# Patient Record
Sex: Female | Born: 1955
Health system: Southern US, Community
[De-identification: ages and names within clinical notes are randomized; demographics above are authoritative.]

## PROBLEM LIST (undated history)

## (undated) DIAGNOSIS — K8020 Calculus of gallbladder without cholecystitis without obstruction: Secondary | ICD-10-CM

## (undated) DIAGNOSIS — I10 Essential (primary) hypertension: Secondary | ICD-10-CM

## (undated) DIAGNOSIS — F329 Major depressive disorder, single episode, unspecified: Secondary | ICD-10-CM

## (undated) DIAGNOSIS — E785 Hyperlipidemia, unspecified: Secondary | ICD-10-CM

## (undated) DIAGNOSIS — I639 Cerebral infarction, unspecified: Secondary | ICD-10-CM

## (undated) DIAGNOSIS — G43909 Migraine, unspecified, not intractable, without status migrainosus: Secondary | ICD-10-CM

## (undated) DIAGNOSIS — E119 Type 2 diabetes mellitus without complications: Secondary | ICD-10-CM

## (undated) DIAGNOSIS — F32A Depression, unspecified: Secondary | ICD-10-CM

## (undated) HISTORY — DX: Type 2 diabetes mellitus without complications: E11.9

## (undated) HISTORY — DX: Migraine, unspecified, not intractable, without status migrainosus: G43.909

## (undated) HISTORY — DX: Hyperlipidemia, unspecified: E78.5

---

## 2009-01-17 ENCOUNTER — Other Ambulatory Visit: Admission: RE | Admit: 2009-01-17 | Discharge: 2009-01-17 | Payer: Self-pay | Admitting: Family Medicine

## 2013-04-06 DIAGNOSIS — I639 Cerebral infarction, unspecified: Secondary | ICD-10-CM

## 2013-04-06 HISTORY — DX: Cerebral infarction, unspecified: I63.9

## 2013-06-06 ENCOUNTER — Ambulatory Visit
Admission: RE | Admit: 2013-06-06 | Discharge: 2013-06-06 | Disposition: A | Discharge status: Home / Self Care | Payer: BC Managed Care – PPO | Source: Ambulatory Visit | Attending: Family Medicine | Admitting: Family Medicine

## 2013-06-06 ENCOUNTER — Other Ambulatory Visit: Payer: Self-pay | Admitting: Family Medicine

## 2013-06-06 ENCOUNTER — Other Ambulatory Visit (HOSPITAL_COMMUNITY): Payer: Self-pay | Admitting: Family Medicine

## 2013-06-06 DIAGNOSIS — I639 Cerebral infarction, unspecified: Secondary | ICD-10-CM

## 2013-06-06 DIAGNOSIS — I6350 Cerebral infarction due to unspecified occlusion or stenosis of unspecified cerebral artery: Secondary | ICD-10-CM

## 2013-06-06 DIAGNOSIS — R930 Abnormal findings on diagnostic imaging of skull and head, not elsewhere classified: Secondary | ICD-10-CM

## 2013-06-06 MED ORDER — GADOBENATE DIMEGLUMINE 529 MG/ML IV SOLN
12.0000 mL | Freq: Once | INTRAVENOUS | Status: AC | PRN
  Administered 2013-06-06: 12 mL via INTRAVENOUS

## 2013-06-07 ENCOUNTER — Encounter (INDEPENDENT_AMBULATORY_CARE_PROVIDER_SITE_OTHER): Payer: Self-pay

## 2013-06-07 ENCOUNTER — Ambulatory Visit (HOSPITAL_COMMUNITY): Payer: BC Managed Care – PPO

## 2013-06-07 ENCOUNTER — Ambulatory Visit (INDEPENDENT_AMBULATORY_CARE_PROVIDER_SITE_OTHER): Payer: BC Managed Care – PPO | Admitting: Neurology

## 2013-06-07 ENCOUNTER — Encounter: Payer: Self-pay | Admitting: Neurology

## 2013-06-07 ENCOUNTER — Encounter (INDEPENDENT_AMBULATORY_CARE_PROVIDER_SITE_OTHER): Payer: Self-pay | Admitting: Ophthalmology

## 2013-06-07 VITALS — BP 181/93 | HR 93 | Ht 60.0 in | Wt 135.0 lb

## 2013-06-07 DIAGNOSIS — G43909 Migraine, unspecified, not intractable, without status migrainosus: Secondary | ICD-10-CM | POA: Insufficient documentation

## 2013-06-07 DIAGNOSIS — E11319 Type 2 diabetes mellitus with unspecified diabetic retinopathy without macular edema: Secondary | ICD-10-CM | POA: Insufficient documentation

## 2013-06-07 DIAGNOSIS — I10 Essential (primary) hypertension: Secondary | ICD-10-CM

## 2013-06-07 DIAGNOSIS — E785 Hyperlipidemia, unspecified: Secondary | ICD-10-CM | POA: Insufficient documentation

## 2013-06-07 DIAGNOSIS — E119 Type 2 diabetes mellitus without complications: Secondary | ICD-10-CM

## 2013-06-07 MED ORDER — ATORVASTATIN CALCIUM 20 MG PO TABS: 20.0000 mg | ORAL_TABLET | Freq: Every day | ORAL | Status: DC

## 2013-06-07 MED ORDER — LISINOPRIL 5 MG PO TABS: 5.0000 mg | ORAL_TABLET | Freq: Every day | ORAL | Status: DC

## 2013-06-07 MED ORDER — ASPIRIN EC 81 MG PO TBEC: 81.0000 mg | DELAYED_RELEASE_TABLET | Freq: Every day | ORAL | Status: DC

## 2013-06-07 NOTE — Progress Notes (Signed)
PATIENT: Marie [name] DOB: Mar 05, 1956  HISTORICAL  Marie [name] is a right-handed 58 year old Asian female, accompanied by her husband, referred by her primary care physician Dr. Lanora Manis Barns for evaluation of acute onset language difficulty  In Feb 28th 2015, she was upset, while talking to her husbnad, she had sudden onset of difficulty speakings, speech was slurred, word finding difficulty, much improved next day.  But her husband felt even to now, few days later, she still has mild language difficulty. She denies right arm leg, weakness or sensory loss.  She has history of HTN, HLD, DM, but not complian with her medications, she also has history of depression, took cymbalta for a while, now she is not taking it anymore.  Most lab showed A1C 9.4, normal CBC, CMP, VitD.   We have reviewed MRI brain Left MCA territory acute infarction, roughly 1 x 2 cm cross-section, affects the left frontal operculum and underlying white matter near Broca's area. Possible subacute ischemia left frontal periventricular white matter; Chronic appearing infarcts of the bilateral cerebellar hemispheres,  right greater than left, as well as the right posterior temporal cortex. Hemorrhagic lacunar infarct right posterior putaminal. lacunar infarct right ventral medial thalamus,  lacunar infarct right parietal subcortical white matter. 22 x 26 x 16 mm left frontal extra-axial mass lesion, likely cystic meningioma.  MRA showed diffuse intracranial atherosclerotic disease disease. Most severe disease of both posterior cerebral arteries   REVIEW OF SYSTEMS: Full 14 system review of systems performed and notable only for incontinence, feeling hot, anxiety, depression, anxiety  ALLERGIES: No Known Allergies  HOME MEDICATIONS:  PAST MEDICAL HISTORY: Past Medical History  Diagnosis Date  . Diabetes   . Hyperlipemia   . Migraines     PAST SURGICAL HISTORY: Past Surgical History  Procedure Laterality Date    . Cesarean section      FAMILY HISTORY: Family History  Problem Relation Age of Onset  . Stomach cancer Mother   . Diabetes Maternal Grandfather   . High blood pressure Mother   . High blood pressure Maternal Grandfather     SOCIAL HISTORY:  History   Social History  . Marital Status: Married    Spouse Name: N/A    Number of Children: 3  . Years of Education: MBA   Occupational History    [employer],    Social History Main Topics  . Smoking status: Never Smoker   . Smokeless tobacco: Never Used  . Alcohol Use: 0.5 oz/week    1 drink(s) per week     Comment: Social  . Drug Use: No  . Sexual Activity: Not on file   Other Topics Concern  . Not on file   Social History Narrative   Patient lives at home with [name].   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       PHYSICAL EXAM   Filed Vitals:   06/07/13 0901  BP: 181/93  Pulse: 93  Height: 5' (1.524 m)  Weight: 135 lb (61.236 kg)    Body mass index is 26.37 kg/(m^2).   Generalized: In no acute distress  Neck: Supple, no carotid bruits   Cardiac: Regular rate rhythm  Pulmonary: Clear to auscultation bilaterally  Musculoskeletal: No deformity  Neurological examination  Mentation: Alert oriented to time, place, history taking, and causual conversation  Cranial nerve II-XII: Pupils were equal round reactive  to light. Extraocular movements were full.  Visual field were full on confrontational test. Bilateral fundi were sharp.  Facial sensation and strength were normal. Hearing was intact to finger rubbing bilaterally. Uvula tongue midline.  Head turning and shoulder shrug and were normal and symmetric.Tongue protrusion into cheek strength was normal.  Motor: Normal tone, bulk and strength.  Sensory: Intact to fine touch, pinprick, preserved vibratory sensation, and proprioception at toes.  Coordination:  Normal finger to nose, heel-to-shin bilaterally there was no truncal ataxia  Gait: Rising up from seated position without assistance, normal stance, without trunk ataxia, moderate stride, good arm swing, smooth turning, able to perform tiptoe, and heel walking without difficulty.   Romberg signs: Negative  Deep tendon reflexes: Brachioradialis 2/2, biceps 2/2, triceps 2/2, patellar 2/2, Achilles 2/2, plantar responses were flexor bilaterally.   DIAGNOSTIC DATA (LABS, IMAGING, TESTING) - I reviewed patient records, labs, notes, testing and imaging myself where available.   ASSESSMENT AND PLAN  Marie [name] is a 58 y.o. female with vascular risk factors of HTN, HLD, DM, poorly controlled presenting with acute left MCA branch stroke.  1. Complete evaluation with ECHO, US carotids. 2. ASA 81mg  daily. 3. RTC in 3 months  Time spend in reviewing film, emphasize the importance of compliance with medication treatment.  Levert FeinsteinYijun Hettie Roselli, M.D. Ph.D.  [facility] [address], [address] [place], [place] [address] [phone]

## 2013-06-09 ENCOUNTER — Encounter: Payer: Self-pay | Admitting: Cardiology

## 2013-06-09 DIAGNOSIS — Z8673 Personal history of transient ischemic attack (TIA), and cerebral infarction without residual deficits: Secondary | ICD-10-CM

## 2013-06-09 DIAGNOSIS — I119 Hypertensive heart disease without heart failure: Secondary | ICD-10-CM

## 2013-06-12 ENCOUNTER — Encounter: Payer: Self-pay | Admitting: Cardiology

## 2013-06-12 DIAGNOSIS — Z8673 Personal history of transient ischemic attack (TIA), and cerebral infarction without residual deficits: Secondary | ICD-10-CM | POA: Insufficient documentation

## 2013-06-12 DIAGNOSIS — I672 Cerebral atherosclerosis: Secondary | ICD-10-CM | POA: Insufficient documentation

## 2013-06-14 ENCOUNTER — Ambulatory Visit (HOSPITAL_COMMUNITY)
Admission: RE | Admit: 2013-06-14 | Discharge: 2013-06-14 | Disposition: A | Discharge status: Home / Self Care | Payer: BC Managed Care – PPO | Source: Ambulatory Visit | Attending: Vascular Surgery | Admitting: Vascular Surgery

## 2013-06-14 ENCOUNTER — Other Ambulatory Visit (HOSPITAL_COMMUNITY): Payer: Self-pay | Admitting: Cardiology

## 2013-06-14 DIAGNOSIS — I739 Peripheral vascular disease, unspecified: Principal | ICD-10-CM

## 2013-06-14 DIAGNOSIS — I779 Disorder of arteries and arterioles, unspecified: Secondary | ICD-10-CM

## 2013-06-29 ENCOUNTER — Encounter (INDEPENDENT_AMBULATORY_CARE_PROVIDER_SITE_OTHER): Payer: BC Managed Care – PPO | Admitting: Ophthalmology

## 2013-06-29 DIAGNOSIS — E1139 Type 2 diabetes mellitus with other diabetic ophthalmic complication: Secondary | ICD-10-CM

## 2013-06-29 DIAGNOSIS — H2510 Age-related nuclear cataract, unspecified eye: Secondary | ICD-10-CM

## 2013-06-29 DIAGNOSIS — E11319 Type 2 diabetes mellitus with unspecified diabetic retinopathy without macular edema: Secondary | ICD-10-CM

## 2013-06-29 DIAGNOSIS — H43819 Vitreous degeneration, unspecified eye: Secondary | ICD-10-CM

## 2013-06-29 DIAGNOSIS — I10 Essential (primary) hypertension: Secondary | ICD-10-CM

## 2013-06-29 DIAGNOSIS — H35379 Puckering of macula, unspecified eye: Secondary | ICD-10-CM

## 2013-06-29 DIAGNOSIS — E1165 Type 2 diabetes mellitus with hyperglycemia: Secondary | ICD-10-CM

## 2013-06-29 DIAGNOSIS — H35039 Hypertensive retinopathy, unspecified eye: Secondary | ICD-10-CM

## 2013-07-06 ENCOUNTER — Other Ambulatory Visit (INDEPENDENT_AMBULATORY_CARE_PROVIDER_SITE_OTHER): Payer: BC Managed Care – PPO | Admitting: Ophthalmology

## 2013-07-06 DIAGNOSIS — E1165 Type 2 diabetes mellitus with hyperglycemia: Secondary | ICD-10-CM

## 2013-07-06 DIAGNOSIS — H3581 Retinal edema: Secondary | ICD-10-CM

## 2013-07-06 DIAGNOSIS — E1139 Type 2 diabetes mellitus with other diabetic ophthalmic complication: Secondary | ICD-10-CM

## 2013-07-20 ENCOUNTER — Encounter: Payer: Self-pay | Admitting: Cardiology

## 2013-07-20 NOTE — Progress Notes (Signed)
Patient ID: Marie [name], female   DOB: 08-14-1955, 58 y.o.   MRN: [id number]   Marie [name], Marie [name]    Date of visit:  07/20/2013 DOB:  005-01-1956    Age:  58 yrs. Medical record number:  [id number]     Account number:  [id number] Primary Care Provider: [facility] ____________________________ CURRENT DIAGNOSES  1. Hypertensive Heart Disease-Benign without CHF  2. Carotid Artery Stenosis  3. Stroke (CVA)  4. Diabetes Mellitus-NIDD  5. Hyperlipidemia ____________________________ ALLERGIES  Imitrex, Shortness of breath  Midrin, Nausea ____________________________ MEDICATIONS  1. multivitamin tablet, 1 p.o. daily  2. Vitamin D3 1,000 unit tablet, 1 p.o. daily  3. vitamin E 400 unit capsule, 1 p.o. daily  4. Fish Oil Concentrate 1,000 mg capsule, 1 p.o. daily  5. glimepiride 2 mg tablet, 1 1/2 tab qd  6. aspirin 81 mg chewable tablet, 1 p.o. daily  7. lisinopril 5 mg tablet, 1 p.o. daily  8. glimepiride 2 mg tablet, 1 p.o. daily  9. herbal drugs tablet, ginko balbo 1 qd 500mg   10. herbal drugs capsule, acetyla l caratine 500mg  qd  11. herbal drugs capsule, phosphadyl  200mg  2 qd  12. atorvastatin 40 mg tablet, 1 p.o. daily ____________________________ CHIEF COMPLAINTS  F/u after event monitor  Followup of Essential hypertension ____________________________ HISTORY OF PRESENT ILLNESS  Patient returns for cardiac followup. She had an echocardiogram that showed LVH with normal systolic function. Grade 1 diastolic dysfunction, and a small atrial septal aneurysm. She has had an extensive workup otherwise and wore a cardiac event monitor that did not show atrial fibrillation. She did not want to wear a implantable monitor. She continues to have a number of questions about why she had a stroke. She had evidence of intracranial vascular disease as well as some nonobstructive carotid artery disease. She is diabetic, hypertensive, and has hyperlipidemia. She has some visual complaints and has had laser  treatments of her eyes as well as some intermittent dizziness. ____________________________ PAST HISTORY  Past Medical Illnesses:  CVA with deficits, hypertension, DM-non-insulin dependent, hyperlipidemia;  Cardiovascular Illnesses:  Carotid artery disease;  Surgical Procedures:  cesarean section;  Cardiology Procedures-Invasive:  no history of prior cardiac procedures;  Cardiology Procedures-Noninvasive:  event monitor March 2015, echocardiogram March 2015;  LVEF of 65% documented via echocardiogram on 06/21/2013,   ____________________________ CARDIO-PULMONARY TEST DATES EKG Date:  06/09/2013;  Holter/Event Monitor Date: 06/10/2013;  Echocardiography Date: 06/21/2013;   ____________________________ FAMILY HISTORY Father -- Father dead, Myocardial infarction Mother -- Mother dead, Hypertension, Stomach cancer, Diabetes mellitus ____________________________ SOCIAL HISTORY Alcohol Use:  occasionally;  Smoking:  never smoked;  Diet:  regular diet;  Lifestyle:  married;  Exercise:  walking for approximately 10 minutes 7 days per week;  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 Eyes: recent lasar treatments Respiratory: denies dyspnea, cough, wheezing or hemoptysis. Cardiovascular:  please review HPI Abdominal: denies dyspepsia, GI bleeding, constipation, or diarrhea Musculoskeletal:  denies arthritis, venous insufficiency, or muscle weakness Neurological:  see HPI Psychiatric:  anxiety  ____________________________ PHYSICAL EXAMINATION VITAL SIGNS  Blood Pressure:  124/70 Sitting, Left arm, regular cuff  , 128/70 Standing, Left arm and regular cuff   Pulse:  84/min. Weight:  135.00 lbs. Height:  62.50"BMI: 24  Constitutional:  pleasant [place] female, in no acute distress Skin:  warm and dry to touch, no apparent skin lesions, or masses noted. Head:  normocephalic, normal hair pattern, no masses or tenderness Neck:  supple, without massess. No JVD, thyromegaly or carotid bruits. Carotid upstroke normal. Chest:  normal symmetry, clear to auscultation. Cardiac:  regular rhythm, normal S1 and S2, No S3 or S4, no murmurs, gallops or rubs detected. Peripheral Pulses:  the femoral,dorsalis pedis, and posterior tibial pulses are full and equal bilaterally with no bruits auscultated. Extremities & Back:  no deformities, clubbing, cyanosis, erythema or edema observed. Normal muscle strength and tone. Neurological:  no gross motor or sensory deficits noted, affect appropriate, oriented x3. ____________________________ MOST RECENT LIPID PANEL 03/15/13  CHOL TOTL 156 mg/dl, LDL 84 NM, HDL 51 mg/dl, TRIGLYCER 161104 mg/dl and CHOL/HDL 3.1 (Calc) ____________________________ IMPRESSIONS/PLAN  1. Evidence of multiple embolic strokes but no atrial fibrillation noted on event monitoring. 2. Diabetes mellitus with retinal complications 3. Hypertension 4. Hyperlipidemia  Recommendations:  Long discussion with patient and husband. Recommended that she have a TEE to look for a PFO in light of atrial septal aneurysm. Followup after the TEE. Advised followup with neurologist as they have a number of questions about her previous strokes. ____________________________ TODAYS ORDERS  1. Return Visit: 6 weeks                       ____________________________ Cardiology Physician:  Darden PalmerW. Spencer Dnaiel Voller, Jr. MD [facility]

## 2013-07-20 NOTE — Progress Notes (Unsigned)
Patient ID: Jeneen RinksFarida Crammer, female   DOB: Feb 08, 1956, 58 y.o.   MRN: [id number]   Jeneen Rinkszam, Carnell    Date of visit:  06/09/2013 DOB:  0Nov 04, 1957    Age:  58 yrs. Medical record number:  [id number]     Account number:  [id number] [facility] ____________________________ CURRENT DIAGNOSES  1. Stroke (CVA)  2. Diabetes Mellitus-NIDD  3. Hyperlipidemia  4. Essential hypertension ____________________________ ALLERGIES  Imitrex, Shortness of breath  Midrin, Nausea ____________________________ MEDICATIONS  1. multivitamin tablet, 1 p.o. daily  2. Vitamin D3 1,000 unit tablet, 1 p.o. daily  3. vitamin E 400 unit capsule, 1 p.o. daily  4. Fish Oil Concentrate 1,000 mg capsule, 1 p.o. daily  5. glimepiride 2 mg tablet, 1 1/2 tab qd  6. aspirin 81 mg chewable tablet, 1 p.o. daily  7. atorvastatin 20 mg tablet, 1 p.o. daily  8. lisinopril 5 mg tablet, 1 p.o. daily ____________________________ CHIEF COMPLAINTS Recent stroke, evaluate for cardiac source ____________________________ HISTORY OF PRESENT ILLNESS This very nice 58 year old female is seen at the request of Dr. Zachery DauerBarnes for cardiology evaluation in the setting of a recent stroke. The patient has a prior history of diabetes mellitus as well as hypertension and hyperlipidemia. She recently developed difficulty with speech and was found to have a left middle cerebral artery infarct within the past week. Her speech improved and she eventually was seen by the neurologist. She had an MRA that showed a small meningioma as well as the infarct in the left MCA distribution and had evidence of infarction in both lobes of the cerebellum, the posterior cortex and some other places. There was also evidence of cerebrovascular disease with 75% stenosis of the left MCA, 50% stenosis of the right MCA and severe disease of the posterior cerebral arteries. She saw the neurologist and is currently treated with aspirin. She denies angina and has no  PND, orthopnea or edema. She denies any palpitations and has never been diagnosed with arrhythmia in the past. She does note over the years that she has occasional unsteadiness in her gait. ____________________________ PAST HISTORY  Past Medical Illnesses:  CVA with deficits, hypertension, DM-non-insulin dependent, hyperlipidemia;  Cardiovascular Illnesses:  Carotid artery disease;  Surgical Procedures:  cesarean section;  Cardiology Procedures-Invasive:  no history of prior cardiac procedures;  Cardiology Procedures-Noninvasive:  no previous non-invasive procedures;  LVEF not documented,   ____________________________ CARDIO-PULMONARY TEST DATES EKG Date:  06/09/2013;   ____________________________ FAMILY HISTORY Father -- Father dead, Myocardial infarction Mother -- Mother dead, Hypertension, Stomach cancer, Diabetes mellitus ____________________________ SOCIAL HISTORY Alcohol Use:  occasionally;  Smoking:  never smoked;  Diet:  regular diet;  Lifestyle:  married;  Exercise:  walking for approximately 10 minutes 7 days per week;  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 Eyes: denies diplopia, history of glaucoma or visual problems. Ears, Nose, Throat, Mouth:  denies any hearing loss, epistaxis, hoarseness or difficulty speaking. Respiratory: denies dyspnea, cough, wheezing or hemoptysis. Cardiovascular:  please review HPI Abdominal: denies dyspepsia, GI bleeding, constipation, or diarrheaGenitourinary-Female: no dysuria, urgency, frequency, UTIs, or stress incontinence Musculoskeletal:  denies arthritis, venous insufficiency, or muscle weakness Neurological:  see HPI Psychiatric:  anxiety  ____________________________ PHYSICAL EXAMINATION VITAL SIGNS  Blood Pressure:  174/80 Standing, Right arm, regular cuff  , 170/80 Standing, Left arm and  regular cuff   Pulse:  88/min. Weight:  137.00 lbs. Height:  62.5"BMI: 24  Constitutional:  pleasant [place] appearing female, in no acute distress Skin:  warm and dry to touch, no apparent skin lesions, or masses noted. Head:  normocephalic, normal hair pattern, no masses or tenderness Eyes:  EOMS Intact, PERRLA, C and S clear, Funduscopic exam not done. ENT:  ears, nose and throat reveal no gross abnormalities.  Dentition good. Neck:  supple, without massess. No JVD, thyromegaly or carotid bruits. Carotid upstroke normal. Chest:  normal symmetry, clear to auscultation. Cardiac:  regular rhythm, normal S1 and S2, No S3 or S4, no murmurs, gallops or rubs detected. Abdomen:  abdomen soft,non-tender, no masses, no hepatospenomegaly, or aneurysm noted Peripheral Pulses:  the femoral,dorsalis pedis, and posterior tibial pulses are full and equal bilaterally with no bruits auscultated. Extremities & Back:  no deformities, clubbing, cyanosis, erythema or edema observed. Normal muscle strength and tone. Neurological:  no gross motor or sensory deficits noted, affect appropriate, oriented x3. ____________________________ MOST RECENT LIPID PANEL 03/15/13  CHOL TOTL 156 mg/dl, LDL 84 NM, HDL 51 mg/dl, TRIGLYCER 409104 mg/dl and CHOL/HDL 3.1 (Calc) ____________________________ IMPRESSIONS/PLAN  1. Recent left MCA infarct with evidence of multiple infarcts in the brain 2. Hypertension with blood pressure not well controlled today 3. Diabetes mellitus 4. Hyperlipidemia 5. Evidence of intracranial arterial disease  Recommendations:  1. We will obtain an echocardiogram to look for cardiac sources for cerebral emboli. 2. Obtain carotid Dopplers 3. We discussed the potential of atrial fibrillation and multiple infarctions. We discussed the options of wearing a 30 day event monitor versus having an implantable loop recorder. She would prefer at this time to wear the 30 day monitor and we can go from  there. We talked about the potential for change in anticoagulation of atrial fibrillation is demonstrated.  4. Because she has a prior stroke and definite vascular disease irecommended that she  increase her Lipitor to 40 mg daily for the time being. 5. I recommended that they obtain an Omron blood pressure cuff and monitor blood pressures at home as her blood pressure was elevated in the office today. She may need more intensive antihypertensive therapy if her blood pressure remains elevated. ____________________________ TODAYS ORDERS  1. 12 Lead EKG: Today  2. 2D, color flow, doppler: First Available  3. Carotid Duplex: First Available  4. [place]: 1 Day  5. Return Visit: 1 month                       ____________________________ Cardiology Physician:  Darden PalmerW. Spencer Khloee Garza, Jr. MD [facility]

## 2013-08-08 ENCOUNTER — Ambulatory Visit (INDEPENDENT_AMBULATORY_CARE_PROVIDER_SITE_OTHER): Payer: BC Managed Care – PPO | Admitting: Neurology

## 2013-08-08 ENCOUNTER — Encounter: Payer: Self-pay | Admitting: Neurology

## 2013-08-08 VITALS — BP 141/76 | HR 74

## 2013-08-08 DIAGNOSIS — E785 Hyperlipidemia, unspecified: Secondary | ICD-10-CM

## 2013-08-08 DIAGNOSIS — G43909 Migraine, unspecified, not intractable, without status migrainosus: Secondary | ICD-10-CM

## 2013-08-08 DIAGNOSIS — I10 Essential (primary) hypertension: Secondary | ICD-10-CM

## 2013-08-08 DIAGNOSIS — Z8673 Personal history of transient ischemic attack (TIA), and cerebral infarction without residual deficits: Secondary | ICD-10-CM

## 2013-08-08 DIAGNOSIS — I672 Cerebral atherosclerosis: Secondary | ICD-10-CM

## 2013-08-08 DIAGNOSIS — E119 Type 2 diabetes mellitus without complications: Secondary | ICD-10-CM

## 2013-08-08 MED ORDER — LAMOTRIGINE 100 MG PO TABS: 100.0000 mg | ORAL_TABLET | Freq: Two times a day (BID) | ORAL | Status: DC

## 2013-08-08 MED ORDER — LAMOTRIGINE 25 MG PO TABS: ORAL_TABLET | ORAL | Status: DC
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
## 2013-08-09 ENCOUNTER — Other Ambulatory Visit (INDEPENDENT_AMBULATORY_CARE_PROVIDER_SITE_OTHER): Payer: BC Managed Care – PPO | Admitting: Radiology

## 2013-08-15 ENCOUNTER — Ambulatory Visit (INDEPENDENT_AMBULATORY_CARE_PROVIDER_SITE_OTHER): Payer: BC Managed Care – PPO | Admitting: Radiology

## 2013-08-15 DIAGNOSIS — E785 Hyperlipidemia, unspecified: Secondary | ICD-10-CM

## 2013-08-15 DIAGNOSIS — E119 Type 2 diabetes mellitus without complications: Secondary | ICD-10-CM

## 2013-08-15 DIAGNOSIS — I10 Essential (primary) hypertension: Secondary | ICD-10-CM

## 2013-08-15 DIAGNOSIS — I672 Cerebral atherosclerosis: Secondary | ICD-10-CM

## 2013-08-15 DIAGNOSIS — Z8673 Personal history of transient ischemic attack (TIA), and cerebral infarction without residual deficits: Secondary | ICD-10-CM

## 2013-08-15 DIAGNOSIS — G43909 Migraine, unspecified, not intractable, without status migrainosus: Secondary | ICD-10-CM

## 2013-08-16 ENCOUNTER — Ambulatory Visit (INDEPENDENT_AMBULATORY_CARE_PROVIDER_SITE_OTHER): Payer: BC Managed Care – PPO

## 2013-08-16 DIAGNOSIS — I672 Cerebral atherosclerosis: Secondary | ICD-10-CM

## 2013-08-16 DIAGNOSIS — E119 Type 2 diabetes mellitus without complications: Secondary | ICD-10-CM

## 2013-08-16 DIAGNOSIS — I10 Essential (primary) hypertension: Secondary | ICD-10-CM

## 2013-08-16 DIAGNOSIS — E785 Hyperlipidemia, unspecified: Secondary | ICD-10-CM

## 2013-08-16 DIAGNOSIS — G43909 Migraine, unspecified, not intractable, without status migrainosus: Secondary | ICD-10-CM

## 2013-08-16 DIAGNOSIS — Z8673 Personal history of transient ischemic attack (TIA), and cerebral infarction without residual deficits: Secondary | ICD-10-CM

## 2013-08-17 ENCOUNTER — Telehealth: Payer: Self-pay | Admitting: *Deleted

## 2013-08-17 ENCOUNTER — Telehealth: Payer: Self-pay | Admitting: Neurology

## 2013-08-17 NOTE — Telephone Encounter (Signed)
I have called her,  Abnormal MRI brain (without) demonstrating:  1. Acute and subacute right inferior cerebellar ischemic infarctions, adjacent to cystic encephalomalacia from chronic right cerebellar infarcts.  2. Additional chronic ischemic disease: Small chronic infarcts noted in the left cerebellum. Chronic ischemic infarcts in the right posterior temporal and left opercular-frontal cortical regions. Mild scattered chronic small vessel ischemic disease in the subcortical and periventricular white matter.  3. There is a stable left anterior frontal extra-axial dural based multi-cystic lesion, likely a cystic meningioma (2.2x1.6x1.8cm, APxtransxSI).  4. Compared to prior MRI from 06/06/13, there are new acute infarcts in the right cerebellum.  5. The overall pattern of infarcts is suspicious for cardio-aortic embolic etiology    She already had cardiac monitor for one month, done in April no abnormality found  She had an echocardiogram that showed LVH with normal systolic function. Grade 1 diastolic dysfunction, and a small atrial septal aneurysm. She has had an extensive workup otherwise and wore a cardiac event monitor that did not show atrial fibrillation. She did not want to wear a implantable monitor  I have reviewed film together with Dr. Durene FruitsP/S, agree embolic events.  Also talked about research trial, [employer], Amy/Wes will contact her for detail.

## 2013-08-17 NOTE — Telephone Encounter (Signed)
Dana:  Please call patient that she has appt with us in June 5th, if she can, try to have TEE done before that. Otherwise, we will move her appt till she had TEE.

## 2013-08-18 ENCOUNTER — Telehealth: Payer: Self-pay | Admitting: Neurology

## 2013-08-18 MED ORDER — CLOPIDOGREL BISULFATE 75 MG PO TABS: 75.0000 mg | ORAL_TABLET | Freq: Every day | ORAL | Status: DC

## 2013-08-18 NOTE — Telephone Encounter (Signed)
I have called her, she should have TEE by Dr. Donnie Ahoilley as previously planned. Now she is complaining of right hand numbness, right hand weakness, difficulty for her to sign her name. May indicate another some left hemisphere/left thalamus small stroke.  I also called in plavix 75 mg qday. She should take it along with asa 81mg  qday.  Keep follow up app in June 5th

## 2013-08-18 NOTE — Telephone Encounter (Signed)
Will address questions at follow-up visit.

## 2013-08-18 NOTE — Telephone Encounter (Signed)
Pt's husband called stating pt is having numbness/tingling in both arms but mostly in right arm. Also they want to know if the MRI results also all scanning's on pt can be faxed to [facility] Florentina Addison[facility]ajjadul Islam [facility] [phone]. Thanks

## 2013-08-18 NOTE — Telephone Encounter (Signed)
Called pt to inform her per Dr. Terrace ArabiaYan that the doctor will address all questions at next f/u appt and I advised the pt that a release form has to be filled out or the doctor's office can request the information. I advised the pt that if she has any other problems, questions or concerns to call the office. Pt verbalized understanding.

## 2013-08-22 ENCOUNTER — Encounter (HOSPITAL_COMMUNITY): Payer: Self-pay | Admitting: Pharmacy Technician

## 2013-08-23 ENCOUNTER — Telehealth: Payer: Self-pay | Admitting: *Deleted

## 2013-08-23 DIAGNOSIS — G43909 Migraine, unspecified, not intractable, without status migrainosus: Secondary | ICD-10-CM

## 2013-08-23 DIAGNOSIS — I10 Essential (primary) hypertension: Secondary | ICD-10-CM

## 2013-08-23 DIAGNOSIS — E119 Type 2 diabetes mellitus without complications: Secondary | ICD-10-CM

## 2013-08-23 DIAGNOSIS — I672 Cerebral atherosclerosis: Secondary | ICD-10-CM

## 2013-08-23 DIAGNOSIS — Z8673 Personal history of transient ischemic attack (TIA), and cerebral infarction without residual deficits: Secondary | ICD-10-CM

## 2013-08-23 DIAGNOSIS — E785 Hyperlipidemia, unspecified: Secondary | ICD-10-CM

## 2013-08-23 NOTE — Telephone Encounter (Signed)
Called Marie [name] and Marie [name] stated that she would like for Dr. Terrace ArabiaYan to give her a call back concerning her FMLA or maybe short term disability. Marie [name] stated that she would like to talk to Dr. Terrace ArabiaYan about this to see what are her options. Please call the Marie [name]. Thanks
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
## 2013-08-23 NOTE — Telephone Encounter (Signed)
I have called her, she still has balance issues, TEE in Aug 24 2013, she is applying short term disability form

## 2013-08-24 ENCOUNTER — Encounter (HOSPITAL_COMMUNITY)
Admission: RE | Disposition: A | Discharge status: Home / Self Care | Payer: Self-pay | Source: Ambulatory Visit | Attending: Cardiovascular Disease

## 2013-08-24 ENCOUNTER — Telehealth: Payer: Self-pay | Admitting: Neurology

## 2013-08-24 ENCOUNTER — Encounter (HOSPITAL_COMMUNITY): Payer: Self-pay | Admitting: *Deleted

## 2013-08-24 ENCOUNTER — Ambulatory Visit (HOSPITAL_COMMUNITY)
Admission: RE | Admit: 2013-08-24 | Discharge: 2013-08-24 | Disposition: A | Discharge status: Home / Self Care | Payer: BC Managed Care – PPO | Source: Ambulatory Visit | Attending: Cardiovascular Disease | Admitting: Cardiovascular Disease

## 2013-08-24 DIAGNOSIS — F411 Generalized anxiety disorder: Secondary | ICD-10-CM | POA: Insufficient documentation

## 2013-08-24 DIAGNOSIS — Z9119 Patient's noncompliance with other medical treatment and regimen: Secondary | ICD-10-CM | POA: Insufficient documentation

## 2013-08-24 DIAGNOSIS — F3289 Other specified depressive episodes: Secondary | ICD-10-CM | POA: Insufficient documentation

## 2013-08-24 DIAGNOSIS — Z91199 Patient's noncompliance with other medical treatment and regimen due to unspecified reason: Secondary | ICD-10-CM | POA: Insufficient documentation

## 2013-08-24 DIAGNOSIS — Z8673 Personal history of transient ischemic attack (TIA), and cerebral infarction without residual deficits: Secondary | ICD-10-CM | POA: Insufficient documentation

## 2013-08-24 DIAGNOSIS — I700 Atherosclerosis of aorta: Secondary | ICD-10-CM | POA: Insufficient documentation

## 2013-08-24 DIAGNOSIS — E119 Type 2 diabetes mellitus without complications: Secondary | ICD-10-CM | POA: Insufficient documentation

## 2013-08-24 DIAGNOSIS — I6350 Cerebral infarction due to unspecified occlusion or stenosis of unspecified cerebral artery: Secondary | ICD-10-CM

## 2013-08-24 DIAGNOSIS — I10 Essential (primary) hypertension: Secondary | ICD-10-CM | POA: Insufficient documentation

## 2013-08-24 DIAGNOSIS — E785 Hyperlipidemia, unspecified: Secondary | ICD-10-CM | POA: Insufficient documentation

## 2013-08-24 DIAGNOSIS — F329 Major depressive disorder, single episode, unspecified: Secondary | ICD-10-CM | POA: Insufficient documentation

## 2013-08-24 HISTORY — PX: TEE WITHOUT CARDIOVERSION: SHX5443

## 2013-08-24 LAB — GLUCOSE, CAPILLARY: Glucose-Capillary: 114 mg/dL — ABNORMAL HIGH (ref 70–99)

## 2013-08-24 SURGERY — ECHOCARDIOGRAM, TRANSESOPHAGEAL
Anesthesia: Moderate Sedation

## 2013-08-24 MED ORDER — DIPHENHYDRAMINE HCL 50 MG/ML IJ SOLN
INTRAMUSCULAR | Status: AC
  Filled 2013-08-24: qty 1

## 2013-08-24 MED ORDER — MIDAZOLAM HCL 5 MG/ML IJ SOLN
INTRAMUSCULAR | Status: AC
  Filled 2013-08-24: qty 2

## 2013-08-24 MED ORDER — MIDAZOLAM HCL 10 MG/2ML IJ SOLN
INTRAMUSCULAR | Status: DC | PRN
  Administered 2013-08-24: 1 mg via INTRAVENOUS
  Administered 2013-08-24: 2 mg via INTRAVENOUS

## 2013-08-24 MED ORDER — BUTAMBEN-TETRACAINE-BENZOCAINE 2-2-14 % EX AERO
INHALATION_SPRAY | CUTANEOUS | Status: DC | PRN
  Administered 2013-08-24: 2 via TOPICAL

## 2013-08-24 MED ORDER — FENTANYL CITRATE 0.05 MG/ML IJ SOLN
INTRAMUSCULAR | Status: AC
  Filled 2013-08-24: qty 2

## 2013-08-24 MED ORDER — FENTANYL CITRATE 0.05 MG/ML IJ SOLN
INTRAMUSCULAR | Status: DC | PRN
  Administered 2013-08-24 (×2): 25 ug via INTRAVENOUS

## 2013-08-24 MED ORDER — SODIUM CHLORIDE 0.9 % IV SOLN
INTRAVENOUS | Status: DC
  Administered 2013-08-24: 500 mL via INTRAVENOUS

## 2013-08-24 NOTE — Telephone Encounter (Signed)
Patient called back to verify appt on 6/5.  She's needing appt before June 5th due to going out of country for 3 months.  Please call and advise.

## 2013-08-24 NOTE — Op Note (Signed)
INDICATIONS: stroke  PROCEDURE:   Informed consent was obtained prior to the procedure. The risks, benefits and alternatives for the procedure were discussed and the patient comprehended these risks.  Risks include, but are not limited to, cough, sore throat, vomiting, nausea, somnolence, esophageal and stomach trauma or perforation, bleeding, low blood pressure, aspiration, pneumonia, infection, trauma to the teeth and death.    After a procedural time-out, the oropharynx was anesthetized with 20% benzocaine spray. The patient was given 3 mg versed and 50 mcg fentanyl for moderate sedation.   The transesophageal probe was inserted in the esophagus and stomach without difficulty and multiple views were obtained.  The patient was kept under observation until the patient left the procedure room.  The patient left the procedure room in stable condition.   Agitated microbubble saline contrast was administered.  COMPLICATIONS:    There were no immediate complications.  FINDINGS:  No cardiac source of embolism. No intracardiac shunt. Mild aortic atherosclerosis.  RECOMMENDATIONS:   Consider event monitor/loop recorder.  Time Spent Directly with the Patient:  45 minutes   Kailly Richoux 08/24/2013, 9:45 AM

## 2013-08-24 NOTE — Telephone Encounter (Signed)
Marie [name]: If you can get her TEE report from her cardiologist Dr. Karie Schwalbe. Marie [name], I can see her in my next avaialble
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
## 2013-08-24 NOTE — Interval H&P Note (Signed)
History and Physical Interval Note:  08/24/2013 9:44 AM  Marie RinksFarida Hallowell  has presented today for surgery, with the diagnosis of STROKE  The various methods of treatment have been discussed with the patient and family. After consideration of risks, benefits and other options for treatment, the patient has consented to  Procedure(s): TRANSESOPHAGEAL ECHOCARDIOGRAM (TEE) (N/A) as a surgical intervention .  The patient's history has been reviewed, patient examined, no change in status, stable for surgery.  I have reviewed the patient's chart and labs.  Questions were answered to the patient's satisfaction.     Marcel Sorter

## 2013-08-24 NOTE — Discharge Instructions (Signed)
Conscious Sedation, Adult, Care After °Refer to this sheet in the next few weeks. These instructions provide you with information on caring for yourself after your procedure. Your health care provider may also give you more specific instructions. Your treatment has been planned according to current medical practices, but problems sometimes occur. Call your health care provider if you have any problems or questions after your procedure. °WHAT TO EXPECT AFTER THE PROCEDURE  °After your procedure: °· You may feel sleepy, clumsy, and have poor balance for several hours. °· Vomiting may occur if you eat too soon after the procedure. °HOME CARE INSTRUCTIONS °· Do not participate in any activities where you could become injured for at least 24 hours. Do not: °· Drive. °· Swim. °· Ride a bicycle. °· Operate heavy machinery. °· Cook. °· Use power tools. °· Climb ladders. °· Work from a high place. °· Do not make important decisions or sign legal documents until you are improved. °· If you vomit, drink water, juice, or soup when you can drink without vomiting. Make sure you have little or no nausea before eating solid foods. °· Only take over-the-counter or prescription medicines for pain, discomfort, or fever as directed by your health care provider. °· Make sure you and your family fully understand everything about the medicines given to you, including what side effects may occur. °· You should not drink alcohol, take sleeping pills, or take medicines that cause drowsiness for at least 24 hours. °· If you smoke, do not smoke without supervision. °· If you are feeling better, you may resume normal activities 24 hours after you were sedated. °· Keep all appointments with your health care provider. °SEEK MEDICAL CARE IF: °· Your skin is pale or bluish in color. °· You continue to feel nauseous or vomit. °· Your pain is getting worse and is not helped by medicine. °· You have bleeding or swelling. °· You are still sleepy or  feeling clumsy after 24 hours. °SEEK IMMEDIATE MEDICAL CARE IF: °· You develop a rash. °· You have difficulty breathing. °· You develop any type of allergic problem. °· You have a fever. °MAKE SURE YOU: °· Understand these instructions. °· Will watch your condition. °· Will get help right away if you are not doing well or get worse. °Document Released: 01/11/2013 Document Reviewed: 10/28/2012 °ExitCare® Patient Information ©2014 [employer]. ° ° °Transesophageal Echocardiography °Transesophageal echocardiography (TEE) is a picture test of your heart using sound waves. The pictures taken can give very detailed pictures of your heart. This can help your doctor see if there are problems with your heart. TEE can check: °· If your heart has blood clots in it. °· How well your heart valves are working. °· If you have an infection on the inside of your heart. °· Some of the major arteries of your heart. °· If your heart valve is working after a repair. °· Your heart before a procedure that uses a shock to your heart to get the rhythm back to normal. °BEFORE THE PROCEDURE °· Do not eat or drink for 6 hours before the procedure or as told by your doctor. °· Make plans to have someone drive you home after the procedure. Do not drive yourself home. °· An IV tube will be put in your arm. °PROCEDURE °· You will be given a medicine to help you relax (sedative). It will be given through the IV tube. °· A numbing medicine will be sprayed in the back of your throat   to help numb it. °· The tip of the probe is placed into the back of your mouth. You will be asked to swallow. This helps to pass the probe into your esophagus. °· Once the tip of the probe is in the right place, your doctor can take pictures of your heart. °· You may feel pressure at the back of your throat. °AFTER THE PROCEDURE °· You will be taken to a recovery area so the sedative can wear off. °· Your throat may be sore and scratchy. This will go away slowly over  time. °· You will go home when you are fully awake and able to swallow liquids. °· You should have someone stay with you for the next 24 hours. °Document Released: 01/18/2009 Document Revised: 01/11/2013 Document Reviewed: 09/22/2012 °ExitCare® Patient Information ©2014 [employer]. ° °

## 2013-08-24 NOTE — Telephone Encounter (Signed)
Patient calling to inform Dr Terrace ArabiaYan, she had endoscopy performed today at 10:30. She's also questioning the status of FMLA paperwork.

## 2013-08-24 NOTE — Progress Notes (Signed)
*  PRELIMINARY RESULTS* Echocardiogram Echocardiogram Transesophageal has been performed.  Marie PullerJohanna R Biddie [name] 08/24/2013, 11:11 AM

## 2013-08-24 NOTE — Telephone Encounter (Signed)
Called pt and left message informing pt that pt's FMLA papers were being worked on and it take up to 14 days to complete and if pt could call back around 08/29/13 to inquire about her form. I advised the pt that if she has any other problems, questions or concerns to call the office.

## 2013-08-24 NOTE — Telephone Encounter (Signed)
Pt calling requesting to come in earlier than appt on 09/08/13 because pt states that she is going out of the country for 3 months. Please advise

## 2013-08-25 ENCOUNTER — Encounter (HOSPITAL_COMMUNITY): Payer: Self-pay | Admitting: Cardiovascular Disease

## 2013-08-29 NOTE — Telephone Encounter (Signed)
Pt here for FMLA.  I spoke to her and filled out FMLA form/ signed by Dr. Terrace Arabia.  Pt has appt 09-08-13 and will p/u copy of this as well as MR.  She asked for wheelchair and cane.  Per Dr. Terrace Arabia, cane prescription given to her.   Will discuss wheelchair when in for RV 09-08-13.  I called pt and relayed that did fax FMLA to Corey Harold, [employer] [phone] with successful fax transmission.

## 2013-08-31 ENCOUNTER — Encounter: Payer: Self-pay | Admitting: Cardiology

## 2013-08-31 DIAGNOSIS — I119 Hypertensive heart disease without heart failure: Secondary | ICD-10-CM | POA: Insufficient documentation

## 2013-08-31 NOTE — Progress Notes (Unsigned)
Patient ID: Marie [name], female   DOB: Jul 05, 1955, 58 y.o.   MRN: [id number]    Marie [name]    Date of visit:  06/09/2013 DOB:  1956/01/31    Age:  58 yrs. Medical record number:  [id number]     Account number:  [id number] Primary Care Provider: [facility] ____________________________ CURRENT DIAGNOSES  1. Stroke (CVA)  2. Diabetes Mellitus-NIDD  3. Hyperlipidemia  4. Essential hypertension ____________________________ ALLERGIES  Imitrex, Shortness of breath  Midrin, Nausea ____________________________ MEDICATIONS  1. multivitamin tablet, 1 p.o. daily  2. Vitamin D3 1,000 unit tablet, 1 p.o. daily  3. vitamin E 400 unit capsule, 1 p.o. daily  4. Fish Oil Concentrate 1,000 mg capsule, 1 p.o. daily  5. glimepiride 2 mg tablet, 1 1/2 tab qd  6. aspirin 81 mg chewable tablet, 1 p.o. daily  7. atorvastatin 20 mg tablet, 1 p.o. daily  8. lisinopril 5 mg tablet, 1 p.o. daily ____________________________ CHIEF COMPLAINTS Recent stroke, evaluate for cardiac source ____________________________ HISTORY OF PRESENT ILLNESS This very nice 58 year old female is seen at the request of Dr. Zachery [name] for cardiology evaluation in the setting of a recent stroke. The patient has a prior history of diabetes mellitus as well as hypertension and hyperlipidemia. She recently developed difficulty with speech and was found to have a left middle cerebral artery infarct within the past week. Her speech improved and she eventually was seen by the neurologist. She had an MRA that showed a small meningioma as well as the infarct in the left MCA distribution and had evidence of infarction in both lobes of the cerebellum, the posterior cortex and some other places. There was also evidence of cerebrovascular disease with 75% stenosis of the left MCA, 50% stenosis of the right MCA and severe disease of the posterior cerebral arteries. She saw the neurologist and is currently treated with aspirin. She denies angina and has  no PND, orthopnea or edema. She denies any palpitations and has never been diagnosed with arrhythmia in the past. She does note over the years that she has occasional unsteadiness in her gait. ____________________________ PAST HISTORY  Past Medical Illnesses:  CVA with deficits, hypertension, DM-non-insulin dependent, hyperlipidemia;  Cardiovascular Illnesses:  Carotid artery disease;  Surgical Procedures:  cesarean section;  Cardiology Procedures-Invasive:  no history of prior cardiac procedures;  Cardiology Procedures-Noninvasive:  no previous non-invasive procedures;  LVEF not documented,   ____________________________ CARDIO-PULMONARY TEST DATES EKG Date:  06/09/2013;   ____________________________ FAMILY HISTORY Father -- Father dead, Myocardial infarction Mother -- Mother dead, Hypertension, Stomach cancer, Diabetes mellitus ____________________________ SOCIAL HISTORY Alcohol Use:  occasionally;  Smoking:  never smoked;  Diet:  regular diet;  Lifestyle:  married;  Exercise:  walking for approximately 10 minutes 7 days per week;  Occupation:  International aid/development worker;  Residence:  lives with husband and children;   ____________________________ REVIEW OF SYSTEMS General:  weight gain  Integumentary:no rashes or new skin lesions. Eyes: denies diplopia, history of glaucoma or visual problems. Ears, Nose, Throat, Mouth:  denies any hearing loss, epistaxis, hoarseness or difficulty speaking. Respiratory: denies dyspnea, cough, wheezing or hemoptysis. Cardiovascular:  please review HPI Abdominal: denies dyspepsia, GI bleeding, constipation, or diarrheaGenitourinary-Female: no dysuria, urgency, frequency, UTIs, or stress incontinence Musculoskeletal:  denies arthritis, venous insufficiency, or muscle weakness Neurological:  see HPI Psychiatric:  anxiety  ____________________________ PHYSICAL EXAMINATION VITAL SIGNS  Blood Pressure:  174/80 Standing, Right arm, regular cuff  , 170/80 Standing, Left arm and  regular cuff   Pulse:  88/min.  Weight:  137.00 lbs. Height:  62.5"BMI: 24  Constitutional:  pleasant [place] appearing female, in no acute distress Skin:  warm and dry to touch, no apparent skin lesions, or masses noted. Head:  normocephalic, normal hair pattern, no masses or tenderness Eyes:  EOMS Intact, PERRLA, C and S clear, Funduscopic exam not done. ENT:  ears, nose and throat reveal no gross abnormalities.  Dentition good. Neck:  supple, without massess. No JVD, thyromegaly or carotid bruits. Carotid upstroke normal. Chest:  normal symmetry, clear to auscultation. Cardiac:  regular rhythm, normal S1 and S2, No S3 or S4, no murmurs, gallops or rubs detected. Abdomen:  abdomen soft,non-tender, no masses, no hepatospenomegaly, or aneurysm noted Peripheral Pulses:  the femoral,dorsalis pedis, and posterior tibial pulses are full and equal bilaterally with no bruits auscultated. Extremities & Back:  no deformities, clubbing, cyanosis, erythema or edema observed. Normal muscle strength and tone. Neurological:  no gross motor or sensory deficits noted, affect appropriate, oriented x3. ____________________________ MOST RECENT LIPID PANEL 03/15/13  CHOL TOTL 156 mg/dl, LDL 84 NM, HDL 51 mg/dl, TRIGLYCER 409104 mg/dl and CHOL/HDL 3.1 (Calc) ____________________________ IMPRESSIONS/PLAN  1. Recent left MCA infarct with evidence of multiple infarcts in the brain 2. Hypertension with blood pressure not well controlled today 3. Diabetes mellitus 4. Hyperlipidemia 5. Evidence of intracranial arterial disease  Recommendations:  1. We will obtain an echocardiogram to look for cardiac sources for cerebral emboli. 2. Obtain carotid Dopplers 3. We discussed the potential of atrial fibrillation and multiple infarctions. We discussed the options of wearing a 30 day event monitor versus having an implantable loop recorder. She would prefer at this time to wear the 30 day monitor and we can go from  there. We talked about the potential for change in anticoagulation of atrial fibrillation is demonstrated.  4. Because she has a prior stroke and definite vascular disease irecommended that she  increase her Lipitor to 40 mg daily for the time being. 5. I recommended that they obtain an Omron blood pressure cuff and monitor blood pressures at home as her blood pressure was elevated in the office today. She may need more intensive antihypertensive therapy if her blood pressure remains elevated. ____________________________ TODAYS ORDERS  1. 12 Lead EKG: Today  2. 2D, color flow, doppler: First Available  3. Carotid Duplex: First Available  4. [place]: 1 Day  5. Return Visit: 1 month                       ____________________________ Cardiology Physician:  Darden PalmerW. Spencer Nansi Birmingham, Jr. MD [facility]

## 2013-08-31 NOTE — Progress Notes (Signed)
Patient ID: Marie [name], female   DOB: 1955-12-23, 58 y.o.   MRN: [id number]    Marie, [name]    Date of visit:  08/31/2013 DOB:  Jan 28, 1956    Age:  58 yrs. Medical record number:  [id number]     Account number:  [id number] Primary Care Provider: [facility] ____________________________ CURRENT DIAGNOSES  1. Hypertensive Heart Disease-Benign without CHF  2. Carotid Artery Stenosis  3. Stroke (CVA) multiple  4. Hyperlipidemia, unspecified  5. Diabetes Mellitus-NIDD with retinopathy ____________________________ ALLERGIES  Imitrex, Shortness of breath  Midrin, Nausea ____________________________ MEDICATIONS  1. multivitamin tablet, 1 p.o. daily  2. Vitamin D3 1,000 unit tablet, 1 p.o. daily  3. vitamin E 400 unit capsule, 1 p.o. daily  4. Fish Oil Concentrate 1,000 mg capsule, 1 p.o. daily  5. aspirin 81 mg chewable tablet, 1 p.o. daily  6. lisinopril 5 mg tablet, 1 p.o. daily  7. herbal drugs tablet, ginko balbo 1 qd 500mg   8. herbal drugs capsule, acetyla l caratine 500mg  qd  9. herbal drugs capsule, phosphadyl  200mg  2 qd  10. atorvastatin 20 mg tablet, 1 p.o. daily  11. glimepiride 2 mg tablet, BID  12. lamotrigine 25 mg tablet, 3 bid  13. clopidogrel 75 mg tablet, 1 p.o. daily ____________________________ HISTORY OF PRESENT ILLNESS  Patient seen for cardiac followup. She has had another stroke since she was here and is having difficulty with movement of her right hand and is considering taking off time from work because of it. She has also had difficulty with losing her balance and falling. She had a TEE done that did not show any cardiac source of embolus. She also previously had a 30 day event monitor when she did not wish to have an implantable loop monitor that did not show any atrial fibrillation. She is nervous and upset occasionally feels palpitations. Her husband is with her and they are thinking about taking a three-month trip to [place]. She does not have angina. She  has a number of questions about her stroke, the etiology of the stroke, and her prognosis going forward. She has significant diabetes and also has hypertension. She has complications of diabetic retinopathy. ____________________________ PAST HISTORY  Past Medical Illnesses:  CVA with deficits, hypertension, DM-non-insulin dependent, hyperlipidemia;  Cardiovascular Illnesses:  Carotid artery disease;  Surgical Procedures:  cesarean section;  Cardiology Procedures-Invasive:  no history of prior cardiac procedures;  Cardiology Procedures-Noninvasive:  event monitor March 2015, echocardiogram March 2015, TEE May 2015;  LVEF of 65% documented via echocardiogram on 06/21/2013,   ____________________________ CARDIO-PULMONARY TEST DATES EKG Date:  06/09/2013;  Holter/Event Monitor Date: 06/10/2013;  Echocardiography Date: 06/21/2013;   ____________________________ FAMILY HISTORY Father -- Father dead, Myocardial infarction Mother -- Mother dead, Hypertension, Stomach cancer, Diabetes mellitus ____________________________ SOCIAL HISTORY Alcohol Use:  occasionally;  Smoking:  never smoked;  Diet:  regular diet;  Lifestyle:  married;  Exercise:  walking for approximately 10 minutes 7 days per week;  Occupation:  International aid/development worker;  Residence:  lives with husband and children;   ____________________________ REVIEW OF SYSTEMS General:  weight gain  Integumentary: no rashes or new skin lesions. Eyes:  diabetic retinopathy with laser treatments Respiratory: denies dyspnea, cough, wheezing or hemoptysis. Cardiovascular:  please review HPI Abdominal: denies dyspepsia, GI bleeding, constipation, or diarrhea Musculoskeletal:  denies arthritis, venous insufficiency, or muscle weakness Neurological:  see HPI  Psychiatric:  anxiety  ____________________________ PHYSICAL EXAMINATION VITAL SIGNS  Blood Pressure:  148/80 Sitting, Left arm, regular cuff  , 152/80  Standing, Left arm and regular cuff   Pulse:  100/min. Weight:   131.00 lbs. Height:  62.5"BMI: 23  Constitutional:  pleasant but anxious [place] female, in no acute distress Skin:  warm and dry to touch, no apparent skin lesions, or masses noted. Head:  normocephalic, normal hair pattern, no masses or tenderness Neck:  supple, without massess. No JVD, thyromegaly or carotid bruits. Carotid upstroke normal. Chest:  normal symmetry, clear to auscultation. Cardiac:  regular rhythm, normal S1 and S2, No S3 or S4, no murmurs, gallops or rubs detected. Peripheral Pulses:  the femoral,dorsalis pedis, and posterior tibial pulses are full and equal bilaterally with no bruits auscultated. Extremities & Back:  no deformities, clubbing, cyanosis, erythema or edema observed. Normal muscle strength and tone. Neurological:  weakness of right hand, somewhat unsteady gait ____________________________ MOST RECENT LIPID PANEL 03/15/13  CHOL TOTL 156 mg/dl, LDL 84 NM, HDL 51 mg/dl, TRIGLYCER 161104 mg/dl and CHOL/HDL 3.1 (Calc) ____________________________ IMPRESSIONS/PLAN  1. Recurrent strokes suggestive of embolic source 2. Hypertensive heart disease 3. Diabetes mellitus with ophthalmologic complications 4. Hyperlipidemia  Recommendations:  She is quite frustrated over the recurrent strokes. I again have recommended that she have an implantable loop monitor. She and her husband is having a lot of questions about this, the seizure, the scar from the implantation and also numerous questions regarding stroke, disability, and the future. I spoke with Dr. Graciela HusbandsKlein who would be willing to see her and her husband next Thursday to answer further questions regarding the procedure of implantation of a loop monitor. Recommended that they get their prescriptions for travel from her primary physician. She also needs rehab and neuro followup with the stroke questions.  ____________________________ Cardiology Physician:  Darden PalmerW. Spencer Tilley, Jr. MD [facility]

## 2013-09-01 ENCOUNTER — Encounter: Payer: Self-pay | Admitting: *Deleted

## 2013-09-04 ENCOUNTER — Other Ambulatory Visit: Payer: Self-pay | Admitting: Neurology

## 2013-09-07 ENCOUNTER — Encounter: Payer: Self-pay | Admitting: Internal Medicine

## 2013-09-07 ENCOUNTER — Institutional Professional Consult (permissible substitution): Payer: BC Managed Care – PPO | Admitting: Internal Medicine

## 2013-09-08 ENCOUNTER — Ambulatory Visit: Payer: BC Managed Care – PPO | Admitting: Neurology

## 2013-09-08 ENCOUNTER — Telehealth: Payer: Self-pay

## 2013-09-08 NOTE — Telephone Encounter (Signed)
Called patient to f/u on missed appt today. No answer. Left vmail to return call.

## 2013-10-30 ENCOUNTER — Telehealth: Payer: Self-pay | Admitting: Neurology

## 2013-10-30 NOTE — Telephone Encounter (Signed)
Patient called to reschedule no show appointment from June but patient did not want to wait until first available because she needs a note from Dr. Terrace ArabiaYan stating that she can go back to work. Please return call to patient and advise.

## 2013-10-31 NOTE — Telephone Encounter (Signed)
Called and spoke to patient she is coming in to see Dr.Yan 11-02-2013 at 8:30 am.

## 2013-10-31 NOTE — Telephone Encounter (Signed)
Called patient and left her message asking her to call me back about a sooner appt.

## 2013-10-31 NOTE — Telephone Encounter (Signed)
[facility], please call back and advise.

## 2013-11-01 ENCOUNTER — Encounter: Payer: Self-pay | Admitting: Cardiology

## 2013-11-01 NOTE — Progress Notes (Signed)
Patient ID: Dorann Davidson, female   DOB: 1955/04/18, 58 y.o.   MRN: [id number]   Shavy, Beachem    Date of visit:  11/01/2013 DOB:  09/08/1955    Age:  58 yrs. Medical record number:  [id number]     Account number:  [id number] Primary Care Provider: [facility] ____________________________ CURRENT DIAGNOSES  1. Hypertensive Heart Disease-Benign without CHF  2. Stroke (CVA)  3. Carotid Artery Stenosis  4. Diabetes Mellitus-NIDD  5. Hyperlipidemia ____________________________ ALLERGIES  Imitrex, Shortness of breath  Midrin, Nausea ____________________________ MEDICATIONS  1. multivitamin tablet, 1 p.o. daily  2. Vitamin D3 1,000 unit tablet, 1 p.o. daily  3. vitamin E 400 unit capsule, 1 p.o. daily  4. Fish Oil Concentrate 1,000 mg capsule, 1 p.o. daily  5. aspirin 81 mg chewable tablet, 1 p.o. daily  6. herbal drugs tablet, ginko balbo 1 qd 541m  7. herbal drugs capsule, acetyla l caratine 5057mqd  8. glimepiride 2 mg tablet, BID  9. clopidogrel 75 mg tablet, 1 p.o. daily  10. atorvastatin 40 mg tablet, 1 p.o. daily  11. lamotrigine 100 mg tablet, BID  12. lisinopril 20 mg tablet, 1 p.o. daily  13. amlodipine 2.5 mg tablet, 1 p.o. daily  14. atorvastatin 80 mg tablet, 1 p.o. daily ____________________________ CHIEF COMPLAINTS  Wants to discuss loop monitor ____________________________ HISTORY OF PRESENT ILLNESS Seen for cardiac followup. She decided not to get the loop monitor implanted because of travel concerns as well as concerns about infection, device implantation site et ceRonney AstersShe [place] and states she is feeling better. She says that she plans to take early retirement. She now wwants to consider having a loop monitor implanted for her recurrent embolic strokes.  She is concerned about her blood pressure. Blood pressure has been running high since she was here. She runs systolic blood pressures of at least 150 - 160 at home. She requested  a cholesterol check and her LDL cholesterol is over 100 today. She denies angina and has no PND, orthopnea or edema. ____________________________ PAST HISTORY  Past Medical Illnesses:  CVA with deficits, hypertension, DM-non-insulin dependent, hyperlipidemia;  Cardiovascular Illnesses:  Carotid artery disease;  Surgical Procedures:  cesarean section;  Cardiology Procedures-Invasive:  no history of prior cardiac procedures;  Cardiology Procedures-Noninvasive:  event monitor March 2015, echocardiogram March 2015, TEE May 2015;  LVEF of 65% documented via echocardiogram on 06/21/2013,   ____________________________ CARDIO-PULMONARY TEST DATES EKG Date:  06/09/2013;  Holter/Event Monitor Date: 06/10/2013;  Echocardiography Date: 06/21/2013;   ____________________________ FAMILY HISTORY Father -- Father dead, Myocardial infarction Mother -- Mother dead, Hypertension, Stomach cancer, Diabetes mellitus ____________________________ SOCIAL HISTORY Alcohol Use:  occasionally;  Smoking:  never smoked;  Diet:  regular diet;  Lifestyle:  married;  Exercise:  walking for approximately 10 minutes 7 days per week;  Occupation:  syArboriculturist Residence:  lives with husband and children;   ____________________________ REVIEW OF SYSTEMS General:  weight gain  Integumentary:no rashes or new skin lesions. Eyes: denies diplopia, history of glaucoma or visual problems. Respiratory: denies dyspnea, cough, wheezing or hemoptysis. Cardiovascular:  please review HPI Abdominal: denies dyspepsia, GI bleeding, constipation, or diarrhea Musculoskeletal:  denies arthritis, venous insufficiency, or muscle weakness Neurological:  see HPI Psychiatric:  anxiety  ____________________________ PHYSICAL EXAMINATION VITAL SIGNS  Blood Pressure:  186/74 Sitting, Right arm, regular cuff  , 178/80 Standing, Right arm and regular cuff   Pulse:  96/min. Weight:  130.50 lbs. Height:  62.5"BMI: 23  Constitutional:  pleasant [place] female,  in no acute distress Skin:  warm and dry to touch, no apparent skin lesions, or masses noted. Head:  normocephalic, normal hair pattern, no masses or tenderness Neck:  supple, without massess. No JVD, thyromegaly or carotid bruits. Carotid upstroke normal. Chest:  normal symmetry, clear to auscultation. Cardiac:  regular rhythm, normal S1 and S2, No S3 or S4, no murmurs, gallops or rubs detected. Peripheral Pulses:  the femoral,dorsalis pedis, and posterior tibial pulses are full and equal bilaterally with no bruits auscultated. Extremities & Back:  no deformities, clubbing, cyanosis, erythema or edema observed. Normal muscle strength and tone. Neurological:  no gross motor or sensory deficits noted, affect appropriate, oriented x3. ____________________________ MOST RECENT LIPID PANEL 11/01/13  CHOL TOTL 169 mg/dl, LDL 102 NM, HDL 44 mg/dl, TRIGLYCER 114 mg/dl and CHOL/HDL 3.9 (Calc) ____________________________ IMPRESSIONS/PLAN  1. Recurrent embolic stroke of uncertain source 2. Hypertensive heart disease 3. Hyperlipidemia with elevated LDL 4. Diabetes mellitus  Recommendations:  Extensive discussion again about loop monitor. We'll recontact Dr. Caryl Comes. I think he would be best if he met with she and her husband in the office prior to the loop monitor implant. Her blood pressure is not at goal. I increased her lisinopril to 20 mg daily and would suggest a target blood pressure 536/64 systolic. I added amlodipine 2.5 mg to her regimen. I also increased her atorvastatin 80 mg daily. Followup in one month. ____________________________ TODAYS ORDERS  1. Return Visit: 1 month  2.  Consult Dr. Caryl Comes: Schedule aSAP                       ____________________________ Cardiology Physician:  Kerry Hough MD [facility]

## 2013-11-02 ENCOUNTER — Ambulatory Visit (INDEPENDENT_AMBULATORY_CARE_PROVIDER_SITE_OTHER): Payer: BC Managed Care – PPO | Admitting: Neurology

## 2013-11-02 ENCOUNTER — Encounter: Payer: Self-pay | Admitting: Neurology

## 2013-11-02 VITALS — BP 162/85 | HR 92 | Ht 63.0 in | Wt 131.0 lb

## 2013-11-02 DIAGNOSIS — I672 Cerebral atherosclerosis: Secondary | ICD-10-CM

## 2013-11-02 DIAGNOSIS — I119 Hypertensive heart disease without heart failure: Secondary | ICD-10-CM

## 2013-11-02 DIAGNOSIS — Z8673 Personal history of transient ischemic attack (TIA), and cerebral infarction without residual deficits: Secondary | ICD-10-CM

## 2013-11-02 DIAGNOSIS — E785 Hyperlipidemia, unspecified: Secondary | ICD-10-CM

## 2013-11-02 MED ORDER — CLOPIDOGREL BISULFATE 75 MG PO TABS: 75.0000 mg | ORAL_TABLET | Freq: Every day | ORAL | Status: DC
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
## 2013-11-08 ENCOUNTER — Ambulatory Visit (INDEPENDENT_AMBULATORY_CARE_PROVIDER_SITE_OTHER): Payer: BC Managed Care – PPO | Admitting: Ophthalmology

## 2013-11-22 ENCOUNTER — Ambulatory Visit (INDEPENDENT_AMBULATORY_CARE_PROVIDER_SITE_OTHER): Payer: BC Managed Care – PPO | Admitting: Internal Medicine

## 2013-11-22 ENCOUNTER — Encounter: Payer: Self-pay | Admitting: *Deleted

## 2013-11-22 ENCOUNTER — Encounter: Payer: Self-pay | Admitting: Internal Medicine

## 2013-11-22 ENCOUNTER — Encounter (HOSPITAL_COMMUNITY): Payer: Self-pay | Admitting: Pharmacy Technician

## 2013-11-22 VITALS — BP 120/90 | HR 90 | Ht 63.0 in | Wt 129.6 lb

## 2013-11-22 DIAGNOSIS — I6350 Cerebral infarction due to unspecified occlusion or stenosis of unspecified cerebral artery: Secondary | ICD-10-CM

## 2013-11-22 DIAGNOSIS — I639 Cerebral infarction, unspecified: Secondary | ICD-10-CM

## 2013-11-22 DIAGNOSIS — Z01812 Encounter for preprocedural laboratory examination: Secondary | ICD-10-CM

## 2013-11-22 NOTE — Patient Instructions (Signed)
Your physician recommends that you continue on your current medications as directed. Please refer to the Current Medication list given to you today.  Pre procedure labs today: BMET, CBCD  Your physician has recommended that you have a loop recorder (LINQ) inserted. Please see the instruction sheet given to you today for more information.  Your wound check is scheduled for 12/07/13 at 2:00 p.m.  at [address].

## 2013-11-22 NOTE — Progress Notes (Signed)
HPI [name] is referred today for ongoing evaluation and management of unexplained stroke.  She is a pleasant 58 yo woman with a h/o HTN and DM who has had several strokes documented by MRI.  She has never had atrial fibrillation and she does not have palpitations. She is referred today for insertion of an ILR. Allergies  Allergen Reactions  . Other     Allergic to beef and eggplant     Current Outpatient Prescriptions  Medication Sig Dispense Refill  . ACCU-CHEK AVIVA PLUS test strip       . aspirin EC 81 MG tablet Take 1 tablet (81 mg total) by mouth daily.  90 tablet  12  . atorvastatin (LIPITOR) 20 MG tablet Take 1 tablet (20 mg total) by mouth daily.  30 tablet  12  . Cholecalciferol (VITAMIN D3) 3000 UNITS TABS Take 3,000 Units by mouth daily.       . clopidogrel (PLAVIX) 75 MG tablet Take 1 tablet (75 mg total) by mouth daily.  30 tablet  11  . Coenzyme Q10 (CO Q 10 PO) Take 200 mg by mouth daily.       . Ginkgo Biloba 500 MG CAPS Take 500 mg by mouth daily.       [name]. glimepiride (AMARYL) 2 MG tablet Take 2 mg by mouth 2 (two) times daily.       [name]. lisinopril (PRINIVIL,ZESTRIL) 5 MG tablet Take 1 tablet (5 mg total) by mouth daily.  30 tablet  12  . Multiple Vitamins-Minerals (CENTRUM) tablet Take 1 tablet by mouth daily.      . Omega-3 Fatty Acids (FISH OIL) 1000 MG CAPS Take by mouth daily.       No current facility-administered medications for this visit.     Past Medical History  Diagnosis Date  . Diabetes   . Hyperlipemia   . Migraines     ROS:   All systems reviewed and negative except as noted in the HPI.   Past Surgical History  Procedure Laterality Date  . Cesarean section    . Tee without cardioversion N/A 08/24/2013    Procedure: TRANSESOPHAGEAL ECHOCARDIOGRAM (TEE);  Surgeon: Thurmon FairMihai Croitoru, MD;  [facility];  Service: Cardiovascular;  Laterality: N/A;     Family History  Problem Relation Age of Onset  . Stomach cancer Mother   .  Diabetes Maternal Grandfather   . High blood pressure Mother   . High blood pressure Maternal Grandfather      History   Social History  . Marital Status: Married    Spouse Name: N/A    Number of Children: 3  . Years of Education: MBA   Occupational History  .      [employer]   Social History Main Topics  . Smoking status: Never Smoker   . Smokeless tobacco: Never Used  . Alcohol Use: 0.5 oz/week    1 drink(s) per week     Comment: Social  . Drug Use: No  . Sexual Activity: Not on file   Other Topics Concern  . Not on file   Social History Narrative   Patient lives at home with her husband (Marie [name]).   Patient works full time [employer]     College education MBA   Right handed   Caffeine two cups daily.              Ht 5\' 3"  (1.6 m)  Wt 129 lb 9.6  oz (58.786 kg)  BMI 22.96 kg/m2  Physical Exam:  Well appearing middle aged woman, NAD HEENT: Unremarkable Neck:  No JVD, no thyromegally Back:  No CVA tenderness Lungs:  Clear with no wheezes HEART:  Regular rate rhythm, no murmurs, no rubs, no clicks Abd:  soft, positive bowel sounds, no organomegally, no rebound, no guarding Ext:  2 plus pulses, no edema, no cyanosis, no clubbing Skin:  No rashes no nodules Neuro:  CN II through XII intact, motor grossly intact  EKG - nsr  Assess/Plan:

## 2013-11-22 NOTE — Assessment & Plan Note (Signed)
The etiology of her strokes is unclear. We will have her undergo insertion of an ILR. She wishes to proceed.

## 2013-11-29 ENCOUNTER — Ambulatory Visit (HOSPITAL_COMMUNITY)
Admission: RE | Admit: 2013-11-29 | Discharge: 2013-11-29 | Disposition: A | Discharge status: Home / Self Care | Payer: BC Managed Care – PPO | Source: Ambulatory Visit | Attending: Internal Medicine | Admitting: Internal Medicine

## 2013-11-29 ENCOUNTER — Encounter (HOSPITAL_COMMUNITY)
Admission: RE | Disposition: A | Discharge status: Home / Self Care | Payer: Self-pay | Source: Ambulatory Visit | Attending: Internal Medicine

## 2013-11-29 DIAGNOSIS — E785 Hyperlipidemia, unspecified: Secondary | ICD-10-CM | POA: Diagnosis not present

## 2013-11-29 DIAGNOSIS — I6350 Cerebral infarction due to unspecified occlusion or stenosis of unspecified cerebral artery: Secondary | ICD-10-CM

## 2013-11-29 DIAGNOSIS — Z8673 Personal history of transient ischemic attack (TIA), and cerebral infarction without residual deficits: Secondary | ICD-10-CM | POA: Diagnosis not present

## 2013-11-29 DIAGNOSIS — Z7902 Long term (current) use of antithrombotics/antiplatelets: Secondary | ICD-10-CM | POA: Diagnosis not present

## 2013-11-29 DIAGNOSIS — G43909 Migraine, unspecified, not intractable, without status migrainosus: Secondary | ICD-10-CM | POA: Insufficient documentation

## 2013-11-29 DIAGNOSIS — E119 Type 2 diabetes mellitus without complications: Secondary | ICD-10-CM | POA: Diagnosis not present

## 2013-11-29 DIAGNOSIS — Z7982 Long term (current) use of aspirin: Secondary | ICD-10-CM | POA: Insufficient documentation

## 2013-11-29 DIAGNOSIS — I10 Essential (primary) hypertension: Secondary | ICD-10-CM | POA: Insufficient documentation

## 2013-11-29 HISTORY — PX: LOOP RECORDER IMPLANT: SHX5477

## 2013-11-29 LAB — GLUCOSE, CAPILLARY: GLUCOSE-CAPILLARY: 97 mg/dL (ref 70–99)

## 2013-11-29 SURGERY — LOOP RECORDER IMPLANT
Anesthesia: LOCAL

## 2013-11-29 MED ORDER — LIDOCAINE-EPINEPHRINE 1 %-1:100000 IJ SOLN
INTRAMUSCULAR | Status: AC
  Filled 2013-11-29: qty 1

## 2013-11-29 NOTE — H&P (View-Only) (Signed)
HPI Marie [name] is referred today for ongoing evaluation and management of unexplained stroke.  She is a pleasant 57 yo woman with a h/o HTN and DM who has had several strokes documented by MRI.  She has never had atrial fibrillation and she does not have palpitations. She is referred today for insertion of an ILR. Allergies  Allergen Reactions  . Other     Allergic to beef and eggplant     Current Outpatient Prescriptions  Medication Sig Dispense Refill  . ACCU-CHEK AVIVA PLUS test strip       . aspirin EC 81 MG tablet Take 1 tablet (81 mg total) by mouth daily.  90 tablet  12  . atorvastatin (LIPITOR) 20 MG tablet Take 1 tablet (20 mg total) by mouth daily.  30 tablet  12  . Cholecalciferol (VITAMIN D3) 3000 UNITS TABS Take 3,000 Units by mouth daily.       . clopidogrel (PLAVIX) 75 MG tablet Take 1 tablet (75 mg total) by mouth daily.  30 tablet  11  . Coenzyme Q10 (CO Q 10 PO) Take 200 mg by mouth daily.       . Ginkgo Biloba 500 MG CAPS Take 500 mg by mouth daily.       [name] glimepiride (AMARYL) 2 MG tablet Take 2 mg by mouth 2 (two) times daily.       [name] lisinopril (PRINIVIL,ZESTRIL) 5 MG tablet Take 1 tablet (5 mg total) by mouth daily.  30 tablet  12  . Multiple Vitamins-Minerals (CENTRUM) tablet Take 1 tablet by mouth daily.      . Omega-3 Fatty Acids (FISH OIL) 1000 MG CAPS Take by mouth daily.       No current facility-administered medications for this visit.     Past Medical History  Diagnosis Date  . Diabetes   . Hyperlipemia   . Migraines     ROS:   All systems reviewed and negative except as noted in the HPI.   Past Surgical History  Procedure Laterality Date  . Cesarean section    . Tee without cardioversion N/A 08/24/2013    Procedure: TRANSESOPHAGEAL ECHOCARDIOGRAM (TEE);  Surgeon: [name], MD;  Location: [facility] ENDOSCOPY;  Service: Cardiovascular;  Laterality: N/A;     Family History  Problem Relation Age of Onset  . Stomach cancer Mother   .  Diabetes Maternal Grandfather   . High blood pressure Mother   . High blood pressure Maternal Grandfather      History   Social History  . Marital Status: Married    Spouse Name: N/A    Number of Children: 3  . Years of Education: MBA   Occupational History  .      [employer]   Social History Main Topics  . Smoking status: Never Smoker   . Smokeless tobacco: Never Used  . Alcohol Use: 0.5 oz/week    1 drink(s) per week     Comment: Social  . Drug Use: No  . Sexual Activity: Not on file   Other Topics Concern  . Not on file   Social History Narrative   Patient lives at home with [name].   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              Ht  (1.6 m)  Wt 129 lb 9.6  oz (58.786 kg)  BMI 22.96 kg/m2  Physical Exam:  Well appearing middle aged woman, NAD HEENT: Unremarkable Neck:  No JVD, no thyromegally Back:  No CVA tenderness Lungs:  Clear with no wheezes HEART:  Regular rate rhythm, no murmurs, no rubs, no clicks Abd:  soft, positive bowel sounds, no organomegally, no rebound, no guarding Ext:  2 plus pulses, no edema, no cyanosis, no clubbing Skin:  No rashes no nodules Neuro:  CN II through XII intact, motor grossly intact  EKG - nsr  Assess/Plan:

## 2013-11-29 NOTE — CV Procedure (Signed)
EP Procedure Note  Procedure: ILR insertion  Indication: cryptogenic stroke  Description of the Procedure: After informed consent was obtained, the patient was prepped and draped in the usual manner. 20 cc of lidocaine was infiltrated into the left pectoral region. A one cm stab incision was made and the Medtronic ILR, serial # [id number] was inserted under the skin. Steristrips and a bandage were placed. The R waves measured 0.6 mV. Less than a cc of blood loss.  Complications: none immediately.  Conclusion: successful insertion of an ILR.   Marie [name].D.

## 2013-11-29 NOTE — Interval H&P Note (Signed)
History and Physical Interval Note:  11/29/2013 10:27 AM  Marie [name]  has presented today for surgery, with the diagnosis of stroke  The various methods of treatment have been discussed with the patient and family. After consideration of risks, benefits and other options for treatment, the patient has consented to  Procedure(s): LOOP RECORDER IMPLANT (N/A) as a surgical intervention .  The patient's history has been reviewed, patient examined, no change in status, stable for surgery.  I have reviewed the patient's chart and labs.  Questions were answered to the patient's satisfaction.     Leonia Reeves.D.

## 2013-12-01 ENCOUNTER — Ambulatory Visit (INDEPENDENT_AMBULATORY_CARE_PROVIDER_SITE_OTHER): Payer: BC Managed Care – PPO | Admitting: *Deleted

## 2013-12-01 ENCOUNTER — Telehealth: Payer: Self-pay | Admitting: Internal Medicine

## 2013-12-01 DIAGNOSIS — Z8673 Personal history of transient ischemic attack (TIA), and cerebral infarction without residual deficits: Secondary | ICD-10-CM

## 2013-12-01 LAB — MDC_IDC_ENUM_SESS_TYPE_INCLINIC

## 2013-12-01 NOTE — Progress Notes (Signed)
Site check for ILR. Device not interrogated. Rebandaged wound per request of pt. Pt on plavix--wound shows normal anti-platelet reaction. ROV for full wound check 12/06/13. [facility] followed by Donnie Aho.

## 2013-12-01 NOTE — Telephone Encounter (Signed)
Attempted to call, phone rang w/out voicemail.

## 2013-12-01 NOTE — Telephone Encounter (Signed)
Called # w/ [phone] area code. Pt's has bleeding at wound site. Pt still wearing bandages from insertion. Made device clinic appt today at 3:30 to view wound site.

## 2013-12-01 NOTE — Telephone Encounter (Signed)
Routing to device clinic to address 

## 2013-12-01 NOTE — Telephone Encounter (Signed)
New Message  Pt called reports her wound was leaking.  (Not currently leaking) Around the wound is puffy.. Pt requests a call back to discuss

## 2013-12-06 ENCOUNTER — Ambulatory Visit (INDEPENDENT_AMBULATORY_CARE_PROVIDER_SITE_OTHER): Payer: BC Managed Care – PPO | Admitting: *Deleted

## 2013-12-06 ENCOUNTER — Institutional Professional Consult (permissible substitution): Payer: BC Managed Care – PPO | Admitting: Internal Medicine

## 2013-12-06 DIAGNOSIS — I6350 Cerebral infarction due to unspecified occlusion or stenosis of unspecified cerebral artery: Secondary | ICD-10-CM

## 2013-12-06 DIAGNOSIS — I639 Cerebral infarction, unspecified: Secondary | ICD-10-CM

## 2013-12-06 LAB — MDC_IDC_ENUM_SESS_TYPE_INCLINIC

## 2013-12-06 NOTE — Progress Notes (Signed)
Wound check-ILR.  1 symptom recorded episode on the day of the implant.  R-waves 0.44-0.47mV.  Steri strips removed, wound well healed. Follow up with Dr. Donnie Aho.

## 2013-12-13 ENCOUNTER — Institutional Professional Consult (permissible substitution): Payer: BC Managed Care – PPO | Admitting: Internal Medicine

## 2013-12-13 ENCOUNTER — Encounter: Payer: Self-pay | Admitting: Internal Medicine

## 2013-12-14 ENCOUNTER — Encounter: Payer: Self-pay | Admitting: Internal Medicine

## 2014-01-08 ENCOUNTER — Telehealth: Payer: Self-pay | Admitting: *Deleted

## 2014-01-08 NOTE — Telephone Encounter (Signed)
Calling patient to r/s appointment on 02/02/14 with either NP CM/ MM/ [facility], left message for patient to return the call.

## 2014-01-22 ENCOUNTER — Encounter: Payer: Self-pay | Admitting: *Deleted

## 2014-02-02 ENCOUNTER — Ambulatory Visit: Payer: BC Managed Care – PPO | Admitting: Nurse Practitioner

## 2014-03-15 ENCOUNTER — Encounter (HOSPITAL_COMMUNITY): Payer: Self-pay | Admitting: Internal Medicine

## 2014-05-11 ENCOUNTER — Telehealth: Payer: Self-pay | Admitting: Neurology

## 2014-05-11 NOTE — Telephone Encounter (Signed)
Called patient and spoke to her patient has apt with Dr.Yan on Monday Dr.Yan had a CX.

## 2014-05-11 NOTE — Telephone Encounter (Signed)
[facility] lab requesting an appt with Dr. Terrace ArabiaYan.  Says that [place] past 3 [place].  When she returned she made a visit with her cardiologist Dr. Donnie Ahoilley who advised her to follow up with Dr. Terrace ArabiaYan due to 2 previous strokes.  Advised the patient that I would send her request to Dr. Zannie CoveYan's assistant for scheduling.

## 2014-05-14 ENCOUNTER — Ambulatory Visit (INDEPENDENT_AMBULATORY_CARE_PROVIDER_SITE_OTHER): Payer: BLUE CROSS/BLUE SHIELD | Admitting: Neurology

## 2014-05-14 ENCOUNTER — Encounter: Payer: Self-pay | Admitting: Neurology

## 2014-05-14 VITALS — BP 160/82 | HR 68 | Ht 62.0 in | Wt 132.0 lb

## 2014-05-14 DIAGNOSIS — R202 Paresthesia of skin: Secondary | ICD-10-CM

## 2014-05-14 DIAGNOSIS — I672 Cerebral atherosclerosis: Secondary | ICD-10-CM

## 2014-05-14 MED ORDER — CLOPIDOGREL BISULFATE 75 MG PO TABS: 75.0000 mg | ORAL_TABLET | Freq: Every day | ORAL | Status: DC
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
## 2014-05-15 ENCOUNTER — Telehealth: Payer: Self-pay | Admitting: Neurology

## 2014-05-15 NOTE — Telephone Encounter (Signed)
[name]: Please call patient, laboratory showed elevated A1c 8.1, her diabetes is not under optimal control, she needs to contact her primary care physician [place] for further evaluation, rest of the laboratory including thyroid, B12 was normal. I have faxed the laboratory to her primary care doctor Marie RainierElizabeth [name], and cardiologist Dr. Ellwood HandlerWilliam Tilley.

## 2014-05-15 NOTE — Telephone Encounter (Signed)
Left message on home and cell numbers.

## 2014-05-16 LAB — IFE AND PE, SERUM
Albumin SerPl Elph-Mcnc: 3.8 g/dL (ref 3.2–5.6)
Albumin/Glob SerPl: 1.4 (ref 0.7–2.0)
Alpha 1: 0.2 g/dL (ref 0.1–0.4)
Alpha2 Glob SerPl Elph-Mcnc: 0.8 g/dL (ref 0.4–1.2)
B-Globulin SerPl Elph-Mcnc: 0.9 g/dL (ref 0.6–1.3)
Gamma Glob SerPl Elph-Mcnc: 1 g/dL (ref 0.5–1.6)
Globulin, Total: 2.9 g/dL (ref 2.0–4.5)
IgA/Immunoglobulin A, Serum: 138 mg/dL (ref 91–414)
IgG (Immunoglobin G), Serum: 932 mg/dL (ref 700–1600)
IgM (Immunoglobulin M), Srm: 146 mg/dL (ref 40–230)
Total Protein: 6.7 g/dL (ref 6.0–8.5)

## 2014-05-16 LAB — THYROID PANEL WITH TSH
Free Thyroxine Index: 2.7 (ref 1.2–4.9)
T3 Uptake Ratio: 25 % (ref 24–39)
T4, Total: 10.9 ug/dL (ref 4.5–12.0)
TSH: 2.78 u[IU]/mL (ref 0.450–4.500)

## 2014-05-16 LAB — B. BURGDORFI ANTIBODIES: Lyme IgG/IgM Ab: <0.91 {ISR} (ref 0.00–0.90)

## 2014-05-16 LAB — VITAMIN B12: Vitamin B-12: 328 pg/mL (ref 211–946)

## 2014-05-16 LAB — HEMOGLOBIN A1C
Est. average glucose Bld gHb Est-mCnc: 186 mg/dL
Hgb A1c MFr Bld: 8.1 % — ABNORMAL HIGH (ref 4.8–5.6)

## 2014-05-16 LAB — RPR: RPR Ser Ql: NONREACTIVE

## 2014-05-16 NOTE — Telephone Encounter (Signed)
Patient aware of results and will follow up with her PCP.

## 2014-05-17 ENCOUNTER — Encounter: Payer: Self-pay | Admitting: *Deleted

## 2014-05-17 ENCOUNTER — Telehealth: Payer: Self-pay | Admitting: *Deleted

## 2014-05-17 NOTE — Telephone Encounter (Signed)
Patient is wanting to know what is the Lamotrigine for. Patient wants to know why is she taking this medication. Please call patient and advise.

## 2014-05-17 NOTE — Telephone Encounter (Signed)
Spoke to Marie [name] - she wanted to clarify what the medication was for (she read it was a seizure med).  I told her the use and she wishes to stay off of the med.

## 2014-05-17 NOTE — Telephone Encounter (Signed)
Lamotrigine was started in May 2015 for her reported episodes of confusion, unsteadiness, depression, it is a medicine for seizure, and mood disorder, is no longer on her medication list

## 2014-05-18 ENCOUNTER — Other Ambulatory Visit: Payer: Self-pay | Admitting: Family Medicine

## 2014-05-18 DIAGNOSIS — R0989 Other specified symptoms and signs involving the circulatory and respiratory systems: Secondary | ICD-10-CM

## 2014-06-05 ENCOUNTER — Ambulatory Visit: Payer: Self-pay | Admitting: Neurology

## 2014-06-05 ENCOUNTER — Encounter: Payer: BLUE CROSS/BLUE SHIELD | Admitting: Neurology

## 2014-06-05 ENCOUNTER — Encounter: Payer: BLUE CROSS/BLUE SHIELD | Admitting: Radiology

## 2014-07-05 ENCOUNTER — Other Ambulatory Visit: Payer: Self-pay | Admitting: Neurology

## 2014-07-06 NOTE — Telephone Encounter (Signed)
Prescribed at [facility] on 03/04

## 2015-01-15 ENCOUNTER — Telehealth: Payer: Self-pay | Admitting: Neurology

## 2015-01-15 NOTE — Telephone Encounter (Signed)
Patient is calling and states she needs an appointment for a check-up before she leaves the country on November 2nd.  Can she be worked in?

## 2015-01-15 NOTE — Telephone Encounter (Signed)
Spoke to patient - worked into schedule on 01/23/15.

## 2015-01-23 ENCOUNTER — Encounter: Payer: Self-pay | Admitting: Neurology

## 2015-01-23 ENCOUNTER — Ambulatory Visit (INDEPENDENT_AMBULATORY_CARE_PROVIDER_SITE_OTHER): Payer: BC Managed Care – PPO | Admitting: Neurology

## 2015-01-23 VITALS — BP 157/85 | HR 79 | Ht 63.0 in | Wt 124.0 lb

## 2015-01-23 DIAGNOSIS — E1142 Type 2 diabetes mellitus with diabetic polyneuropathy: Secondary | ICD-10-CM | POA: Diagnosis not present

## 2015-01-23 DIAGNOSIS — Z8673 Personal history of transient ischemic attack (TIA), and cerebral infarction without residual deficits: Secondary | ICD-10-CM | POA: Diagnosis not present

## 2015-01-23 MED ORDER — LISINOPRIL 10 MG PO TABS: 10.0000 mg | ORAL_TABLET | Freq: Every day | ORAL | Status: DC

## 2015-01-23 MED ORDER — ATORVASTATIN CALCIUM 20 MG PO TABS: 20.0000 mg | ORAL_TABLET | Freq: Every day | ORAL | Status: DC
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
## 2015-01-24 DIAGNOSIS — E1142 Type 2 diabetes mellitus with diabetic polyneuropathy: Secondary | ICD-10-CM | POA: Insufficient documentation

## 2015-02-04 ENCOUNTER — Ambulatory Visit (INDEPENDENT_AMBULATORY_CARE_PROVIDER_SITE_OTHER): Payer: BC Managed Care – PPO | Admitting: Ophthalmology

## 2015-07-01 ENCOUNTER — Ambulatory Visit (INDEPENDENT_AMBULATORY_CARE_PROVIDER_SITE_OTHER): Payer: BC Managed Care – PPO | Admitting: Neurology

## 2015-07-01 ENCOUNTER — Encounter: Payer: Self-pay | Admitting: Neurology

## 2015-07-01 VITALS — BP 130/80 | HR 109 | Ht 63.0 in | Wt 122.0 lb

## 2015-07-01 DIAGNOSIS — R531 Weakness: Secondary | ICD-10-CM | POA: Diagnosis not present

## 2015-07-01 DIAGNOSIS — Z8673 Personal history of transient ischemic attack (TIA), and cerebral infarction without residual deficits: Secondary | ICD-10-CM
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
## 2015-07-04 ENCOUNTER — Other Ambulatory Visit: Payer: Self-pay | Admitting: Neurology

## 2015-07-10 ENCOUNTER — Telehealth: Payer: Self-pay | Admitting: Neurology

## 2015-07-10 MED ORDER — CLOPIDOGREL BISULFATE 75 MG PO TABS: 75.0000 mg | ORAL_TABLET | Freq: Every day | ORAL | Status: DC

## 2015-07-10 NOTE — Telephone Encounter (Signed)
I have talked with Marie [name], she has been taking asa 81mg  + Plavix 75mg  daily since Nov 2016,   From stroke standpoint, she only need for antiplatelet agent, she had recurrent stroke while taking aspirin, I have advised her to stop aspirin, I refilled her Plavix 75 mg daily 90 day supply with 4 refills

## 2015-07-10 NOTE — Telephone Encounter (Signed)
Patient is calling to discuss medication generic Plavix. She had called the pharmacy for a refill but states could not be filled because it showed not prescribed. I see at the patient's 05-14-14 OV this medication was discontinued. Please call the patient.

## 2015-07-10 NOTE — Telephone Encounter (Signed)
Spoke to patient - states she has been taking aspirin 81mg  and Plavix 75mg  daily.  Do you want her to continue both medications?

## 2015-07-10 NOTE — Telephone Encounter (Signed)
Pt called back. °

## 2015-08-07 ENCOUNTER — Encounter: Payer: BC Managed Care – PPO | Admitting: Neurology

## 2015-08-07 ENCOUNTER — Telehealth: Payer: Self-pay | Admitting: *Deleted

## 2015-08-07 NOTE — Telephone Encounter (Signed)
No showed NCV/EMG appointment. 

## 2015-08-08 ENCOUNTER — Encounter: Payer: Self-pay | Admitting: Diagnostic Neuroimaging

## 2015-08-13 ENCOUNTER — Telehealth: Payer: Self-pay | Admitting: Neurology

## 2015-08-13 NOTE — Telephone Encounter (Signed)
Pt called said she rec'd letter. sts she called the clinic and advised that she fell and could not make appt. Pt was very apologetic. Operator advised pt of the no show policy. Pt was very understanding. FYI

## 2015-09-27 ENCOUNTER — Other Ambulatory Visit: Payer: Self-pay | Admitting: Neurology

## 2015-10-10 ENCOUNTER — Telehealth: Payer: Self-pay | Admitting: Neurology

## 2015-10-10 NOTE — Telephone Encounter (Signed)
Pt called said she's had increased numbness and tingling bil hands and feet. She did not want to schedule NCV. She is requesting an appt . Please call

## 2015-10-10 NOTE — Telephone Encounter (Signed)
Spoke to patient - she wanted to reschedule her NCV/EMG appt - new time provided to her.

## 2015-11-25 ENCOUNTER — Ambulatory Visit (INDEPENDENT_AMBULATORY_CARE_PROVIDER_SITE_OTHER): Payer: BC Managed Care – PPO | Admitting: Neurology

## 2015-11-25 DIAGNOSIS — Z8673 Personal history of transient ischemic attack (TIA), and cerebral infarction without residual deficits: Secondary | ICD-10-CM | POA: Diagnosis not present

## 2015-11-25 DIAGNOSIS — R202 Paresthesia of skin: Secondary | ICD-10-CM

## 2015-11-25 DIAGNOSIS — E1142 Type 2 diabetes mellitus with diabetic polyneuropathy: Secondary | ICD-10-CM | POA: Diagnosis not present

## 2015-11-25 DIAGNOSIS — R413 Other amnesia: Secondary | ICD-10-CM

## 2015-11-25 DIAGNOSIS — R531 Weakness: Secondary | ICD-10-CM

## 2015-11-25 MED ORDER — VENLAFAXINE HCL ER 37.5 MG PO CP24: 37.5000 mg | ORAL_CAPSULE | Freq: Every day | ORAL | 11 refills | Status: DC

## 2015-11-25 NOTE — Procedures (Signed)
   NCS (NERVE CONDUCTION STUDY) WITH EMG (ELECTROMYOGRAPHY) REPORT   STUDY DATE: November 25 2015 PATIENT NAME: Marie [name] DOB: 01-29-1956 MRN: [id number]    TECHNOLOGIST: Gearldine ShownLorraine Jones ELECTROMYOGRAPHER: Levert FeinsteinYan, Kortney Schoenfelder M.D.  CLINICAL INFORMATION: 60 years old female, with history of stroke, diabetes, presenting with bilateral feet paresthesia, worsening on the right side.  FINDINGS: NERVE CONDUCTION STUDY: Bilateral peroneal sensory responses were normal. Bilateral peroneal to EDB and tibial motor responses were normal. Bilateral tibial H reflexes were normal and symmetric.  Right median, ulnar sensory and motor responses were normal.   NEEDLE ELECTROMYOGRAPHY: Selective needle examinations were performed at right upper, lower extremity muscles, right cervical, lumbar sacral paraspinal muscles.  Needle examination of right pronator teres, first dorsal interossei, biceps, triceps, deltoid was normal.  There was no spontaneous activity at right cervical paraspinal muscles, right C5-6 and 7.  Needle examination of right tibialis anterior, tibialis posterior, medial gastrocnemius, peroneal longus, vastus lateralis was normal.  There was no spontaneous activity at right lumbosacral paraspinal muscles, right L4-5 S1.  IMPRESSION:   This is a normal study, there is no electrodiagnostic evidence of large fiber peripheral neuropathy, right lumbosacral radiculopathy, or right cervical radiculopathy.  INTERPRETING PHYSICIAN:   Levert FeinsteinYan, Zakiyyah Savannah M.D. Ph.D. [facility] [address], [address] [place], [place] [address] [phone]
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
## 2015-11-27 ENCOUNTER — Telehealth: Payer: Self-pay | Admitting: Neurology

## 2015-11-27 LAB — VITAMIN B12: Vitamin B-12: 408 pg/mL (ref 211–946)

## 2015-11-27 LAB — VITAMIN D 25 HYDROXY (VIT D DEFICIENCY, FRACTURES): Vit D, 25-Hydroxy: 33.7 ng/mL (ref 30.0–100.0)

## 2015-11-27 LAB — TSH: TSH: 2.3 u[IU]/mL (ref 0.450–4.500)

## 2015-11-27 NOTE — Telephone Encounter (Signed)
Tried calling pt back. Phone kept ringing, unable to LVM.

## 2015-11-27 NOTE — Telephone Encounter (Signed)
Spoke to patient - she developed a bruise on her arm following her NCV/EMG - no pain and it has not changed in size.  I told her this sometimes occurs with these studies - no need to worry.  She appreciated the return call.

## 2015-11-27 NOTE — Telephone Encounter (Signed)
Attempted to reach patient again - no answer or machine.

## 2015-11-27 NOTE — Telephone Encounter (Addendum)
Patient is calling. She had a NCV/EMG this past Monday in our office and yesterday she noticed a big bruise on her right arm which is dark in color and purple. It is not painful and is about 3 inches in diameter. Is this normal? She did take pictures she could send to Dr. Terrace ArabiaYan. Please call to discuss.

## 2015-12-05 ENCOUNTER — Other Ambulatory Visit: Payer: Self-pay | Admitting: *Deleted

## 2015-12-05 ENCOUNTER — Telehealth: Payer: Self-pay | Admitting: Neurology

## 2015-12-05 MED ORDER — ALPRAZOLAM 0.5 MG PO TABS: ORAL_TABLET | ORAL | 0 refills | Status: DC

## 2015-12-05 NOTE — Telephone Encounter (Signed)
Patient is claustrophobic and is requesting sedation for her MRI.  Ok, per Dr. Terrace ArabiaYan, to provide Xanax per office protocol. Rx printed, signed, faxed and confirmed to pharmacy.

## 2015-12-05 NOTE — Telephone Encounter (Signed)
I spoke with the patient today regarding her MRI. She states that the last time she had an MRI it made her feel bad and she wants to know if there is another option. She does not want to do it again. Please call and advise.

## 2015-12-17 ENCOUNTER — Other Ambulatory Visit: Payer: Self-pay | Admitting: *Deleted

## 2015-12-17 ENCOUNTER — Telehealth: Payer: Self-pay | Admitting: Neurology

## 2015-12-17 DIAGNOSIS — E1142 Type 2 diabetes mellitus with diabetic polyneuropathy: Secondary | ICD-10-CM

## 2015-12-17 DIAGNOSIS — Z8673 Personal history of transient ischemic attack (TIA), and cerebral infarction without residual deficits: Secondary | ICD-10-CM

## 2015-12-17 DIAGNOSIS — R531 Weakness: Secondary | ICD-10-CM

## 2015-12-17 DIAGNOSIS — R202 Paresthesia of skin: Secondary | ICD-10-CM

## 2015-12-17 DIAGNOSIS — R413 Other amnesia: Secondary | ICD-10-CM

## 2015-12-17 NOTE — Telephone Encounter (Signed)
New orders placed for pending exam at new location.

## 2015-12-17 NOTE — Telephone Encounter (Signed)
Fabi with [facility] is calling to get a new order for an MRI for the patient. There is an order in but an appointment was scheduled in our office for an MRI but had to be cancelled because the patient has a heart monitor. Fabi cannot use the order that is already in the computer since it was scheduled here and cancelled. If questions please call Fabi at [phone] Ext. [phone].

## 2015-12-17 NOTE — Addendum Note (Signed)
Addended by: Lindell SparKIRKMAN, [name] C on: 12/17/2015 02:24 PM   Modules accepted: Orders

## 2015-12-18 ENCOUNTER — Other Ambulatory Visit: Payer: BC Managed Care – PPO

## 2015-12-25 ENCOUNTER — Other Ambulatory Visit: Payer: Self-pay

## 2015-12-30 ENCOUNTER — Ambulatory Visit
Admission: RE | Admit: 2015-12-30 | Discharge: 2015-12-30 | Disposition: A | Discharge status: Home / Self Care | Payer: BC Managed Care – PPO | Source: Ambulatory Visit | Attending: Neurology | Admitting: Neurology

## 2015-12-30 DIAGNOSIS — R413 Other amnesia: Secondary | ICD-10-CM

## 2015-12-30 DIAGNOSIS — R202 Paresthesia of skin: Secondary | ICD-10-CM | POA: Diagnosis not present

## 2015-12-30 DIAGNOSIS — E1142 Type 2 diabetes mellitus with diabetic polyneuropathy: Secondary | ICD-10-CM

## 2015-12-30 DIAGNOSIS — R531 Weakness: Secondary | ICD-10-CM

## 2015-12-30 DIAGNOSIS — Z8673 Personal history of transient ischemic attack (TIA), and cerebral infarction without residual deficits: Secondary | ICD-10-CM

## 2015-12-30 NOTE — Telephone Encounter (Signed)
Please call patient MRI of the brain showed no significant changes, continued evidence of previous stroke, small left frontal meningioma, I will review imaging findings with her at next follow-up visit  IMPRESSION:  This MRI of the brain with and without contrast shows the following: 1.    Chronic ischemic strokes involving the cerebellar hemispheres, right putamen, right thalamus, right parietal lobe and left frontal lobe. Additionally, there are scattered T2/FLAIR hyperintense foci consistent with chronic microvascular ischemic changes.   All of the strokes in the microvascular changes were present on MRI dated 06/06/2013. The frontal stroke that was acute in 2015 shows expected evolution. 2.   Extra-axial mass consistent with meningioma in the left frontal region. It is unchanged when compared to the prior MRI. 3.   There are no acute findings.

## 2015-12-31 NOTE — Telephone Encounter (Signed)
Called home number - no answer or machine.  Called cell number and left message for a return call.

## 2015-12-31 NOTE — Telephone Encounter (Signed)
Spoke to patient - she is aware of results and will keep her follow up appt to further review.

## 2016-01-27 ENCOUNTER — Telehealth: Payer: Self-pay | Admitting: *Deleted

## 2016-01-27 ENCOUNTER — Ambulatory Visit (INDEPENDENT_AMBULATORY_CARE_PROVIDER_SITE_OTHER): Payer: BC Managed Care – PPO | Admitting: Neurology

## 2016-01-27 DIAGNOSIS — Z8673 Personal history of transient ischemic attack (TIA), and cerebral infarction without residual deficits: Secondary | ICD-10-CM

## 2016-01-27 NOTE — Telephone Encounter (Signed)
No showed follow up appointment. 

## 2016-02-03 NOTE — Progress Notes (Signed)
No show

## 2016-02-06 NOTE — Telephone Encounter (Signed)
I left message with the patient yesterday and spoke with her this morning. The cancellation of the appointment was a misunderstanding. I gave the patient a one time no show fee waive. Thanks Angie

## 2016-02-06 NOTE — Telephone Encounter (Signed)
Pt called in to make appt. She has been scheduled for Jan 8th.

## 2016-02-06 NOTE — Telephone Encounter (Signed)
The pt was transferred to operator by Karoline CaldwellAngie, no show fee waived. She wants to see Dr Terrace ArabiaYan in November, she does not want to see a NP. Please call to schedule. Thank you.

## 2016-02-06 NOTE — Telephone Encounter (Signed)
Patient called requesting to speak to nurse, states she had MRI at the recommendation of Dr. Terrace ArabiaYan, "was rescheduled several times", nurse called back with results, "I told her, I'm going to find someone to drive me there to make appointment, don't schedule any appointments for me, I called yesterday to schedule appointment and was told I missed an appointment and was transferred to billing, I never made this appointment, who scheduled this appointment?, I've called billing 3 times, my health is such, I need to see Dr. Terrace ArabiaYan, I do need to see her or I will need to find another neurologist".

## 2016-03-08 ENCOUNTER — Emergency Department (HOSPITAL_COMMUNITY)
Admission: EM | Admit: 2016-03-08 | Discharge: 2016-03-08 | Disposition: A | Discharge status: Home / Self Care | Payer: BC Managed Care – PPO | Attending: Emergency Medicine | Admitting: Emergency Medicine

## 2016-03-08 ENCOUNTER — Emergency Department (HOSPITAL_COMMUNITY): Payer: BC Managed Care – PPO

## 2016-03-08 ENCOUNTER — Encounter (HOSPITAL_COMMUNITY): Payer: Self-pay | Admitting: Emergency Medicine

## 2016-03-08 DIAGNOSIS — Z7984 Long term (current) use of oral hypoglycemic drugs: Secondary | ICD-10-CM | POA: Diagnosis not present

## 2016-03-08 DIAGNOSIS — E1140 Type 2 diabetes mellitus with diabetic neuropathy, unspecified: Secondary | ICD-10-CM | POA: Diagnosis not present

## 2016-03-08 DIAGNOSIS — Z79899 Other long term (current) drug therapy: Secondary | ICD-10-CM | POA: Insufficient documentation

## 2016-03-08 DIAGNOSIS — R112 Nausea with vomiting, unspecified: Secondary | ICD-10-CM

## 2016-03-08 DIAGNOSIS — I119 Hypertensive heart disease without heart failure: Secondary | ICD-10-CM | POA: Diagnosis not present

## 2016-03-08 DIAGNOSIS — Z8673 Personal history of transient ischemic attack (TIA), and cerebral infarction without residual deficits: Secondary | ICD-10-CM | POA: Diagnosis not present

## 2016-03-08 DIAGNOSIS — Z7982 Long term (current) use of aspirin: Secondary | ICD-10-CM | POA: Diagnosis not present

## 2016-03-08 HISTORY — DX: Essential (primary) hypertension: I10

## 2016-03-08 LAB — CBC
HCT: 40.6 % (ref 36.0–46.0)
HEMOGLOBIN: 14.2 g/dL (ref 12.0–15.0)
MCH: 29 pg (ref 26.0–34.0)
MCHC: 35 g/dL (ref 30.0–36.0)
MCV: 82.9 fL (ref 78.0–100.0)
PLATELETS: 202 10*3/uL (ref 150–400)
RBC: 4.9 MIL/uL (ref 3.87–5.11)
RDW: 12.6 % (ref 11.5–15.5)
WBC: 21.3 10*3/uL — ABNORMAL HIGH (ref 4.0–10.5)

## 2016-03-08 LAB — COMPREHENSIVE METABOLIC PANEL
ALT: 27 U/L (ref 14–54)
AST: 21 U/L (ref 15–41)
Albumin: 4.6 g/dL (ref 3.5–5.0)
Alkaline Phosphatase: 78 U/L (ref 38–126)
Anion gap: 13 (ref 5–15)
BILIRUBIN TOTAL: 1.3 mg/dL — AB (ref 0.3–1.2)
BUN: 16 mg/dL (ref 6–20)
CO2: 26 mmol/L (ref 22–32)
Calcium: 9.3 mg/dL (ref 8.9–10.3)
Chloride: 92 mmol/L — ABNORMAL LOW (ref 101–111)
Creatinine, Ser: 0.83 mg/dL (ref 0.44–1.00)
GFR calc Af Amer: >60 mL/min (ref >60)
Glucose, Bld: 248 mg/dL — ABNORMAL HIGH (ref 65–99)
POTASSIUM: 3.5 mmol/L (ref 3.5–5.1)
Sodium: 131 mmol/L — ABNORMAL LOW (ref 135–145)
TOTAL PROTEIN: 8.1 g/dL (ref 6.5–8.1)

## 2016-03-08 LAB — I-STAT TROPONIN, ED: TROPONIN I, POC: 0 ng/mL (ref 0.00–0.08)

## 2016-03-08 LAB — LIPASE, BLOOD: LIPASE: 11 U/L (ref 11–51)

## 2016-03-08 MED ORDER — SODIUM CHLORIDE 0.9 % IV BOLUS (SEPSIS)
1000.0000 mL | Freq: Once | INTRAVENOUS | Status: AC
  Administered 2016-03-08: 1000 mL via INTRAVENOUS

## 2016-03-08 MED ORDER — MORPHINE SULFATE (PF) 2 MG/ML IV SOLN
2.0000 mg | Freq: Once | INTRAVENOUS | Status: AC
  Administered 2016-03-08: 2 mg via INTRAVENOUS
  Filled 2016-03-08: qty 1

## 2016-03-08 MED ORDER — ONDANSETRON 4 MG PO TBDP: ORAL_TABLET | ORAL | 0 refills | Status: DC

## 2016-03-08 MED ORDER — ONDANSETRON HCL 4 MG/2ML IJ SOLN
4.0000 mg | Freq: Once | INTRAMUSCULAR | Status: AC
  Administered 2016-03-08: 4 mg via INTRAVENOUS
  Filled 2016-03-08: qty 2

## 2016-03-08 MED ORDER — GI COCKTAIL ~~LOC~~
30.0000 mL | Freq: Once | ORAL | Status: AC
  Administered 2016-03-08: 30 mL via ORAL
  Filled 2016-03-08: qty 30

## 2016-03-08 MED ORDER — OXYCODONE HCL 5 MG PO TABS: 2.5000 mg | ORAL_TABLET | ORAL | 0 refills | Status: DC | PRN

## 2016-03-08 MED ORDER — ASPIRIN 81 MG PO CHEW: 324.0000 mg | CHEWABLE_TABLET | Freq: Once | ORAL | Status: DC

## 2016-03-08 MED ORDER — NITROGLYCERIN 0.4 MG SL SUBL: 0.4000 mg | SUBLINGUAL_TABLET | SUBLINGUAL | Status: DC | PRN

## 2016-03-08 NOTE — ED Triage Notes (Signed)
Patient c/o vomiting x 2 days. patient having central chest pain since yesterday and back pain. Patient describes chest pain as squeezing.

## 2016-03-08 NOTE — ED Provider Notes (Signed)
[facility]-EMERGENCY DEPT Provider Note   CSN: [id number] Arrival date & time: 03/08/16  0901     History   Chief Complaint Chief Complaint  Patient presents with  . Chest Pain  . Emesis  . Back Pain    HPI Marie [name] is a 60 y.o. female.  60 yo F with a chief complaint of nausea and vomiting. Going on for the past couple days. After having some evidence that she is now having chest pain that goes into her back. Worse with vomiting. Does not have the pain at baseline. Denies fevers or chills. Has been having trouble tolerating by mouth at home. Patient has a loop recorder from recurrent strokes. She denies any exertional symptoms. Denies shortness of breath. Denies lower extremity edema.   The history is provided by the patient and a friend.  Chest Pain   This is a new problem. The current episode started more than 2 days ago. The problem occurs constantly. The problem has been gradually worsening. The pain is associated with coughing (vomiting). The pain is present in the substernal region. The pain is at a severity of 10/10. The pain is moderate. The quality of the pain is described as sharp. Associated symptoms include back pain, nausea and vomiting. Pertinent negatives include no dizziness, no fever, no headaches, no palpitations and no shortness of breath.  Emesis   Pertinent negatives include no arthralgias, no chills, no fever, no headaches and no myalgias.  Back Pain   Associated symptoms include chest pain. Pertinent negatives include no fever, no headaches and no dysuria.    Past Medical History:  Diagnosis Date  . Diabetes (HCC)   . Hyperlipemia   . Hypertension   . Migraines     Patient Active Problem List   Diagnosis Date Noted  . Weakness 07/01/2015  . Diabetic peripheral neuropathy (HCC) 01/24/2015  . Hypertensive heart disease   . History of CVA (cerebrovascular accident) (multiple, recurrent) 06/12/2013  . Cerebral atherosclerosis 06/12/2013  .  Hyperlipidemia   . Migraines   . Diabetes with retinopathy [facility])     Past Surgical History:  Procedure Laterality Date  . CESAREAN SECTION    . LOOP RECORDER IMPLANT N/A 11/29/2013   Procedure: LOOP RECORDER IMPLANT;  Surgeon: Marinus Maw, MD;  Location: [facility] CATH LAB;  Service: Cardiovascular;  Laterality: N/A;  . TEE WITHOUT CARDIOVERSION N/A 08/24/2013   Procedure: TRANSESOPHAGEAL ECHOCARDIOGRAM (TEE);  Surgeon: [name], MD;  Location: [facility] ENDOSCOPY;  Service: Cardiovascular;  Laterality: N/A;    OB History    No data available       Home Medications    Prior to Admission medications   Medication Sig Start Date End Date Taking? Authorizing Provider  ALPRAZolam Prudy Feeler) 0.5 MG tablet Take 1-2 tablets thirty minutes prior to MRI.  May take one additional tablet before entering scanner, if needed.  MUST HAVE DRIVER. 07/14/79  Yes Levert Feinstein, MD  amLODipine (NORVASC) 5 MG tablet TK 1 T PO QD 12/20/15  Yes Historical Provider, MD  aspirin EC 81 MG tablet Take 1 tablet (81 mg total) by mouth daily. 06/07/13  Yes Levert Feinstein, MD  atorvastatin (LIPITOR) 20 MG tablet Take 1 tablet (20 mg total) by mouth daily. Patient taking differently: Take 20 mg by mouth 2 (two) times daily.  01/23/15  Yes Levert Feinstein, MD  cholecalciferol (VITAMIN D) 1000 UNITS tablet Take 1,000 Units by mouth 2 (two) times daily.    Yes Historical Provider, MD  clopidogrel (PLAVIX)  75 MG tablet Take 1 tablet (75 mg total) by mouth daily. 07/10/15  Yes Levert FeinsteinYijun Yan, MD  Coenzyme Q10 (CO Q 10) 100 MG CAPS Take 200 mg by mouth daily.   Yes Historical Provider, MD  Ginkgo Biloba 500 MG CAPS Take 500 mg by mouth daily.    Yes Historical Provider, MD  lisinopril (PRINIVIL,ZESTRIL) 10 MG tablet Take 1 tablet (10 mg total) by mouth daily. 01/23/15  Yes Levert FeinsteinYijun Yan, MD  metFORMIN (GLUCOPHAGE) 500 MG tablet Take 1,000 mg by mouth 2 (two) times daily.  01/15/15  Yes Historical Provider, MD  Multiple Vitamins-Minerals (CENTRUM) tablet Take  1 tablet by mouth daily.   Yes Historical Provider, MD  Omega-3 Fatty Acids (FISH OIL) 1000 MG CAPS Take 1,000 mg by mouth daily.    Yes Historical Provider, MD  ondansetron (ZOFRAN ODT) 4 MG disintegrating tablet 4mg  ODT q4 hours prn nausea/vomit 03/08/16   Melene Planan Gwenlyn Hottinger, DO  oxyCODONE (ROXICODONE) 5 MG immediate release tablet Take 0.5 tablets (2.5 mg total) by mouth every 4 (four) hours as needed for severe pain. 03/08/16   Melene Planan Little Winton, DO  venlafaxine XR (EFFEXOR XR) 37.5 MG 24 hr capsule Take 1 capsule (37.5 mg total) by mouth daily with breakfast. Patient not taking: Reported on 03/08/2016 11/25/15   Levert FeinsteinYijun Yan, MD    Family History Family History  Problem Relation Age of Onset  . Stomach cancer Mother   . High blood pressure Mother   . Diabetes Maternal Grandfather   . High blood pressure Maternal Grandfather     Social History Social History  Substance Use Topics  . Smoking status: Never Smoker  . Smokeless tobacco: Never Used  . Alcohol use No     Comment: Social     Allergies   Penicillins and Other   Review of Systems Review of Systems  Constitutional: Negative for chills and fever.  HENT: Negative for congestion and rhinorrhea.   Eyes: Negative for redness and visual disturbance.  Respiratory: Negative for shortness of breath and wheezing.   Cardiovascular: Positive for chest pain. Negative for palpitations.  Gastrointestinal: Positive for nausea and vomiting.  Genitourinary: Negative for dysuria and urgency.  Musculoskeletal: Positive for back pain. Negative for arthralgias and myalgias.  Skin: Negative for pallor and wound.  Neurological: Negative for dizziness and headaches.     Physical Exam Updated Vital Signs BP 164/82   Pulse 103   Temp 98.1 F (36.7 C) (Oral)   Resp 24   Ht 5' 2.5" (1.588 m)   Wt 124 lb (56.2 kg)   SpO2 99%   BMI 22.32 kg/m   Physical Exam  Constitutional: She is oriented to person, place, and time. She appears well-developed and  well-nourished. No distress.  HENT:  Head: Normocephalic and atraumatic.  Eyes: EOM are normal. Pupils are equal, round, and reactive to light.  Neck: Normal range of motion. Neck supple.  Cardiovascular: Normal rate and regular rhythm.  Exam reveals no gallop and no friction rub.   No murmur heard. Pulmonary/Chest: Effort normal. She has no wheezes. She has no rales.  Abdominal: Soft. She exhibits no distension and no mass. There is tenderness (epigastric, negative murphys). There is no guarding.  Musculoskeletal: She exhibits no edema or tenderness.  Neurological: She is alert and oriented to person, place, and time.  Skin: Skin is warm and dry. She is not diaphoretic.  Psychiatric: She has a normal mood and affect. Her behavior is normal.  Nursing note and vitals reviewed.  ED Treatments / Results  Labs (all labs ordered are listed, but only abnormal results are displayed) Labs Reviewed  CBC - Abnormal; Notable for the following:       Result Value   WBC 21.3 (*)    All other components within normal limits  COMPREHENSIVE METABOLIC PANEL - Abnormal; Notable for the following:    Sodium 131 (*)    Chloride 92 (*)    Glucose, Bld 248 (*)    Total Bilirubin 1.3 (*)    All other components within normal limits  LIPASE, BLOOD  I-STAT TROPOININ, ED    EKG  EKG Interpretation  Date/Time:  Sunday March 08 2016 09:08:14 EST Ventricular Rate:  114 PR Interval:    QRS Duration: 93 QT Interval:  327 QTC Calculation: 451 R Axis:   75 Text Interpretation:  Sinus tachycardia Probable left atrial enlargement Probable anterolateral infarct, old Baseline wander in lead(s) V6 No old tracing to compare Confirmed by Alexius Hangartner MD, DANIEL ([id number]) on 03/08/2016 9:21:48 AM       Radiology Dg Chest 2 View  Result Date: 03/08/2016 CLINICAL DATA:  Vomiting for 2 days. Central chest pain. Hypertension. EXAM: CHEST  2 VIEW COMPARISON:  None. FINDINGS: The heart size and mediastinal contours  are within normal limits. Both lungs are clear. The visualized skeletal structures are unremarkable. Loop recorder. IMPRESSION: No active cardiopulmonary disease. Electronically Signed   By: John T Curnes M.D.   On: 03/08/2016 09:57    Procedures Procedures (including critical care time)  Medications Ordered in ED Medications  sodium chloride 0.9 % bolus 1,000 mL (1,000 mLs Intravenous New Bag/Given 03/08/16 1024)  gi cocktail (Maalox,Lidocaine,Donnatal) (30 mLs Oral Given 03/08/16 1025)  morphine 2 MG/ML injection 2 mg (2 mg Intravenous Given 03/08/16 1024)  ondansetron (ZOFRAN) injection 4 mg (4 mg Intravenous Given 03/08/16 1024)     Initial Impression / Assessment and Plan / ED Course  I have reviewed the triage vital signs and the nursing notes.  Pertinent labs & imaging results that were available during my care of the patient were reviewed by me and considered in my medical decision making (see chart for details).  Clinical Course     60  yo F With a chief complaint of nausea and vomiting. Going on for the past couple days. Patient now having some chest pain that comes and goes. I suspect this is likely gastric in nature. Feel that esophageal rupture is unlikely with no continuous pain. Will treat the patient symptomatically give IV fluids CBC CMP lipase reassess.  The patient feels much better. She is able to tolerate by mouth without difficulty. Patient's labs with a leukocytosis but no other significant finding.   11:12 AM:  I have discussed the diagnosis/risks/treatment options with the patient and family and believe the pt to be eligible for discharge home to follow-up with PCP. We also discussed returning to the ED immediately if new or worsening sx occur. We discussed the sx which are most concerning (e.g., sudden worsening pain, fever, inability to tolerate by mouth) that necessitate immediate return. Medications administered to the patient during their visit and any new  prescriptions provided to the patient are listed below.  Medications given during this visit Medications  sodium chloride 0.9 % bolus 1,000 mL (1,000 mLs Intravenous New Bag/Given 03/08/16 1024)  gi cocktail (Maalox,Lidocaine,Donnatal) (30 mLs Oral Given 03/08/16 1025)  morphine 2 MG/ML injection 2 mg (2 mg Intravenous Given 03/08/16 1024)  ondansetron (ZOFRAN) injection 4 mg (4 mg  Intravenous Given 03/08/16 1024)     The patient appears reasonably screen and/or stabilized for discharge and I doubt any other medical condition or other [facility] requiring further screening, evaluation, or treatment in the ED at this time prior to discharge.    Final Clinical Impressions(s) / ED Diagnoses   Final diagnoses:  Nausea and vomiting, intractability of vomiting not specified, unspecified vomiting type    New Prescriptions New Prescriptions   ONDANSETRON (ZOFRAN ODT) 4 MG DISINTEGRATING TABLET    4mg  ODT q4 hours prn nausea/vomit   OXYCODONE (ROXICODONE) 5 MG IMMEDIATE RELEASE TABLET    Take 0.5 tablets (2.5 mg total) by mouth every 4 (four) hours as needed for severe pain.     Melene Plan, DO 03/08/16 1112

## 2016-03-08 NOTE — ED Notes (Signed)
ED Provider at bedside. 

## 2016-03-08 NOTE — ED Notes (Signed)
Pt given sprite to drink per Dr Adela LankFloyd

## 2016-03-08 NOTE — ED Notes (Signed)
Patient transported to X-ray 

## 2016-03-08 NOTE — ED Notes (Signed)
TOLERATING PO FLUIDS.  

## 2016-03-08 NOTE — ED Notes (Signed)
RX X 2 GIVEN 

## 2016-03-12 ENCOUNTER — Inpatient Hospital Stay (HOSPITAL_COMMUNITY)
Admission: EM | Admit: 2016-03-12 | Discharge: 2016-03-17 | DRG: 445 | Disposition: A | Discharge status: Home / Self Care | Payer: BC Managed Care – PPO | Attending: Family Medicine | Admitting: Family Medicine

## 2016-03-12 ENCOUNTER — Encounter (HOSPITAL_COMMUNITY): Payer: Self-pay | Admitting: Emergency Medicine

## 2016-03-12 ENCOUNTER — Emergency Department (HOSPITAL_COMMUNITY): Payer: BC Managed Care – PPO

## 2016-03-12 DIAGNOSIS — Z7902 Long term (current) use of antithrombotics/antiplatelets: Secondary | ICD-10-CM

## 2016-03-12 DIAGNOSIS — G43909 Migraine, unspecified, not intractable, without status migrainosus: Secondary | ICD-10-CM | POA: Diagnosis present

## 2016-03-12 DIAGNOSIS — E876 Hypokalemia: Secondary | ICD-10-CM | POA: Diagnosis present

## 2016-03-12 DIAGNOSIS — K8000 Calculus of gallbladder with acute cholecystitis without obstruction: Principal | ICD-10-CM | POA: Diagnosis present

## 2016-03-12 DIAGNOSIS — Z7982 Long term (current) use of aspirin: Secondary | ICD-10-CM

## 2016-03-12 DIAGNOSIS — Z8673 Personal history of transient ischemic attack (TIA), and cerebral infarction without residual deficits: Secondary | ICD-10-CM

## 2016-03-12 DIAGNOSIS — D649 Anemia, unspecified: Secondary | ICD-10-CM | POA: Diagnosis present

## 2016-03-12 DIAGNOSIS — N838 Other noninflammatory disorders of ovary, fallopian tube and broad ligament: Secondary | ICD-10-CM | POA: Diagnosis not present

## 2016-03-12 DIAGNOSIS — Z880 Allergy status to penicillin: Secondary | ICD-10-CM | POA: Diagnosis not present

## 2016-03-12 DIAGNOSIS — Z833 Family history of diabetes mellitus: Secondary | ICD-10-CM

## 2016-03-12 DIAGNOSIS — Z8249 Family history of ischemic heart disease and other diseases of the circulatory system: Secondary | ICD-10-CM

## 2016-03-12 DIAGNOSIS — E860 Dehydration: Secondary | ICD-10-CM | POA: Diagnosis present

## 2016-03-12 DIAGNOSIS — E785 Hyperlipidemia, unspecified: Secondary | ICD-10-CM | POA: Diagnosis present

## 2016-03-12 DIAGNOSIS — I10 Essential (primary) hypertension: Secondary | ICD-10-CM | POA: Diagnosis present

## 2016-03-12 DIAGNOSIS — K819 Cholecystitis, unspecified: Secondary | ICD-10-CM | POA: Insufficient documentation

## 2016-03-12 DIAGNOSIS — Z7984 Long term (current) use of oral hypoglycemic drugs: Secondary | ICD-10-CM

## 2016-03-12 DIAGNOSIS — E1142 Type 2 diabetes mellitus with diabetic polyneuropathy: Secondary | ICD-10-CM | POA: Diagnosis present

## 2016-03-12 DIAGNOSIS — D72829 Elevated white blood cell count, unspecified: Secondary | ICD-10-CM | POA: Diagnosis present

## 2016-03-12 DIAGNOSIS — K810 Acute cholecystitis: Secondary | ICD-10-CM | POA: Diagnosis not present

## 2016-03-12 DIAGNOSIS — E1165 Type 2 diabetes mellitus with hyperglycemia: Secondary | ICD-10-CM | POA: Diagnosis present

## 2016-03-12 DIAGNOSIS — Z91018 Allergy to other foods: Secondary | ICD-10-CM

## 2016-03-12 DIAGNOSIS — I672 Cerebral atherosclerosis: Secondary | ICD-10-CM | POA: Diagnosis present

## 2016-03-12 DIAGNOSIS — Z8279 Family history of other congenital malformations, deformations and chromosomal abnormalities: Secondary | ICD-10-CM | POA: Diagnosis not present

## 2016-03-12 DIAGNOSIS — Z800 Family history of malignant neoplasm of digestive organs: Secondary | ICD-10-CM

## 2016-03-12 DIAGNOSIS — E871 Hypo-osmolality and hyponatremia: Secondary | ICD-10-CM | POA: Diagnosis present

## 2016-03-12 DIAGNOSIS — R739 Hyperglycemia, unspecified: Secondary | ICD-10-CM

## 2016-03-12 DIAGNOSIS — E11319 Type 2 diabetes mellitus with unspecified diabetic retinopathy without macular edema: Secondary | ICD-10-CM | POA: Diagnosis not present

## 2016-03-12 DIAGNOSIS — R112 Nausea with vomiting, unspecified: Secondary | ICD-10-CM

## 2016-03-12 HISTORY — DX: Depression, unspecified: F32.A

## 2016-03-12 HISTORY — DX: Cerebral infarction, unspecified: I63.9

## 2016-03-12 HISTORY — DX: Major depressive disorder, single episode, unspecified: F32.9

## 2016-03-12 LAB — BASIC METABOLIC PANEL
Anion gap: 10 (ref 5–15)
BUN: 14 mg/dL (ref 6–20)
CHLORIDE: 91 mmol/L — AB (ref 101–111)
CO2: 28 mmol/L (ref 22–32)
CREATININE: 0.79 mg/dL (ref 0.44–1.00)
Calcium: 8.6 mg/dL — ABNORMAL LOW (ref 8.9–10.3)
GFR calc non Af Amer: >60 mL/min (ref >60)
Glucose, Bld: 227 mg/dL — ABNORMAL HIGH (ref 65–99)
POTASSIUM: 3.1 mmol/L — AB (ref 3.5–5.1)
SODIUM: 129 mmol/L — AB (ref 135–145)

## 2016-03-12 LAB — HEPATIC FUNCTION PANEL
ALBUMIN: 2.3 g/dL — AB (ref 3.5–5.0)
ALK PHOS: 103 U/L (ref 38–126)
ALT: 36 U/L (ref 14–54)
AST: 20 U/L (ref 15–41)
BILIRUBIN DIRECT: 0.3 mg/dL (ref 0.1–0.5)
BILIRUBIN INDIRECT: 0.7 mg/dL (ref 0.3–0.9)
BILIRUBIN TOTAL: 1 mg/dL (ref 0.3–1.2)
Total Protein: 5.5 g/dL — ABNORMAL LOW (ref 6.5–8.1)

## 2016-03-12 LAB — CBC
HEMATOCRIT: 35.2 % — AB (ref 36.0–46.0)
HEMOGLOBIN: 11.8 g/dL — AB (ref 12.0–15.0)
MCH: 28.1 pg (ref 26.0–34.0)
MCHC: 33.5 g/dL (ref 30.0–36.0)
MCV: 83.8 fL (ref 78.0–100.0)
PLATELETS: 186 10*3/uL (ref 150–400)
RBC: 4.2 MIL/uL (ref 3.87–5.11)
RDW: 12.5 % (ref 11.5–15.5)
WBC: 12.9 10*3/uL — AB (ref 4.0–10.5)

## 2016-03-12 LAB — URINALYSIS, ROUTINE W REFLEX MICROSCOPIC
BILIRUBIN URINE: NEGATIVE
GLUCOSE, UA: 150 mg/dL — AB
Ketones, ur: 5 mg/dL — AB
Nitrite: NEGATIVE
PROTEIN: 30 mg/dL — AB
Specific Gravity, Urine: 1.02 (ref 1.005–1.030)
pH: 6 (ref 5.0–8.0)

## 2016-03-12 LAB — TROPONIN I: Troponin I: <0.03 ng/mL (ref <0.03)

## 2016-03-12 MED ORDER — SODIUM CHLORIDE 0.9 % IV BOLUS (SEPSIS)
1000.0000 mL | Freq: Once | INTRAVENOUS | Status: AC
  Administered 2016-03-12: 1000 mL via INTRAVENOUS

## 2016-03-12 MED ORDER — MORPHINE SULFATE (PF) 4 MG/ML IV SOLN
4.0000 mg | Freq: Once | INTRAVENOUS | Status: AC
  Administered 2016-03-12: 4 mg via INTRAVENOUS
  Filled 2016-03-12: qty 1

## 2016-03-12 MED ORDER — ONDANSETRON HCL 4 MG/2ML IJ SOLN
4.0000 mg | Freq: Once | INTRAMUSCULAR | Status: AC
  Administered 2016-03-12: 4 mg via INTRAVENOUS
  Filled 2016-03-12: qty 2

## 2016-03-12 MED ORDER — DEXTROSE 5 % IV SOLN
2.0000 g | Freq: Once | INTRAVENOUS | Status: AC
  Administered 2016-03-12: 2 g via INTRAVENOUS
  Filled 2016-03-12: qty 2

## 2016-03-12 MED ORDER — SODIUM CHLORIDE 0.9 % IJ SOLN
INTRAMUSCULAR | Status: AC
  Filled 2016-03-12: qty 50

## 2016-03-12 MED ORDER — IOPAMIDOL (ISOVUE-300) INJECTION 61%
INTRAVENOUS | Status: AC
  Filled 2016-03-12: qty 100

## 2016-03-12 MED ORDER — IOPAMIDOL (ISOVUE-300) INJECTION 61%
100.0000 mL | Freq: Once | INTRAVENOUS | Status: AC | PRN
  Administered 2016-03-12: 100 mL via INTRAVENOUS

## 2016-03-12 NOTE — ED Notes (Signed)
Pt assisted to restroom.  

## 2016-03-12 NOTE — Consult Note (Signed)
Re:   Marie RinksFarida [name] DOB:   [birth year] MRN:   [id number]   General Surgery Consultation  ASSESSMENT AND PLAN: 1.  Cholecystitis  Will need cholecystectomy.  I discussed with the patient the indications and risks of gall bladder surgery.  The primary risks of gall bladder surgery include, but are not limited to, bleeding, infection, common bile duct injury, and open surgery.  There is also the risk that the patient may have continued symptoms after surgery.  We discussed the typical post-operative recovery course. I tried to answer the patient's questions.  Dr. Daphine DeutscherMartin is our surgeon of the week.  He will decide the timing of surgery - possibly Friday.  By her history, she said that the last time she took Plavix was Thursday, 11/30, because she has been sick since then.  If Dr. Daphine DeutscherMartin cannot do her surgery Friday, she could get bumped into the weekend.  2.  Left ovarian prominence  I gave her a copy of her CT scan report  Will need non emergent follow up  3.  History of stroke - left MCA territory  06/2013  Followed by Dr. Terrace ArabiaYan in neurology. 4.  On Plavix  To hold plavix in anticipation of surgery.  It is best if she is off for at least 3 days.  She claims to have last taken this on 03/05/2016, because of her illness 5.  DM  With retinopathy (which is better) and peripheral neuropathy (mainly hands) 6.  HTN  Chief Complaint  Patient presents with  . Weakness  . Nausea   PHYSICIAN REQUESTING CONSULTATION:  Dr. Jacalyn LefevreJulie Haviland, [facility]  HISTORY OF PRESENT ILLNESS: Marie [name] is a 60 y.o. (DOB: [birth year])  Asian female whose primary care physician is Marie AlkenBARNES,Marie STEWART, MD and [facility] pain.  Note she was seen in the ER on 03/08/2016 by Dr. Edward Jolly. [name] who thought she has a gastric problem. But after discharge, her symptoms did not improve.  She has had continued chest pain and pain radiating to her back.  She saw Dr. Zachery DauerBarnes today, who then had her  return to the ER.  She said that the last 2 days, she has not been able to eat anything.  No history of stomach disease, liver disease, pancreas disease, or colon disease.  Never had colonoscopy.  CT scan 03/12/2016 - 1. Acute cholecystitis.  No biliary dilatation.  2. Insula finding of left ovarian prominence for age post menopausal status during 3.8 cm with possible cystic component. Recommend nonemergent pelvic ultrasound after coalescence of acute illness.  3. Abdominal atherosclerosis, no aneurysm.  Uterine fibroids.    Past Medical History:  Diagnosis Date  . Diabetes (HCC)   . Hyperlipemia   . Hypertension   . Migraines       Past Surgical History:  Procedure Laterality Date  . CESAREAN SECTION    . LOOP RECORDER IMPLANT N/A 11/29/2013   Procedure: LOOP RECORDER IMPLANT;  Surgeon: Marie MawGregg W Taylor, MD;  [facility];  Service: Cardiovascular;  Laterality: N/A;  . TEE WITHOUT CARDIOVERSION N/A 08/24/2013   Procedure: TRANSESOPHAGEAL ECHOCARDIOGRAM (TEE);  Surgeon: Marie FairMihai Croitoru, MD;  [facility];  Service: Cardiovascular;  Laterality: N/A;      Current Facility-Administered Medications  Medication Dose Route Frequency Provider Last Rate Last Dose  . cefTRIAXone (ROCEPHIN) 2 g in dextrose 5 % 50 mL IVPB  2 g Intravenous Once Jacalyn LefevreJulie Haviland, MD 100 mL/hr at 03/12/16 2247 2 g at  03/12/16 2247  . iopamidol (ISOVUE-300) 61 % injection           . sodium chloride 0.9 % injection            Current Outpatient Prescriptions  Medication Sig Dispense Refill  . amLODipine (NORVASC) 5 MG tablet Take 5 mg by mouth daily  1  . aspirin EC 81 MG tablet Take 1 tablet (81 mg total) by mouth daily. 90 tablet 12  . atorvastatin (LIPITOR) 20 MG tablet Take 1 tablet (20 mg total) by mouth daily. (Patient taking differently: Take 20 mg by mouth 2 (two) times daily. ) 90 tablet 3  . cholecalciferol (VITAMIN D) 1000 UNITS tablet Take 1,000 Units by mouth 2 (two) times daily.       . clopidogrel (PLAVIX) 75 MG tablet Take 1 tablet (75 mg total) by mouth daily. 90 tablet 4  . Coenzyme Q10 (CO Q 10) 100 MG CAPS Take 200 mg by mouth daily.    . Ginkgo Biloba 500 MG CAPS Take 500 mg by mouth daily.     [name] lisinopril (PRINIVIL,ZESTRIL) 10 MG tablet Take 1 tablet (10 mg total) by mouth daily. 90 tablet 3  . metFORMIN (GLUCOPHAGE) 500 MG tablet Take 1,000 mg by mouth 2 (two) times daily.     . Multiple Vitamins-Minerals (CENTRUM) tablet Take 1 tablet by mouth daily.    . Omega-3 Fatty Acids (FISH OIL) 1000 MG CAPS Take 1,000 mg by mouth daily.     . ondansetron (ZOFRAN ODT) 4 MG disintegrating tablet 4mg  ODT q4 hours prn nausea/vomit (Patient taking differently: Take 4 mg by mouth every 4 (four) hours as needed for nausea or vomiting. 4mg  ODT q4 hours prn nausea/vomit) 20 tablet 0  . ALPRAZolam (XANAX) 0.5 MG tablet Take 1-2 tablets thirty minutes prior to MRI.  May take one additional tablet before entering scanner, if needed.  MUST HAVE DRIVER. (Patient not taking: Reported on 03/12/2016) 3 tablet 0  . oxyCODONE (ROXICODONE) 5 MG immediate release tablet Take 0.5 tablets (2.5 mg total) by mouth every 4 (four) hours as needed for severe pain. (Patient not taking: Reported on 03/12/2016) 3 tablet 0  . venlafaxine XR (EFFEXOR XR) 37.5 MG 24 hr capsule Take 1 capsule (37.5 mg total) by mouth daily with breakfast. (Patient not taking: Reported on 03/12/2016) 30 capsule 11      Allergies  Allergen Reactions  . Penicillins Other (See Comments)    Driving couldn't see and head felt heavy  Has patient had a PCN reaction causing immediate rash, facial/tongue/throat swelling, SOB or lightheadedness with hypotension: No Has patient had a PCN reaction causing severe rash involving mucus membranes or skin necrosis: No Has patient had a PCN reaction that required hospitalization: No Has patient had a PCN reaction occurring within the last 10 years: No If all of the above answers are "NO", then  may proceed with Cephalosporin use.   . Other Other (See Comments)    Allergic to beef and eggplant    REVIEW OF SYSTEMS: Skin:  No history of rash.  No history of abnormal moles. Infection:  No history of hepatitis or HIV.  No history of MRSA. Neurologic:  History of CVA - 06/2013 Cardiac:  HTN x 2 years.   Pulmonary:  Does not smoke cigarettes.  No asthma or bronchitis.  No OSA/CPAP.  Endocrine:  DM x 3 years with retinopathy and neuropathy Gastrointestinal:  See HPI Urologic:  No history of kidney stones.  No history  of bladder infections. Musculoskeletal:  No history of joint or back disease. Hematologic:  No bleeding disorder.  No history of anemia.  Not anticoagulated. Psycho-social:  The patient is oriented.   The patient has no obvious psychologic or social impairment to understanding our conversation and plan.  SOCIAL and FAMILY HISTORY:  Married.  Her [place] is in [place].  She does not expect him back for a couple of months. She has lived in the [place]. She has 3 sons.  One son with Down's syndrome lives with her.  She has one son, who is a Clinical research associatelawyer, in [place]. And she has one son, who is in graduate school, at [employer]. She was in the room by herself when I examined her. She stopped working about 1 year ago.  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  PHYSICAL EXAM: BP 117/64   Pulse 88   Resp 16   SpO2 91%   General: Older Asain F who is alert. Skin:  Inspection and palpation - no mass or rash. Eyes:  Conjunctiva and lids unremarkable.            Pupils are equal Ears, Nose, Mouth, and Throat:  Ears and nose unremarkable            Lips and teeth are unremarable. Neck: Supple. No mass, trachea midline.  No thyroid mass. Lymph Nodes:  No supraclavicular, cervical, or inguinal nodes. Lungs: Normal respiratory effort.  Clear to auscultation and symmetric breath sounds. Heart:  Palpation of the heart is normal.             Auscultation: RRR. No murmur or rub.  Abdomen: Soft. No mass.  Some tenderness in her epigastrium and right abdomen.    No abdominal scars. Rectal: Not done. Musculoskeletal:              Good muscle strength and ROM  in upper and lower extremities. Neurologic:  Grossly intact to motor and sensory function. Psychiatric: Normal judgement and insight. Behavior is normal.            Oriented to time, person, place.   DATA REVIEWED, COUNSELING AND COORDINATION OF CARE: Epic notes reviewed. Counseling and coordination of care exceeded more than 50% of the time spent with patient. Total time spent with patient and charting: 45 minutes  Ovidio Kinavid Nekita Pita, MD,  [facility], [place] [address]  [address]   [place], [place]    [id number] Phone:  [phone] FAX:  [phone]

## 2016-03-12 NOTE — ED Notes (Signed)
Patient returned from CT

## 2016-03-12 NOTE — ED Provider Notes (Signed)
[facility]-EMERGENCY DEPT Provider Note   CSN: [id number] Arrival date & time: 03/12/16  1556     History   Chief Complaint Chief Complaint  Patient presents with  . Weakness  . Nausea    HPI Marie [name] is a 60 y.o. female.  Pt presents to the ED today with n/v and abdominal pain.  She was in the ED on 12/3 for the same.  She felt better after that visit, but sx returned.  She had a follow up [facility]'s [facility].  She has still had nausea and vomiting.  She still has abdominal pain.  The pt's pcp sent her back to be re-evaluated in the ED.      Past Medical History:  Diagnosis Date  . Diabetes (HCC)   . Hyperlipemia   . Hypertension   . Migraines     Patient Active Problem List   Diagnosis Date Noted  . Cholecystitis 03/12/2016  . Weakness 07/01/2015  . Diabetic peripheral neuropathy (HCC) 01/24/2015  . Hypertensive heart disease   . History of CVA (cerebrovascular accident) (multiple, recurrent) 06/12/2013  . Cerebral atherosclerosis 06/12/2013  . Hyperlipidemia   . Migraines   . Diabetes with retinopathy [facility](HCC)     Past Surgical History:  Procedure Laterality Date  . CESAREAN SECTION    . LOOP RECORDER IMPLANT N/A 11/29/2013   Procedure: LOOP RECORDER IMPLANT;  Surgeon: Marinus MawGregg W Taylor, MD;  [facility];  Service: Cardiovascular;  Laterality: N/A;  . TEE WITHOUT CARDIOVERSION N/A 08/24/2013   Procedure: TRANSESOPHAGEAL ECHOCARDIOGRAM (TEE);  Surgeon: Thurmon FairMihai Croitoru, MD;  [facility];  Service: Cardiovascular;  Laterality: N/A;    OB History    No data available       Home Medications    Prior to Admission medications   Medication Sig Start Date End Date Taking? Authorizing Provider  amLODipine (NORVASC) 5 MG tablet Take 5 mg by mouth daily 12/20/15  Yes Historical Provider, MD  aspirin EC 81 MG tablet Take 1 tablet (81 mg total) by mouth daily. 06/07/13  Yes Levert FeinsteinYijun Yan, MD  atorvastatin (LIPITOR) 20 MG tablet Take 1 tablet (20 mg  total) by mouth daily. Patient taking differently: Take 20 mg by mouth 2 (two) times daily.  01/23/15  Yes Levert FeinsteinYijun Yan, MD  cholecalciferol (VITAMIN D) 1000 UNITS tablet Take 1,000 Units by mouth 2 (two) times daily.    Yes Historical Provider, MD  clopidogrel (PLAVIX) 75 MG tablet Take 1 tablet (75 mg total) by mouth daily. 07/10/15  Yes Levert FeinsteinYijun Yan, MD  Coenzyme Q10 (CO Q 10) 100 MG CAPS Take 200 mg by mouth daily.   Yes Historical Provider, MD  Ginkgo Biloba 500 MG CAPS Take 500 mg by mouth daily.    Yes Historical Provider, MD  lisinopril (PRINIVIL,ZESTRIL) 10 MG tablet Take 1 tablet (10 mg total) by mouth daily. 01/23/15  Yes Levert FeinsteinYijun Yan, MD  metFORMIN (GLUCOPHAGE) 500 MG tablet Take 1,000 mg by mouth 2 (two) times daily.  01/15/15  Yes Historical Provider, MD  Multiple Vitamins-Minerals (CENTRUM) tablet Take 1 tablet by mouth daily.   Yes Historical Provider, MD  Omega-3 Fatty Acids (FISH OIL) 1000 MG CAPS Take 1,000 mg by mouth daily.    Yes Historical Provider, MD  ondansetron (ZOFRAN ODT) 4 MG disintegrating tablet 4mg  ODT q4 hours prn nausea/vomit Patient taking differently: Take 4 mg by mouth every 4 (four) hours as needed for nausea or vomiting. 4mg  ODT q4 hours prn nausea/vomit 03/08/16  Yes Melene Planan Floyd, DO  ALPRAZolam Prudy Feeler(XANAX) 0.5 MG tablet Take 1-2 tablets thirty minutes prior to MRI.  May take one additional tablet before entering scanner, if needed.  MUST HAVE DRIVER. Patient not taking: Reported on 03/12/2016 12/05/15   Levert FeinsteinYijun Yan, MD  oxyCODONE (ROXICODONE) 5 MG immediate release tablet Take 0.5 tablets (2.5 mg total) by mouth every 4 (four) hours as needed for severe pain. Patient not taking: Reported on 03/12/2016 03/08/16   Melene Planan Floyd, DO  venlafaxine XR (EFFEXOR XR) 37.5 MG 24 hr capsule Take 1 capsule (37.5 mg total) by mouth daily with breakfast. Patient not taking: Reported on 03/12/2016 11/25/15   Levert FeinsteinYijun Yan, MD    Family History Family History  Problem Relation Age of Onset  . Stomach  cancer Mother   . High blood pressure Mother   . Diabetes Maternal Grandfather   . High blood pressure Maternal Grandfather     Social History Social History  Substance Use Topics  . Smoking status: Never Smoker  . Smokeless tobacco: Never Used  . Alcohol use No     Comment: Social     Allergies   Penicillins and Other   Review of Systems Review of Systems  Gastrointestinal: Positive for abdominal pain, nausea and vomiting.  All other systems reviewed and are negative.    Physical Exam Updated Vital Signs BP 124/67   Pulse 90   Resp 16   SpO2 96%   Physical Exam  Constitutional: She is oriented to person, place, and time. She appears well-developed. She appears distressed.  HENT:  Head: Normocephalic and atraumatic.  Right Ear: External ear normal.  Left Ear: External ear normal.  Nose: Nose normal.  Mouth/Throat: Oropharynx is clear and moist.  Eyes: Conjunctivae and EOM are normal. Pupils are equal, round, and reactive to light.  Neck: Normal range of motion. Neck supple.  Cardiovascular: Regular rhythm, normal heart sounds and intact distal pulses.  Tachycardia present.   Pulmonary/Chest: Effort normal and breath sounds normal.  Abdominal: Soft. There is tenderness in the right lower quadrant.  Musculoskeletal: Normal range of motion.  Neurological: She is alert and oriented to person, place, and time.  Skin: Skin is warm.  Psychiatric: She has a normal mood and affect. Her behavior is normal. Judgment and thought content normal.  Nursing note and vitals reviewed.    ED Treatments / Results  Labs (all labs ordered are listed, but only abnormal results are displayed) Labs Reviewed  BASIC METABOLIC PANEL - Abnormal; Notable for the following:       Result Value   Sodium 129 (*)    Potassium 3.1 (*)    Chloride 91 (*)    Glucose, Bld 227 (*)    Calcium 8.6 (*)    All other components within normal limits  CBC - Abnormal; Notable for the following:     WBC 12.9 (*)    Hemoglobin 11.8 (*)    HCT 35.2 (*)    All other components within normal limits  URINALYSIS, ROUTINE W REFLEX MICROSCOPIC - Abnormal; Notable for the following:    Color, Urine AMBER (*)    APPearance HAZY (*)    Glucose, UA 150 (*)    Hgb urine dipstick SMALL (*)    Ketones, ur 5 (*)    Protein, ur 30 (*)    Leukocytes, UA SMALL (*)    Bacteria, UA RARE (*)    Squamous Epithelial / LPF 0-5 (*)    All other components within  normal limits  TROPONIN I  HEPATIC FUNCTION PANEL    EKG  EKG Interpretation  Date/Time:  Thursday March 12 2016 16:24:58 EST Ventricular Rate:  110 PR Interval:    QRS Duration: 139 QT Interval:  362 QTC Calculation: 490 R Axis:   37 Text Interpretation:  Sinus tachycardia Atrial premature complex Nonspecific intraventricular conduction delay Inferior infarct, old Confirmed by [facility] MD, Luisa Louk ([id number]) on 03/12/2016 10:06:58 PM       Radiology Ct Abdomen Pelvis W Contrast  Result Date: 03/12/2016 CLINICAL DATA:  Right lower quadrant pain. Vomiting, decreased p.o. intake, right-sided back pain and nausea. EXAM: CT ABDOMEN AND PELVIS WITH CONTRAST TECHNIQUE: Multidetector CT imaging of the abdomen and pelvis was performed using the standard protocol following bolus administration of intravenous contrast. CONTRAST:  ISOVUE-300 IOPAMIDOL (ISOVUE-300) INJECTION 61% COMPARISON:  None. FINDINGS: Lower chest: Right greater than left lower lobe atelectasis. Calcified right lower lobe granuloma. Trace right pleural thickening. Hepatobiliary: Gallbladder is distended containing calcified gallstone with marked pericholecystic inflammation. There is gallbladder wall enhancement and thickening, pericholecystic soft tissue stranding and small amount of fluid. No biliary dilatation or calcified choledocholithiasis. There is a 2.1 cm hypodense lesion in the dome of the right lobe of the liver with peripheral contrast puddling consistent with  hemangioma. Pancreas: Focal calcification in the pancreatic tail. No ductal dilatation or inflammation. Spleen: Normal in size without focal abnormality. Adrenals/Urinary Tract: No adrenal nodule. No hydronephrosis or perinephric edema. Homogeneous enhancement. Tiny cortical hypodensities in the left kidney, too small to characterize but likely cysts. Urinary bladder is distended without wall thickening. Stomach/Bowel: Para pyloric and duodenal wall thickening likely reactive related to adjacent gallbladder inflammation. There is a duodenum diverticulum, no para diverticular inflammation. No small bowel dilatation. No bowel obstruction. Normal appendix. Moderate stool in the right colon, with small volume of stool distally. Vascular/Lymphatic: Abdominal aortic atherosclerosis without aneurysm. Small retroperitoneal lymph nodes are likely reactive. Reproductive: Calcified uterine fibroid, uterus is heterogeneous. The left ovary is prominent size for age measuring at least 3.8 cm with probable small complex cyst. Other: No free air.  No intra- abdominal abscess. Musculoskeletal: Degenerative disc disease at L2-L3. There are no acute or suspicious osseous abnormalities. IMPRESSION: 1. Acute cholecystitis.  No biliary dilatation. 2. Insula finding of left ovarian prominence for age post menopausal status during 3.8 cm with possible cystic component. Recommend nonemergent pelvic ultrasound after coalescence of acute illness. 3. Abdominal atherosclerosis, no aneurysm.  Uterine fibroids. Electronically Signed   By: Rubye Oaks M.D.   On: 03/12/2016 22:27    Procedures Procedures (including critical care time)  Medications Ordered in ED Medications  iopamidol (ISOVUE-300) 61 % injection (not administered)  sodium chloride 0.9 % injection (not administered)  sodium chloride 0.9 % bolus 1,000 mL (0 mLs Intravenous Stopped 03/12/16 2053)  ondansetron (ZOFRAN) injection 4 mg (4 mg Intravenous Given 03/12/16 1940)   morphine 4 MG/ML injection 4 mg (4 mg Intravenous Given 03/12/16 1940)  sodium chloride 0.9 % bolus 1,000 mL (0 mLs Intravenous Stopped 03/12/16 2225)  iopamidol (ISOVUE-300) 61 % injection 100 mL (100 mLs Intravenous Contrast Given 03/12/16 2141)  sodium chloride 0.9 % bolus 1,000 mL (1,000 mLs Intravenous New Bag/Given 03/12/16 2229)  cefTRIAXone (ROCEPHIN) 2 g in dextrose 5 % 50 mL IVPB (2 g Intravenous New Bag/Given 03/12/16 2247)     Initial Impression / Assessment and Plan / ED Course  I have reviewed the triage vital signs and the nursing notes.  Pertinent labs &  imaging results that were available during my care of the patient were reviewed by me and considered in my medical decision making (see chart for details).  Clinical Course    Pt d/w Dr. Ezzard Standing (general surgery) who will consult.  Due to pt's multiple medical problems, he requests that the hospitalists admit.  He said to hold the plavix.  Pt d/w Dr. Katrinka Blazing (triad) who will admit.  Final Clinical Impressions(s) / ED Diagnoses   Final diagnoses:  Acute cholecystitis  Dehydration  Hypokalemia  Hyponatremia  Hyperglycemia  Non-intractable vomiting with nausea, unspecified vomiting type    New Prescriptions New Prescriptions   No medications on file     Jacalyn Lefevre, MD 03/12/16 2325

## 2016-03-12 NOTE — H&P (Signed)
History and Physical    Marie [name] WUJ:[id number] DOB: [birth year] DOA: 03/12/2016  Referring MD/NP/PA: Dr. Particia Nearing PCP: Gaye Alken, MD  Patient coming from: PCP office  Chief Complaint:   HPI: Marie [name] is a 60 y.o. female with medical history significant of HTN, HLD, DM type II, CVA; who presents with at least a 5 day history of abdominal pain, nausea, and vomiting. Patient describes a sharp epigastric to right quadrant abdominal pain that waxes and wanes in intensity. Pain radiates up into her chest and into her low back. Movement seems to worsen symptoms. Previously Evaluated 4 days ago in the ED for same symptoms, but was discharged home with antiemetics and pain medication to follow-up with her PCP. Patient reports symptoms were not significantly relieved with medications given and have progressively worsened. She reports over the last 5 days or so she is not been able to eat much of anything and has not had a bowel movement. Denies having any fever, chills, or shortness of breath. She was unable to follow-up with her PCP until today. Patient   ED Course: Patient was found to have abnormal UA. CT imaging of the abdomen revealed acute cholecystitis. She was given 2 g of IV Rocephin due to penicillin allergy history. Dr. Ezzard Standing of general surgery was consulted and recommended due to the patient's comorbid duties to admit to [facility].  Review of Systems: As per HPI otherwise 10 point review of systems negative.   Past Medical History:  Diagnosis Date  . Diabetes (HCC)   . Hyperlipemia   . Hypertension   . Migraines     Past Surgical History:  Procedure Laterality Date  . CESAREAN SECTION    . LOOP RECORDER IMPLANT N/A 11/29/2013   Procedure: LOOP RECORDER IMPLANT;  Surgeon: Marinus Maw, MD;  Location: [facility] CATH LAB;  Service: Cardiovascular;  Laterality: N/A;  . TEE WITHOUT CARDIOVERSION N/A 08/24/2013   Procedure: TRANSESOPHAGEAL ECHOCARDIOGRAM (TEE);  Surgeon: [name], MD;  Location: [facility] ENDOSCOPY;  Service: Cardiovascular;  Laterality: N/A;     reports that she has never smoked. She has never used smokeless tobacco. She reports that she does not drink alcohol or use drugs.  Allergies  Allergen Reactions  . Penicillins Other (See Comments)    Driving couldn't see and head felt heavy  Has patient had a PCN reaction causing immediate rash, facial/tongue/throat swelling, SOB or lightheadedness with hypotension: No Has patient had a PCN reaction causing severe rash involving mucus membranes or skin necrosis: No Has patient had a PCN reaction that required hospitalization: No Has patient had a PCN reaction occurring within the last 10 years: No If all of the above answers are "NO", then may proceed with Cephalosporin use.   . Other Other (See Comments)    Allergic to beef and eggplant    Family History  Problem Relation Age of Onset  . Stomach cancer Mother   . High blood pressure Mother   . Diabetes Maternal Grandfather   . High blood pressure Maternal Grandfather     Prior to Admission medications   Medication Sig Start Date End Date Taking? Authorizing Provider  amLODipine (NORVASC) 5 MG tablet Take 5 mg by mouth daily 12/20/15  Yes Historical Provider, MD  aspirin EC 81 MG tablet Take 1 tablet (81 mg total) by mouth daily. 06/07/13  Yes Levert Feinstein, MD  atorvastatin (LIPITOR) 20 MG tablet Take 1 tablet (20 mg total) by mouth daily. Patient taking differently: Take 20 mg by  mouth 2 (two) times daily.  01/23/15  Yes Levert Feinstein, MD  cholecalciferol (VITAMIN D) 1000 UNITS tablet Take 1,000 Units by mouth 2 (two) times daily.    Yes Historical Provider, MD  clopidogrel (PLAVIX) 75 MG tablet Take 1 tablet (75 mg total) by mouth daily. 07/10/15  Yes Levert Feinstein, MD  Coenzyme Q10 (CO Q 10) 100 MG CAPS Take 200 mg by mouth daily.   Yes Historical Provider, MD  Ginkgo Biloba 500 MG CAPS Take 500 mg by mouth daily.    Yes Historical Provider, MD  lisinopril  (PRINIVIL,ZESTRIL) 10 MG tablet Take 1 tablet (10 mg total) by mouth daily. 01/23/15  Yes Levert Feinstein, MD  metFORMIN (GLUCOPHAGE) 500 MG tablet Take 1,000 mg by mouth 2 (two) times daily.  01/15/15  Yes Historical Provider, MD  Multiple Vitamins-Minerals (CENTRUM) tablet Take 1 tablet by mouth daily.   Yes Historical Provider, MD  Omega-3 Fatty Acids (FISH OIL) 1000 MG CAPS Take 1,000 mg by mouth daily.    Yes Historical Provider, MD  ondansetron (ZOFRAN ODT) 4 MG disintegrating tablet 4mg  ODT q4 hours prn nausea/vomit Patient taking differently: Take 4 mg by mouth every 4 (four) hours as needed for nausea or vomiting. 4mg  ODT q4 hours prn nausea/vomit 03/08/16  Yes Melene Plan, DO  ALPRAZolam Prudy Feeler) 0.5 MG tablet Take 1-2 tablets thirty minutes prior to MRI.  May take one additional tablet before entering scanner, if needed.  MUST HAVE DRIVER. Patient not taking: Reported on 03/12/2016 12/05/15   Levert Feinstein, MD  oxyCODONE (ROXICODONE) 5 MG immediate release tablet Take 0.5 tablets (2.5 mg total) by mouth every 4 (four) hours as needed for severe pain. Patient not taking: Reported on 03/12/2016 03/08/16   Melene Plan, DO  venlafaxine XR (EFFEXOR XR) 37.5 MG 24 hr capsule Take 1 capsule (37.5 mg total) by mouth daily with breakfast. Patient not taking: Reported on 03/12/2016 11/25/15   Levert Feinstein, MD    Physical Exam:    Constitutional: Ill-appearing female who appears to be in moderate discomfort. Vitals:   03/12/16 2214 03/12/16 2217 03/12/16 2230 03/12/16 2300  BP: 108/59  117/64 124/67  Pulse:  93 88 90  Resp:      SpO2:  95% 91% 96%   Eyes: PERRL, lids and conjunctivae normal ENMT: Mucous membranes are moist. Posterior pharynx clear of any exudate or lesions.Normal dentition.  Neck: normal, supple, no masses, no thyromegaly Respiratory: clear to auscultation bilaterally, no wheezing, no crackles. Normal respiratory effort. No accessory muscle use.  Cardiovascular: Regular rate and rhythm, no  murmurs / rubs / gallops. No extremity edema. 2+ pedal pulses. No carotid bruits.  Abdomen: Right upper quadrant tenderness tenderness, no masses palpated. No hepatosplenomegaly. Bowel sounds positive.  Musculoskeletal: no clubbing / cyanosis. No joint deformity upper and lower extremities. Good ROM, no contractures. Normal muscle tone.  Skin: Poor skin turgor. No rashes, lesions, ulcers. No induration Neurologic: CN 2-12 grossly intact. Sensation intact, DTR normal. Strength 5/5 in all 4.  Psychiatric: Normal judgment and insight. Alert and oriented x 3. Normal mood.     Labs on Admission: I have personally reviewed following labs and imaging studies  CBC:  Recent Labs Lab 03/08/16 1005 03/12/16 1700  WBC 21.3* 12.9*  HGB 14.2 11.8*  HCT 40.6 35.2*  MCV 82.9 83.8  PLT 202 186   Basic Metabolic Panel:  Recent Labs Lab 03/08/16 1005 03/12/16 1700  NA 131* 129*  K 3.5 3.1*  CL 92* 91*  CO2 26 28  GLUCOSE 248* 227*  BUN 16 14  CREATININE 0.83 0.79  CALCIUM 9.3 8.6*   GFR: Estimated Creatinine Clearance: 59.8 mL/min (by C-G formula based on SCr of 0.79 mg/dL). Liver Function Tests:  Recent Labs Lab 03/08/16 1005  AST 21  ALT 27  ALKPHOS 78  BILITOT 1.3*  PROT 8.1  ALBUMIN 4.6    Recent Labs Lab 03/08/16 1005  LIPASE 11   No results for input(s): AMMONIA in the last 168 hours. Coagulation Profile: No results for input(s): INR, PROTIME in the last 168 hours. Cardiac Enzymes:  Recent Labs Lab 03/12/16 1950  TROPONINI <0.03   BNP (last 3 results) No results for input(s): PROBNP in the last 8760 hours. HbA1C: No results for input(s): HGBA1C in the last 72 hours. CBG: No results for input(s): GLUCAP in the last 168 hours. Lipid Profile: No results for input(s): CHOL, HDL, LDLCALC, TRIG, CHOLHDL, LDLDIRECT in the last 72 hours. Thyroid Function Tests: No results for input(s): TSH, T4TOTAL, FREET4, T3FREE, THYROIDAB in the last 72 hours. Anemia  Panel: No results for input(s): VITAMINB12, FOLATE, FERRITIN, TIBC, IRON, RETICCTPCT in the last 72 hours. Urine analysis:    Component Value Date/Time   COLORURINE AMBER (A) 03/12/2016 1621   APPEARANCEUR HAZY (A) 03/12/2016 1621   LABSPEC 1.020 03/12/2016 1621   PHURINE 6.0 03/12/2016 1621   GLUCOSEU 150 (A) 03/12/2016 1621   HGBUR SMALL (A) 03/12/2016 1621   BILIRUBINUR NEGATIVE 03/12/2016 1621   KETONESUR 5 (A) 03/12/2016 1621   PROTEINUR 30 (A) 03/12/2016 1621   NITRITE NEGATIVE 03/12/2016 1621   LEUKOCYTESUR SMALL (A) 03/12/2016 1621   Sepsis Labs: No results found for this or any previous visit (from the past 240 hour(s)).   Radiological Exams on Admission: Ct Abdomen Pelvis W Contrast  Result Date: 03/12/2016 CLINICAL DATA:  Right lower quadrant pain. Vomiting, decreased p.o. intake, right-sided back pain and nausea. EXAM: CT ABDOMEN AND PELVIS WITH CONTRAST TECHNIQUE: Multidetector CT imaging of the abdomen and pelvis was performed using the standard protocol following bolus administration of intravenous contrast. CONTRAST:  100mL ISOVUE-300 IOPAMIDOL (ISOVUE-300) INJECTION 61% COMPARISON:  None. FINDINGS: Lower chest: Right greater than left lower lobe atelectasis. Calcified right lower lobe granuloma. Trace right pleural thickening. Hepatobiliary: Gallbladder is distended containing calcified gallstone with marked pericholecystic inflammation. There is gallbladder wall enhancement and thickening, pericholecystic soft tissue stranding and small amount of fluid. No biliary dilatation or calcified choledocholithiasis. There is a 2.1 cm hypodense lesion in the dome of the right lobe of the liver with peripheral contrast puddling consistent with hemangioma. Pancreas: Focal calcification in the pancreatic tail. No ductal dilatation or inflammation. Spleen: Normal in size without focal abnormality. Adrenals/Urinary Tract: No adrenal nodule. No hydronephrosis or perinephric edema.  Homogeneous enhancement. Tiny cortical hypodensities in the left kidney, too small to characterize but likely cysts. Urinary bladder is distended without wall thickening. Stomach/Bowel: Para pyloric and duodenal wall thickening likely reactive related to adjacent gallbladder inflammation. There is a duodenum diverticulum, no para diverticular inflammation. No small bowel dilatation. No bowel obstruction. Normal appendix. Moderate stool in the right colon, with small volume of stool distally. Vascular/Lymphatic: Abdominal aortic atherosclerosis without aneurysm. Small retroperitoneal lymph nodes are likely reactive. Reproductive: Calcified uterine fibroid, uterus is heterogeneous. The left ovary is prominent size for age measuring at least 3.8 cm with probable small complex cyst. Other: No free air.  No intra- abdominal abscess. Musculoskeletal: Degenerative disc disease at L2-L3. There are no acute or  suspicious osseous abnormalities. IMPRESSION: 1. Acute cholecystitis.  No biliary dilatation. 2. Insula finding of left ovarian prominence for age post menopausal status during 3.8 cm with possible cystic component. Recommend nonemergent pelvic ultrasound after coalescence of acute illness. 3. Abdominal atherosclerosis, no aneurysm.  Uterine fibroids. Electronically Signed   By: Rubye OaksMelanie  Ehinger M.D.   On: 03/12/2016 22:27    EKG: Independently reviewed. Sinus tachycardia 110 bpm  Assessment/Plan Acute cholecystitis: Acute. Patient presents with nausea vomiting found to have acute cholecystitis on imaging. - Admit to MedSurg bed - Continue Rocephin per pharmacy - NPO  - Morphine IV prn pain - Appreciate general surgery consultative services, will follow-up for further recommendations  Leukocytosis WBC elevated at 12.9 on admission suspect secondary to above. - Repeat CBC in am  Possible UTI - Follow-up urine culture - Covered with antibiotics as seen above   Nausea and vomiting - Zofran  prn  Diabetes mellitus type 2 with hyperglycemia: Patient on oral agents at home. Initial CBG 227 on admission. - Hypoglycemic protocols - Held metformin - Levemir 5 units 1 dose now - CBGs every 6 hrs with insulin sliding scale insulin  Hypokalemia: Initial troponin is 3.1. - Potassium chloride 30 mEq IV 1 dose now - Continue to monitor and replace as needed  Left ovarian lesion: Incidentally noted 3.8 cm with possible cystic component on CT abdomen and pelvis.  - Follow-up with a pelvic ultrasound when able  History of CVA: Noted back in 06/2013 with without residual deficit - Hold Plavix and aspirin for pending surgery  Hyponatremia: Initial sodium 129 suspect this is a hypovolemic hyponatremia due to the nausea vomiting symptoms. - IV fluids and recheck BMP in a.m.   Anemia:  hemoglobin 11.8 on admission. Patient with vitals stable at this time and no reported bleeding. - Continue to monitor  DVT prophylaxis: SCDs Code Status:  Full Family Communication:  No family present at bedside Disposition Plan: Likely discharge home once medically stable Consults called: Surgery Admission status: Inpatient  [name] A Illona Bulman MD [facility] Pager [phone]  If 7PM-7AM, please contact night-coverage [email or web address] Password [email or web address]  03/12/2016, 11:26 PM

## 2016-03-12 NOTE — ED Notes (Signed)
Pt transported to CT ?

## 2016-03-12 NOTE — ED Triage Notes (Signed)
Pt sent from [facility] from ED follow up appt. Was in ED on 03/08/16 for nausea, emesis, chest pain. Given GI cocktail, IV fluids, discharged home at that time. Since then, pt has been unable to eat or drink much, unable to tolerate PO medicine, vomits after medicines. C/o weakness, knees buckling, confusion, difficulty thinking clearly, difficulty driving x several months, worsened about 1 week ago, continued right back pain and nausea. Had to [place] 4 times [place] due to not feeling safe. No diarrhea. No new symptoms today.

## 2016-03-12 NOTE — ED Notes (Signed)
ED Provider at bedside. 

## 2016-03-12 NOTE — ED Notes (Signed)
RN at bedside, drawing labs.

## 2016-03-12 NOTE — ED Notes (Signed)
Pt returned from CT °

## 2016-03-12 NOTE — ED Notes (Signed)
Surgeon at bedside.  

## 2016-03-13 ENCOUNTER — Encounter (HOSPITAL_COMMUNITY): Payer: Self-pay | Admitting: Radiology

## 2016-03-13 ENCOUNTER — Inpatient Hospital Stay (HOSPITAL_COMMUNITY): Payer: BC Managed Care – PPO

## 2016-03-13 DIAGNOSIS — N838 Other noninflammatory disorders of ovary, fallopian tube and broad ligament: Secondary | ICD-10-CM | POA: Diagnosis present

## 2016-03-13 DIAGNOSIS — D72829 Elevated white blood cell count, unspecified: Secondary | ICD-10-CM | POA: Diagnosis present

## 2016-03-13 DIAGNOSIS — K810 Acute cholecystitis: Secondary | ICD-10-CM | POA: Diagnosis present

## 2016-03-13 HISTORY — PX: IR GENERIC HISTORICAL: IMG1180011

## 2016-03-13 LAB — PROTIME-INR
INR: 1.1
Prothrombin Time: 14.3 seconds (ref 11.4–15.2)

## 2016-03-13 LAB — COMPREHENSIVE METABOLIC PANEL
ALBUMIN: 2.5 g/dL — AB (ref 3.5–5.0)
ALT: 33 U/L (ref 14–54)
AST: 19 U/L (ref 15–41)
Alkaline Phosphatase: 101 U/L (ref 38–126)
Anion gap: 6 (ref 5–15)
BUN: 9 mg/dL (ref 6–20)
CHLORIDE: 105 mmol/L (ref 101–111)
CO2: 27 mmol/L (ref 22–32)
CREATININE: 0.67 mg/dL (ref 0.44–1.00)
Calcium: 7.5 mg/dL — ABNORMAL LOW (ref 8.9–10.3)
GFR calc Af Amer: >60 mL/min (ref >60)
GFR calc non Af Amer: >60 mL/min (ref >60)
GLUCOSE: 171 mg/dL — AB (ref 65–99)
POTASSIUM: 3.2 mmol/L — AB (ref 3.5–5.1)
Sodium: 138 mmol/L (ref 135–145)
Total Bilirubin: 0.7 mg/dL (ref 0.3–1.2)
Total Protein: 5.6 g/dL — ABNORMAL LOW (ref 6.5–8.1)

## 2016-03-13 LAB — GLUCOSE, CAPILLARY
Glucose-Capillary: 124 mg/dL — ABNORMAL HIGH (ref 65–99)
Glucose-Capillary: 166 mg/dL — ABNORMAL HIGH (ref 65–99)
Glucose-Capillary: 255 mg/dL — ABNORMAL HIGH (ref 65–99)

## 2016-03-13 LAB — CBC
HEMATOCRIT: 30.3 % — AB (ref 36.0–46.0)
Hemoglobin: 10.2 g/dL — ABNORMAL LOW (ref 12.0–15.0)
MCH: 28.5 pg (ref 26.0–34.0)
MCHC: 33.7 g/dL (ref 30.0–36.0)
MCV: 84.6 fL (ref 78.0–100.0)
PLATELETS: 159 10*3/uL (ref 150–400)
RBC: 3.58 MIL/uL — ABNORMAL LOW (ref 3.87–5.11)
RDW: 12.6 % (ref 11.5–15.5)
WBC: 9.9 10*3/uL (ref 4.0–10.5)

## 2016-03-13 LAB — MAGNESIUM: Magnesium: 2 mg/dL (ref 1.7–2.4)

## 2016-03-13 MED ORDER — METRONIDAZOLE IN NACL 5-0.79 MG/ML-% IV SOLN
500.0000 mg | Freq: Three times a day (TID) | INTRAVENOUS | Status: DC
  Administered 2016-03-13 – 2016-03-15 (×6): 500 mg via INTRAVENOUS
  Filled 2016-03-13 (×6): qty 100

## 2016-03-13 MED ORDER — LIDOCAINE VISCOUS 2 % MT SOLN
OROMUCOSAL | Status: AC
  Filled 2016-03-13: qty 15

## 2016-03-13 MED ORDER — INSULIN DETEMIR 100 UNIT/ML ~~LOC~~ SOLN
5.0000 [IU] | Freq: Every day | SUBCUTANEOUS | Status: DC
  Administered 2016-03-13: 5 [IU] via SUBCUTANEOUS
  Filled 2016-03-13: qty 0.05

## 2016-03-13 MED ORDER — SODIUM CHLORIDE 0.9 % IV SOLN
30.0000 meq | Freq: Once | INTRAVENOUS | Status: AC
  Administered 2016-03-13: 30 meq via INTRAVENOUS
  Filled 2016-03-13: qty 15

## 2016-03-13 MED ORDER — ALBUTEROL SULFATE (2.5 MG/3ML) 0.083% IN NEBU: 2.5000 mg | INHALATION_SOLUTION | RESPIRATORY_TRACT | Status: DC | PRN

## 2016-03-13 MED ORDER — MIDAZOLAM HCL 2 MG/2ML IJ SOLN
INTRAMUSCULAR | Status: AC
  Filled 2016-03-13: qty 6

## 2016-03-13 MED ORDER — MIDAZOLAM HCL 2 MG/2ML IJ SOLN
INTRAMUSCULAR | Status: AC | PRN
  Administered 2016-03-13 (×2): 1 mg via INTRAVENOUS

## 2016-03-13 MED ORDER — POTASSIUM CHLORIDE CRYS ER 20 MEQ PO TBCR
20.0000 meq | EXTENDED_RELEASE_TABLET | Freq: Once | ORAL | Status: DC
  Filled 2016-03-13: qty 1

## 2016-03-13 MED ORDER — IOPAMIDOL (ISOVUE-300) INJECTION 61%
10.0000 mL | Freq: Once | INTRAVENOUS | Status: AC | PRN
  Administered 2016-03-13: 10 mL

## 2016-03-13 MED ORDER — FENTANYL CITRATE (PF) 100 MCG/2ML IJ SOLN
INTRAMUSCULAR | Status: AC
  Filled 2016-03-13: qty 4

## 2016-03-13 MED ORDER — DEXTROSE 5 % IV SOLN
2.0000 g | INTRAVENOUS | Status: DC
  Administered 2016-03-13 – 2016-03-14 (×2): 2 g via INTRAVENOUS
  Filled 2016-03-13 (×3): qty 2

## 2016-03-13 MED ORDER — IOPAMIDOL (ISOVUE-300) INJECTION 61%
INTRAVENOUS | Status: AC
  Administered 2016-03-13: 10 mL
  Filled 2016-03-13: qty 50

## 2016-03-13 MED ORDER — SODIUM CHLORIDE 0.9 % IV SOLN
INTRAVENOUS | Status: AC
  Administered 2016-03-13: 01:00:00 via INTRAVENOUS

## 2016-03-13 MED ORDER — INSULIN ASPART 100 UNIT/ML ~~LOC~~ SOLN
0.0000 [IU] | Freq: Four times a day (QID) | SUBCUTANEOUS | Status: DC
  Administered 2016-03-13: 1 [IU] via SUBCUTANEOUS
  Administered 2016-03-13: 5 [IU] via SUBCUTANEOUS
  Administered 2016-03-14: 2 [IU] via SUBCUTANEOUS
  Administered 2016-03-14 (×2): 1 [IU] via SUBCUTANEOUS
  Administered 2016-03-15 (×3): 2 [IU] via SUBCUTANEOUS
  Administered 2016-03-16: 1 [IU] via SUBCUTANEOUS
  Administered 2016-03-16: 0 [IU] via SUBCUTANEOUS
  Administered 2016-03-16: 2 [IU] via SUBCUTANEOUS

## 2016-03-13 MED ORDER — SODIUM CHLORIDE 0.9 % IV SOLN
INTRAVENOUS | Status: AC
  Administered 2016-03-13 – 2016-03-14 (×2): via INTRAVENOUS

## 2016-03-13 MED ORDER — SODIUM CHLORIDE 0.9 % IV SOLN
INTRAVENOUS | Status: DC
  Administered 2016-03-13: 13:00:00 via INTRAVENOUS

## 2016-03-13 MED ORDER — ENOXAPARIN SODIUM 40 MG/0.4ML ~~LOC~~ SOLN: 40.0000 mg | SUBCUTANEOUS | Status: DC

## 2016-03-13 MED ORDER — FENTANYL CITRATE (PF) 100 MCG/2ML IJ SOLN
INTRAMUSCULAR | Status: AC | PRN
  Administered 2016-03-13: 50 ug via INTRAVENOUS

## 2016-03-13 MED ORDER — MORPHINE SULFATE (PF) 2 MG/ML IV SOLN
2.0000 mg | INTRAVENOUS | Status: DC | PRN
  Administered 2016-03-13 (×2): 2 mg via INTRAVENOUS
  Administered 2016-03-13: 4 mg via INTRAVENOUS
  Administered 2016-03-13 – 2016-03-15 (×8): 2 mg via INTRAVENOUS
  Filled 2016-03-13 (×8): qty 1
  Filled 2016-03-13: qty 2
  Filled 2016-03-13 (×2): qty 1

## 2016-03-13 MED ORDER — LIDOCAINE HCL 1 % IJ SOLN
INTRAMUSCULAR | Status: AC | PRN
  Administered 2016-03-13: 5 mL

## 2016-03-13 NOTE — Progress Notes (Signed)
Pharmacy Antibiotic Note  Marie [name] is a 60 y.o. female admitted on 03/12/2016 with Acute cholecystitis.  Pharmacy has been consulted for Ceftriaxone dosing.  Plan: Ceftriaxone 2gm iv q24hr       Temp (24hrs), Avg:98.4 F (36.9 C), Min:98.4 F (36.9 C), Max:98.4 F (36.9 C)   Recent Labs Lab 03/08/16 1005 03/12/16 1700  WBC 21.3* 12.9*  CREATININE 0.83 0.79    Estimated Creatinine Clearance: 59.8 mL/min (by C-G formula based on SCr of 0.79 mg/dL).    Allergies  Allergen Reactions  . Penicillins Other (See Comments)    Driving couldn't see and head felt heavy  Has patient had a PCN reaction causing immediate rash, facial/tongue/throat swelling, SOB or lightheadedness with hypotension: No Has patient had a PCN reaction causing severe rash involving mucus membranes or skin necrosis: No Has patient had a PCN reaction that required hospitalization: No Has patient had a PCN reaction occurring within the last 10 years: No If all of the above answers are "NO", then may proceed with Cephalosporin use.   . Other Other (See Comments)    Allergic to beef and eggplant    Antimicrobials this admission: Ceftriaxone 12/7 >>  Dose adjustments this admission: -  Microbiology results: pending  Thank you for allowing pharmacy to be a part of this patient's care.  Aleene DavidsonGrimsley Jr, Jamilia Jacques Crowford 03/13/2016 2:13 AM

## 2016-03-13 NOTE — Progress Notes (Signed)
[facility] Progress Note     Subjective: Intermittent RUQ pain, but mostly c/o right sided back pain. Denies fever, chills, nausea, or vomiting.  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  Objective: Vital signs in last 24 hours: Temp:  [97.9 F (36.6 C)-98.4 F (36.9 C)] 97.9 F (36.6 C) (12/08 0523) Pulse Rate:  [82-110] 82 (12/08 0523) Resp:  [16-18] 18 (12/08 0523) BP: (105-137)/(52-85) 132/68 (12/08 0523) SpO2:  [91 %-100 %] 97 % (12/08 0523) Last BM Date: 03/09/16  Intake/Output from previous day: 12/07 0701 - 12/08 0700 In: 535.4 [I.V.:535.4] Out: -  Intake/Output this shift:  No intake/output data recorded.  PE: Gen:  Alert, NAD, pleasant Abd: Soft, mild TTP RUQ, ND, +BS Ext:  No erythema, edema, or tenderness   Lab Results:   Recent Labs  03/12/16 1700 03/13/16 0540  WBC 12.9* 9.9  HGB 11.8* 10.2*  HCT 35.2* 30.3*  PLT 186 159   BMET  Recent Labs  03/12/16 1700 03/13/16 0540  NA 129* 138  K 3.1* 3.2*  CL 91* 105  CO2 28 27  GLUCOSE 227* 171*  BUN 14 9  CREATININE 0.79 0.67  CALCIUM 8.6* 7.5*   PT/INR  Recent Labs  03/13/16 0834  LABPROT 14.3  INR 1.10   CMP     Component Value Date/Time   NA 138 03/13/2016 0540   K 3.2 (L) 03/13/2016 0540   CL 105 03/13/2016 0540   CO2 27 03/13/2016 0540   GLUCOSE 171 (H) 03/13/2016 0540   BUN 9 03/13/2016 0540   CREATININE 0.67 03/13/2016 0540   CALCIUM 7.5 (L) 03/13/2016 0540   PROT 5.6 (L) 03/13/2016 0540   PROT 6.7 05/14/2014 0945   ALBUMIN 2.5 (L) 03/13/2016 0540   AST 19 03/13/2016 0540   ALT 33 03/13/2016 0540   ALKPHOS 101 03/13/2016 0540   BILITOT 0.7 03/13/2016 0540   GFRNONAA >60 03/13/2016 0540   GFRAA >60 03/13/2016 0540   Lipase     Component Value Date/Time   LIPASE 11 03/08/2016 1005        Studies/Results: Ct Abdomen Pelvis W Contrast  Result Date: 03/12/2016 CLINICAL DATA:  Right lower quadrant pain. Vomiting, decreased p.o. intake, right-sided back pain and nausea. EXAM: CT ABDOMEN AND PELVIS WITH CONTRAST TECHNIQUE: Multidetector CT imaging of the abdomen and pelvis was performed using the standard protocol following bolus administration of intravenous contrast. CONTRAST:  100mL ISOVUE-300 IOPAMIDOL (ISOVUE-300) INJECTION 61% COMPARISON:  None. FINDINGS: Lower chest: Right greater than left lower lobe atelectasis. Calcified right lower lobe granuloma. Trace right pleural thickening. Hepatobiliary: Gallbladder is distended containing calcified gallstone with marked pericholecystic inflammation. There is gallbladder wall enhancement and thickening, pericholecystic soft tissue stranding and small amount of fluid. No biliary dilatation or calcified choledocholithiasis. There is a 2.1 cm hypodense lesion in the dome of the right lobe of the liver with peripheral contrast puddling consistent with hemangioma. Pancreas: Focal calcification in the pancreatic tail. No ductal dilatation or inflammation. Spleen: Normal in size without focal abnormality. Adrenals/Urinary Tract: No adrenal nodule. No hydronephrosis or perinephric edema. Homogeneous enhancement. Tiny cortical hypodensities in the left kidney, too small to characterize but likely cysts. Urinary bladder is distended without wall thickening. Stomach/Bowel: Para pyloric and duodenal wall thickening likely reactive related to adjacent gallbladder inflammation. There is a duodenum diverticulum, no  para diverticular inflammation. No small bowel dilatation. No bowel obstruction. Normal appendix. Moderate stool in the right colon, with small volume of stool distally. Vascular/Lymphatic: Abdominal aortic atherosclerosis without aneurysm. Small retroperitoneal lymph nodes are likely reactive. Reproductive: Calcified uterine fibroid, uterus is  heterogeneous. The left ovary is prominent size for age measuring at least 3.8 cm with probable small complex cyst. Other: No free air.  No intra- abdominal abscess. Musculoskeletal: Degenerative disc disease at L2-L3. There are no acute or suspicious osseous abnormalities. IMPRESSION: 1. Acute cholecystitis.  No biliary dilatation. 2. Insula finding of left ovarian prominence for age post menopausal status during 3.8 cm with possible cystic component. Recommend nonemergent pelvic ultrasound after coalescence of acute illness. 3. Abdominal atherosclerosis, no aneurysm.  Uterine fibroids. Electronically Signed   By: Rubye OaksMelanie  Ehinger M.D.   On: 03/12/2016 22:27    Anti-infectives: Anti-infectives    Start     Dose/Rate Route Frequency Ordered Stop   03/13/16 2200  cefTRIAXone (ROCEPHIN) 2 g in dextrose 5 % 50 mL IVPB     2 g 100 mL/hr over 30 Minutes Intravenous Every 24 hours 03/13/16 0143     03/12/16 2245  cefTRIAXone (ROCEPHIN) 2 g in dextrose 5 % 50 mL IVPB     2 g 100 mL/hr over 30 Minutes Intravenous  Once 03/12/16 2239 03/12/16 2352     Assessment/Plan Cholecystitis  NPO, IVF, IV abx  Percutaneous cholecystostomy tube by IR    Large ovarian prominence - outpatient follow up  PMH CVA - plavix held since 11/30, continue to hold for possible procedure DM with related neuropathy and retinopathy HTN    LOS: 1 day    Adam PhenixElizabeth S Caterin Tabares , [facility] 03/13/2016, 9:32 AM Pager: [phone] Consults: [phone] Mon-Fri 7:00 am-4:30 pm Sat-Sun 7:00 am-11:30 am

## 2016-03-13 NOTE — Progress Notes (Addendum)
.                                                                                   Patient Demographics:    Marie [name], is a 60 y.o. female, DOB - 1955/04/23, OZD:[id number]  Admit date - 03/12/2016   Admitting Physician [name] A Smith, MD  Outpatient Primary MD for the patient is [name],[name], MD  LOS - 1   Chief Complaint  Patient presents with  . Weakness  . Nausea      Brief Hx  60 y.o. female with medical history significant of HTN, HLD, DM type II, CVA; who presents with at least a 5 day history of abdominal pain, nausea, and vomiting. Previously Evaluated 4 days ago in the ED for same symptoms, but was discharged home with antiemetics and pain medication to follow-up with her PCP. In the Ed- CT imaging of the abdomen revealed acute cholecystitis. She was given 2 g of IV Rocephin due to penicillin allergy history. Dr. Ezzard StandingNewman of general surgery was consulted and recommended due to the patient's comorbid duties [facility] -   Subjective:    Marie RinksFarida Vantol today has no fevers, no emesis, abdominal pain has improved with opioids. She is not unhappy with the fact that she does not know whether surgery will be done that she wants to have something to drink.    Assessment  & Plan :    Principal Problem:   Acute cholecystitis Active Problems:   Diabetes with retinopathy (HCC)   History of CVA (cerebrovascular accident) (multiple, recurrent)   Left ovarian enlargement   Leukocytosis   Acute cholecystitis- Patient presents with nausea vomiting abdominal pain found to have acute cholecystitis on imaging. No fevers white counts trended down from 12.9-9.9. Started on IV Rocephin 12/7,  - Continue Rocephin 2 g daily,  - We will add metronidazole 500mg  3 times a day for anaerobic coverage, she has a penicillin allergy, otherwise Zosyn - Morphine IV prn pain - Appreciate general surgery,recommendations- recommendations for the percutaneous cholecystectomy tube by  IR -Appreciate IR evaluation, for percutaneous cholecystectomy drain Possible UTI - Follow-up urine culture - Covered with antibiotics as seen above - Zofran prn - IV fluids normal saline 125 mL/hour for 1 day - Talked to Ir can advance diet as tolerated.  Diabetes mellitus type 2 -home meds- metformin 1000 mg twice a day. Initial CBG 227 on admission. - Hypoglycemic protocols - Held metformin - CBGs every 6 hrs with insulin sliding scale insulin  - DC levemir N admission while nothing by mouth  Hypokalemia: Initial troponin is 3.1, repeat 3.2, after IV KCl. - Potassium chloride 30 mEq IV  - Bmet AM  Left ovarian lesion: Incidentally noted 3.8 cm with possible cystic component on CT abdomen and pelvis.  - Follow-up with a pelvic ultrasound when acute issues resolve  HTN, History of CVA: Noted back in 06/2013 with without residual deficit - Hold Plavix and aspirin for pending surgery - Norvasc weakness soft blood pressures  Hyponatremia: Initial sodium 129,  resolved now 138. Likely  hypovolemic hyponatremia due to the nausea vomiting, reduce by mouth intake  - IV fluids  Anemia:  hemoglobin 11.8 on admission. Patient with vitals stable at this time and no reported bleeding. - Continue to monitor  Code Status : Full  Disposition Plan  : Parcutaneous IR and surgery eval, percutaneous cholecystectomy drain  Consults  : Gen. Surgery, IR   DVT Prophylaxis  :  SCDs for now pending procedure.  Lab Results  Component Value Date   PLT 159 03/13/2016    Inpatient Medications  Scheduled Meds: . sodium chloride   Intravenous STAT  . cefTRIAXone (ROCEPHIN)  IV  2 g Intravenous Q24H  . enoxaparin (LOVENOX) injection  40 mg Subcutaneous Q24H  . insulin aspart  0-9 Units Subcutaneous Q6H  . insulin detemir  5 Units Subcutaneous QHS  . metronidazole  500 mg Intravenous Q8H  . potassium chloride  20 mEq Oral Once   Continuous Infusions: PRN Meds:.albuterol, morphine  injection    Anti-infectives    Start     Dose/Rate Route Frequency Ordered Stop   03/13/16 2200  cefTRIAXone (ROCEPHIN) 2 g in dextrose 5 % 50 mL IVPB     2 g 100 mL/hr over 30 Minutes Intravenous Every 24 hours 03/13/16 0143     03/13/16 1000  metroNIDAZOLE (FLAGYL) IVPB 500 mg     500 mg 100 mL/hr over 60 Minutes Intravenous Every 8 hours 03/13/16 0954     03/12/16 2245  cefTRIAXone (ROCEPHIN) 2 g in dextrose 5 % 50 mL IVPB     2 g 100 mL/hr over 30 Minutes Intravenous  Once 03/12/16 2239 03/12/16 2352        Objective:   Vitals:   03/12/16 2300 03/12/16 2330 03/13/16 0009 03/13/16 0523  BP: 124/67 137/79 (!) 109/52 132/68  Pulse: 90 96 85 82  Resp:   18 18  Temp:   98.4 F (36.9 C) 97.9 F (36.6 C)  TempSrc:   Oral Oral  SpO2: 96% 96% 95% 97%    Wt Readings from Last 3 Encounters:  03/08/16 56.2 kg (124 lb)  07/01/15 55.3 kg (122 lb)  01/23/15 56.2 kg (124 lb)     Intake/Output Summary (Last 24 hours) at 03/13/16 1137 Last data filed at 03/13/16 0500  Gross per 24 hour  Intake           535.42 ml  Output                0 ml  Net           535.42 ml     Physical Exam  Gen:- Awake Alert, Oriented. HEENT:- [place].AT, No sclera icterus Neck-Supple Neck Lungs-  CTAB, no wheezes no crackles. CV- S1, S2 normal, regular Abd-  +ve B.Sounds, Abd Soft,mild generalized tenderness tenderness,    Extremity/Skin:- No  edema,  warm and well perfused, moving all extremities    Data Review:   Micro Results No results found for this or any previous visit (from the past 240 hour(s)).  Radiology Reports Dg Chest 2 View  Result Date: 03/08/2016 CLINICAL DATA:  Vomiting for 2 days. Central chest pain. Hypertension. EXAM: CHEST  2 VIEW COMPARISON:  None. FINDINGS: The heart size and mediastinal contours are within normal limits. Both lungs are clear. The visualized skeletal structures are unremarkable. Loop recorder. IMPRESSION: No active cardiopulmonary disease.  Electronically Signed   By: Elsie StainJohn T Curnes M.D.   On: 03/08/2016 09:57   Ct Abdomen Pelvis W Contrast  Result Date: 03/12/2016 CLINICAL DATA:  Right lower quadrant pain. Vomiting, decreased p.o. intake, right-sided back  pain and nausea. EXAM: CT ABDOMEN AND PELVIS WITH CONTRAST TECHNIQUE: Multidetector CT imaging of the abdomen and pelvis was performed using the standard protocol following bolus administration of intravenous contrast. CONTRAST:  ISOVUE-300 IOPAMIDOL (ISOVUE-300) INJECTION 61% COMPARISON:  None. FINDINGS: Lower chest: Right greater than left lower lobe atelectasis. Calcified right lower lobe granuloma. Trace right pleural thickening. Hepatobiliary: Gallbladder is distended containing calcified gallstone with marked pericholecystic inflammation. There is gallbladder wall enhancement and thickening, pericholecystic soft tissue stranding and small amount of fluid. No biliary dilatation or calcified choledocholithiasis. There is a 2.1 cm hypodense lesion in the dome of the right lobe of the liver with peripheral contrast puddling consistent with hemangioma. Pancreas: Focal calcification in the pancreatic tail. No ductal dilatation or inflammation. Spleen: Normal in size without focal abnormality. Adrenals/Urinary Tract: No adrenal nodule. No hydronephrosis or perinephric edema. Homogeneous enhancement. Tiny cortical hypodensities in the left kidney, too small to characterize but likely cysts. Urinary bladder is distended without wall thickening. Stomach/Bowel: Para pyloric and duodenal wall thickening likely reactive related to adjacent gallbladder inflammation. There is a duodenum diverticulum, no para diverticular inflammation. No small bowel dilatation. No bowel obstruction. Normal appendix. Moderate stool in the right colon, with small volume of stool distally. Vascular/Lymphatic: Abdominal aortic atherosclerosis without aneurysm. Small retroperitoneal lymph nodes are likely reactive.  Reproductive: Calcified uterine fibroid, uterus is heterogeneous. The left ovary is prominent size for age measuring at least 3.8 cm with probable small complex cyst. Other: No free air.  No intra- abdominal abscess. Musculoskeletal: Degenerative disc disease at L2-L3. There are no acute or suspicious osseous abnormalities. IMPRESSION: 1. Acute cholecystitis.  No biliary dilatation. 2. Insula finding of left ovarian prominence for age post menopausal status during 3.8 cm with possible cystic component. Recommend nonemergent pelvic ultrasound after coalescence of acute illness. 3. Abdominal atherosclerosis, no aneurysm.  Uterine fibroids. Electronically Signed   By: Rubye Oaks M.D.   On: 03/12/2016 22:27     CBC  Recent Labs Lab 03/08/16 1005 03/12/16 1700 03/13/16 0540  WBC 21.3* 12.9* 9.9  HGB 14.2 11.8* 10.2*  HCT 40.6 35.2* 30.3*  PLT 202 186 159  MCV 82.9 83.8 84.6  MCH 29.0 28.1 28.5  MCHC 35.0 33.5 33.7  RDW 12.6 12.5 12.6    Chemistries   Recent Labs Lab 03/08/16 1005 03/12/16 1700 03/12/16 2247 03/13/16 0536 03/13/16 0540  NA 131* 129*  --   --  138  K 3.5 3.1*  --   --  3.2*  CL 92* 91*  --   --  105  CO2 26 28  --   --  27  GLUCOSE 248* 227*  --   --  171*  BUN 16 14  --   --  9  CREATININE 0.83 0.79  --   --  0.67  CALCIUM 9.3 8.6*  --   --  7.5*  MG  --   --   --  2.0  --   AST 21  --  20  --  19  ALT 27  --  36  --  33  ALKPHOS 78  --  103  --  101  BILITOT 1.3*  --  1.0  --  0.7   ------------------------------------------------------------------------------------------------------------------ No results for input(s): CHOL, HDL, LDLCALC, TRIG, CHOLHDL, LDLDIRECT in the last 72 hours.  Lab Results  Component Value Date   HGBA1C 8.1 (H) 05/14/2014   ------------------------------------------------------------------------------------------------------------------ No results for input(s): TSH, T4TOTAL, T3FREE, THYROIDAB in the last  72  hours.  Invalid input(s): FREET3 ------------------------------------------------------------------------------------------------------------------ No results for input(s): VITAMINB12, FOLATE, FERRITIN, TIBC, IRON, RETICCTPCT in the last 72 hours.  Coagulation profile  Recent Labs Lab 03/13/16 0834  INR 1.10    No results for input(s): DDIMER in the last 72 hours.  Cardiac Enzymes  Recent Labs Lab 03/12/16 1950  TROPONINI <0.03   ------------------------------------------------------------------------------------------------------------------ No results found for: BNP   Onnie Boer M.D on 03/13/2016 at 11:37 AM  Between 7am to 7pm - Pager - [phone]  After 7pm go to [email or web address] - password [email or web address]  [facility] -  Office  [phone]  Dragon dictation system was used to create this note, attempts have been made to correct errors, however presence of uncorrected errors is not a reflection quality of care provided

## 2016-03-13 NOTE — Procedures (Signed)
Technically successful US and Fluoro guided placed of a 10 Fr drainage catheter placement into the gallbladder for acute cholecystitis. Sample of purulent appearing bile sent to lab for analysis. Chole tube connected to gravity bag. EBL: Minimal No immediate post procedural complications.   Katherina RightJay Masyn Rostro, MD Pager #: [phone]

## 2016-03-13 NOTE — H&P (Signed)
Chief Complaint: Patient was seen in consultation today for acute cholecystitis at the request of Dr. Wenda Low  Referring Physician(s): Dr. Wenda Low  Supervising Physician: [name]  Patient Status: [facility] - In-pt  History of Present Illness: Marie [name] is a 60 y.o. female with about 5-7 days c/o RUQ pain, N/V She presented to ER and workup findings c/w acute cholecystitis. General surgery has requested placement of perc chole drain. Chart, imaging, allergies, albs, meds reviewed. She reports still having some pain, less nausea with IV meds and fluids. She does normally take Plavix for previous hx of CVA, but hasn't been able to take it for the past week due to N/V.   Past Medical History:  Diagnosis Date  . Diabetes (HCC)   . Hyperlipemia   . Hypertension   . Migraines     Past Surgical History:  Procedure Laterality Date  . CESAREAN SECTION    . LOOP RECORDER IMPLANT N/A 11/29/2013   Procedure: LOOP RECORDER IMPLANT;  Surgeon: Marinus Maw, MD;  Location: [facility] CATH LAB;  Service: Cardiovascular;  Laterality: N/A;  . TEE WITHOUT CARDIOVERSION N/A 08/24/2013   Procedure: TRANSESOPHAGEAL ECHOCARDIOGRAM (TEE);  Surgeon: [name], MD;  Location: [facility] ENDOSCOPY;  Service: Cardiovascular;  Laterality: N/A;    Allergies: Penicillins and Other  Medications:  Current Facility-Administered Medications:  .  0.9 %  sodium chloride infusion, , Intravenous, STAT, Jacalyn Lefevre, MD, Last Rate: 125 mL/hr at 03/13/16 0043 .  albuterol (PROVENTIL) (2.5 MG/3ML) 0.083% nebulizer solution 2.5 mg, 2.5 mg, Nebulization, Q2H PRN, Clydie Braun, MD .  cefTRIAXone (ROCEPHIN) 2 g in dextrose 5 % 50 mL IVPB, 2 g, Intravenous, Q24H, Rondell A Smith, MD .  enoxaparin (LOVENOX) injection 40 mg, 40 mg, Subcutaneous, Q24H, Rondell A Smith, MD .  insulin aspart (novoLOG) injection 0-9 Units, 0-9 Units, Subcutaneous, Q6H, Clydie Braun, MD, 5 Units at 03/13/16 0030 .  insulin detemir  (LEVEMIR) injection 5 Units, 5 Units, Subcutaneous, QHS, Clydie Braun, MD, 5 Units at 03/13/16 0044 .  metroNIDAZOLE (FLAGYL) IVPB 500 mg, 500 mg, Intravenous, Q8H, Ejiroghene E Emokpae, MD .  morphine 2 MG/ML injection 2-4 mg, 2-4 mg, Intravenous, Q3H PRN, Clydie Braun, MD, 4 mg at 03/13/16 1610 .  potassium chloride SA (K-DUR,KLOR-CON) CR tablet 20 mEq, 20 mEq, Oral, Once, Leda Gauze, NP    Family History  Problem Relation Age of Onset  . Stomach cancer Mother   . High blood pressure Mother   . Diabetes Maternal Grandfather   . High blood pressure Maternal Grandfather     Social History   Social History  . Marital status: Married    Spouse name: N/A  . Number of children: 3  . Years of education: MBA   Occupational History  .  [employer]   Social History Main Topics  . Smoking status: Never Smoker  . Smokeless tobacco: Never Used  . Alcohol use No     Comment: Social  . Drug use: No  . Sexual activity: Not Asked   Other Topics Concern  . None   Social History Narrative   Patient lives at home with [name].   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             Review of Systems: A 12 point ROS discussed  and pertinent positives are indicated in the HPI above.  All other systems are negative.  Review of Systems  Vital Signs: BP 132/68 (BP Location: Right Arm)   Pulse 82   Temp 97.9 F (36.6 C) (Oral)   Resp 18   SpO2 97%   Physical Exam  Constitutional: She is oriented to person, place, and time. She appears well-developed and well-nourished. No distress.  HENT:  Head: Normocephalic.  Mouth/Throat: Oropharynx is clear and moist.  Neck: Normal range of motion. No tracheal deviation present.  Cardiovascular: Normal rate, regular rhythm and normal heart sounds.   Pulmonary/Chest: Effort normal and breath sounds normal. No respiratory  distress.  Abdominal: Soft. There is tenderness. There is no guarding.  Neurological: She is alert and oriented to person, place, and time.  Skin: Skin is warm and dry.  Psychiatric: She has a normal mood and affect. Judgment normal.    Mallampati Score:  MD Evaluation Airway: WNL Heart: WNL Abdomen: WNL Chest/ Lungs: WNL ASA  Classification: 2 Mallampati/Airway Score: Two  Imaging: Dg Chest 2 View  Result Date: 03/08/2016 CLINICAL DATA:  Vomiting for 2 days. Central chest pain. Hypertension. EXAM: CHEST  2 VIEW COMPARISON:  None. FINDINGS: The heart size and mediastinal contours are within normal limits. Both lungs are clear. The visualized skeletal structures are unremarkable. Loop recorder. IMPRESSION: No active cardiopulmonary disease. Electronically Signed   By: Elsie StainJohn T Curnes M.D.   On: 03/08/2016 09:57   Ct Abdomen Pelvis W Contrast  Result Date: 03/12/2016 CLINICAL DATA:  Right lower quadrant pain. Vomiting, decreased p.o. intake, right-sided back pain and nausea. EXAM: CT ABDOMEN AND PELVIS WITH CONTRAST TECHNIQUE: Multidetector CT imaging of the abdomen and pelvis was performed using the standard protocol following bolus administration of intravenous contrast. CONTRAST:  100mL ISOVUE-300 IOPAMIDOL (ISOVUE-300) INJECTION 61% COMPARISON:  None. FINDINGS: Lower chest: Right greater than left lower lobe atelectasis. Calcified right lower lobe granuloma. Trace right pleural thickening. Hepatobiliary: Gallbladder is distended containing calcified gallstone with marked pericholecystic inflammation. There is gallbladder wall enhancement and thickening, pericholecystic soft tissue stranding and small amount of fluid. No biliary dilatation or calcified choledocholithiasis. There is a 2.1 cm hypodense lesion in the dome of the right lobe of the liver with peripheral contrast puddling consistent with hemangioma. Pancreas: Focal calcification in the pancreatic tail. No ductal dilatation or  inflammation. Spleen: Normal in size without focal abnormality. Adrenals/Urinary Tract: No adrenal nodule. No hydronephrosis or perinephric edema. Homogeneous enhancement. Tiny cortical hypodensities in the left kidney, too small to characterize but likely cysts. Urinary bladder is distended without wall thickening. Stomach/Bowel: Para pyloric and duodenal wall thickening likely reactive related to adjacent gallbladder inflammation. There is a duodenum diverticulum, no para diverticular inflammation. No small bowel dilatation. No bowel obstruction. Normal appendix. Moderate stool in the right colon, with small volume of stool distally. Vascular/Lymphatic: Abdominal aortic atherosclerosis without aneurysm. Small retroperitoneal lymph nodes are likely reactive. Reproductive: Calcified uterine fibroid, uterus is heterogeneous. The left ovary is prominent size for age measuring at least 3.8 cm with probable small complex cyst. Other: No free air.  No intra- abdominal abscess. Musculoskeletal: Degenerative disc disease at L2-L3. There are no acute or suspicious osseous abnormalities. IMPRESSION: 1. Acute cholecystitis.  No biliary dilatation. 2. Insula finding of left ovarian prominence for age post menopausal status during 3.8 cm with possible cystic component. Recommend nonemergent pelvic ultrasound after coalescence of acute illness. 3. Abdominal atherosclerosis, no aneurysm.  Uterine fibroids. Electronically Signed   By: Shawna OrleansMelanie  Ehinger M.D.   On: 03/12/2016 22:27    Labs:  CBC:  Recent Labs  03/08/16 1005 03/12/16 1700 03/13/16 0540  WBC 21.3* 12.9* 9.9  HGB 14.2 11.8* 10.2*  HCT 40.6 35.2* 30.3*  PLT 202 186 159    COAGS:  Recent Labs  03/13/16 0834  INR 1.10    BMP:  Recent Labs  03/08/16 1005 03/12/16 1700 03/13/16 0540  NA 131* 129* 138  K 3.5 3.1* 3.2*  CL 92* 91* 105  CO2 26 28 27   GLUCOSE 248* 227* 171*  BUN 16 14 9   CALCIUM 9.3 8.6* 7.5*  CREATININE 0.83 0.79 0.67   GFRNONAA >60 >60 >60  GFRAA >60 >60 >60    LIVER FUNCTION TESTS:  Recent Labs  03/08/16 1005 03/12/16 2247 03/13/16 0540  BILITOT 1.3* 1.0 0.7  AST 21 20 19   ALT 27 36 33  ALKPHOS 78 103 101  PROT 8.1 5.5* 5.6*  ALBUMIN 4.6 2.3* 2.5*    TUMOR MARKERS: No results for input(s): AFPTM, CEA, CA199, CHROMGRNA in the last 8760 hours.  Assessment and Plan: Acute cholecystitis For IR perc chole drain Labs reviewed, ok. Risks and Benefits discussed with the patient including, but not limited to bleeding, infection, gallbladder perforation, bile leak, sepsis or even death. All of the patient's questions were answered, patient is agreeable to proceed. Consent signed and in chart.    Thank you for this interesting consult.  I greatly enjoyed meeting Marie [name] and look forward to participating in their care.  A copy of this report was sent to the requesting provider on this date.  Electronically Signed: Brayton ElBRUNING, Saphire Barnhart 03/13/2016, 10:29 AM   I spent a total of 20 minutes in face to face in clinical consultation, greater than 50% of which was counseling/coordinating care for perc cholecystostomy drain

## 2016-03-13 NOTE — Progress Notes (Signed)
PHARMACY NOTE -  ceftriaxone  Pharmacy has been assisting with dosing of ceftriaxone for acute cholecystitis and suspected UTI. Dosage remains stable at 2gm IV q24h and need for further dosage adjustment appears unlikely at present.    Will sign off at this time.  Please reconsult if a change in clinical status warrants re-evaluation of dosage.  Dorna LeitzAnh Arti Trang, PharmD, BCPS 03/13/2016 8:09 AM

## 2016-03-14 DIAGNOSIS — D72829 Elevated white blood cell count, unspecified: Secondary | ICD-10-CM

## 2016-03-14 DIAGNOSIS — E11319 Type 2 diabetes mellitus with unspecified diabetic retinopathy without macular edema: Secondary | ICD-10-CM

## 2016-03-14 DIAGNOSIS — Z8673 Personal history of transient ischemic attack (TIA), and cerebral infarction without residual deficits: Secondary | ICD-10-CM

## 2016-03-14 DIAGNOSIS — K810 Acute cholecystitis: Secondary | ICD-10-CM

## 2016-03-14 DIAGNOSIS — N838 Other noninflammatory disorders of ovary, fallopian tube and broad ligament: Secondary | ICD-10-CM

## 2016-03-14 LAB — COMPREHENSIVE METABOLIC PANEL
ALT: 25 U/L (ref 14–54)
AST: 11 U/L — AB (ref 15–41)
Albumin: 2.4 g/dL — ABNORMAL LOW (ref 3.5–5.0)
Alkaline Phosphatase: 94 U/L (ref 38–126)
Anion gap: 6 (ref 5–15)
BUN: 8 mg/dL (ref 6–20)
CHLORIDE: 105 mmol/L (ref 101–111)
CO2: 27 mmol/L (ref 22–32)
Calcium: 7.5 mg/dL — ABNORMAL LOW (ref 8.9–10.3)
Creatinine, Ser: 0.67 mg/dL (ref 0.44–1.00)
Glucose, Bld: 152 mg/dL — ABNORMAL HIGH (ref 65–99)
POTASSIUM: 3.1 mmol/L — AB (ref 3.5–5.1)
SODIUM: 138 mmol/L (ref 135–145)
Total Bilirubin: 0.6 mg/dL (ref 0.3–1.2)
Total Protein: 5.2 g/dL — ABNORMAL LOW (ref 6.5–8.1)

## 2016-03-14 LAB — GLUCOSE, CAPILLARY
GLUCOSE-CAPILLARY: 123 mg/dL — AB (ref 65–99)
GLUCOSE-CAPILLARY: 129 mg/dL — AB (ref 65–99)
GLUCOSE-CAPILLARY: 164 mg/dL — AB (ref 65–99)
Glucose-Capillary: 104 mg/dL — ABNORMAL HIGH (ref 65–99)
Glucose-Capillary: 151 mg/dL — ABNORMAL HIGH (ref 65–99)

## 2016-03-14 LAB — CBC
HCT: 29.8 % — ABNORMAL LOW (ref 36.0–46.0)
Hemoglobin: 10 g/dL — ABNORMAL LOW (ref 12.0–15.0)
MCH: 28.7 pg (ref 26.0–34.0)
MCHC: 33.6 g/dL (ref 30.0–36.0)
MCV: 85.4 fL (ref 78.0–100.0)
PLATELETS: 165 10*3/uL (ref 150–400)
RBC: 3.49 MIL/uL — ABNORMAL LOW (ref 3.87–5.11)
RDW: 13 % (ref 11.5–15.5)
WBC: 5.4 10*3/uL (ref 4.0–10.5)

## 2016-03-14 LAB — MAGNESIUM: Magnesium: 2 mg/dL (ref 1.7–2.4)

## 2016-03-14 MED ORDER — ONDANSETRON HCL 4 MG/2ML IJ SOLN
4.0000 mg | Freq: Four times a day (QID) | INTRAMUSCULAR | Status: DC | PRN
  Administered 2016-03-14 – 2016-03-15 (×3): 4 mg via INTRAVENOUS
  Filled 2016-03-14 (×3): qty 2

## 2016-03-14 MED ORDER — POTASSIUM CHLORIDE IN NACL 40-0.9 MEQ/L-% IV SOLN
INTRAVENOUS | Status: AC
  Administered 2016-03-14: 75 mL/h via INTRAVENOUS
  Filled 2016-03-14: qty 1000

## 2016-03-14 MED ORDER — SODIUM CHLORIDE 0.9 % IV SOLN
30.0000 meq | Freq: Once | INTRAVENOUS | Status: AC
  Administered 2016-03-14: 30 meq via INTRAVENOUS
  Filled 2016-03-14: qty 15

## 2016-03-14 MED ORDER — ONDANSETRON HCL 4 MG/2ML IJ SOLN
INTRAMUSCULAR | Status: AC
  Administered 2016-03-14: 4 mg
  Filled 2016-03-14: qty 2

## 2016-03-14 MED ORDER — POTASSIUM CHLORIDE IN NACL 20-0.9 MEQ/L-% IV SOLN
INTRAVENOUS | Status: DC
  Administered 2016-03-15: 08:00:00 via INTRAVENOUS
  Filled 2016-03-14 (×2): qty 1000

## 2016-03-14 MED ORDER — POTASSIUM CHLORIDE IN NACL 40-0.9 MEQ/L-% IV SOLN: INTRAVENOUS | Status: DC

## 2016-03-14 NOTE — Progress Notes (Signed)
MEDICATION RELATED CONSULT NOTE   IR Procedure Consult - Anticoagulant/Antiplatelet PTA/Inpatient Med List Review by Pharmacist    Procedure: cholecystostomy tube placement    Completed: 12/8 at 3:15 pm  Post-Procedural bleeding risk per IR MD assessment:  standard  Antithrombotic medications on inpatient or PTA profile prior to procedure:   None, SCDs only    Recommended restart time per IR Post-Procedure Guidelines:  n/a   Other considerations:   none   Plan:     Continue SCDs  Marie [name], PharmD, BCPS Pager: [phone] 03/14/2016, 7:57 PM

## 2016-03-14 NOTE — Progress Notes (Signed)
.                                                                                   Patient Demographics:    Marie [name], is a 60 y.o. female, DOB - [birth year], UJW:[id number]  Admit date - 03/12/2016   Admitting Physician [name] A Smith, MD  Outpatient Primary MD for the patient is [name],[name], MD  LOS - 2  Chief Complaint  Patient presents with  . Weakness  . Nausea      Brief Hx  60 y.o. female with medical history significant of HTN, HLD, DM type II, CVA; who presents with at least a 5 day history of abdominal pain, nausea, and vomiting. Previously Evaluated 4 days ago in the ED for same symptoms, but was discharged home with antiemetics and pain medication to follow-up with her PCP. In the Ed- CT imaging of the abdomen revealed acute cholecystitis. She was given 2 g of IV Rocephin due to penicillin allergy history. Dr. Ezzard StandingNewman of general surgery was consulted and recommended due to the patient's comorbid duties [facility] -   Subjective:    Marie [name] having nausea and vomiting this morning.    Assessment  & Plan :    Principal Problem:   Acute cholecystitis Active Problems:   Diabetes with retinopathy (HCC)   History of CVA (cerebrovascular accident) (multiple, recurrent)   Left ovarian enlargement   Leukocytosis  Acute cholecystitis- Patient presents with nausea vomiting abdominal pain found to have acute cholecystitis on imaging. No fevers white counts trended down from 12.9-9.9. Started on IV Rocephin 12/7,  - Continue Rocephin 2 g daily,  - We will add metronidazole 500mg  3 times a day for anaerobic coverage, she has a penicillin allergy, otherwise Zosyn - Morphine IV prn pain - Appreciate general surgery,recommendations- recommendations for the percutaneous cholecystectomy tube by IR -Appreciate IR evaluation, s/p percutaneous cholecystectomy drain  Possible UTI - Follow-up urine culture - Covered with antibiotics as seen above - Zofran  prn - IV fluids normal saline 125 mL/hour for 1 day - advance diet as tolerated.  Diabetes mellitus type 2 -home meds- metformin 1000 mg twice a day. Initial CBG 227 on admission. - Hypoglycemic protocols - Held metformin - CBGs every 6 hrs with insulin sliding scale insulin  - DC levemir N admission while nothing by mouth CBG (last 3)   Recent Labs  03/13/16 1742 03/14/16 0009 03/14/16 0625  GLUCAP 124* 164* 129*   Hypokalemia: Initial troponin is 3.1, repeat 3.2, after IV KCl. - Potassium chloride 30 mEq IV  - Bmet AM - Mg OK, repeat KCL 30 meq    Left ovarian lesion: Incidentally noted 3.8 cm with possible cystic component on CT abdomen and pelvis.  - Follow-up with a pelvic ultrasound when acute issues resolve  HTN, History of CVA: Noted back in 06/2013 with without residual deficit - Hold Plavix and aspirin for pending surgery - Norvasc held for now  Hyponatremia: Initial sodium 129,  resolved now 138. Likely  hypovolemic hyponatremia due to the nausea vomiting, reduce by mouth intake  - IV fluids    Anemia:  hemoglobin 11.8 on admission.  Patient with vitals stable at this time and no reported bleeding. - Continue to monitor  Code Status : Full  Disposition Plan  : Parcutaneous IR and surgery eval, percutaneous cholecystectomy drain  Consults  : Gen. Surgery, IR   DVT Prophylaxis  :  SCDs for now pending procedure.  Lab Results  Component Value Date   PLT 165 03/14/2016    Inpatient Medications  Scheduled Meds: . ondansetron      . [COMPLETED] sodium chloride   Intravenous STAT  . cefTRIAXone (ROCEPHIN)  IV  2 g Intravenous Q24H  . insulin aspart  0-9 Units Subcutaneous Q6H  . metronidazole  500 mg Intravenous Q8H  . potassium chloride (KCL MULTIRUN) 30 mEq in 265 mL IVPB  30 mEq Intravenous Once   Continuous Infusions: PRN Meds:.albuterol, morphine injection, ondansetron (ZOFRAN) IV    Anti-infectives    Start     Dose/Rate Route Frequency  Ordered Stop   03/13/16 2200  cefTRIAXone (ROCEPHIN) 2 g in dextrose 5 % 50 mL IVPB     2 g 100 mL/hr over 30 Minutes Intravenous Every 24 hours 03/13/16 0143     03/13/16 1000  metroNIDAZOLE (FLAGYL) IVPB 500 mg     500 mg 100 mL/hr over 60 Minutes Intravenous Every 8 hours 03/13/16 0954     03/12/16 2245  cefTRIAXone (ROCEPHIN) 2 g in dextrose 5 % 50 mL IVPB     2 g 100 mL/hr over 30 Minutes Intravenous  Once 03/12/16 2239 03/12/16 2352        Objective:   Vitals:   03/13/16 1220 03/13/16 1225 03/13/16 2102 03/14/16 0452  BP: 118/68 128/71 (!) 116/55 130/60  Pulse: 81 83 80 61  Resp: 20 (!) 21 20 18   Temp:   99.8 F (37.7 C) 98.4 F (36.9 C)  TempSrc:   Oral Oral  SpO2: 97% 97% 96% 98%    Wt Readings from Last 3 Encounters:  03/08/16 56.2 kg (124 lb)  07/01/15 55.3 kg (122 lb)  01/23/15 56.2 kg (124 lb)     Intake/Output Summary (Last 24 hours) at 03/14/16 0831 Last data filed at 03/14/16 0700  Gross per 24 hour  Intake             2800 ml  Output              575 ml  Net             2225 ml     Physical Exam  Gen:- Awake Alert, Oriented. Appears ill. NAD.  HEENT:- [place].AT, No sclera icterus Neck-Supple Neck Lungs-  CTAB, no wheezes no crackles. CV- S1, S2 normal, regular Abd-  +ve B.Sounds, Abd Soft,mild generalized tenderness tenderness, biliary drain in place   Extremity/Skin:- No  edema,  warm and well perfused, moving all extremities    Data Review:   Micro Results Recent Results (from the past 240 hour(s))  Aerobic/Anaerobic Culture (surgical/deep wound)     Status: None (Preliminary result)   Collection Time: 03/13/16 12:48 PM  Result Value Ref Range Status   Specimen Description DRAINAGE GALLBLADER  Final   Special Requests Normal  Final   Gram Stain   Final    ABUNDANT WBC PRESENT, PREDOMINANTLY PMN FEW GRAM NEGATIVE RODS Performed at [facility]    Culture PENDING  Incomplete   Report Status PENDING  Incomplete    Radiology  Reports Dg Chest 2 View  Result Date: 03/08/2016 CLINICAL DATA:  Vomiting for 2 days.  Central chest pain. Hypertension. EXAM: CHEST  2 VIEW COMPARISON:  None. FINDINGS: The heart size and mediastinal contours are within normal limits. Both lungs are clear. The visualized skeletal structures are unremarkable. Loop recorder. IMPRESSION: No active cardiopulmonary disease. Electronically Signed   By: Elsie StainJohn T Curnes M.D.   On: 03/08/2016 09:57   Ct Abdomen Pelvis W Contrast  Result Date: 03/12/2016 CLINICAL DATA:  Right lower quadrant pain. Vomiting, decreased p.o. intake, right-sided back pain and nausea. EXAM: CT ABDOMEN AND PELVIS WITH CONTRAST TECHNIQUE: Multidetector CT imaging of the abdomen and pelvis was performed using the standard protocol following bolus administration of intravenous contrast. CONTRAST:  100mL ISOVUE-300 IOPAMIDOL (ISOVUE-300) INJECTION 61% COMPARISON:  None. FINDINGS: Lower chest: Right greater than left lower lobe atelectasis. Calcified right lower lobe granuloma. Trace right pleural thickening. Hepatobiliary: Gallbladder is distended containing calcified gallstone with marked pericholecystic inflammation. There is gallbladder wall enhancement and thickening, pericholecystic soft tissue stranding and small amount of fluid. No biliary dilatation or calcified choledocholithiasis. There is a 2.1 cm hypodense lesion in the dome of the right lobe of the liver with peripheral contrast puddling consistent with hemangioma. Pancreas: Focal calcification in the pancreatic tail. No ductal dilatation or inflammation. Spleen: Normal in size without focal abnormality. Adrenals/Urinary Tract: No adrenal nodule. No hydronephrosis or perinephric edema. Homogeneous enhancement. Tiny cortical hypodensities in the left kidney, too small to characterize but likely cysts. Urinary bladder is distended without wall thickening. Stomach/Bowel: Para pyloric and duodenal wall thickening likely reactive related to  adjacent gallbladder inflammation. There is a duodenum diverticulum, no para diverticular inflammation. No small bowel dilatation. No bowel obstruction. Normal appendix. Moderate stool in the right colon, with small volume of stool distally. Vascular/Lymphatic: Abdominal aortic atherosclerosis without aneurysm. Small retroperitoneal lymph nodes are likely reactive. Reproductive: Calcified uterine fibroid, uterus is heterogeneous. The left ovary is prominent size for age measuring at least 3.8 cm with probable small complex cyst. Other: No free air.  No intra- abdominal abscess. Musculoskeletal: Degenerative disc disease at L2-L3. There are no acute or suspicious osseous abnormalities. IMPRESSION: 1. Acute cholecystitis.  No biliary dilatation. 2. Insula finding of left ovarian prominence for age post menopausal status during 3.8 cm with possible cystic component. Recommend nonemergent pelvic ultrasound after coalescence of acute illness. 3. Abdominal atherosclerosis, no aneurysm.  Uterine fibroids. Electronically Signed   By: Rubye OaksMelanie  Ehinger M.D.   On: 03/12/2016 22:27   Ir Perc Cholecystostomy  Result Date: 03/13/2016 INDICATION: Acute cholecystitis EXAM: ULTRASOUND AND FLUOROSCOPIC-GUIDED CHOLECYSTOSTOMY TUBE PLACEMENT COMPARISON:  CT abdomen and pelvis - 03/12/2016 MEDICATIONS: The patient is currently admitted to the hospital and on intravenous antibiotics. Antibiotics were administered within an appropriate time frame prior to skin puncture. ANESTHESIA/SEDATION: Moderate (conscious) sedation was employed during this procedure. A total of Versed 2 mg and Fentanyl 50 mcg was administered intravenously. Moderate Sedation Time: 10 minutes. The patient's level of consciousness and vital signs were monitored continuously by radiology nursing throughout the procedure under my direct supervision. CONTRAST:  10mL ISOVUE-300 IOPAMIDOL (ISOVUE-300) INJECTION 61% - administered into the gallbladder fossa. FLUOROSCOPY  TIME:  36 seconds (7 mGy) COMPLICATIONS: None immediate. PROCEDURE: Informed written consent was obtained from the patient after a discussion of the risks, benefits and alternatives to treatment. Questions regarding the procedure were encouraged and answered. A timeout was performed prior to the initiation of the procedure. The right upper abdominal quadrant was prepped and draped in the usual sterile fashion, and a sterile drape was applied covering the operative field.  Maximum barrier sterile technique with sterile gowns and gloves were used for the procedure. A timeout was performed prior to the initiation of the procedure. Local anesthesia was provided with 1% lidocaine with epinephrine. Ultrasound scanning of the right upper quadrant demonstrates a markedly dilated gallbladder. Of note, the patient reported pain with ultrasound imaging over the gallbladder. Utilizing a transhepatic approach, a 22 gauge needle was advanced into the gallbladder under direct ultrasound guidance. An ultrasound image was saved for documentation purposes. Appropriate intraluminal puncture was confirmed with the efflux of bile and advancement of an 0.018 wire into the gallbladder lumen. The needle was exchanged for an Accustick set. A small amount of contrast was injected to confirm appropriate intraluminal positioning. Over a Benson wire, a 10.2-French Cook cholecystomy tube was advanced into the gallbladder fossa, coiled and locked. A small amount of purulent appearing bile was aspirated, capped and sent to the laboratory for analysis. A small amount of contrast was injected as several post procedural spot radiographic images were obtained in various obliquities. The catheter was secured to the skin with suture, connected to a drainage bag and a dressing was placed. The patient tolerated the procedure well without immediate post procedural complication. IMPRESSION: Successful ultrasound and fluoroscopic guided placement of a 10.2  French cholecystostomy tube. A small sample of purulent appearing bile was capped and sent to the laboratory for analysis. Electronically Signed   By: [name] M.D.   On: 03/13/2016 12:46     CBC  Recent Labs Lab 03/08/16 1005 03/12/16 1700 03/13/16 0540 03/14/16 0533  WBC 21.3* 12.9* 9.9 5.4  HGB 14.2 11.8* 10.2* 10.0*  HCT 40.6 35.2* 30.3* 29.8*  PLT 202 186 159 165  MCV 82.9 83.8 84.6 85.4  MCH 29.0 28.1 28.5 28.7  MCHC 35.0 33.5 33.7 33.6  RDW 12.6 12.5 12.6 13.0    Chemistries   Recent Labs Lab 03/08/16 1005 03/12/16 1700 03/12/16 2247 03/13/16 0536 03/13/16 0540 03/14/16 0533  NA 131* 129*  --   --  138 138  K 3.5 3.1*  --   --  3.2* 3.1*  CL 92* 91*  --   --  105 105  CO2 26 28  --   --  27 27  GLUCOSE 248* 227*  --   --  171* 152*  BUN 16 14  --   --  9 8  CREATININE 0.83 0.79  --   --  0.67 0.67  CALCIUM 9.3 8.6*  --   --  7.5* 7.5*  MG  --   --   --  2.0  --   --   AST 21  --  20  --  19 11*  ALT 27  --  36  --  33 25  ALKPHOS 78  --  103  --  101 94  BILITOT 1.3*  --  1.0  --  0.7 0.6   ------------------------------------------------------------------------------------------------------------------ No results for input(s): CHOL, HDL, LDLCALC, TRIG, CHOLHDL, LDLDIRECT in the last 72 hours.  Lab Results  Component Value Date   HGBA1C 8.1 (H) 05/14/2014   ------------------------------------------------------------------------------------------------------------------ No results for input(s): TSH, T4TOTAL, T3FREE, THYROIDAB in the last 72 hours.  Invalid input(s): FREET3 ------------------------------------------------------------------------------------------------------------------ No results for input(s): VITAMINB12, FOLATE, FERRITIN, TIBC, IRON, RETICCTPCT in the last 72 hours.  Coagulation profile  Recent Labs Lab 03/13/16 0834  INR 1.10    No results for input(s): DDIMER in the last 72 hours.  Cardiac Enzymes  Recent Labs Lab  03/12/16  1950  TROPONINI <0.03   ------------------------------------------------------------------------------------------------------------------ No results found for: BNP   Standley Dakins M.D on 03/14/2016 at 8:31 AM  Between 7am to 7pm - Pager - [phone]  After 7pm go to [email or web address] - password [email or web address]  [facility] -  Office  [phone]  Dragon dictation system was used to create this note, attempts have been made to correct errors, however presence of uncorrected errors is not a reflection quality of care provided

## 2016-03-14 NOTE — Progress Notes (Signed)
Referring Physician(s): Dr. Wenda LowMatt Martin  Supervising Physician: Simonne ComeWatts, John  [facility]  [facility]  Chief Complaint: Acute cholecystitis  Subjective: S/p perc chole drain 12/8 Feeling a little better, though had some N/V this am. Some pain in RUQ as expected. Son at bedside this am  Allergies: Penicillins and Other  Medications:  Current Facility-Administered Medications:  .  [COMPLETED] 0.9 %  sodium chloride infusion, , Intravenous, STAT, Ejiroghene E Emokpae, MD, Last Rate: 100 mL/hr at 03/14/16 0256 .  albuterol (PROVENTIL) (2.5 MG/3ML) 0.083% nebulizer solution 2.5 mg, 2.5 mg, Nebulization, Q2H PRN, [name] A Smith, MD .  cefTRIAXone (ROCEPHIN) 2 g in dextrose 5 % 50 mL IVPB, 2 g, Intravenous, Q24H, [name] A Smith, MD, 2 g at 03/13/16 2302 .  insulin aspart (novoLOG) injection 0-9 Units, 0-9 Units, Subcutaneous, Q6H, [name] A Smith, MD, 1 Units at 03/14/16 [phone] .  metroNIDAZOLE (FLAGYL) IVPB 500 mg, 500 mg, Intravenous, Q8H, Ejiroghene E Emokpae, MD, 500 mg at 03/14/16 0256 .  morphine 2 MG/ML injection 2-4 mg, 2-4 mg, Intravenous, Q3H PRN, [name] A Smith, MD, 2 mg at 03/14/16 0643 .  ondansetron (ZOFRAN) injection 4 mg, 4 mg, Intravenous, Q6H PRN, Clanford L Johnson, MD .  potassium chloride 30 mEq in sodium chloride 0.9 % 265 mL (KCL MULTIRUN) IVPB, 30 mEq, Intravenous, Once, Clanford Cyndie MullL Johnson, MD    Vital Signs: BP 130/60 (BP Location: Right Arm)   Pulse 61   Temp 98.4 F (36.9 C) (Oral)   Resp 18   SpO2 98%   Physical Exam Sitting up in chair. Lungs: CTA Heart: Reg Abd: soft, RUQ drain intact, site clean/dry. cloudy bilious output in bag   Imaging:  Ir Perc Cholecystostomy  Result Date: 03/13/2016 INDICATION: Acute cholecystitis EXAM: ULTRASOUND AND FLUOROSCOPIC-GUIDED CHOLECYSTOSTOMY TUBE PLACEMENT COMPARISON:  CT abdomen and pelvis - 03/12/2016 MEDICATIONS: The patient is currently admitted to the hospital and on intravenous antibiotics.  Antibiotics were administered within an appropriate time frame prior to skin puncture. ANESTHESIA/SEDATION: Moderate (conscious) sedation was employed during this procedure. A total of Versed 2 mg and Fentanyl 50 mcg was administered intravenously. Moderate Sedation Time: 10 minutes. The patient's level of consciousness and vital signs were monitored continuously by radiology nursing throughout the procedure under my direct supervision. CONTRAST:  10mL ISOVUE-300 IOPAMIDOL (ISOVUE-300) INJECTION 61% - administered into the gallbladder fossa. FLUOROSCOPY TIME:  36 seconds (7 mGy) COMPLICATIONS: None immediate. PROCEDURE: Informed written consent was obtained from the patient after a discussion of the risks, benefits and alternatives to treatment. Questions regarding the procedure were encouraged and answered. A timeout was performed prior to the initiation of the procedure. The right upper abdominal quadrant was prepped and draped in the usual sterile fashion, and a sterile drape was applied covering the operative field. Maximum barrier sterile technique with sterile gowns and gloves were used for the procedure. A timeout was performed prior to the initiation of the procedure. Local anesthesia was provided with 1% lidocaine with epinephrine. Ultrasound scanning of the right upper quadrant demonstrates a markedly dilated gallbladder. Of note, the patient reported pain with ultrasound imaging over the gallbladder. Utilizing a transhepatic approach, a 22 gauge needle was advanced into the gallbladder under direct ultrasound guidance. An ultrasound image was saved for documentation purposes. Appropriate intraluminal puncture was confirmed with the efflux of bile and advancement of an 0.018 wire into the gallbladder lumen. The needle was exchanged for an Accustick set. A small amount of contrast was injected to  confirm appropriate intraluminal positioning. Over a Benson wire, a 10.2-French Cook cholecystomy tube was  advanced into the gallbladder fossa, coiled and locked. A small amount of purulent appearing bile was aspirated, capped and sent to the laboratory for analysis. A small amount of contrast was injected as several post procedural spot radiographic images were obtained in various obliquities. The catheter was secured to the skin with suture, connected to a drainage bag and a dressing was placed. The patient tolerated the procedure well without immediate post procedural complication. IMPRESSION: Successful ultrasound and fluoroscopic guided placement of a 10.2 French cholecystostomy tube. A small sample of purulent appearing bile was capped and sent to the laboratory for analysis. Electronically Signed   By: Simonne ComeJohn  Watts M.D.   On: 03/13/2016 12:46    Labs:  CBC:  Recent Labs  03/08/16 1005 03/12/16 1700 03/13/16 0540 03/14/16 0533  WBC 21.3* 12.9* 9.9 5.4  HGB 14.2 11.8* 10.2* 10.0*  HCT 40.6 35.2* 30.3* 29.8*  PLT 202 186 159 165    COAGS:  Recent Labs  03/13/16 0834  INR 1.10    BMP:  Recent Labs  03/08/16 1005 03/12/16 1700 03/13/16 0540 03/14/16 0533  NA 131* 129* 138 138  K 3.5 3.1* 3.2* 3.1*  CL 92* 91* 105 105  CO2 26 28 27 27   GLUCOSE 248* 227* 171* 152*  BUN 16 14 9 8   CALCIUM 9.3 8.6* 7.5* 7.5*  CREATININE 0.83 0.79 0.67 0.67  GFRNONAA >60 >60 >60 >60  GFRAA >60 >60 >60 >60    LIVER FUNCTION TESTS:  Recent Labs  03/08/16 1005 03/12/16 2247 03/13/16 0540 03/14/16 0533  BILITOT 1.3* 1.0 0.7 0.6  AST 21 20 19  11*  ALT 27 36 33 25  ALKPHOS 78 103 101 94  PROT 8.1 5.5* 5.6* 5.2*  ALBUMIN 4.6 2.3* 2.5* 2.4*    Assessment and Plan: Acute cholecystitis S/p perc chole drain 12/8 Recommend keep drain minimal 6 weeks prior to consideration of removal unless has surgery before. Though with presence of cholelithiasis, would not recommend removal.  Electronically Signed: Brayton ElBRUNING, Layton Naves 03/14/2016, 8:57 AM   I spent a total of 15 Minutes at the the  patient's bedside AND on the patient's hospital floor or unit, greater than 50% of which was counseling/coordinating care for perc chole drain

## 2016-03-14 NOTE — Progress Notes (Signed)
Subjective: She still feels pretty bad this AM, she had nausea and vomited with broth this AM.  She feels better now. Sore over drain site.    Objective: Vital signs in last 24 hours: Temp:  [98.4 F (36.9 C)-99.8 F (37.7 C)] 98.4 F (36.9 C) (12/09 0452) Pulse Rate:  [61-83] 61 (12/09 0452) Resp:  [18-21] 18 (12/09 0452) BP: (116-139)/(55-73) 130/60 (12/09 0452) SpO2:  [96 %-100 %] 98 % (12/09 0452) Last BM Date: 03/09/16 Afebrile, VSS K+ 3.1  WBC sable    Intake/Output from previous day: 12/08 0701 - 12/09 0700 In: 2800 [I.V.:2700; IV Piggyback:100] Out: 575 [Urine:375; Drains:200] Intake/Output this shift: Total I/O In: -  Out: 1 [Emesis/NG output:1]  General appearance: alert, cooperative and no distress Resp: clear to auscultation bilaterally GI: soft, tender over drain site, + BS, no peritonitis  Lab Results:   Recent Labs  03/13/16 0540 03/14/16 0533  WBC 9.9 5.4  HGB 10.2* 10.0*  HCT 30.3* 29.8*  PLT 159 165    BMET  Recent Labs  03/13/16 0540 03/14/16 0533  NA 138 138  K 3.2* 3.1*  CL 105 105  CO2 27 27  GLUCOSE 171* 152*  BUN 9 8  CREATININE 0.67 0.67  CALCIUM 7.5* 7.5*   PT/INR  Recent Labs  03/13/16 0834  LABPROT 14.3  INR 1.10     Recent Labs Lab 03/08/16 1005 03/12/16 2247 03/13/16 0540 03/14/16 0533  AST 21 20 19  11*  ALT 27 36 33 25  ALKPHOS 78 103 101 94  BILITOT 1.3* 1.0 0.7 0.6  PROT 8.1 5.5* 5.6* 5.2*  ALBUMIN 4.6 2.3* 2.5* 2.4*     Lipase     Component Value Date/Time   LIPASE 11 03/08/2016 1005     Studies/Results: Ct Abdomen Pelvis W Contrast  Result Date: 03/12/2016 CLINICAL DATA:  Right lower quadrant pain. Vomiting, decreased p.o. intake, right-sided back pain and nausea. EXAM: CT ABDOMEN AND PELVIS WITH CONTRAST TECHNIQUE: Multidetector CT imaging of the abdomen and pelvis was performed using the standard protocol following bolus administration of intravenous contrast. CONTRAST:  100mL  ISOVUE-300 IOPAMIDOL (ISOVUE-300) INJECTION 61% COMPARISON:  None. FINDINGS: Lower chest: Right greater than left lower lobe atelectasis. Calcified right lower lobe granuloma. Trace right pleural thickening. Hepatobiliary: Gallbladder is distended containing calcified gallstone with marked pericholecystic inflammation. There is gallbladder wall enhancement and thickening, pericholecystic soft tissue stranding and small amount of fluid. No biliary dilatation or calcified choledocholithiasis. There is a 2.1 cm hypodense lesion in the dome of the right lobe of the liver with peripheral contrast puddling consistent with hemangioma. Pancreas: Focal calcification in the pancreatic tail. No ductal dilatation or inflammation. Spleen: Normal in size without focal abnormality. Adrenals/Urinary Tract: No adrenal nodule. No hydronephrosis or perinephric edema. Homogeneous enhancement. Tiny cortical hypodensities in the left kidney, too small to characterize but likely cysts. Urinary bladder is distended without wall thickening. Stomach/Bowel: Para pyloric and duodenal wall thickening likely reactive related to adjacent gallbladder inflammation. There is a duodenum diverticulum, no para diverticular inflammation. No small bowel dilatation. No bowel obstruction. Normal appendix. Moderate stool in the right colon, with small volume of stool distally. Vascular/Lymphatic: Abdominal aortic atherosclerosis without aneurysm. Small retroperitoneal lymph nodes are likely reactive. Reproductive: Calcified uterine fibroid, uterus is heterogeneous. The left ovary is prominent size for age measuring at least 3.8 cm with probable small complex cyst. Other: No free air.  No intra- abdominal abscess. Musculoskeletal: Degenerative disc disease at L2-L3. There are no acute  or suspicious osseous abnormalities. IMPRESSION: 1. Acute cholecystitis.  No biliary dilatation. 2. Insula finding of left ovarian prominence for age post menopausal status  during 3.8 cm with possible cystic component. Recommend nonemergent pelvic ultrasound after coalescence of acute illness. 3. Abdominal atherosclerosis, no aneurysm.  Uterine fibroids. Electronically Signed   By: Rubye OaksMelanie  Ehinger M.D.   On: 03/12/2016 22:27   Ir Perc Cholecystostomy  Result Date: 03/13/2016 INDICATION: Acute cholecystitis EXAM: ULTRASOUND AND FLUOROSCOPIC-GUIDED CHOLECYSTOSTOMY TUBE PLACEMENT COMPARISON:  CT abdomen and pelvis - 03/12/2016 MEDICATIONS: The patient is currently admitted to the hospital and on intravenous antibiotics. Antibiotics were administered within an appropriate time frame prior to skin puncture. ANESTHESIA/SEDATION: Moderate (conscious) sedation was employed during this procedure. A total of Versed 2 mg and Fentanyl 50 mcg was administered intravenously. Moderate Sedation Time: 10 minutes. The patient's level of consciousness and vital signs were monitored continuously by radiology nursing throughout the procedure under my direct supervision. CONTRAST:  10mL ISOVUE-300 IOPAMIDOL (ISOVUE-300) INJECTION 61% - administered into the gallbladder fossa. FLUOROSCOPY TIME:  36 seconds (7 mGy) COMPLICATIONS: None immediate. PROCEDURE: Informed written consent was obtained from the patient after a discussion of the risks, benefits and alternatives to treatment. Questions regarding the procedure were encouraged and answered. A timeout was performed prior to the initiation of the procedure. The right upper abdominal quadrant was prepped and draped in the usual sterile fashion, and a sterile drape was applied covering the operative field. Maximum barrier sterile technique with sterile gowns and gloves were used for the procedure. A timeout was performed prior to the initiation of the procedure. Local anesthesia was provided with 1% lidocaine with epinephrine. Ultrasound scanning of the right upper quadrant demonstrates a markedly dilated gallbladder. Of note, the patient reported pain  with ultrasound imaging over the gallbladder. Utilizing a transhepatic approach, a 22 gauge needle was advanced into the gallbladder under direct ultrasound guidance. An ultrasound image was saved for documentation purposes. Appropriate intraluminal puncture was confirmed with the efflux of bile and advancement of an 0.018 wire into the gallbladder lumen. The needle was exchanged for an Accustick set. A small amount of contrast was injected to confirm appropriate intraluminal positioning. Over a Benson wire, a 10.2-French Cook cholecystomy tube was advanced into the gallbladder fossa, coiled and locked. A small amount of purulent appearing bile was aspirated, capped and sent to the laboratory for analysis. A small amount of contrast was injected as several post procedural spot radiographic images were obtained in various obliquities. The catheter was secured to the skin with suture, connected to a drainage bag and a dressing was placed. The patient tolerated the procedure well without immediate post procedural complication. IMPRESSION: Successful ultrasound and fluoroscopic guided placement of a 10.2 French cholecystostomy tube. A small sample of purulent appearing bile was capped and sent to the laboratory for analysis. Electronically Signed   By: Simonne ComeJohn  Watts M.D.   On: 03/13/2016 12:46    Medications: . [COMPLETED] sodium chloride   Intravenous STAT  . cefTRIAXone (ROCEPHIN)  IV  2 g Intravenous Q24H  . insulin aspart  0-9 Units Subcutaneous Q6H  . metronidazole  500 mg Intravenous Q8H  . potassium chloride (KCL MULTIRUN) 30 mEq in 265 mL IVPB  30 mEq Intravenous Once     Assessment/Plan Cholecystitis Percutaneous cholecystostomy tube by IR, 03/13/16              Large left ovarian prominence - outpatient follow up  PMH CVA - plavix held since 11/30, continue  to hold for possible procedure DM with related neuropathy and retinopathy HTN  Anemia  FEN:  Diet: clear liquids ID: Rocephin day 3  Flagyl day 2   Plan:  I am going to leave her on clears for now, and put her back on some IV fluids till she is taking PO's well.  Draining fluid from IR drain well.     LOS: 2 days    Meiya Wisler 03/14/2016 [phone]

## 2016-03-15 LAB — BASIC METABOLIC PANEL
Anion gap: 8 (ref 5–15)
BUN: 8 mg/dL (ref 6–20)
CALCIUM: 7.6 mg/dL — AB (ref 8.9–10.3)
CO2: 26 mmol/L (ref 22–32)
CREATININE: 0.67 mg/dL (ref 0.44–1.00)
Chloride: 103 mmol/L (ref 101–111)
GFR calc non Af Amer: >60 mL/min (ref >60)
Glucose, Bld: 119 mg/dL — ABNORMAL HIGH (ref 65–99)
Potassium: 3.7 mmol/L (ref 3.5–5.1)
SODIUM: 137 mmol/L (ref 135–145)

## 2016-03-15 LAB — CBC WITH DIFFERENTIAL/PLATELET
BASOS ABS: 0 10*3/uL (ref 0.0–0.1)
Basophils Relative: 0 %
EOS ABS: 0.1 10*3/uL (ref 0.0–0.7)
Eosinophils Relative: 1 %
HCT: 29.7 % — ABNORMAL LOW (ref 36.0–46.0)
Hemoglobin: 10.1 g/dL — ABNORMAL LOW (ref 12.0–15.0)
LYMPHS ABS: 1.5 10*3/uL (ref 0.7–4.0)
Lymphocytes Relative: 20 %
MCH: 28.1 pg (ref 26.0–34.0)
MCHC: 34 g/dL (ref 30.0–36.0)
MCV: 82.5 fL (ref 78.0–100.0)
MONO ABS: 0.8 10*3/uL (ref 0.1–1.0)
Monocytes Relative: 11 %
Neutro Abs: 4.9 10*3/uL (ref 1.7–7.7)
Neutrophils Relative %: 68 %
PLATELETS: 205 10*3/uL (ref 150–400)
RBC: 3.6 MIL/uL — AB (ref 3.87–5.11)
RDW: 12.8 % (ref 11.5–15.5)
WBC: 7.3 10*3/uL (ref 4.0–10.5)

## 2016-03-15 LAB — MAGNESIUM: MAGNESIUM: 2 mg/dL (ref 1.7–2.4)

## 2016-03-15 LAB — GLUCOSE, CAPILLARY
Glucose-Capillary: 110 mg/dL — ABNORMAL HIGH (ref 65–99)
Glucose-Capillary: 161 mg/dL — ABNORMAL HIGH (ref 65–99)
Glucose-Capillary: 187 mg/dL — ABNORMAL HIGH (ref 65–99)

## 2016-03-15 MED ORDER — AMLODIPINE BESYLATE 5 MG PO TABS
5.0000 mg | ORAL_TABLET | Freq: Every day | ORAL | Status: DC
  Administered 2016-03-15 – 2016-03-17 (×3): 5 mg via ORAL
  Filled 2016-03-15 (×3): qty 1

## 2016-03-15 MED ORDER — LISINOPRIL 10 MG PO TABS
10.0000 mg | ORAL_TABLET | Freq: Every day | ORAL | Status: DC
  Administered 2016-03-15 – 2016-03-17 (×3): 10 mg via ORAL
  Filled 2016-03-15 (×3): qty 1

## 2016-03-15 MED ORDER — DOCUSATE SODIUM 100 MG PO CAPS
100.0000 mg | ORAL_CAPSULE | Freq: Two times a day (BID) | ORAL | Status: DC
  Administered 2016-03-15 – 2016-03-17 (×5): 100 mg via ORAL
  Filled 2016-03-15 (×5): qty 1

## 2016-03-15 MED ORDER — CLOPIDOGREL BISULFATE 75 MG PO TABS
75.0000 mg | ORAL_TABLET | Freq: Every day | ORAL | Status: DC
  Administered 2016-03-15 – 2016-03-17 (×3): 75 mg via ORAL
  Filled 2016-03-15 (×3): qty 1

## 2016-03-15 MED ORDER — SULFAMETHOXAZOLE-TRIMETHOPRIM 800-160 MG PO TABS
1.0000 | ORAL_TABLET | Freq: Two times a day (BID) | ORAL | Status: DC
  Administered 2016-03-15 – 2016-03-17 (×4): 1 via ORAL
  Filled 2016-03-15 (×6): qty 1

## 2016-03-15 MED ORDER — VENLAFAXINE HCL ER 37.5 MG PO CP24
37.5000 mg | ORAL_CAPSULE | Freq: Every day | ORAL | Status: DC
  Administered 2016-03-15 – 2016-03-17 (×3): 37.5 mg via ORAL
  Filled 2016-03-15 (×3): qty 1

## 2016-03-15 NOTE — Progress Notes (Signed)
.                                                                                   Patient Demographics:    Marie [name], is a 60 y.o. female, DOB - 16-Sep-1955, ZOX:[id number]  Admit date - 03/12/2016   Admitting Physician Clydie Braun, MD  Outpatient Primary MD for the patient is Gaye Alken, MD  LOS - 3  Chief Complaint  Patient presents with  . Weakness  . Nausea      Brief Hx  60 y.o. female with medical history significant of HTN, HLD, DM type II, CVA; who presents with at least a 5 day history of abdominal pain, nausea, and vomiting. Previously Evaluated 4 days ago in the ED for same symptoms, but was discharged home with antiemetics and pain medication to follow-up with her PCP. In the Ed- CT imaging of the abdomen revealed acute cholecystitis. She was given 2 g of IV Rocephin due to penicillin allergy history. Dr. Ezzard Standing of general surgery was consulted and recommended due to the patient's comorbid duties to admit to [facility]. -   Subjective:    Marie [name] having nausea and vomiting this morning.    Assessment  & Plan :    Principal Problem:   Acute cholecystitis Active Problems:   Diabetes with retinopathy (HCC)   History of CVA (cerebrovascular accident) (multiple, recurrent)   Left ovarian enlargement   Leukocytosis  Acute cholecystitis- Patient presents with nausea vomiting abdominal pain found to have acute cholecystitis on imaging. No fevers white counts trended down from 12.9-9.9. Started on IV Rocephin 12/7  - DC Rocephin as the E coli from gallbladder fluid culture resistant, Bactrim DS will be ordered.   - We will add metronidazole 500mg  3 times a day for anaerobic coverage, she has a penicillin allergy - Morphine IV prn pain - Appreciate general surgery,recommendations- recommendations for the percutaneous cholecystectomy tube by IR -Appreciate IR evaluation, s/p percutaneous cholecystectomy drain -Plan to follow up outpatient Dr. Daphine Deutscher in 6  weeks for cholecystectomy  Possible UTI - Follow-up urine culture - Covered with antibiotics as seen above - Zofran prn - advance diet as tolerated.  Diabetes mellitus type 2 -home meds- metformin 1000 mg twice a day. Initial CBG 227 on admission. - Hypoglycemic protocols - Held metformin - CBGs every 6 hrs with insulin sliding scale insulin   CBG (last 3)   Recent Labs  03/14/16 1817 03/14/16 2340 03/15/16 0635  GLUCAP 104* 151* 110*   Hypokalemia: Initial troponin is 3.1, repeat 3.2, after IV KCl. - Potassium chloride 30 mEq IV  - Bmet AM - Mg OK, repeat KCL 30 meq    Left ovarian lesion: Incidentally noted 3.8 cm with possible cystic component on CT abdomen and pelvis.  - Follow-up with a pelvic ultrasound when acute issues resolve  HTN, History of CVA: Noted back in 06/2013 with without residual deficit - restart plavix 12/10 (ok with surgery) - Norvasc restarted 12/10  Hyponatremia: Initial sodium 129,  resolved now 138. Likely  hypovolemic hyponatremia due to the nausea vomiting, reduce by mouth intake   Anemia:  hemoglobin 11.8 on admission. Patient with vitals  stable at this time and no reported bleeding. - Continue to monitor  Code Status : Full  Disposition Plan  : Parcutaneous IR and surgery eval, percutaneous cholecystectomy drain  Consults  : Gen. Surgery, IR   DVT Prophylaxis  :  SCDs for now pending procedure.  Lab Results  Component Value Date   PLT 205 03/15/2016    Inpatient Medications  Scheduled Meds: . amLODipine  5 mg Oral Daily  . cefTRIAXone (ROCEPHIN)  IV  2 g Intravenous Q24H  . insulin aspart  0-9 Units Subcutaneous Q6H  . lisinopril  10 mg Oral Daily  . metronidazole  500 mg Intravenous Q8H  . [START ON 03/16/2016] venlafaxine XR  37.5 mg Oral Q breakfast   Continuous Infusions: . 0.9 % NaCl with KCl 20 mEq / L 50 mL/hr at 03/15/16 0816   PRN Meds:.albuterol, morphine injection, ondansetron (ZOFRAN)  IV    Anti-infectives    Start     Dose/Rate Route Frequency Ordered Stop   03/13/16 2200  cefTRIAXone (ROCEPHIN) 2 g in dextrose 5 % 50 mL IVPB     2 g 100 mL/hr over 30 Minutes Intravenous Every 24 hours 03/13/16 0143     03/13/16 1000  metroNIDAZOLE (FLAGYL) IVPB 500 mg     500 mg 100 mL/hr over 60 Minutes Intravenous Every 8 hours 03/13/16 0954     03/12/16 2245  cefTRIAXone (ROCEPHIN) 2 g in dextrose 5 % 50 mL IVPB     2 g 100 mL/hr over 30 Minutes Intravenous  Once 03/12/16 2239 03/12/16 2352        Objective:   Vitals:   03/14/16 0452 03/14/16 1354 03/14/16 2046 03/15/16 0521  BP: 130/60 (!) 127/56 (!) 143/62 (!) 150/61  Pulse: 61 88 63 62  Resp: 18 18 18 18   Temp: 98.4 F (36.9 C) 97.8 F (36.6 C) 98.3 F (36.8 C) 98.2 F (36.8 C)  TempSrc: Oral Oral Oral Oral  SpO2: 98% 99% 97% 97%    Wt Readings from Last 3 Encounters:  03/08/16 56.2 kg (124 lb)  07/01/15 55.3 kg (122 lb)  01/23/15 56.2 kg (124 lb)     Intake/Output Summary (Last 24 hours) at 03/15/16 1025 Last data filed at 03/15/16 0900  Gross per 24 hour  Intake             1105 ml  Output              350 ml  Net              755 ml   Physical Exam  Gen:- Awake Alert, Oriented. Appears ill. NAD.  HEENT:- [place].AT, No sclera icterus Neck-Supple Neck Lungs-  CTAB, no wheezes no crackles. CV- S1, S2 normal, regular Abd-  +ve B.Sounds, Abd Soft,mild generalized tenderness tenderness, biliary drain in place   Extremity/Skin:- No  edema,  warm and well perfused, moving all extremities    Data Review:   Micro Results Recent Results (from the past 240 hour(s))  Aerobic/Anaerobic Culture (surgical/deep wound)     Status: None (Preliminary result)   Collection Time: 03/13/16 12:48 PM  Result Value Ref Range Status   Specimen Description DRAINAGE GALLBLADER  Final   Special Requests Normal  Final   Gram Stain   Final    ABUNDANT WBC PRESENT, PREDOMINANTLY PMN FEW GRAM NEGATIVE RODS    Culture    Final    MODERATE ESCHERICHIA COLI SUSCEPTIBILITIES TO FOLLOW [facility]  Report Status PENDING  Incomplete    Radiology Reports Dg Chest 2 View  Result Date: 03/08/2016 CLINICAL DATA:  Vomiting for 2 days. Central chest pain. Hypertension. EXAM: CHEST  2 VIEW COMPARISON:  None. FINDINGS: The heart size and mediastinal contours are within normal limits. Both lungs are clear. The visualized skeletal structures are unremarkable. Loop recorder. IMPRESSION: No active cardiopulmonary disease. Electronically Signed   By: Elsie Stain M.D.   On: 03/08/2016 09:57   Ct Abdomen Pelvis W Contrast  Result Date: 03/12/2016 CLINICAL DATA:  Right lower quadrant pain. Vomiting, decreased p.o. intake, right-sided back pain and nausea. EXAM: CT ABDOMEN AND PELVIS WITH CONTRAST TECHNIQUE: Multidetector CT imaging of the abdomen and pelvis was performed using the standard protocol following bolus administration of intravenous contrast. CONTRAST:  ISOVUE-300 IOPAMIDOL (ISOVUE-300) INJECTION 61% COMPARISON:  None. FINDINGS: Lower chest: Right greater than left lower lobe atelectasis. Calcified right lower lobe granuloma. Trace right pleural thickening. Hepatobiliary: Gallbladder is distended containing calcified gallstone with marked pericholecystic inflammation. There is gallbladder wall enhancement and thickening, pericholecystic soft tissue stranding and small amount of fluid. No biliary dilatation or calcified choledocholithiasis. There is a 2.1 cm hypodense lesion in the dome of the right lobe of the liver with peripheral contrast puddling consistent with hemangioma. Pancreas: Focal calcification in the pancreatic tail. No ductal dilatation or inflammation. Spleen: Normal in size without focal abnormality. Adrenals/Urinary Tract: No adrenal nodule. No hydronephrosis or perinephric edema. Homogeneous enhancement. Tiny cortical hypodensities in the left kidney, too small to characterize  but likely cysts. Urinary bladder is distended without wall thickening. Stomach/Bowel: Para pyloric and duodenal wall thickening likely reactive related to adjacent gallbladder inflammation. There is a duodenum diverticulum, no para diverticular inflammation. No small bowel dilatation. No bowel obstruction. Normal appendix. Moderate stool in the right colon, with small volume of stool distally. Vascular/Lymphatic: Abdominal aortic atherosclerosis without aneurysm. Small retroperitoneal lymph nodes are likely reactive. Reproductive: Calcified uterine fibroid, uterus is heterogeneous. The left ovary is prominent size for age measuring at least 3.8 cm with probable small complex cyst. Other: No free air.  No intra- abdominal abscess. Musculoskeletal: Degenerative disc disease at L2-L3. There are no acute or suspicious osseous abnormalities. IMPRESSION: 1. Acute cholecystitis.  No biliary dilatation. 2. Insula finding of left ovarian prominence for age post menopausal status during 3.8 cm with possible cystic component. Recommend nonemergent pelvic ultrasound after coalescence of acute illness. 3. Abdominal atherosclerosis, no aneurysm.  Uterine fibroids. Electronically Signed   By: Rubye Oaks M.D.   On: 03/12/2016 22:27   Ir Perc Cholecystostomy  Result Date: 03/13/2016 INDICATION: Acute cholecystitis EXAM: ULTRASOUND AND FLUOROSCOPIC-GUIDED CHOLECYSTOSTOMY TUBE PLACEMENT COMPARISON:  CT abdomen and pelvis - 03/12/2016 MEDICATIONS: The patient is currently admitted to the hospital and on intravenous antibiotics. Antibiotics were administered within an appropriate time frame prior to skin puncture. ANESTHESIA/SEDATION: Moderate (conscious) sedation was employed during this procedure. A total of Versed 2 mg and Fentanyl 50 mcg was administered intravenously. Moderate Sedation Time: 10 minutes. The patient's level of consciousness and vital signs were monitored continuously by radiology nursing throughout the  procedure under my direct supervision. CONTRAST:  10mL ISOVUE-300 IOPAMIDOL (ISOVUE-300) INJECTION 61% - administered into the gallbladder fossa. FLUOROSCOPY TIME:  36 seconds (7 mGy) COMPLICATIONS: None immediate. PROCEDURE: Informed written consent was obtained from the patient after a discussion of the risks, benefits and alternatives to treatment. Questions regarding the procedure were encouraged and answered. A timeout was performed prior to the initiation of the  procedure. The right upper abdominal quadrant was prepped and draped in the usual sterile fashion, and a sterile drape was applied covering the operative field. Maximum barrier sterile technique with sterile gowns and gloves were used for the procedure. A timeout was performed prior to the initiation of the procedure. Local anesthesia was provided with 1% lidocaine with epinephrine. Ultrasound scanning of the right upper quadrant demonstrates a markedly dilated gallbladder. Of note, the patient reported pain with ultrasound imaging over the gallbladder. Utilizing a transhepatic approach, a 22 gauge needle was advanced into the gallbladder under direct ultrasound guidance. An ultrasound image was saved for documentation purposes. Appropriate intraluminal puncture was confirmed with the efflux of bile and advancement of an 0.018 wire into the gallbladder lumen. The needle was exchanged for an Accustick set. A small amount of contrast was injected to confirm appropriate intraluminal positioning. Over a Benson wire, a 10.2-French Cook cholecystomy tube was advanced into the gallbladder fossa, coiled and locked. A small amount of purulent appearing bile was aspirated, capped and sent to the laboratory for analysis. A small amount of contrast was injected as several post procedural spot radiographic images were obtained in various obliquities. The catheter was secured to the skin with suture, connected to a drainage bag and a dressing was placed. The patient  tolerated the procedure well without immediate post procedural complication. IMPRESSION: Successful ultrasound and fluoroscopic guided placement of a 10.2 French cholecystostomy tube. A small sample of purulent appearing bile was capped and sent to the laboratory for analysis. Electronically Signed   By: [name] M.D.   On: 03/13/2016 12:46     CBC  Recent Labs Lab 03/12/16 1700 03/13/16 0540 03/14/16 0533 03/15/16 0545  WBC 12.9* 9.9 5.4 7.3  HGB 11.8* 10.2* 10.0* 10.1*  HCT 35.2* 30.3* 29.8* 29.7*  PLT 186 159 165 205  MCV 83.8 84.6 85.4 82.5  MCH 28.1 28.5 28.7 28.1  MCHC 33.5 33.7 33.6 34.0  RDW 12.5 12.6 13.0 12.8  LYMPHSABS  --   --   --  1.5  MONOABS  --   --   --  0.8  EOSABS  --   --   --  0.1  BASOSABS  --   --   --  0.0   Chemistries   Recent Labs Lab 03/12/16 1700 03/12/16 2247 03/13/16 0536 03/13/16 0540 03/14/16 0533 03/15/16 0545  NA 129*  --   --  138 138 137  K 3.1*  --   --  3.2* 3.1* 3.7  CL 91*  --   --  105 105 103  CO2 28  --   --  27 27 26   GLUCOSE 227*  --   --  171* 152* 119*  BUN 14  --   --  9 8 8   CREATININE 0.79  --   --  0.67 0.67 0.67  CALCIUM 8.6*  --   --  7.5* 7.5* 7.6*  MG  --   --  2.0  --  2.0 2.0  AST  --  20  --  19 11*  --   ALT  --  36  --  33 25  --   ALKPHOS  --  103  --  101 94  --   BILITOT  --  1.0  --  0.7 0.6  --    ------------------------------------------------------------------------------------------------------------------ No results for input(s): CHOL, HDL, LDLCALC, TRIG, CHOLHDL, LDLDIRECT in the last 72 hours.  Lab Results  Component Value Date  HGBA1C 8.1 (H) 05/14/2014   ------------------------------------------------------------------------------------------------------------------ No results for input(s): TSH, T4TOTAL, T3FREE, THYROIDAB in the last 72 hours.  Invalid input(s):  FREET3 ------------------------------------------------------------------------------------------------------------------ No results for input(s): VITAMINB12, FOLATE, FERRITIN, TIBC, IRON, RETICCTPCT in the last 72 hours.  Coagulation profile  Recent Labs Lab 03/13/16 0834  INR 1.10    No results for input(s): DDIMER in the last 72 hours.  Cardiac Enzymes  Recent Labs Lab 03/12/16 1950  TROPONINI <0.03   ------------------------------------------------------------------------------------------------------------------ No results found for: BNP   Marie [name] M.D on 03/15/2016 at 10:25 AM  Between 7am to 7pm - Pager - [phone]  After 7pm go to [email or web address] - password [email or web address]  [facility] -  Office  [phone]  Dragon dictation system was used to create this note, attempts have been made to correct errors, however presence of uncorrected errors is not a reflection quality of care provided

## 2016-03-15 NOTE — Progress Notes (Signed)
Subjective: She is having less nausea.      Objective: Vital signs in last 24 hours: Temp:  [97.8 F (36.6 C)-98.3 F (36.8 C)] 98.2 F (36.8 C) (12/10 0521) Pulse Rate:  [62-88] 62 (12/10 0521) Resp:  [18] 18 (12/10 0521) BP: (127-150)/(56-62) 150/61 (12/10 0521) SpO2:  [97 %-99 %] 97 % (12/10 0521) Last BM Date: 03/14/16   Intake/Output from previous day: 12/09 0701 - 12/10 0700 In: 1405 [P.O.:630; I.V.:725; IV Piggyback:50] Out: 351 [Emesis/NG output:1; Drains:350] Intake/Output this shift: Total I/O In: 60 [P.O.:60] Out: -   General appearance: alert, cooperative and no distress Resp: clear to auscultation bilaterally GI: soft, tender over drain site, no peritonitis  Lab Results:   Recent Labs  03/14/16 0533 03/15/16 0545  WBC 5.4 7.3  HGB 10.0* 10.1*  HCT 29.8* 29.7*  PLT 165 205    BMET  Recent Labs  03/14/16 0533 03/15/16 0545  NA 138 137  K 3.1* 3.7  CL 105 103  CO2 27 26  GLUCOSE 152* 119*  BUN 8 8  CREATININE 0.67 0.67  CALCIUM 7.5* 7.6*   PT/INR  Recent Labs  03/13/16 0834  LABPROT 14.3  INR 1.10     Recent Labs Lab 03/12/16 2247 03/13/16 0540 03/14/16 0533  AST 20 19 11*  ALT 36 33 25  ALKPHOS 103 101 94  BILITOT 1.0 0.7 0.6  PROT 5.5* 5.6* 5.2*  ALBUMIN 2.3* 2.5* 2.4*     Lipase     Component Value Date/Time   LIPASE 11 03/08/2016 1005     Studies/Results: Ir Perc Cholecystostomy  Result Date: 03/13/2016 INDICATION: Acute cholecystitis EXAM: ULTRASOUND AND FLUOROSCOPIC-GUIDED CHOLECYSTOSTOMY TUBE PLACEMENT COMPARISON:  CT abdomen and pelvis - 03/12/2016 MEDICATIONS: The patient is currently admitted to the hospital and on intravenous antibiotics. Antibiotics were administered within an appropriate time frame prior to skin puncture. ANESTHESIA/SEDATION: Moderate (conscious) sedation was employed during this procedure. A total of Versed 2 mg and Fentanyl 50 mcg was administered intravenously. Moderate Sedation  Time: 10 minutes. The patient's level of consciousness and vital signs were monitored continuously by radiology nursing throughout the procedure under my direct supervision. CONTRAST:  10mL ISOVUE-300 IOPAMIDOL (ISOVUE-300) INJECTION 61% - administered into the gallbladder fossa. FLUOROSCOPY TIME:  36 seconds (7 mGy) COMPLICATIONS: None immediate. PROCEDURE: Informed written consent was obtained from the patient after a discussion of the risks, benefits and alternatives to treatment. Questions regarding the procedure were encouraged and answered. A timeout was performed prior to the initiation of the procedure. The right upper abdominal quadrant was prepped and draped in the usual sterile fashion, and a sterile drape was applied covering the operative field. Maximum barrier sterile technique with sterile gowns and gloves were used for the procedure. A timeout was performed prior to the initiation of the procedure. Local anesthesia was provided with 1% lidocaine with epinephrine. Ultrasound scanning of the right upper quadrant demonstrates a markedly dilated gallbladder. Of note, the patient reported pain with ultrasound imaging over the gallbladder. Utilizing a transhepatic approach, a 22 gauge needle was advanced into the gallbladder under direct ultrasound guidance. An ultrasound image was saved for documentation purposes. Appropriate intraluminal puncture was confirmed with the efflux of bile and advancement of an 0.018 wire into the gallbladder lumen. The needle was exchanged for an Accustick set. A small amount of contrast was injected to confirm appropriate intraluminal positioning. Over a Benson wire, a 10.2-French Cook cholecystomy tube was advanced into the gallbladder fossa, coiled and locked. A small  amount of purulent appearing bile was aspirated, capped and sent to the laboratory for analysis. A small amount of contrast was injected as several post procedural spot radiographic images were obtained in  various obliquities. The catheter was secured to the skin with suture, connected to a drainage bag and a dressing was placed. The patient tolerated the procedure well without immediate post procedural complication. IMPRESSION: Successful ultrasound and fluoroscopic guided placement of a 10.2 French cholecystostomy tube. A small sample of purulent appearing bile was capped and sent to the laboratory for analysis. Electronically Signed   By: Simonne ComeJohn  Watts M.D.   On: 03/13/2016 12:46    Medications: . amLODipine  5 mg Oral Daily  . cefTRIAXone (ROCEPHIN)  IV  2 g Intravenous Q24H  . insulin aspart  0-9 Units Subcutaneous Q6H  . lisinopril  10 mg Oral Daily  . metronidazole  500 mg Intravenous Q8H  . venlafaxine XR  37.5 mg Oral Q breakfast   . 0.9 % NaCl with KCl 20 mEq / L 50 mL/hr at 03/15/16 0816   Assessment/Plan Cholecystitis Percutaneous cholecystostomy tube by IR, 03/13/16.  Will need f/u with Dr Daphine DeutscherMartin for interval cholecystectomy               Large left ovarian prominence - outpatient follow up  PMH CVA - plavix held since 11/30, ok to restart DM with related neuropathy and retinopathy HTN  Anemia  FEN:  Diet: low fat diet as tolerated ID: Rocephin day 3 Flagyl day 2   Plan:  Will advance to low fat diet and see how she does.  Can go home with tolerating a diet and pain controlled.       LOS: 3 days    Zonie Crutcher C. 03/15/2016

## 2016-03-16 ENCOUNTER — Encounter (HOSPITAL_COMMUNITY): Payer: Self-pay

## 2016-03-16 LAB — GLUCOSE, CAPILLARY
GLUCOSE-CAPILLARY: 196 mg/dL — AB (ref 65–99)
Glucose-Capillary: 161 mg/dL — ABNORMAL HIGH (ref 65–99)
Glucose-Capillary: 146 mg/dL — ABNORMAL HIGH (ref 65–99)

## 2016-03-16 LAB — BASIC METABOLIC PANEL
Anion gap: 6 (ref 5–15)
BUN: 8 mg/dL (ref 6–20)
CHLORIDE: 102 mmol/L (ref 101–111)
CO2: 25 mmol/L (ref 22–32)
Calcium: 7.7 mg/dL — ABNORMAL LOW (ref 8.9–10.3)
Creatinine, Ser: 0.53 mg/dL (ref 0.44–1.00)
GFR calc non Af Amer: >60 mL/min (ref >60)
Glucose, Bld: 162 mg/dL — ABNORMAL HIGH (ref 65–99)
POTASSIUM: 3.6 mmol/L (ref 3.5–5.1)
SODIUM: 133 mmol/L — AB (ref 135–145)

## 2016-03-16 MED ORDER — MORPHINE SULFATE (PF) 2 MG/ML IV SOLN
1.0000 mg | INTRAVENOUS | Status: DC | PRN
  Administered 2016-03-16: 1 mg via INTRAVENOUS
  Filled 2016-03-16: qty 1

## 2016-03-16 MED ORDER — BISACODYL 10 MG RE SUPP
10.0000 mg | Freq: Once | RECTAL | Status: AC
  Administered 2016-03-16: 10 mg via RECTAL
  Filled 2016-03-16: qty 1

## 2016-03-16 MED ORDER — FLEET ENEMA 7-19 GM/118ML RE ENEM
1.0000 | ENEMA | Freq: Once | RECTAL | Status: AC
Start: 2016-03-16 — End: 2016-03-17
  Administered 2016-03-17: 1 via RECTAL
  Filled 2016-03-16: qty 1

## 2016-03-16 NOTE — Progress Notes (Signed)
Referring Physician(s): Martin,M  Supervising Physician: Ruel FavorsShick, Trevor  [facility]  [facility]  Chief Complaint:  cholecystitis  Subjective: Pt doing ok today; has less abd pain; vomited after last meal   Allergies: Penicillins and Other  Medications: Prior to Admission medications   Medication Sig Start Date End Date Taking? Authorizing Provider  amLODipine (NORVASC) 5 MG tablet Take 5 mg by mouth daily 12/20/15  Yes Historical Provider, MD  aspirin EC 81 MG tablet Take 1 tablet (81 mg total) by mouth daily. 06/07/13  Yes Levert FeinsteinYijun Yan, MD  atorvastatin (LIPITOR) 20 MG tablet Take 1 tablet (20 mg total) by mouth daily. Patient taking differently: Take 20 mg by mouth 2 (two) times daily.  01/23/15  Yes Levert FeinsteinYijun Yan, MD  cholecalciferol (VITAMIN D) 1000 UNITS tablet Take 1,000 Units by mouth 2 (two) times daily.    Yes Historical Provider, MD  clopidogrel (PLAVIX) 75 MG tablet Take 1 tablet (75 mg total) by mouth daily. 07/10/15  Yes Levert FeinsteinYijun Yan, MD  Coenzyme Q10 (CO Q 10) 100 MG CAPS Take 200 mg by mouth daily.   Yes Historical Provider, MD  Ginkgo Biloba 500 MG CAPS Take 500 mg by mouth daily.    Yes Historical Provider, MD  lisinopril (PRINIVIL,ZESTRIL) 10 MG tablet Take 1 tablet (10 mg total) by mouth daily. 01/23/15  Yes Levert FeinsteinYijun Yan, MD  metFORMIN (GLUCOPHAGE) 500 MG tablet Take 1,000 mg by mouth 2 (two) times daily.  01/15/15  Yes Historical Provider, MD  Multiple Vitamins-Minerals (CENTRUM) tablet Take 1 tablet by mouth daily.   Yes Historical Provider, MD  Omega-3 Fatty Acids (FISH OIL) 1000 MG CAPS Take 1,000 mg by mouth daily.    Yes Historical Provider, MD  ondansetron (ZOFRAN ODT) 4 MG disintegrating tablet 4mg  ODT q4 hours prn nausea/vomit Patient taking differently: Take 4 mg by mouth every 4 (four) hours as needed for nausea or vomiting. 4mg  ODT q4 hours prn nausea/vomit 03/08/16  Yes Melene Planan Floyd, DO  ALPRAZolam Prudy Feeler(XANAX) 0.5 MG tablet Take 1-2 tablets thirty minutes prior to  MRI.  May take one additional tablet before entering scanner, if needed.  MUST HAVE DRIVER. Patient not taking: Reported on 03/12/2016 12/05/15   Levert FeinsteinYijun Yan, MD  oxyCODONE (ROXICODONE) 5 MG immediate release tablet Take 0.5 tablets (2.5 mg total) by mouth every 4 (four) hours as needed for severe pain. Patient not taking: Reported on 03/12/2016 03/08/16   Melene Planan Floyd, DO  venlafaxine XR (EFFEXOR XR) 37.5 MG 24 hr capsule Take 1 capsule (37.5 mg total) by mouth daily with breakfast. Patient not taking: Reported on 03/12/2016 11/25/15   Levert FeinsteinYijun Yan, MD     Vital Signs: BP (!) 147/73 (BP Location: Right Arm)   Pulse 69   Temp 98.9 F (37.2 C) (Oral)   Resp 18   SpO2 98%   Physical Exam GB drain intact, insertion site ok, mildly tender; output 200 cc green bile; cx's- e coli  Imaging: Ct Abdomen Pelvis W Contrast  Result Date: 03/12/2016 CLINICAL DATA:  Right lower quadrant pain. Vomiting, decreased p.o. intake, right-sided back pain and nausea. EXAM: CT ABDOMEN AND PELVIS WITH CONTRAST TECHNIQUE: Multidetector CT imaging of the abdomen and pelvis was performed using the standard protocol following bolus administration of intravenous contrast. CONTRAST:  100mL ISOVUE-300 IOPAMIDOL (ISOVUE-300) INJECTION 61% COMPARISON:  None. FINDINGS: Lower chest: Right greater than left lower lobe atelectasis. Calcified right lower lobe granuloma. Trace right pleural thickening. Hepatobiliary: Gallbladder is distended containing calcified gallstone with  marked pericholecystic inflammation. There is gallbladder wall enhancement and thickening, pericholecystic soft tissue stranding and small amount of fluid. No biliary dilatation or calcified choledocholithiasis. There is a 2.1 cm hypodense lesion in the dome of the right lobe of the liver with peripheral contrast puddling consistent with hemangioma. Pancreas: Focal calcification in the pancreatic tail. No ductal dilatation or inflammation. Spleen: Normal in size without  focal abnormality. Adrenals/Urinary Tract: No adrenal nodule. No hydronephrosis or perinephric edema. Homogeneous enhancement. Tiny cortical hypodensities in the left kidney, too small to characterize but likely cysts. Urinary bladder is distended without wall thickening. Stomach/Bowel: Para pyloric and duodenal wall thickening likely reactive related to adjacent gallbladder inflammation. There is a duodenum diverticulum, no para diverticular inflammation. No small bowel dilatation. No bowel obstruction. Normal appendix. Moderate stool in the right colon, with small volume of stool distally. Vascular/Lymphatic: Abdominal aortic atherosclerosis without aneurysm. Small retroperitoneal lymph nodes are likely reactive. Reproductive: Calcified uterine fibroid, uterus is heterogeneous. The left ovary is prominent size for age measuring at least 3.8 cm with probable small complex cyst. Other: No free air.  No intra- abdominal abscess. Musculoskeletal: Degenerative disc disease at L2-L3. There are no acute or suspicious osseous abnormalities. IMPRESSION: 1. Acute cholecystitis.  No biliary dilatation. 2. Insula finding of left ovarian prominence for age post menopausal status during 3.8 cm with possible cystic component. Recommend nonemergent pelvic ultrasound after coalescence of acute illness. 3. Abdominal atherosclerosis, no aneurysm.  Uterine fibroids. Electronically Signed   By: Rubye Oaks M.D.   On: 03/12/2016 22:27   Ir Perc Cholecystostomy  Result Date: 03/13/2016 INDICATION: Acute cholecystitis EXAM: ULTRASOUND AND FLUOROSCOPIC-GUIDED CHOLECYSTOSTOMY TUBE PLACEMENT COMPARISON:  CT abdomen and pelvis - 03/12/2016 MEDICATIONS: The patient is currently admitted to the [name] and on intravenous antibiotics. Antibiotics were administered within an appropriate time frame prior to skin puncture. ANESTHESIA/SEDATION: Moderate (conscious) sedation was employed during this procedure. A total of Versed 2 mg and  Fentanyl 50 mcg was administered intravenously. Moderate Sedation Time: 10 minutes. The patient's level of consciousness and vital signs were monitored continuously by radiology nursing throughout the procedure under my direct supervision. CONTRAST:  10mL ISOVUE-300 IOPAMIDOL (ISOVUE-300) INJECTION 61% - administered into the gallbladder fossa. FLUOROSCOPY TIME:  36 seconds (7 mGy) COMPLICATIONS: None immediate. PROCEDURE: Informed written consent was obtained from the patient after a discussion of the risks, benefits and alternatives to treatment. Questions regarding the procedure were encouraged and answered. A timeout was performed prior to the initiation of the procedure. The right upper abdominal quadrant was prepped and draped in the usual sterile fashion, and a sterile drape was applied covering the operative field. Maximum barrier sterile technique with sterile gowns and gloves were used for the procedure. A timeout was performed prior to the initiation of the procedure. Local anesthesia was provided with 1% lidocaine with epinephrine. Ultrasound scanning of the right upper quadrant demonstrates a markedly dilated gallbladder. Of note, the patient reported pain with ultrasound imaging over the gallbladder. Utilizing a transhepatic approach, a 22 gauge needle was advanced into the gallbladder under direct ultrasound guidance. An ultrasound image was saved for documentation purposes. Appropriate intraluminal puncture was confirmed with the efflux of bile and advancement of an 0.018 wire into the gallbladder lumen. The needle was exchanged for an Accustick set. A small amount of contrast was injected to confirm appropriate intraluminal positioning. Over a Benson wire, a 10.2-French Cook cholecystomy tube was advanced into the gallbladder fossa, coiled and locked. A small amount of purulent appearing  bile was aspirated, capped and sent to the laboratory for analysis. A small amount of contrast was injected as  several post procedural spot radiographic images were obtained in various obliquities. The catheter was secured to the skin with suture, connected to a drainage bag and a dressing was placed. The patient tolerated the procedure well without immediate post procedural complication. IMPRESSION: Successful ultrasound and fluoroscopic guided placement of a 10.2 French cholecystostomy tube. A small sample of purulent appearing bile was capped and sent to the laboratory for analysis. Electronically Signed   By: Simonne ComeJohn  Watts M.D.   On: 03/13/2016 12:46    Labs:  CBC:  Recent Labs  03/12/16 1700 03/13/16 0540 03/14/16 0533 03/15/16 0545  WBC 12.9* 9.9 5.4 7.3  HGB 11.8* 10.2* 10.0* 10.1*  HCT 35.2* 30.3* 29.8* 29.7*  PLT 186 159 165 205    COAGS:  Recent Labs  03/13/16 0834  INR 1.10    BMP:  Recent Labs  03/13/16 0540 03/14/16 0533 03/15/16 0545 03/16/16 0532  NA 138 138 137 133*  K 3.2* 3.1* 3.7 3.6  CL 105 105 103 102  CO2 27 27 26 25   GLUCOSE 171* 152* 119* 162*  BUN 9 8 8 8   CALCIUM 7.5* 7.5* 7.6* 7.7*  CREATININE 0.67 0.67 0.67 0.53  GFRNONAA >60 >60 >60 >60  GFRAA >60 >60 >60 >60    LIVER FUNCTION TESTS:  Recent Labs  03/08/16 1005 03/12/16 2247 03/13/16 0540 03/14/16 0533  BILITOT 1.3* 1.0 0.7 0.6  AST 21 20 19  11*  ALT 27 36 33 25  ALKPHOS 78 103 101 94  PROT 8.1 5.5* 5.6* 5.2*  ALBUMIN 4.6 2.3* 2.5* 2.4*    Assessment and Plan: Cholecystitis, s/p perc cholecystostomy 12/8; AF; last WBC nl; bile cx's- e coli- antbx per primary service; cont drain irrigation; additional plans as per [facility]   Electronically Signed: D. Jeananne RamaKevin Cassady Turano 03/16/2016, 9:59 AM   I spent a total of 15 minutes at the the patient's bedside AND on the patient's [name] floor or unit, greater than 50% of which was counseling/coordinating care for percutaneous cholecystostomy    Patient ID: [name], female   DOB: 08-04-1955, 60 y.o.   MRN: [id number]

## 2016-03-16 NOTE — Evaluation (Signed)
Physical Therapy Evaluation Patient Details Name: Marie [name] MRN: [id number] DOB: 07/13/1955 Today's Date: 03/16/2016   History of Present Illness  [name] is a 60 y.o. female with medical history significant of HTN, HLD, DM type II, CVA; who presents with at least a 5 day history of abdominal pain, nausea, and vomiting. Patient describes a sharp epigastric to right quadrant abdominal pain   Clinical Impression  Pt admitted with above diagnosis. Pt currently with functional limitations due to the deficits listed below (see PT Problem List). * Pt will benefit from skilled PT to increase their independence and safety with mobility to allow discharge to the venue listed below.   Pt doing well this am, min c/o nausea during mobility, amb ~80'--uses RW at baseline; she has many questions regarding plan of care, her dx, etc--will defer to MD;     Follow Up Recommendations No PT follow up;Home health PT--depending on progress    Equipment Recommendations       Recommendations for Other Services       Precautions / Restrictions Restrictions Weight Bearing Restrictions: No      Mobility  Bed Mobility Overal bed mobility: Modified Independent                Transfers Overall transfer level: Needs assistance Equipment used: Rolling walker (2 wheeled) Transfers: Sit to/from Stand Sit to Stand: Min guard         General transfer comment: cues for RW safety, hand placement  Ambulation/Gait Ambulation/Gait assistance: Min assist;Min guard Ambulation Distance (Feet): 80 Feet Assistive device: Rolling walker (2 wheeled) Gait Pattern/deviations: Step-through pattern;Decreased stride length;Drifts right/left     General Gait Details: assist to maneuver RW, cues for RW distance from self  Stairs            Wheelchair Mobility    Modified Rankin (Stroke Patients Only)       Balance Overall balance assessment: Needs assistance   Sitting balance-Leahy Scale:  Good       Standing balance-Leahy Scale: Fair                               Pertinent Vitals/Pain Pain Assessment: No/denies pain    Home Living Family/patient expects to be discharged to:: Private residence Living Arrangements: Children (son that has Down's Syndrome) Available Help at Discharge: Friend(s) Type of Home: House       Home Layout: Two level;Able to live on main level with bedroom/bathroom Home Equipment: Dan HumphreysWalker - 2 wheels Additional Comments: one  other local son and one in [place], they are assisting in her other son's care/supervision    Prior Function Level of Independence: Independent with assistive device(s)         Comments: amb with RW at baseline     Hand Dominance        Extremity/Trunk Assessment   Upper Extremity Assessment: Overall WFL for tasks assessed           Lower Extremity Assessment: Overall WFL for tasks assessed         Communication   Communication: No difficulties  Cognition Arousal/Alertness: Awake/alert Behavior During Therapy: WFL for tasks assessed/performed Overall Cognitive Status: Within Functional Limits for tasks assessed                 General Comments: lots of question regarding plan and her dx    General Comments      Exercises  Assessment/Plan    PT Assessment Patient needs continued PT services  PT Problem List Decreased activity tolerance;Decreased balance;Decreased mobility;Decreased knowledge of use of DME          PT Treatment Interventions DME instruction;Gait training;Functional mobility training;Therapeutic exercise;Therapeutic activities;Patient/family education    PT Goals (Current goals can be found in the Care Plan section)  Acute Rehab PT Goals Patient Stated Goal: get better PT Goal Formulation: With patient Time For Goal Achievement: 03/23/16 Potential to Achieve Goals: Good    Frequency Min 3X/week   Barriers to discharge         Co-evaluation               End of Session   Activity Tolerance: Patient tolerated treatment well Patient left: in chair;with call bell/phone within reach;Other (comment)           Time: 1610-9604: 0939-0956 PT Time Calculation (min) (ACUTE ONLY): 17 min   Charges:   PT Evaluation $PT Eval Low Complexity: 1 Procedure     PT G Codes:        [name] 03/16/2016, 10:05 AM

## 2016-03-16 NOTE — Progress Notes (Signed)
Patient ID: [name], female   DOB: 01-Dec-1955, 60 y.o.   MRN: [id number]  [facility]     Subjective: Vomited twice yesterday after trying to eat solid food. Feeling better this morning. Denies n/v. Tolerating fluids.  Objective: Vital signs in last 24 hours: Temp:  [97.6 F (36.4 C)-98.9 F (37.2 C)] 98.9 F (37.2 C) (12/11 0505) Pulse Rate:  [62-69] 69 (12/11 0505) Resp:  [18-19] 18 (12/11 0505) BP: (146-147)/(63-73) 147/73 (12/11 0505) SpO2:  [95 %-98 %] 98 % (12/11 0505) Last BM Date:  (per pt 5 days ago)  Intake/Output from previous day: 12/10 0701 - 12/11 0700 In: 180 [P.O.:180] Out: 200 [Drains:200] Intake/Output this shift: No intake/output data recorded.  PE: Gen:  Alert, NAD, cooperative Pulm:  Effort normal Abd: Soft, NT, tender around drain, drain with bilious output  Lab Results:   Recent Labs  03/14/16 0533 03/15/16 0545  WBC 5.4 7.3  HGB 10.0* 10.1*  HCT 29.8* 29.7*  PLT 165 205   BMET  Recent Labs  03/15/16 0545 03/16/16 0532  NA 137 133*  K 3.7 3.6  CL 103 102  CO2 26 25  GLUCOSE 119* 162*  BUN 8 8  CREATININE 0.67 0.53  CALCIUM 7.6* 7.7*   PT/INR No results for input(s): LABPROT, INR in the last 72 hours. CMP     Component Value Date/Time   NA 133 (L) 03/16/2016 0532   K 3.6 03/16/2016 0532   CL 102 03/16/2016 0532   CO2 25 03/16/2016 0532   GLUCOSE 162 (H) 03/16/2016 0532   BUN 8 03/16/2016 0532   CREATININE 0.53 03/16/2016 0532   CALCIUM 7.7 (L) 03/16/2016 0532   PROT 5.2 (L) 03/14/2016 0533   PROT 6.7 05/14/2014 0945   ALBUMIN 2.4 (L) 03/14/2016 0533   AST 11 (L) 03/14/2016 0533   ALT 25 03/14/2016 0533   ALKPHOS 94 03/14/2016 0533   BILITOT 0.6 03/14/2016 0533   GFRNONAA >60 03/16/2016 0532   GFRAA >60 03/16/2016 0532   Lipase     Component Value Date/Time   LIPASE 11 03/08/2016 1005       Studies/Results: No results found.  Anti-infectives: Anti-infectives    Start      Dose/Rate Route Frequency Ordered Stop   03/15/16 1600  sulfamethoxazole-trimethoprim (BACTRIM DS,SEPTRA DS) 800-160 MG per tablet 1 tablet     1 tablet Oral Every 12 hours 03/15/16 1528     03/13/16 2200  cefTRIAXone (ROCEPHIN) 2 g in dextrose 5 % 50 mL IVPB  Status:  Discontinued     2 g 100 mL/hr over 30 Minutes Intravenous Every 24 hours 03/13/16 0143 03/15/16 1528   03/13/16 1000  metroNIDAZOLE (FLAGYL) IVPB 500 mg  Status:  Discontinued     500 mg 100 mL/hr over 60 Minutes Intravenous Every 8 hours 03/13/16 0954 03/15/16 1047   03/12/16 2245  cefTRIAXone (ROCEPHIN) 2 g in dextrose 5 % 50 mL IVPB     2 g 100 mL/hr over 30 Minutes Intravenous  Once 03/12/16 2239 03/12/16 2352       Assessment/Plan Cholecystitis Percutaneous cholecystostomy tube by IR, 03/13/16.  Will need f/u with Dr Daphine DeutscherMartin for interval cholecystectomy   Large left ovarian prominence- outpatient follow up  PMH CVA- plavix restarted 12/10 DM with related neuropathy and retinopathy HTN  Anemia   FEN:  Diet: low fat diet as tolerated ID: Rocephin 12/7>>12/10, Flagyl 12/8>>12/10, bactrim 12/10>>  Plan: Continue low fat diet as tolerated. Continue drain  and antibiotics. Ready for discharge with OP follow-up with Dr. Daphine DeutscherMartin once able to tolerate diet.    LOS: 4 days    Edson SnowballBROOKE A MILLER , [place] [facility] 03/16/2016, 10:46 AM Pager: [phone] Consults: [phone] Mon-Fri 7:00 am-4:30 pm Sat-Sun 7:00 am-11:30 am

## 2016-03-16 NOTE — Progress Notes (Signed)
.                                                                                   Patient Demographics:    Marie [name], is a 60 y.o. female, DOB - Nov 21, 1955, NWG:[id number]  Admit date - 03/12/2016   Admitting Physician [name] A Smith, MD  Outpatient Primary MD for the patient is [name],[name], MD  LOS - 4  Chief Complaint  Patient presents with  . Weakness  . Nausea      Brief Hx  60 y.o. female with medical history significant of HTN, HLD, DM type II, CVA; who presents with at least a 5 day history of abdominal pain, nausea, and vomiting. Previously Evaluated 4 days ago in the ED for same symptoms, but was discharged home with antiemetics and pain medication to follow-up with her PCP. In the Ed- CT imaging of the abdomen revealed acute cholecystitis. She was given 2 g of IV Rocephin due to penicillin allergy history. Dr. Ezzard StandingNewman of general surgery was consulted and recommended due to the patient's comorbid duties [facility] -   Subjective:    Marie [name] having nausea and vomiting after eating last night but much better today.    Assessment  & Plan :    Principal Problem:   Acute cholecystitis Active Problems:   Diabetes with retinopathy (HCC)   History of CVA (cerebrovascular accident) (multiple, recurrent)   Left ovarian enlargement   Leukocytosis  Acute cholecystitis- Patient presents with nausea vomiting abdominal pain found to have acute cholecystitis on imaging. No fevers white counts trended down from 12.9-9.9. Started on IV Rocephin 12/7  - DC Rocephin as the E coli from gallbladder fluid culture resistant, Bactrim DS will be ordered.   - Bactrim DS ordered for antibiotic coverage or E coli ESBL - Morphine IV prn pain - Appreciate general surgery,recommendations- recommendations for the percutaneous cholecystectomy tube by IR -Appreciate IR evaluation, s/p percutaneous cholecystectomy drain -Plan to follow up outpatient Dr. Daphine DeutscherMartin in 6 weeks for  cholecystectomy  Possible UTI - Follow-up urine culture - Covered with antibiotics as seen above - Zofran prn - advance diet as tolerated.  Diabetes mellitus type 2 -home meds- metformin 1000 mg twice a day. Initial CBG 227 on admission. - Hypoglycemic protocols - Held metformin - blood sugars have been stable  CBG (last 3)   Recent Labs  03/16/16 0023 03/16/16 0643 03/16/16 1304  GLUCAP 196* 161* 146*   Hypokalemia: Initial troponin is 3.1, repeat 3.2, after IV KCl. - Potassium chloride 30 mEq IV  - Bmet AM - Mg OK, repeat KCL 30 meq    Left ovarian lesion: Incidentally noted 3.8 cm with possible cystic component on CT abdomen and pelvis.  - Follow-up with a pelvic ultrasound when acute issues resolve  HTN, History of CVA: Noted back in 06/2013 with without residual deficit - restart plavix 12/10 (ok with surgery) - Norvasc restarted 12/10  Hyponatremia: Initial sodium 129,  resolved now 138. Likely  hypovolemic hyponatremia due to the nausea vomiting, reduce by mouth intake   Anemia:  hemoglobin 11.8 on admission. Patient with vitals stable at this time and no  reported bleeding. - Continue to monitor  Code Status : Full  Disposition Plan  : Hopefully can go home tomorrow  Consults  : Gen. Surgery, IR  DVT Prophylaxis  :  SCDs for now pending procedure.  Lab Results  Component Value Date   PLT 205 03/15/2016    Inpatient Medications  Scheduled Meds: . amLODipine  5 mg Oral Daily  . clopidogrel  75 mg Oral Daily  . docusate sodium  100 mg Oral BID  . lisinopril  10 mg Oral Daily  . sodium phosphate  1 enema Rectal Once  . sulfamethoxazole-trimethoprim  1 tablet Oral Q12H  . venlafaxine XR  37.5 mg Oral Q breakfast   Continuous Infusions:  PRN Meds:.albuterol, morphine injection, ondansetron (ZOFRAN) IV  Anti-infectives    Start     Dose/Rate Route Frequency Ordered Stop   03/15/16 1600  sulfamethoxazole-trimethoprim (BACTRIM DS,SEPTRA DS) 800-160  MG per tablet 1 tablet     1 tablet Oral Every 12 hours 03/15/16 1528     03/13/16 2200  cefTRIAXone (ROCEPHIN) 2 g in dextrose 5 % 50 mL IVPB  Status:  Discontinued     2 g 100 mL/hr over 30 Minutes Intravenous Every 24 hours 03/13/16 0143 03/15/16 1528   03/13/16 1000  metroNIDAZOLE (FLAGYL) IVPB 500 mg  Status:  Discontinued     500 mg 100 mL/hr over 60 Minutes Intravenous Every 8 hours 03/13/16 0954 03/15/16 1047   03/12/16 2245  cefTRIAXone (ROCEPHIN) 2 g in dextrose 5 % 50 mL IVPB     2 g 100 mL/hr over 30 Minutes Intravenous  Once 03/12/16 2239 03/12/16 2352        Objective:   Vitals:   03/15/16 0521 03/15/16 1445 03/15/16 2040 03/16/16 0505  BP: (!) 150/61 (!) 146/63 (!) 147/68 (!) 147/73  Pulse: 62 62 64 69  Resp: 18 19 19 18   Temp: 98.2 F (36.8 C) 98.4 F (36.9 C) 97.6 F (36.4 C) 98.9 F (37.2 C)  TempSrc: Oral Oral Oral Oral  SpO2: 97% 95% 95% 98%    Wt Readings from Last 3 Encounters:  03/08/16 56.2 kg (124 lb)  07/01/15 55.3 kg (122 lb)  01/23/15 56.2 kg (124 lb)     Intake/Output Summary (Last 24 hours) at 03/16/16 1423 Last data filed at 03/16/16 0400  Gross per 24 hour  Intake               60 ml  Output              200 ml  Net             -140 ml   Physical Exam  Gen:- Awake Alert, Oriented. Appears ill. NAD.  HEENT:- [place].AT, No sclera icterus Neck-Supple Neck Lungs-  CTAB, no wheezes no crackles. CV- S1, S2 normal, regular Abd-  +ve B.Sounds, Abd Soft,mild generalized tenderness tenderness, biliary drain in place   Extremity/Skin:- No  edema,  warm and well perfused, moving all extremities    Data Review:   Micro Results Recent Results (from the past 240 hour(s))  Aerobic/Anaerobic Culture (surgical/deep wound)     Status: None (Preliminary result)   Collection Time: 03/13/16 12:48 PM  Result Value Ref Range Status   Specimen Description DRAINAGE GALLBLADER  Final   Special Requests Normal  Final   Gram Stain   Final    ABUNDANT  WBC PRESENT, PREDOMINANTLY PMN FEW GRAM NEGATIVE RODS Performed at [facility]    Culture  Final    MODERATE ESCHERICHIA COLI Confirmed Extended Spectrum Beta-Lactamase Producer (ESBL) NO ANAEROBES ISOLATED; CULTURE IN PROGRESS FOR 5 DAYS    Report Status PENDING  Incomplete   Organism ID, Bacteria ESCHERICHIA COLI  Final      Susceptibility   Escherichia coli - MIC*    AMPICILLIN >=32 RESISTANT Resistant     CEFAZOLIN >=64 RESISTANT Resistant     CEFEPIME <=1 RESISTANT Resistant     CEFTAZIDIME <=1 RESISTANT Resistant     CEFTRIAXONE >=64 RESISTANT Resistant     CIPROFLOXACIN >=4 RESISTANT Resistant     GENTAMICIN <=1 SENSITIVE Sensitive     IMIPENEM <=0.25 SENSITIVE Sensitive     TRIMETH/SULFA <=20 SENSITIVE Sensitive     AMPICILLIN/SULBACTAM 4 SENSITIVE Sensitive     PIP/TAZO <=4 SENSITIVE Sensitive     Extended ESBL POSITIVE Resistant     * MODERATE ESCHERICHIA COLI    Radiology Reports Dg Chest 2 View  Result Date: 03/08/2016 CLINICAL DATA:  Vomiting for 2 days. Central chest pain. Hypertension. EXAM: CHEST  2 VIEW COMPARISON:  None. FINDINGS: The heart size and mediastinal contours are within normal limits. Both lungs are clear. The visualized skeletal structures are unremarkable. Loop recorder. IMPRESSION: No active cardiopulmonary disease. Electronically Signed   By: Elsie Stain M.D.   On: 03/08/2016 09:57   Ct Abdomen Pelvis W Contrast  Result Date: 03/12/2016 CLINICAL DATA:  Right lower quadrant pain. Vomiting, decreased p.o. intake, right-sided back pain and nausea. EXAM: CT ABDOMEN AND PELVIS WITH CONTRAST TECHNIQUE: Multidetector CT imaging of the abdomen and pelvis was performed using the standard protocol following bolus administration of intravenous contrast. CONTRAST:  ISOVUE-300 IOPAMIDOL (ISOVUE-300) INJECTION 61% COMPARISON:  None. FINDINGS: Lower chest: Right greater than left lower lobe atelectasis. Calcified right lower lobe granuloma. Trace  right pleural thickening. Hepatobiliary: Gallbladder is distended containing calcified gallstone with marked pericholecystic inflammation. There is gallbladder wall enhancement and thickening, pericholecystic soft tissue stranding and small amount of fluid. No biliary dilatation or calcified choledocholithiasis. There is a 2.1 cm hypodense lesion in the dome of the right lobe of the liver with peripheral contrast puddling consistent with hemangioma. Pancreas: Focal calcification in the pancreatic tail. No ductal dilatation or inflammation. Spleen: Normal in size without focal abnormality. Adrenals/Urinary Tract: No adrenal nodule. No hydronephrosis or perinephric edema. Homogeneous enhancement. Tiny cortical hypodensities in the left kidney, too small to characterize but likely cysts. Urinary bladder is distended without wall thickening. Stomach/Bowel: Para pyloric and duodenal wall thickening likely reactive related to adjacent gallbladder inflammation. There is a duodenum diverticulum, no para diverticular inflammation. No small bowel dilatation. No bowel obstruction. Normal appendix. Moderate stool in the right colon, with small volume of stool distally. Vascular/Lymphatic: Abdominal aortic atherosclerosis without aneurysm. Small retroperitoneal lymph nodes are likely reactive. Reproductive: Calcified uterine fibroid, uterus is heterogeneous. The left ovary is prominent size for age measuring at least 3.8 cm with probable small complex cyst. Other: No free air.  No intra- abdominal abscess. Musculoskeletal: Degenerative disc disease at L2-L3. There are no acute or suspicious osseous abnormalities. IMPRESSION: 1. Acute cholecystitis.  No biliary dilatation. 2. Insula finding of left ovarian prominence for age post menopausal status during 3.8 cm with possible cystic component. Recommend nonemergent pelvic ultrasound after coalescence of acute illness. 3. Abdominal atherosclerosis, no aneurysm.  Uterine fibroids.  Electronically Signed   By: Rubye Oaks M.D.   On: 03/12/2016 22:27   Ir Perc Cholecystostomy  Result Date: 03/13/2016 INDICATION: Acute cholecystitis EXAM:  ULTRASOUND AND FLUOROSCOPIC-GUIDED CHOLECYSTOSTOMY TUBE PLACEMENT COMPARISON:  CT abdomen and pelvis - 03/12/2016 MEDICATIONS: The patient is currently admitted to the hospital and on intravenous antibiotics. Antibiotics were administered within an appropriate time frame prior to skin puncture. ANESTHESIA/SEDATION: Moderate (conscious) sedation was employed during this procedure. A total of Versed 2 mg and Fentanyl 50 mcg was administered intravenously. Moderate Sedation Time: 10 minutes. The patient's level of consciousness and vital signs were monitored continuously by radiology nursing throughout the procedure under my direct supervision. CONTRAST:  10mL ISOVUE-300 IOPAMIDOL (ISOVUE-300) INJECTION 61% - administered into the gallbladder fossa. FLUOROSCOPY TIME:  36 seconds (7 mGy) COMPLICATIONS: None immediate. PROCEDURE: Informed written consent was obtained from the patient after a discussion of the risks, benefits and alternatives to treatment. Questions regarding the procedure were encouraged and answered. A timeout was performed prior to the initiation of the procedure. The right upper abdominal quadrant was prepped and draped in the usual sterile fashion, and a sterile drape was applied covering the operative field. Maximum barrier sterile technique with sterile gowns and gloves were used for the procedure. A timeout was performed prior to the initiation of the procedure. Local anesthesia was provided with 1% lidocaine with epinephrine. Ultrasound scanning of the right upper quadrant demonstrates a markedly dilated gallbladder. Of note, the patient reported pain with ultrasound imaging over the gallbladder. Utilizing a transhepatic approach, a 22 gauge needle was advanced into the gallbladder under direct ultrasound guidance. An ultrasound  image was saved for documentation purposes. Appropriate intraluminal puncture was confirmed with the efflux of bile and advancement of an 0.018 wire into the gallbladder lumen. The needle was exchanged for an Accustick set. A small amount of contrast was injected to confirm appropriate intraluminal positioning. Over a Benson wire, a 10.2-French Cook cholecystomy tube was advanced into the gallbladder fossa, coiled and locked. A small amount of purulent appearing bile was aspirated, capped and sent to the laboratory for analysis. A small amount of contrast was injected as several post procedural spot radiographic images were obtained in various obliquities. The catheter was secured to the skin with suture, connected to a drainage bag and a dressing was placed. The patient tolerated the procedure well without immediate post procedural complication. IMPRESSION: Successful ultrasound and fluoroscopic guided placement of a 10.2 French cholecystostomy tube. A small sample of purulent appearing bile was capped and sent to the laboratory for analysis. Electronically Signed   By: [name] M.D.   On: 03/13/2016 12:46     CBC  Recent Labs Lab 03/12/16 1700 03/13/16 0540 03/14/16 0533 03/15/16 0545  WBC 12.9* 9.9 5.4 7.3  HGB 11.8* 10.2* 10.0* 10.1*  HCT 35.2* 30.3* 29.8* 29.7*  PLT 186 159 165 205  MCV 83.8 84.6 85.4 82.5  MCH 28.1 28.5 28.7 28.1  MCHC 33.5 33.7 33.6 34.0  RDW 12.5 12.6 13.0 12.8  LYMPHSABS  --   --   --  1.5  MONOABS  --   --   --  0.8  EOSABS  --   --   --  0.1  BASOSABS  --   --   --  0.0   Chemistries   Recent Labs Lab 03/12/16 1700 03/12/16 2247 03/13/16 0536 03/13/16 0540 03/14/16 0533 03/15/16 0545 03/16/16 0532  NA 129*  --   --  138 138 137 133*  K 3.1*  --   --  3.2* 3.1* 3.7 3.6  CL 91*  --   --  105 105 103 102  CO2 28  --   --  27 27 26 25   GLUCOSE 227*  --   --  171* 152* 119* 162*  BUN 14  --   --  9 8 8 8   CREATININE 0.79  --   --  0.67 0.67 0.67 0.53   CALCIUM 8.6*  --   --  7.5* 7.5* 7.6* 7.7*  MG  --   --  2.0  --  2.0 2.0  --   AST  --  20  --  19 11*  --   --   ALT  --  36  --  33 25  --   --   ALKPHOS  --  103  --  101 94  --   --   BILITOT  --  1.0  --  0.7 0.6  --   --    ------------------------------------------------------------------------------------------------------------------ No results for input(s): CHOL, HDL, LDLCALC, TRIG, CHOLHDL, LDLDIRECT in the last 72 hours.  Lab Results  Component Value Date   HGBA1C 8.1 (H) 05/14/2014   ------------------------------------------------------------------------------------------------------------------ No results for input(s): TSH, T4TOTAL, T3FREE, THYROIDAB in the last 72 hours.  Invalid input(s): FREET3 ------------------------------------------------------------------------------------------------------------------ No results for input(s): VITAMINB12, FOLATE, FERRITIN, TIBC, IRON, RETICCTPCT in the last 72 hours.  Coagulation profile  Recent Labs Lab 03/13/16 0834  INR 1.10    No results for input(s): DDIMER in the last 72 hours.  Cardiac Enzymes  Recent Labs Lab 03/12/16 1950  TROPONINI <0.03   ------------------------------------------------------------------------------------------------------------------ No results found for: BNP  Standley Dakinslanford Usiel Astarita M.D on 03/16/2016 at 2:23 PM  Between 7am to 7pm - Pager - [phone]  After 7pm go to [email or web address] - password [email or web address]  [facility] -  Office  [phone]  Dragon dictation system was used to create this note, attempts have been made to correct errors, however presence of uncorrected errors is not a reflection quality of care provided

## 2016-03-17 ENCOUNTER — Encounter (HOSPITAL_COMMUNITY): Payer: Self-pay

## 2016-03-17 LAB — BASIC METABOLIC PANEL
ANION GAP: 7 (ref 5–15)
BUN: 6 mg/dL (ref 6–20)
CO2: 29 mmol/L (ref 22–32)
Calcium: 8.1 mg/dL — ABNORMAL LOW (ref 8.9–10.3)
Chloride: 99 mmol/L — ABNORMAL LOW (ref 101–111)
Creatinine, Ser: 0.72 mg/dL (ref 0.44–1.00)
Glucose, Bld: 139 mg/dL — ABNORMAL HIGH (ref 65–99)
POTASSIUM: 3.8 mmol/L (ref 3.5–5.1)
SODIUM: 135 mmol/L (ref 135–145)

## 2016-03-17 LAB — GLUCOSE, CAPILLARY: GLUCOSE-CAPILLARY: 150 mg/dL — AB (ref 65–99)

## 2016-03-17 MED ORDER — DOCUSATE SODIUM 100 MG PO CAPS: 100.0000 mg | ORAL_CAPSULE | Freq: Two times a day (BID) | ORAL | 0 refills | Status: DC

## 2016-03-17 MED ORDER — POLYETHYLENE GLYCOL 3350 17 G PO PACK: 17.0000 g | PACK | Freq: Every day | ORAL | Status: DC

## 2016-03-17 MED ORDER — SULFAMETHOXAZOLE-TRIMETHOPRIM 800-160 MG PO TABS: 1.0000 | ORAL_TABLET | Freq: Two times a day (BID) | ORAL | 0 refills | Status: AC

## 2016-03-17 NOTE — Progress Notes (Signed)
Patient ID: Marie RinksFarida Matousek, female   DOB: 1956-02-08, 60 y.o.   MRN: [id number]    Referring Physician(s): Dr. Luretha MurphyMatthew Martin  Supervising Physician: Oley BalmHassell, Daniel  [facility]  Chief Complaint: Acute cholecystitis  Subjective: Patient feeling well.  Needs to have a BM before she can go home.  Allergies: Penicillins and Other  Medications: Prior to Admission medications   Medication Sig Start Date End Date Taking? Authorizing Provider  amLODipine (NORVASC) 5 MG tablet Take 5 mg by mouth daily 12/20/15  Yes Historical Provider, MD  aspirin EC 81 MG tablet Take 1 tablet (81 mg total) by mouth daily. 06/07/13  Yes Levert FeinsteinYijun Yan, MD  atorvastatin (LIPITOR) 20 MG tablet Take 1 tablet (20 mg total) by mouth daily. Patient taking differently: Take 20 mg by mouth 2 (two) times daily.  01/23/15  Yes Levert FeinsteinYijun Yan, MD  cholecalciferol (VITAMIN D) 1000 UNITS tablet Take 1,000 Units by mouth 2 (two) times daily.    Yes Historical Provider, MD  clopidogrel (PLAVIX) 75 MG tablet Take 1 tablet (75 mg total) by mouth daily. 07/10/15  Yes Levert FeinsteinYijun Yan, MD  Coenzyme Q10 (CO Q 10) 100 MG CAPS Take 200 mg by mouth daily.   Yes Historical Provider, MD  Ginkgo Biloba 500 MG CAPS Take 500 mg by mouth daily.    Yes Historical Provider, MD  lisinopril (PRINIVIL,ZESTRIL) 10 MG tablet Take 1 tablet (10 mg total) by mouth daily. 01/23/15  Yes Levert FeinsteinYijun Yan, MD  metFORMIN (GLUCOPHAGE) 500 MG tablet Take 1,000 mg by mouth 2 (two) times daily.  01/15/15  Yes Historical Provider, MD  Multiple Vitamins-Minerals (CENTRUM) tablet Take 1 tablet by mouth daily.   Yes Historical Provider, MD  Omega-3 Fatty Acids (FISH OIL) 1000 MG CAPS Take 1,000 mg by mouth daily.    Yes Historical Provider, MD  ondansetron (ZOFRAN ODT) 4 MG disintegrating tablet 4mg  ODT q4 hours prn nausea/vomit Patient taking differently: Take 4 mg by mouth every 4 (four) hours as needed for nausea or vomiting. 4mg  ODT q4 hours prn nausea/vomit 03/08/16   Yes Melene Planan Floyd, DO  ALPRAZolam Prudy Feeler(XANAX) 0.5 MG tablet Take 1-2 tablets thirty minutes prior to MRI.  May take one additional tablet before entering scanner, if needed.  MUST HAVE DRIVER. Patient not taking: Reported on 03/12/2016 12/05/15   Levert FeinsteinYijun Yan, MD  oxyCODONE (ROXICODONE) 5 MG immediate release tablet Take 0.5 tablets (2.5 mg total) by mouth every 4 (four) hours as needed for severe pain. Patient not taking: Reported on 03/12/2016 03/08/16   Melene Planan Floyd, DO  venlafaxine XR (EFFEXOR XR) 37.5 MG 24 hr capsule Take 1 capsule (37.5 mg total) by mouth daily with breakfast. Patient not taking: Reported on 03/12/2016 11/25/15   Levert FeinsteinYijun Yan, MD    Vital Signs: BP (!) 148/66 (BP Location: Right Arm)   Pulse 65   Temp 98.2 F (36.8 C) (Oral)   Resp 18   Ht 5\' 2"  (1.575 m)   Wt 123 lb 7.3 oz (56 kg)   SpO2 100%   BMI 22.58 kg/m   Physical Exam: Abd: soft, NT, except slightly sore around her drain, as expected,  Drain with appropriate bilious drainage present.  Site is c/d/i  Imaging: No results found.  Labs:  CBC:  Recent Labs  03/12/16 1700 03/13/16 0540 03/14/16 0533 03/15/16 0545  WBC 12.9* 9.9 5.4 7.3  HGB 11.8* 10.2* 10.0* 10.1*  HCT 35.2* 30.3* 29.8* 29.7*  PLT 186 159 165 205    COAGS:  Recent Labs  03/13/16 0834  INR 1.10    BMP:  Recent Labs  03/14/16 0533 03/15/16 0545 03/16/16 0532 03/17/16 0532  NA 138 137 133* 135  K 3.1* 3.7 3.6 3.8  CL 105 103 102 99*  CO2 27 26 25 29   GLUCOSE 152* 119* 162* 139*  BUN 8 8 8 6   CALCIUM 7.5* 7.6* 7.7* 8.1*  CREATININE 0.67 0.67 0.53 0.72  GFRNONAA >60 >60 >60 >60  GFRAA >60 >60 >60 >60    LIVER FUNCTION TESTS:  Recent Labs  03/08/16 1005 03/12/16 2247 03/13/16 0540 03/14/16 0533  BILITOT 1.3* 1.0 0.7 0.6  AST 21 20 19  11*  ALT 27 36 33 25  ALKPHOS 78 103 101 94  PROT 8.1 5.5* 5.6* 5.2*  ALBUMIN 4.6 2.3* 2.5* 2.4*    Assessment and Plan: 1. S/p perc chole drain placement on 12/8 for acute  cholecystitis -patient is stable and plans for DC home when she is able to have a BM -we will have her follow up in our clinic in about 6 weeks for a drain injection and drain follow up to determine if her drain can be capped. -further plans regarding interval cholecystectomy per [facility].  Electronically Signed: Letha CapeSBORNE,Emmarie Sannes E 03/17/2016, 12:44 PM   I spent a total of 15 Minutes at the the patient's bedside AND on the patient's hospital floor or unit, greater than 50% of which was counseling/coordinating care for acute cholecystitis

## 2016-03-17 NOTE — Progress Notes (Signed)
Patient ID: Marie [name], female   DOB: 27-Jul-1955, 60 y.o.   MRN: [id number]  [facility]     Subjective: Denies current abdominal pain. Tolerated dinner and breakfast with no n/v. Suppository did not help, planning enema today.  Objective: Vital signs in last 24 hours: Temp:  [97.3 F (36.3 C)-98.2 F (36.8 C)] 98.2 F (36.8 C) (12/11 2010) Pulse Rate:  [65] 65 (12/11 2010) Resp:  [18] 18 (12/11 2010) BP: (148-161)/(66-74) 148/66 (12/11 2010) SpO2:  [98 %-100 %] 100 % (12/11 2010) Weight:  [123 lb 7.3 oz (56 kg)] 123 lb 7.3 oz (56 kg) (12/11 1508) Last BM Date: 03/16/16  Intake/Output from previous day: 12/11 0701 - 12/12 0700 In: 360 [P.O.:360] Out: 75 [Drains:75] Intake/Output this shift: Total I/O In: 240 [P.O.:240] Out: -   PE: Gen:  Alert, NAD, cooperative Pulm:  Effort normal Abd: Soft, NT, ND, drain with bilious output   Lab Results:   Recent Labs  03/15/16 0545  WBC 7.3  HGB 10.1*  HCT 29.7*  PLT 205   BMET  Recent Labs  03/16/16 0532 03/17/16 0532  NA 133* 135  K 3.6 3.8  CL 102 99*  CO2 25 29  GLUCOSE 162* 139*  BUN 8 6  CREATININE 0.53 0.72  CALCIUM 7.7* 8.1*   PT/INR No results for input(s): LABPROT, INR in the last 72 hours. CMP     Component Value Date/Time   NA 135 03/17/2016 0532   K 3.8 03/17/2016 0532   CL 99 (L) 03/17/2016 0532   CO2 29 03/17/2016 0532   GLUCOSE 139 (H) 03/17/2016 0532   BUN 6 03/17/2016 0532   CREATININE 0.72 03/17/2016 0532   CALCIUM 8.1 (L) 03/17/2016 0532   PROT 5.2 (L) 03/14/2016 0533   PROT 6.7 05/14/2014 0945   ALBUMIN 2.4 (L) 03/14/2016 0533   AST 11 (L) 03/14/2016 0533   ALT 25 03/14/2016 0533   ALKPHOS 94 03/14/2016 0533   BILITOT 0.6 03/14/2016 0533   GFRNONAA >60 03/17/2016 0532   GFRAA >60 03/17/2016 0532   Lipase     Component Value Date/Time   LIPASE 11 03/08/2016 1005       Studies/Results: No results found.  Anti-infectives: Anti-infectives     Start     Dose/Rate Route Frequency Ordered Stop   03/15/16 1600  sulfamethoxazole-trimethoprim (BACTRIM DS,SEPTRA DS) 800-160 MG per tablet 1 tablet     1 tablet Oral Every 12 hours 03/15/16 1528     03/13/16 2200  cefTRIAXone (ROCEPHIN) 2 g in dextrose 5 % 50 mL IVPB  Status:  Discontinued     2 g 100 mL/hr over 30 Minutes Intravenous Every 24 hours 03/13/16 0143 03/15/16 1528   03/13/16 1000  metroNIDAZOLE (FLAGYL) IVPB 500 mg  Status:  Discontinued     500 mg 100 mL/hr over 60 Minutes Intravenous Every 8 hours 03/13/16 0954 03/15/16 1047   03/12/16 2245  cefTRIAXone (ROCEPHIN) 2 g in dextrose 5 % 50 mL IVPB     2 g 100 mL/hr over 30 Minutes Intravenous  Once 03/12/16 2239 03/12/16 2352       Assessment/Plan Cholecystitis Percutaneous cholecystostomy tube by IR, 03/13/16. Will need f/u with Dr Daphine DeutscherMartin for interval cholecystectomy   Large left ovarian prominence- outpatient follow up  PMH CVA- plavix restarted 12/10 DM with related neuropathy and retinopathy HTN  Anemia   FEN: Diet: low fat diet as tolerated ID: Rocephin 12/7>>12/10, Flagyl 12/8>>12/10, bactrim 12/10>>  Plan: Continue  low fat diet as tolerated. Continue drain and antibiotics. Ready for discharge with OP follow-up with Dr. Daphine DeutscherMartin once able to have BM    LOS: 5 days    Edson SnowballBROOKE A MILLER , [facility] 03/17/2016, 10:45 AM Pager: [phone] Consults: [phone] Mon-Fri 7:00 am-4:30 pm Sat-Sun 7:00 am-11:30 am

## 2016-03-17 NOTE — Discharge Summary (Signed)
Physician Discharge Summary  Marie [name] ZOX:[id number] DOB: 1955/11/27 DOA: 03/12/2016  PCP: Gaye Alken, MD  Admit date: 03/12/2016 Discharge date: 03/17/2016  Admitted From: Home  Disposition:  Home   Recommendations for Outpatient Follow-up:  1. Follow up with PCP in 1-2 weeks  Pt needs a follow up pelvic ultrasound to be scheduled 2. Follow up with Dr Daphine Deutscher - General Surgery in 6 weeks 3. Follow up with [facility] in 6 weeks (they will call you with appt) 4. Please obtain BMP/CBC in 1-2 week 5. Monitor blood sugars closely at home, report to primary care provider 6. Low fat diet ordered  Discharge Condition: STABLE CODE STATUS: FULL  Brief/Interim Summary: HPI: Marie [name] is a 60 y.o. female with medical history significant of HTN, HLD, DM type II, CVA; who presents with at least a 5 day history of abdominal pain, nausea, and vomiting. Patient describes a sharp epigastric to right quadrant abdominal pain that waxes and wanes in intensity. Pain radiates up into her chest and into her low back. Movement seems to worsen symptoms. Previously Evaluated 4 days ago in the ED for same symptoms, but was discharged home with antiemetics and pain medication to follow-up with her PCP. Patient reports symptoms were not significantly relieved with medications given and have progressively worsened. She reports over the last 5 days or so she is not been able to eat much of anything and has not had a bowel movement. Denies having any fever, chills, or shortness of breath. She was unable to follow-up with her PCP until today.   ED Course: Patient was found to have abnormal UA. CT imaging of the abdomen revealed acute cholecystitis. She was given 2 g of IV Rocephin due to penicillin allergy history. Dr. Ezzard Standing of general surgery was consulted and recommended due to the patient's comorbid duties to admit to [facility].  Brief Hx 60 y.o.femalewith medical history significant of HTN, HLD, DM  type II, CVA; who presents with at least a 5 day history of abdominalpain, nausea, and vomiting.Previously Evaluated 4 days ago in the ED for same symptoms, but was discharged home with antiemetics and pain medication to follow-up with her PCP. In the Ed-CT imaging of the abdomen revealed acute cholecystitis. She was given 2 g of IV Rocephin due to penicillin allergy history. Dr. Ezzard Standing of general surgery was consulted and recommended due to the patient's comorbid duties to admit to [facility].  Acute cholecystitis- Patient presents with nausea vomiting abdominal pain found to have acute cholecystitis on imaging. No fevers white counts trended down from 12.9-9.9.   - DC Rocephin as the E coli from gallbladder fluid culture resistant, Bactrim DS ordered.   - Bactrim DS ordered for antibiotic coverage or E coli ESBL - Appreciate general surgery,recommendations- recommendations for the percutaneous cholecystectomy tube by IR -Appreciate IR evaluation, s/p percutaneous cholecystectomy drain - Pt will need to follow up with IR in 6 weeks to evaluate drain.  Drain care instructions given prior to discharge.  -Plan to follow up outpatient Dr. Daphine Deutscher in 6 weeks for cholecystectomy - Pt tolerated low fat diet before discharge and had a bowel movement before discharge  Diabetes mellitus type 2 -home meds- metformin 1000 mg twice a day. Initial CBG 227 on admission. - Hypoglycemic protocols - Held metformin in hospital, can resume at home - blood sugars have been stable  CBG (last 3)   Recent Labs (last 2 labs)    Recent Labs  03/16/16 0023 03/16/16 0643 03/16/16 1304  GLUCAP 196* 161* 146*     Hypokalemia: Initial troponin is 3.1, repeat 3.2, after IV KCl. - Repleted to normal prior to discharge  Left ovarian lesion: Incidentally noted 3.8 cm with possible cystic component on CT abdomen andpelvis.  - Follow-upwith a pelvic ultrasound when acute issues resolve  HTN, History of CVA: Noted  back in 06/2013 with without residual deficit - restart plavix 12/10 (ok with surgery) - Norvasc restarted 12/10  Hyponatremia: Initial sodium 129,  resolved now 138. Likely  hypovolemic hyponatremia due to the nausea vomiting, reduce by mouth intake   Anemia: hemoglobin 11.8 on admission. Patient with vitals stable at this timeand no reported bleeding. - Continue to monitor  Code Status : Full  Disposition Plan  : Home   Consults  : Gen. Surgery, IR  DVT Prophylaxis  :  SCDs   Discharge Diagnoses:  Principal Problem:   Acute cholecystitis Active Problems:   Diabetes with retinopathy (HCC)   History of CVA (cerebrovascular accident) (multiple, recurrent)   Left ovarian enlargement   Leukocytosis  Discharge Instructions  Discharge Instructions    Diet - low sodium heart healthy    Complete by:  As directed        Medication List    STOP taking these medications   ALPRAZolam 0.5 MG tablet Commonly known as:  XANAX   oxyCODONE 5 MG immediate release tablet Commonly known as:  ROXICODONE     TAKE these medications   amLODipine 5 MG tablet Commonly known as:  NORVASC Take 5 mg by mouth daily   aspirin EC 81 MG tablet Take 1 tablet (81 mg total) by mouth daily.   atorvastatin 20 MG tablet Commonly known as:  LIPITOR Take 1 tablet (20 mg total) by mouth daily. What changed:  when to take this   CENTRUM tablet Take 1 tablet by mouth daily.   cholecalciferol 1000 units tablet Commonly known as:  VITAMIN D Take 1,000 Units by mouth 2 (two) times daily.   clopidogrel 75 MG tablet Commonly known as:  PLAVIX Take 1 tablet (75 mg total) by mouth daily.   Co Q 10 100 MG Caps Take 200 mg by mouth daily.   docusate sodium 100 MG capsule Commonly known as:  COLACE Take 1 capsule (100 mg total) by mouth 2 (two) times daily.   Fish Oil 1000 MG Caps Take 1,000 mg by mouth daily.   Ginkgo Biloba 500 MG Caps Take 500 mg by mouth daily.   lisinopril 10  MG tablet Commonly known as:  PRINIVIL,ZESTRIL Take 1 tablet (10 mg total) by mouth daily.   metFORMIN 500 MG tablet Commonly known as:  GLUCOPHAGE Take 1,000 mg by mouth 2 (two) times daily.   ondansetron 4 MG disintegrating tablet Commonly known as:  ZOFRAN ODT 4mg  ODT q4 hours prn nausea/vomit What changed:  how much to take  how to take this  when to take this  reasons to take this  additional instructions   sulfamethoxazole-trimethoprim 800-160 MG tablet Commonly known as:  BACTRIM DS,SEPTRA DS Take 1 tablet by mouth every 12 (twelve) hours.   venlafaxine XR 37.5 MG 24 hr capsule Commonly known as:  EFFEXOR XR Take 1 capsule (37.5 mg total) by mouth daily with breakfast.      Follow-up Information    Valarie Merino, MD. Go on 04/23/2016.   Specialty:  General Surgery Why:  at 9:00 AM for follow up from your recent hospitalization. Please arrive 30 minutes early (8:30 AM)  to get checked in and fill out any necessary paperwork. Contact information: [address] [place] [address] [phone]        HASSELL III, DAYNE DANIEL, MD. Schedule an appointment as soon as possible for a visit in 6 week(s).   Specialty:  Interventional Radiology Contact information: [address] [place] [address] [phone]          Allergies  Allergen Reactions  . Penicillins Other (See Comments)    Driving couldn't see and head felt heavy  Has patient had a PCN reaction causing immediate rash, facial/tongue/throat swelling, SOB or lightheadedness with hypotension: No Has patient had a PCN reaction causing severe rash involving mucus membranes or skin necrosis: No Has patient had a PCN reaction that required hospitalization: No Has patient had a PCN reaction occurring within the last 10 years: No If all of the above answers are "NO", then may proceed with Cephalosporin use.   . Other Other (See Comments)    Allergic to beef and eggplant    Procedures/Studies: Dg Chest 2 View  Result Date: 03/08/2016 CLINICAL DATA:  Vomiting for 2 days. Central chest pain. Hypertension. EXAM: CHEST  2 VIEW COMPARISON:  None. FINDINGS: The heart size and mediastinal contours are within normal limits. Both lungs are clear. The visualized skeletal structures are unremarkable. Loop recorder. IMPRESSION: No active cardiopulmonary disease. Electronically Signed   By: Elsie StainJohn T Curnes M.D.   On: 03/08/2016 09:57   Ct Abdomen Pelvis W Contrast  Result Date: 03/12/2016 CLINICAL DATA:  Right lower quadrant pain. Vomiting, decreased p.o. intake, right-sided back pain and nausea. EXAM: CT ABDOMEN AND PELVIS WITH CONTRAST TECHNIQUE: Multidetector CT imaging of the abdomen and pelvis was performed using the standard protocol following bolus administration of intravenous contrast. CONTRAST:  100mL ISOVUE-300 IOPAMIDOL (ISOVUE-300) INJECTION 61% COMPARISON:  None. FINDINGS: Lower chest: Right greater than left lower lobe atelectasis. Calcified right lower lobe granuloma. Trace right pleural thickening. Hepatobiliary: Gallbladder is distended containing calcified gallstone with marked pericholecystic inflammation. There is gallbladder wall enhancement and thickening, pericholecystic soft tissue stranding and small amount of fluid. No biliary dilatation or calcified choledocholithiasis. There is a 2.1 cm hypodense lesion in the dome of the right lobe of the liver with peripheral contrast puddling consistent with hemangioma. Pancreas: Focal calcification in the pancreatic tail. No ductal dilatation or inflammation. Spleen: Normal in size without focal abnormality. Adrenals/Urinary Tract: No adrenal nodule. No hydronephrosis or perinephric edema. Homogeneous enhancement. Tiny cortical hypodensities in the left kidney, too small to characterize but likely cysts. Urinary bladder is distended without wall thickening. Stomach/Bowel: Para pyloric and duodenal wall thickening likely  reactive related to adjacent gallbladder inflammation. There is a duodenum diverticulum, no para diverticular inflammation. No small bowel dilatation. No bowel obstruction. Normal appendix. Moderate stool in the right colon, with small volume of stool distally. Vascular/Lymphatic: Abdominal aortic atherosclerosis without aneurysm. Small retroperitoneal lymph nodes are likely reactive. Reproductive: Calcified uterine fibroid, uterus is heterogeneous. The left ovary is prominent size for age measuring at least 3.8 cm with probable small complex cyst. Other: No free air.  No intra- abdominal abscess. Musculoskeletal: Degenerative disc disease at L2-L3. There are no acute or suspicious osseous abnormalities. IMPRESSION: 1. Acute cholecystitis.  No biliary dilatation. 2. Insula finding of left ovarian prominence for age post menopausal status during 3.8 cm with possible cystic component. Recommend nonemergent pelvic ultrasound after coalescence of acute illness. 3. Abdominal atherosclerosis, no aneurysm.  Uterine fibroids. Electronically Signed  By: Rubye Oaks M.D.   On: 03/12/2016 22:27   Ir Perc Cholecystostomy  Result Date: 03/13/2016 INDICATION: Acute cholecystitis EXAM: ULTRASOUND AND FLUOROSCOPIC-GUIDED CHOLECYSTOSTOMY TUBE PLACEMENT COMPARISON:  CT abdomen and pelvis - 03/12/2016 MEDICATIONS: The patient is currently admitted to the hospital and on intravenous antibiotics. Antibiotics were administered within an appropriate time frame prior to skin puncture. ANESTHESIA/SEDATION: Moderate (conscious) sedation was employed during this procedure. A total of Versed 2 mg and Fentanyl 50 mcg was administered intravenously. Moderate Sedation Time: 10 minutes. The patient's level of consciousness and vital signs were monitored continuously by radiology nursing throughout the procedure under my direct supervision. CONTRAST:  10mL ISOVUE-300 IOPAMIDOL (ISOVUE-300) INJECTION 61% - administered into the gallbladder  fossa. FLUOROSCOPY TIME:  36 seconds (7 mGy) COMPLICATIONS: None immediate. PROCEDURE: Informed written consent was obtained from the patient after a discussion of the risks, benefits and alternatives to treatment. Questions regarding the procedure were encouraged and answered. A timeout was performed prior to the initiation of the procedure. The right upper abdominal quadrant was prepped and draped in the usual sterile fashion, and a sterile drape was applied covering the operative field. Maximum barrier sterile technique with sterile gowns and gloves were used for the procedure. A timeout was performed prior to the initiation of the procedure. Local anesthesia was provided with 1% lidocaine with epinephrine. Ultrasound scanning of the right upper quadrant demonstrates a markedly dilated gallbladder. Of note, the patient reported pain with ultrasound imaging over the gallbladder. Utilizing a transhepatic approach, a 22 gauge needle was advanced into the gallbladder under direct ultrasound guidance. An ultrasound image was saved for documentation purposes. Appropriate intraluminal puncture was confirmed with the efflux of bile and advancement of an 0.018 wire into the gallbladder lumen. The needle was exchanged for an Accustick set. A small amount of contrast was injected to confirm appropriate intraluminal positioning. Over a Benson wire, a 10.2-French Cook cholecystomy tube was advanced into the gallbladder fossa, coiled and locked. A small amount of purulent appearing bile was aspirated, capped and sent to the laboratory for analysis. A small amount of contrast was injected as several post procedural spot radiographic images were obtained in various obliquities. The catheter was secured to the skin with suture, connected to a drainage bag and a dressing was placed. The patient tolerated the procedure well without immediate post procedural complication. IMPRESSION: Successful ultrasound and fluoroscopic guided  placement of a 10.2 French cholecystostomy tube. A small sample of purulent appearing bile was capped and sent to the laboratory for analysis. Electronically Signed   By: [name] M.D.   On: 03/13/2016 12:46     Subjective: Pt did have a bowel movement after her enema today.  Tolerated diet yesterday and today.   Discharge Exam: Vitals:   03/16/16 1508 03/16/16 2010  BP: (!) 161/74 (!) 148/66  Pulse: 65 65  Resp: 18 18  Temp: 97.3 F (36.3 C) 98.2 F (36.8 C)   Vitals:   03/15/16 2040 03/16/16 0505 03/16/16 1508 03/16/16 2010  BP: (!) 147/68 (!) 147/73 (!) 161/74 (!) 148/66  Pulse: 64 69 65 65  Resp: 19 18 18 18   Temp: 97.6 F (36.4 C) 98.9 F (37.2 C) 97.3 F (36.3 C) 98.2 F (36.8 C)  TempSrc: Oral Oral Oral Oral  SpO2: 95% 98% 98% 100%  Weight:   56 kg (123 lb 7.3 oz)   Height:   5\' 2"  (1.575 m)    Gen:- Awake Alert, Oriented. Appears ill. NAD.  HEENT:- [place].AT, No sclera icterus Neck-Supple Neck Lungs-  CTAB, no wheezes no crackles. CV- S1, S2 normal, regular Abd-  +ve B.Sounds, Abd Soft,mild generalized tenderness tenderness, biliary drain in place with good output  Extremity/Skin:- No  edema,  warm and well perfused, moving all extremities   The results of significant diagnostics from this hospitalization (including imaging, microbiology, ancillary and laboratory) are listed below for reference.     Microbiology: Recent Results (from the past 240 hour(s))  Aerobic/Anaerobic Culture (surgical/deep wound)     Status: None (Preliminary result)   Collection Time: 03/13/16 12:48 PM  Result Value Ref Range Status   Specimen Description DRAINAGE GALLBLADER  Final   Special Requests Normal  Final   Gram Stain   Final    ABUNDANT WBC PRESENT, PREDOMINANTLY PMN FEW GRAM NEGATIVE RODS [facility]    Culture   Final    MODERATE ESCHERICHIA COLI Confirmed Extended Spectrum Beta-Lactamase Producer (ESBL) NO ANAEROBES ISOLATED; CULTURE IN  PROGRESS FOR 5 DAYS    Report Status PENDING  Incomplete   Organism ID, Bacteria ESCHERICHIA COLI  Final      Susceptibility   Escherichia coli - MIC*    AMPICILLIN >=32 RESISTANT Resistant     CEFAZOLIN >=64 RESISTANT Resistant     CEFEPIME <=1 RESISTANT Resistant     CEFTAZIDIME <=1 RESISTANT Resistant     CEFTRIAXONE >=64 RESISTANT Resistant     CIPROFLOXACIN >=4 RESISTANT Resistant     GENTAMICIN <=1 SENSITIVE Sensitive     IMIPENEM <=0.25 SENSITIVE Sensitive     TRIMETH/SULFA <=20 SENSITIVE Sensitive     AMPICILLIN/SULBACTAM 4 SENSITIVE Sensitive     PIP/TAZO <=4 SENSITIVE Sensitive     Extended ESBL POSITIVE Resistant     * MODERATE ESCHERICHIA COLI     Labs: BNP (last 3 results) No results for input(s): BNP in the last 8760 hours. Basic Metabolic Panel:  Recent Labs Lab 03/13/16 0536 03/13/16 0540 03/14/16 0533 03/15/16 0545 03/16/16 0532 03/17/16 0532  NA  --  138 138 137 133* 135  K  --  3.2* 3.1* 3.7 3.6 3.8  CL  --  105 105 103 102 99*  CO2  --  27 27 26 25 29   GLUCOSE  --  171* 152* 119* 162* 139*  BUN  --  9 8 8 8 6   CREATININE  --  0.67 0.67 0.67 0.53 0.72  CALCIUM  --  7.5* 7.5* 7.6* 7.7* 8.1*  MG 2.0  --  2.0 2.0  --   --    Liver Function Tests:  Recent Labs Lab 03/12/16 2247 03/13/16 0540 03/14/16 0533  AST 20 19 11*  ALT 36 33 25  ALKPHOS 103 101 94  BILITOT 1.0 0.7 0.6  PROT 5.5* 5.6* 5.2*  ALBUMIN 2.3* 2.5* 2.4*   No results for input(s): LIPASE, AMYLASE in the last 168 hours. No results for input(s): AMMONIA in the last 168 hours. CBC:  Recent Labs Lab 03/12/16 1700 03/13/16 0540 03/14/16 0533 03/15/16 0545  WBC 12.9* 9.9 5.4 7.3  NEUTROABS  --   --   --  4.9  HGB 11.8* 10.2* 10.0* 10.1*  HCT 35.2* 30.3* 29.8* 29.7*  MCV 83.8 84.6 85.4 82.5  PLT 186 159 165 205   Cardiac Enzymes:  Recent Labs Lab 03/12/16 1950  TROPONINI <0.03   BNP: Invalid input(s): POCBNP CBG:  Recent Labs Lab 03/15/16 1803 03/16/16 0023  03/16/16 0643 03/16/16 1304 03/17/16 0741  GLUCAP 187*  196* 161* 146* 150*   D-Dimer No results for input(s): DDIMER in the last 72 hours. Hgb A1c No results for input(s): HGBA1C in the last 72 hours. Lipid Profile No results for input(s): CHOL, HDL, LDLCALC, TRIG, CHOLHDL, LDLDIRECT in the last 72 hours. Thyroid function studies No results for input(s): TSH, T4TOTAL, T3FREE, THYROIDAB in the last 72 hours.  Invalid input(s): FREET3 Anemia work up No results for input(s): VITAMINB12, FOLATE, FERRITIN, TIBC, IRON, RETICCTPCT in the last 72 hours. Urinalysis    Component Value Date/Time   COLORURINE AMBER (A) 03/12/2016 1621   APPEARANCEUR HAZY (A) 03/12/2016 1621   LABSPEC 1.020 03/12/2016 1621   PHURINE 6.0 03/12/2016 1621   GLUCOSEU 150 (A) 03/12/2016 1621   HGBUR SMALL (A) 03/12/2016 1621   BILIRUBINUR NEGATIVE 03/12/2016 1621   KETONESUR 5 (A) 03/12/2016 1621   PROTEINUR 30 (A) 03/12/2016 1621   NITRITE NEGATIVE 03/12/2016 1621   LEUKOCYTESUR SMALL (A) 03/12/2016 1621   Sepsis Labs Invalid input(s): PROCALCITONIN,  WBC,  LACTICIDVEN Microbiology Recent Results (from the past 240 hour(s))  Aerobic/Anaerobic Culture (surgical/deep wound)     Status: None (Preliminary result)   Collection Time: 03/13/16 12:48 PM  Result Value Ref Range Status   Specimen Description DRAINAGE GALLBLADER  Final   Special Requests Normal  Final   Gram Stain   Final    ABUNDANT WBC PRESENT, PREDOMINANTLY PMN FEW GRAM NEGATIVE RODS Performed at [facility]    Culture   Final    MODERATE ESCHERICHIA COLI Confirmed Extended Spectrum Beta-Lactamase Producer (ESBL) NO ANAEROBES ISOLATED; CULTURE IN PROGRESS FOR 5 DAYS    Report Status PENDING  Incomplete   Organism ID, Bacteria ESCHERICHIA COLI  Final      Susceptibility   Escherichia coli - MIC*    AMPICILLIN >=32 RESISTANT Resistant     CEFAZOLIN >=64 RESISTANT Resistant     CEFEPIME <=1 RESISTANT Resistant     CEFTAZIDIME  <=1 RESISTANT Resistant     CEFTRIAXONE >=64 RESISTANT Resistant     CIPROFLOXACIN >=4 RESISTANT Resistant     GENTAMICIN <=1 SENSITIVE Sensitive     IMIPENEM <=0.25 SENSITIVE Sensitive     TRIMETH/SULFA <=20 SENSITIVE Sensitive     AMPICILLIN/SULBACTAM 4 SENSITIVE Sensitive     PIP/TAZO <=4 SENSITIVE Sensitive     Extended ESBL POSITIVE Resistant     * MODERATE ESCHERICHIA COLI   Time coordinating discharge: 33 minutes  SIGNED:  Standley Dakins, MD  [facility] 03/17/2016, 1:41 PM Pager   If 7PM-7AM, please contact night-coverage [email or web address] Password [email or web address]

## 2016-03-17 NOTE — Progress Notes (Signed)
Date: March 17, 2016 Discharge orders checked for needs. [facility] notified of need for  rn for Drain care and PT Marcelle Smilinghonda Davis, RN, BSN, [place]   [phone]

## 2016-03-17 NOTE — Progress Notes (Signed)
Went over d/c instructions with patient and son.  Went over drain care with teach back.  Patient demonstrated understanding.  Left hospital with son.

## 2016-03-18 ENCOUNTER — Other Ambulatory Visit: Payer: Self-pay | Admitting: General Surgery

## 2016-03-18 DIAGNOSIS — K810 Acute cholecystitis: Secondary | ICD-10-CM

## 2016-03-18 LAB — AEROBIC/ANAEROBIC CULTURE (SURGICAL/DEEP WOUND): SPECIAL REQUESTS: NORMAL

## 2016-03-26 ENCOUNTER — Ambulatory Visit: Payer: Self-pay | Admitting: Neurology

## 2016-04-13 ENCOUNTER — Ambulatory Visit: Payer: BC Managed Care – PPO | Admitting: Neurology

## 2016-04-22 ENCOUNTER — Other Ambulatory Visit: Payer: BC Managed Care – PPO

## 2016-04-23 ENCOUNTER — Other Ambulatory Visit: Payer: BC Managed Care – PPO

## 2016-04-28 ENCOUNTER — Ambulatory Visit
Admission: RE | Admit: 2016-04-28 | Discharge: 2016-04-28 | Disposition: A | Discharge status: Home / Self Care | Payer: BC Managed Care – PPO | Source: Ambulatory Visit | Attending: General Surgery | Admitting: General Surgery

## 2016-04-28 DIAGNOSIS — K810 Acute cholecystitis: Secondary | ICD-10-CM

## 2016-04-28 HISTORY — PX: IR GENERIC HISTORICAL: IMG1180011

## 2016-04-28 NOTE — Progress Notes (Signed)
Chief Complaint: Patient was seen in consultation today for follow up perc chole tube.  Referring Physician(s): Dr. Wenda LowMatt Martin  History of Present Illness: Marie [name] is a 61 y.o. female who underwent perc cholecystostomy drain placement on 12/8 for acute calculous cholecystitis. She has done well the tube. She reports continued drainage of bile daily. No N/V, she is eating well. No fevers, chills, abd pain. She is here today for follow up and cholangiogram  Past Medical History:  Diagnosis Date  . CVA (cerebral vascular accident) (HCC)   . Depression   . Diabetes (HCC)   . Hyperlipemia   . Hypertension   . Migraines     Past Surgical History:  Procedure Laterality Date  . CESAREAN SECTION    . IR GENERIC HISTORICAL  03/13/2016   IR PERC CHOLECYSTOSTOMY 03/13/2016 Simonne ComeJohn Watts, MD [facility]  . IR GENERIC HISTORICAL  04/28/2016   IR RADIOLOGIST EVAL & MGMT 04/28/2016 Malachy MoanHeath McCullough, MD [facility]  . LOOP RECORDER IMPLANT N/A 11/29/2013   Procedure: LOOP RECORDER IMPLANT;  Surgeon: Marinus MawGregg W Taylor, MD;  [facility];  Service: Cardiovascular;  Laterality: N/A;  . TEE WITHOUT CARDIOVERSION N/A 08/24/2013   Procedure: TRANSESOPHAGEAL ECHOCARDIOGRAM (TEE);  Surgeon: Thurmon FairMihai Croitoru, MD;  [facility];  Service: Cardiovascular;  Laterality: N/A;    Allergies: Penicillins and Other  Medications: Prior to Admission medications   Medication Sig Start Date End Date Taking? Authorizing Provider  amLODipine (NORVASC) 5 MG tablet Take 5 mg by mouth daily 12/20/15   Historical Provider, MD  aspirin EC 81 MG tablet Take 1 tablet (81 mg total) by mouth daily. 06/07/13   Levert FeinsteinYijun Yan, MD  atorvastatin (LIPITOR) 20 MG tablet Take 1 tablet (20 mg total) by mouth daily. Patient taking differently: Take 20 mg by mouth 2 (two) times daily.  01/23/15   Levert FeinsteinYijun Yan, MD  cholecalciferol (VITAMIN D) 1000 UNITS tablet Take 1,000 Units by mouth 2 (two) times daily.      Historical Provider, MD  clopidogrel (PLAVIX) 75 MG tablet Take 1 tablet (75 mg total) by mouth daily. 07/10/15   Levert FeinsteinYijun Yan, MD  Coenzyme Q10 (CO Q 10) 100 MG CAPS Take 200 mg by mouth daily.    Historical Provider, MD  docusate sodium (COLACE) 100 MG capsule Take 1 capsule (100 mg total) by mouth 2 (two) times daily. 03/17/16   Clanford Cyndie MullL Johnson, MD  Ginkgo Biloba 500 MG CAPS Take 500 mg by mouth daily.     Historical Provider, MD  lisinopril (PRINIVIL,ZESTRIL) 10 MG tablet Take 1 tablet (10 mg total) by mouth daily. 01/23/15   Levert FeinsteinYijun Yan, MD  metFORMIN (GLUCOPHAGE) 500 MG tablet Take 1,000 mg by mouth 2 (two) times daily.  01/15/15   Historical Provider, MD  Multiple Vitamins-Minerals (CENTRUM) tablet Take 1 tablet by mouth daily.    Historical Provider, MD  Omega-3 Fatty Acids (FISH OIL) 1000 MG CAPS Take 1,000 mg by mouth daily.     Historical Provider, MD  ondansetron (ZOFRAN ODT) 4 MG disintegrating tablet 4mg  ODT q4 hours prn nausea/vomit Patient taking differently: Take 4 mg by mouth every 4 (four) hours as needed for nausea or vomiting. 4mg  ODT q4 hours prn nausea/vomit 03/08/16   Melene Planan Floyd, DO  venlafaxine XR (EFFEXOR XR) 37.5 MG 24 hr capsule Take 1 capsule (37.5 mg total) by mouth daily with breakfast. Patient not taking: Reported on 03/12/2016 11/25/15   Levert FeinsteinYijun Yan, MD     Family  History  Problem Relation Age of Onset  . Stomach cancer Mother   . High blood pressure Mother   . Diabetes Maternal Grandfather   . High blood pressure Maternal Grandfather     Social History   Social History  . Marital status: Married    Spouse name: N/A  . Number of children: 3  . Years of education: MBA   Occupational History  .  [employer]   Social History Main Topics  . Smoking status: Never Smoker  . Smokeless tobacco: Never Used  . Alcohol use No     Comment: Social  . Drug use: No  . Sexual activity: Not on file   Other Topics Concern  . Not on file   Social  History Narrative   Patient lives at home with [name].   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             Review of Systems: A 12 point ROS discussed and pertinent positives are indicated in the HPI above.  All other systems are negative.  Review of Systems  Vital Signs: BP (!) 161/88 (BP Location: Right Arm)   Pulse (!) 106   Temp 97.7 F (36.5 C) (Oral)   SpO2 98%   Physical Exam  Constitutional: She is oriented to person, place, and time. She appears well-developed and well-nourished. No distress.  HENT:  Head: Normocephalic.  Mouth/Throat: Oropharynx is clear and moist.  Cardiovascular: Normal rate, regular rhythm and normal heart sounds.   Pulmonary/Chest: Effort normal and breath sounds normal. No respiratory distress.  Abdominal: Soft. There is no tenderness.  RUQ drain intact. Thin yellow bile output in bag.  Neurological: She is alert and oriented to person, place, and time.     Imaging: Drain injection under fluoro demonstrates filling of the gallbladder with visible cholelithiasis. The cystic duct is patent and there is prompt filing and emptying of the common bile duct with contrast ultimately filling the duodenum.  Labs:  CBC:  Recent Labs  03/12/16 1700 03/13/16 0540 03/14/16 0533 03/15/16 0545  WBC 12.9* 9.9 5.4 7.3  HGB 11.8* 10.2* 10.0* 10.1*  HCT 35.2* 30.3* 29.8* 29.7*  PLT 186 159 165 205    COAGS:  Recent Labs  03/13/16 0834  INR 1.10    BMP:  Recent Labs  03/14/16 0533 03/15/16 0545 03/16/16 0532 03/17/16 0532  NA 138 137 133* 135  K 3.1* 3.7 3.6 3.8  CL 105 103 102 99*  CO2 27 26 25 29   GLUCOSE 152* 119* 162* 139*  BUN 8 8 8 6   CALCIUM 7.5* 7.6* 7.7* 8.1*  CREATININE 0.67 0.67 0.53 0.72  GFRNONAA >60 >60 >60 >60  GFRAA >60 >60 >60 >60    LIVER FUNCTION TESTS:  Recent Labs  03/08/16 1005 03/12/16 2247 03/13/16 0540  03/14/16 0533  BILITOT 1.3* 1.0 0.7 0.6  AST 21 20 19  11*  ALT 27 36 33 25  ALKPHOS 78 103 101 94  PROT 8.1 5.5* 5.6* 5.2*  ALBUMIN 4.6 2.3* 2.5* 2.4*    TUMOR MARKERS: No results for input(s): AFPTM, CEA, CA199, CHROMGRNA in the last 8760 hours.  Assessment and Plan: Acute calculus cholecystitis s/p perc chole drain Injection confirms patency of cystic duct and CBD. Contacted Dr. Daphine Deutscher. Agree to try capping the drain and he will see her in follow  up to discuss surgery.  Thank you for this interesting consult.  I greatly enjoyed meeting Deleah Tison and look forward to participating in their care.  A copy of this report was sent to the requesting provider on this date.  Electronically Signed: Brayton El 04/28/2016, 2:04 PM   I spent a total of 20 minutes in face to face in clinical consultation, greater than 50% of which was counseling/coordinating care for perc chole drain injection

## 2016-05-14 ENCOUNTER — Ambulatory Visit: Payer: Self-pay | Admitting: Neurology

## 2016-05-20 NOTE — Progress Notes (Signed)
Patient has pre-op appt on 05-21-16 and surgery on 05-25-16 with Dr Daphine DeutscherMartin. Please place orders in epic. thanks

## 2016-05-21 ENCOUNTER — Encounter (HOSPITAL_COMMUNITY): Payer: Self-pay | Admitting: Emergency Medicine

## 2016-05-21 ENCOUNTER — Encounter (HOSPITAL_COMMUNITY)
Admission: RE | Admit: 2016-05-21 | Discharge: 2016-05-21 | Disposition: A | Discharge status: Home / Self Care | Payer: BC Managed Care – PPO | Source: Ambulatory Visit | Attending: Surgery | Admitting: Surgery

## 2016-05-21 DIAGNOSIS — E119 Type 2 diabetes mellitus without complications: Secondary | ICD-10-CM | POA: Insufficient documentation

## 2016-05-21 DIAGNOSIS — Z01818 Encounter for other preprocedural examination: Secondary | ICD-10-CM | POA: Insufficient documentation

## 2016-05-21 HISTORY — DX: Calculus of gallbladder without cholecystitis without obstruction: K80.20

## 2016-05-21 LAB — CBC
HEMATOCRIT: 39.7 % (ref 36.0–46.0)
HEMOGLOBIN: 12.9 g/dL (ref 12.0–15.0)
MCH: 27.7 pg (ref 26.0–34.0)
MCHC: 32.5 g/dL (ref 30.0–36.0)
MCV: 85.2 fL (ref 78.0–100.0)
PLATELETS: 209 10*3/uL (ref 150–400)
RBC: 4.66 MIL/uL (ref 3.87–5.11)
RDW: 13.7 % (ref 11.5–15.5)
WBC: 7.7 10*3/uL (ref 4.0–10.5)

## 2016-05-21 LAB — COMPREHENSIVE METABOLIC PANEL
ALK PHOS: 77 U/L (ref 38–126)
ALT: 25 U/L (ref 14–54)
AST: 24 U/L (ref 15–41)
Albumin: 4.2 g/dL (ref 3.5–5.0)
Anion gap: 11 (ref 5–15)
BUN: 18 mg/dL (ref 6–20)
CALCIUM: 9.7 mg/dL (ref 8.9–10.3)
CHLORIDE: 108 mmol/L (ref 101–111)
CO2: 26 mmol/L (ref 22–32)
CREATININE: 0.73 mg/dL (ref 0.44–1.00)
GFR calc Af Amer: >60 mL/min (ref >60)
Glucose, Bld: 196 mg/dL — ABNORMAL HIGH (ref 65–99)
Potassium: 4.8 mmol/L (ref 3.5–5.1)
Sodium: 145 mmol/L (ref 135–145)
Total Bilirubin: 0.5 mg/dL (ref 0.3–1.2)
Total Protein: 7.6 g/dL (ref 6.5–8.1)

## 2016-05-21 LAB — GLUCOSE, CAPILLARY: Glucose-Capillary: 230 mg/dL — ABNORMAL HIGH (ref 65–99)

## 2016-05-21 NOTE — Patient Instructions (Signed)
Marie [name]  05/21/2016   Your procedure is scheduled on: 05-25-16  Report to [facility] Main  Entrance take [facility]  elevators to 3rd floor to  Short Stay Center at 930AM.  Call this number if you have problems the morning of surgery [phone]   Remember: ONLY 1 PERSON MAY GO WITH YOU TO SHORT STAY TO GET  READY MORNING OF YOUR SURGERY.  Do not eat food or drink liquids :After Midnight.     Take these medicines the morning of surgery with A SIP OF WATER: NONE DO NOT TAKE ANY DIABETIC MEDICATIONS DAY OF YOUR SURGERY                               You may not have any metal on your body including hair pins and              piercings  Do not wear jewelry, make-up, lotions, powders or perfumes, deodorant             Do not wear nail polish.  Do not shave  48 hours prior to surgery.              Men may shave face and neck.   Do not bring valuables to the hospital. [facility] IS NOT             RESPONSIBLE   FOR VALUABLES.  Contacts, dentures or bridgework may not be worn into surgery.  Leave suitcase in the car. After surgery it may be brought to your room.              Please read over the following fact sheets you were given: _____________________________________________________________________             How to Manage Your Diabetes Before and After Surgery  Why is it important to control my blood sugar before and after surgery? . Improving blood sugar levels before and after surgery helps healing and can limit problems. . A way of improving blood sugar control is eating a healthy diet by: o  Eating less sugar and carbohydrates o  Increasing activity/exercise o  Talking with your doctor about reaching your blood sugar goals . High blood sugars (greater than 180 mg/dL) can raise your risk of infections and slow your recovery, so you will need to focus on controlling your diabetes during the weeks before surgery. . Make sure that the doctor who takes  care of your diabetes knows about your planned surgery including the date and location.  How do I manage my blood sugar before surgery? . Check your blood sugar at least 4 times a day, starting 2 days before surgery, to make sure that the level is not too high or low. o Check your blood sugar the morning of your surgery when you wake up and every 2 hours until you get to the Short Stay unit. . If your blood sugar is less than 70 mg/dL, you will need to treat for low blood sugar: o Do not take insulin. o Treat a low blood sugar (less than 70 mg/dL) with  cup of clear juice (cranberry or apple), 4 glucose tablets, OR glucose gel. o Recheck blood sugar in 15 minutes after treatment (to make sure it is greater than 70 mg/dL). If your blood sugar is not greater than 70 mg/dL on recheck,  call [phone] for further instructions. . Report your blood sugar to the short stay nurse when you get to Short Stay.  . If you are admitted to the hospital after surgery: o Your blood sugar will be checked by the staff and you will probably be given insulin after surgery (instead of oral diabetes medicines) to make sure you have good blood sugar levels. o The goal for blood sugar control after surgery is 80-180 mg/dL.   WHAT DO I DO ABOUT MY DIABETES MEDICATION?  [name]. Do not take oral diabetes medicines (pills) the morning of surgery.  . THE NIGHT BEFORE SURGERY, take your Metformin as usual.      . THE MORNING OF SURGERY, DO NOT TAKE YOUR METFORMIN    Reviewed and Endorsed by [facility] Patient Education Committee, August 2015  [facility] - Preparing for Surgery Before surgery, you can play an important role.  Because skin is not sterile, your skin needs to be as free of germs as possible.  You can reduce the number of germs on your skin by washing with CHG (chlorahexidine gluconate) soap before surgery.  CHG is an antiseptic cleaner which kills germs and bonds with the skin to continue killing germs  even after washing. Please DO NOT use if you have an allergy to CHG or antibacterial soaps.  If your skin becomes reddened/irritated stop using the CHG and inform your nurse when you arrive at Short Stay. Do not shave (including legs and underarms) for at least 48 hours prior to the first CHG shower.  You may shave your face/neck. Please follow these instructions carefully:  1.  Shower with CHG Soap the night before surgery and the  morning of Surgery.  2.  If you choose to wash your hair, wash your hair first as usual with your  normal  shampoo.  3.  After you shampoo, rinse your hair and body thoroughly to remove the  shampoo.                           4.  Use CHG as you would any other liquid soap.  You can apply chg directly  to the skin and wash                       Gently with a scrungie or clean washcloth.  5.  Apply the CHG Soap to your body ONLY FROM THE NECK DOWN.   Do not use on face/ open                           Wound or open sores. Avoid contact with eyes, ears mouth and genitals (private parts).                       Wash face,  Genitals (private parts) with your normal soap.             6.  Wash thoroughly, paying special attention to the area where your surgery  will be performed.  7.  Thoroughly rinse your body with warm water from the neck down.  8.  DO NOT shower/wash with your normal soap after using and rinsing off  the CHG Soap.                9.  Pat yourself dry with a clean towel.  10.  Wear clean pajamas.            11.  Place clean sheets on your bed the night of your first shower and do not  sleep with pets. Day of Surgery : Do not apply any lotions/deodorants the morning of surgery.  Please wear clean clothes to the hospital/surgery center.  FAILURE TO FOLLOW THESE INSTRUCTIONS MAY RESULT IN THE CANCELLATION OF YOUR SURGERY PATIENT SIGNATURE_________________________________  NURSE  SIGNATURE__________________________________  ________________________________________________________________________

## 2016-05-22 LAB — HEMOGLOBIN A1C
HEMOGLOBIN A1C: 8.9 % — AB (ref 4.8–5.6)
Mean Plasma Glucose: 209 mg/dL

## 2016-05-22 NOTE — Progress Notes (Signed)
HEMAGLOBIN a1C RESULTS ROUTED TO DR Javier GlazierMARTIN INBASKET AND SPOKE WITH WENDT [facility], SHE WILL PAGE DR Daphine DeutscherMARTIN AND MAKE HIM AWARE OF RESULTS

## 2016-05-22 NOTE — Progress Notes (Signed)
LOV neuro Dr Terrace ArabiaYAn 01-27-16 epic CXR 03-08-16 epic EKG 01-27-16 epic

## 2016-05-25 ENCOUNTER — Ambulatory Visit (HOSPITAL_COMMUNITY): Payer: BC Managed Care – PPO | Admitting: Anesthesiology

## 2016-05-25 ENCOUNTER — Encounter (HOSPITAL_COMMUNITY)
Admission: RE | Disposition: A | Discharge status: Home / Self Care | Payer: Self-pay | Source: Ambulatory Visit | Attending: Surgery

## 2016-05-25 ENCOUNTER — Encounter (HOSPITAL_COMMUNITY): Payer: Self-pay | Admitting: *Deleted

## 2016-05-25 ENCOUNTER — Ambulatory Visit: Payer: Self-pay | Admitting: Surgery

## 2016-05-25 ENCOUNTER — Observation Stay (HOSPITAL_COMMUNITY)
Admission: RE | Admit: 2016-05-25 | Discharge: 2016-05-26 | Disposition: A | Discharge status: Home / Self Care | Payer: BC Managed Care – PPO | Source: Ambulatory Visit | Attending: Surgery | Admitting: Surgery

## 2016-05-25 ENCOUNTER — Ambulatory Visit (HOSPITAL_COMMUNITY): Payer: BC Managed Care – PPO

## 2016-05-25 DIAGNOSIS — E1142 Type 2 diabetes mellitus with diabetic polyneuropathy: Secondary | ICD-10-CM | POA: Insufficient documentation

## 2016-05-25 DIAGNOSIS — Z8673 Personal history of transient ischemic attack (TIA), and cerebral infarction without residual deficits: Secondary | ICD-10-CM | POA: Diagnosis not present

## 2016-05-25 DIAGNOSIS — F329 Major depressive disorder, single episode, unspecified: Secondary | ICD-10-CM | POA: Diagnosis not present

## 2016-05-25 DIAGNOSIS — K8010 Calculus of gallbladder with chronic cholecystitis without obstruction: Principal | ICD-10-CM | POA: Insufficient documentation

## 2016-05-25 DIAGNOSIS — I672 Cerebral atherosclerosis: Secondary | ICD-10-CM | POA: Insufficient documentation

## 2016-05-25 DIAGNOSIS — I119 Hypertensive heart disease without heart failure: Secondary | ICD-10-CM | POA: Insufficient documentation

## 2016-05-25 DIAGNOSIS — Z7984 Long term (current) use of oral hypoglycemic drugs: Secondary | ICD-10-CM | POA: Insufficient documentation

## 2016-05-25 DIAGNOSIS — Z7982 Long term (current) use of aspirin: Secondary | ICD-10-CM | POA: Diagnosis not present

## 2016-05-25 DIAGNOSIS — K810 Acute cholecystitis: Secondary | ICD-10-CM | POA: Diagnosis present

## 2016-05-25 DIAGNOSIS — E11319 Type 2 diabetes mellitus with unspecified diabetic retinopathy without macular edema: Secondary | ICD-10-CM | POA: Diagnosis not present

## 2016-05-25 DIAGNOSIS — Z880 Allergy status to penicillin: Secondary | ICD-10-CM | POA: Diagnosis not present

## 2016-05-25 DIAGNOSIS — K819 Cholecystitis, unspecified: Secondary | ICD-10-CM

## 2016-05-25 DIAGNOSIS — Z9049 Acquired absence of other specified parts of digestive tract: Secondary | ICD-10-CM

## 2016-05-25 DIAGNOSIS — K811 Chronic cholecystitis: Secondary | ICD-10-CM | POA: Diagnosis present

## 2016-05-25 DIAGNOSIS — E785 Hyperlipidemia, unspecified: Secondary | ICD-10-CM | POA: Insufficient documentation

## 2016-05-25 DIAGNOSIS — K5900 Constipation, unspecified: Secondary | ICD-10-CM | POA: Diagnosis not present

## 2016-05-25 HISTORY — PX: CHOLECYSTECTOMY: SHX55

## 2016-05-25 LAB — GLUCOSE, CAPILLARY
GLUCOSE-CAPILLARY: 186 mg/dL — AB (ref 65–99)
Glucose-Capillary: 163 mg/dL — ABNORMAL HIGH (ref 65–99)
Glucose-Capillary: 218 mg/dL — ABNORMAL HIGH (ref 65–99)
Glucose-Capillary: 237 mg/dL — ABNORMAL HIGH (ref 65–99)

## 2016-05-25 LAB — CBC
HCT: 37.9 % (ref 36.0–46.0)
Hemoglobin: 12.5 g/dL (ref 12.0–15.0)
MCH: 27.6 pg (ref 26.0–34.0)
MCHC: 33 g/dL (ref 30.0–36.0)
MCV: 83.7 fL (ref 78.0–100.0)
PLATELETS: 165 10*3/uL (ref 150–400)
RBC: 4.53 MIL/uL (ref 3.87–5.11)
RDW: 13.5 % (ref 11.5–15.5)
WBC: 9.7 10*3/uL (ref 4.0–10.5)

## 2016-05-25 LAB — CREATININE, SERUM
CREATININE: 0.8 mg/dL (ref 0.44–1.00)
GFR calc Af Amer: >60 mL/min (ref >60)
GFR calc non Af Amer: >60 mL/min (ref >60)

## 2016-05-25 SURGERY — LAPAROSCOPIC CHOLECYSTECTOMY
Anesthesia: General | Site: Abdomen

## 2016-05-25 MED ORDER — BUPIVACAINE LIPOSOME 1.3 % IJ SUSP
INTRAMUSCULAR | Status: DC | PRN
  Administered 2016-05-25: 20 mL

## 2016-05-25 MED ORDER — KCL IN DEXTROSE-NACL 20-5-0.45 MEQ/L-%-% IV SOLN
INTRAVENOUS | Status: DC
  Administered 2016-05-25: 16:00:00 via INTRAVENOUS
  Filled 2016-05-25 (×2): qty 1000

## 2016-05-25 MED ORDER — HYDROMORPHONE HCL 1 MG/ML IJ SOLN
0.2500 mg | INTRAMUSCULAR | Status: DC | PRN
  Administered 2016-05-25 (×2): 0.5 mg via INTRAVENOUS

## 2016-05-25 MED ORDER — HEPARIN SODIUM (PORCINE) 5000 UNIT/ML IJ SOLN
5000.0000 [IU] | Freq: Once | INTRAMUSCULAR | Status: AC
  Administered 2016-05-25: 5000 [IU] via SUBCUTANEOUS
  Filled 2016-05-25: qty 1

## 2016-05-25 MED ORDER — SUFENTANIL CITRATE 50 MCG/ML IV SOLN
INTRAVENOUS | Status: AC
  Filled 2016-05-25: qty 1

## 2016-05-25 MED ORDER — HEPARIN SODIUM (PORCINE) 5000 UNIT/ML IJ SOLN
5000.0000 [IU] | Freq: Three times a day (TID) | INTRAMUSCULAR | Status: DC
  Administered 2016-05-25 – 2016-05-26 (×2): 5000 [IU] via SUBCUTANEOUS
  Filled 2016-05-25 (×2): qty 1

## 2016-05-25 MED ORDER — MORPHINE SULFATE (PF) 10 MG/ML IV SOLN: 1.0000 mg | INTRAVENOUS | Status: DC | PRN

## 2016-05-25 MED ORDER — KETOROLAC TROMETHAMINE 30 MG/ML IJ SOLN: 30.0000 mg | Freq: Once | INTRAMUSCULAR | Status: DC

## 2016-05-25 MED ORDER — ROCURONIUM BROMIDE 10 MG/ML (PF) SYRINGE
PREFILLED_SYRINGE | INTRAVENOUS | Status: DC | PRN
  Administered 2016-05-25: 30 mg via INTRAVENOUS
  Administered 2016-05-25: 5 mg via INTRAVENOUS

## 2016-05-25 MED ORDER — LABETALOL HCL 5 MG/ML IV SOLN
INTRAVENOUS | Status: AC
  Filled 2016-05-25: qty 4

## 2016-05-25 MED ORDER — MEPERIDINE HCL 50 MG/ML IJ SOLN: 6.2500 mg | INTRAMUSCULAR | Status: DC | PRN

## 2016-05-25 MED ORDER — MIDAZOLAM HCL 5 MG/5ML IJ SOLN
INTRAMUSCULAR | Status: DC | PRN
  Administered 2016-05-25: 2 mg via INTRAVENOUS

## 2016-05-25 MED ORDER — PROPOFOL 10 MG/ML IV BOLUS
INTRAVENOUS | Status: DC | PRN
  Administered 2016-05-25: 120 mg via INTRAVENOUS

## 2016-05-25 MED ORDER — LACTATED RINGERS IV SOLN
INTRAVENOUS | Status: DC | PRN
  Administered 2016-05-25 (×2): via INTRAVENOUS

## 2016-05-25 MED ORDER — CHLORHEXIDINE GLUCONATE CLOTH 2 % EX PADS: 6.0000 | MEDICATED_PAD | Freq: Once | CUTANEOUS | Status: DC

## 2016-05-25 MED ORDER — IOPAMIDOL (ISOVUE-300) INJECTION 61%
INTRAVENOUS | Status: AC
  Filled 2016-05-25: qty 50

## 2016-05-25 MED ORDER — PROMETHAZINE HCL 25 MG/ML IJ SOLN
INTRAMUSCULAR | Status: AC
  Filled 2016-05-25: qty 1

## 2016-05-25 MED ORDER — MIDAZOLAM HCL 2 MG/2ML IJ SOLN
INTRAMUSCULAR | Status: AC
  Filled 2016-05-25: qty 2

## 2016-05-25 MED ORDER — ONDANSETRON HCL 4 MG/2ML IJ SOLN
INTRAMUSCULAR | Status: AC
  Filled 2016-05-25: qty 2

## 2016-05-25 MED ORDER — LABETALOL HCL 5 MG/ML IV SOLN
5.0000 mg | INTRAVENOUS | Status: DC | PRN
  Administered 2016-05-25: 5 mg via INTRAVENOUS

## 2016-05-25 MED ORDER — BUPIVACAINE LIPOSOME 1.3 % IJ SUSP
20.0000 mL | Freq: Once | INTRAMUSCULAR | Status: DC
  Filled 2016-05-25: qty 20

## 2016-05-25 MED ORDER — DEXAMETHASONE SODIUM PHOSPHATE 10 MG/ML IJ SOLN
INTRAMUSCULAR | Status: AC
  Filled 2016-05-25: qty 1

## 2016-05-25 MED ORDER — DEXAMETHASONE SODIUM PHOSPHATE 10 MG/ML IJ SOLN
INTRAMUSCULAR | Status: DC | PRN
  Administered 2016-05-25: 6 mg via INTRAVENOUS

## 2016-05-25 MED ORDER — PROPOFOL 10 MG/ML IV BOLUS
INTRAVENOUS | Status: AC
  Filled 2016-05-25: qty 20

## 2016-05-25 MED ORDER — PHENYLEPHRINE 40 MCG/ML (10ML) SYRINGE FOR IV PUSH (FOR BLOOD PRESSURE SUPPORT)
PREFILLED_SYRINGE | INTRAVENOUS | Status: DC | PRN
  Administered 2016-05-25: 40 ug via INTRAVENOUS

## 2016-05-25 MED ORDER — HYDROCODONE-ACETAMINOPHEN 5-325 MG PO TABS
1.0000 | ORAL_TABLET | ORAL | Status: DC | PRN
  Administered 2016-05-25: 1 via ORAL
  Filled 2016-05-25: qty 1

## 2016-05-25 MED ORDER — LIDOCAINE 2% (20 MG/ML) 5 ML SYRINGE
INTRAMUSCULAR | Status: DC | PRN
  Administered 2016-05-25: 100 mg via INTRAVENOUS

## 2016-05-25 MED ORDER — LISINOPRIL 20 MG PO TABS
40.0000 mg | ORAL_TABLET | Freq: Every day | ORAL | Status: DC
  Administered 2016-05-25 (×2): 40 mg via ORAL
  Filled 2016-05-25 (×2): qty 1
  Filled 2016-05-25: qty 2
  Filled 2016-05-25: qty 1

## 2016-05-25 MED ORDER — SUGAMMADEX SODIUM 200 MG/2ML IV SOLN
INTRAVENOUS | Status: AC
  Filled 2016-05-25: qty 2

## 2016-05-25 MED ORDER — ONDANSETRON HCL 4 MG/2ML IJ SOLN
INTRAMUSCULAR | Status: DC | PRN
  Administered 2016-05-25: 4 mg via INTRAVENOUS

## 2016-05-25 MED ORDER — LABETALOL HCL 5 MG/ML IV SOLN
INTRAVENOUS | Status: DC | PRN
  Administered 2016-05-25: 2.5 mg via INTRAVENOUS

## 2016-05-25 MED ORDER — IOPAMIDOL (ISOVUE-300) INJECTION 61%
INTRAVENOUS | Status: DC | PRN
  Administered 2016-05-25: 50 mL via INTRAVENOUS

## 2016-05-25 MED ORDER — HYDROMORPHONE HCL 1 MG/ML IJ SOLN
INTRAMUSCULAR | Status: AC
  Filled 2016-05-25: qty 1

## 2016-05-25 MED ORDER — SCOPOLAMINE 1 MG/3DAYS TD PT72
MEDICATED_PATCH | TRANSDERMAL | Status: AC
  Filled 2016-05-25: qty 1

## 2016-05-25 MED ORDER — SCOPOLAMINE 1 MG/3DAYS TD PT72
MEDICATED_PATCH | TRANSDERMAL | Status: DC | PRN
  Administered 2016-05-25: 1 via TRANSDERMAL

## 2016-05-25 MED ORDER — CEFOTETAN DISODIUM-DEXTROSE 2-2.08 GM-% IV SOLR
2.0000 g | INTRAVENOUS | Status: AC
  Administered 2016-05-25: 2 g via INTRAVENOUS

## 2016-05-25 MED ORDER — SUFENTANIL CITRATE 50 MCG/ML IV SOLN
INTRAVENOUS | Status: DC | PRN
  Administered 2016-05-25: 10 ug via INTRAVENOUS
  Administered 2016-05-25 (×2): 5 ug via INTRAVENOUS

## 2016-05-25 MED ORDER — ONDANSETRON HCL 4 MG/2ML IJ SOLN: 4.0000 mg | Freq: Four times a day (QID) | INTRAMUSCULAR | Status: DC | PRN

## 2016-05-25 MED ORDER — PROMETHAZINE HCL 25 MG/ML IJ SOLN
6.2500 mg | INTRAMUSCULAR | Status: DC | PRN
  Administered 2016-05-25: 6.25 mg via INTRAVENOUS

## 2016-05-25 MED ORDER — INSULIN ASPART 100 UNIT/ML ~~LOC~~ SOLN
0.0000 [IU] | SUBCUTANEOUS | Status: DC
  Administered 2016-05-25 – 2016-05-26 (×4): 5 [IU] via SUBCUTANEOUS
  Administered 2016-05-26: 3 [IU] via SUBCUTANEOUS
  Administered 2016-05-26: 8 [IU] via SUBCUTANEOUS

## 2016-05-25 MED ORDER — ONDANSETRON 4 MG PO TBDP: 4.0000 mg | ORAL_TABLET | Freq: Four times a day (QID) | ORAL | Status: DC | PRN

## 2016-05-25 MED ORDER — SUGAMMADEX SODIUM 200 MG/2ML IV SOLN
INTRAVENOUS | Status: DC | PRN
  Administered 2016-05-25: 200 mg via INTRAVENOUS

## 2016-05-25 MED ORDER — PHENYLEPHRINE 40 MCG/ML (10ML) SYRINGE FOR IV PUSH (FOR BLOOD PRESSURE SUPPORT)
PREFILLED_SYRINGE | INTRAVENOUS | Status: AC
  Filled 2016-05-25: qty 10

## 2016-05-25 MED ORDER — CEFOTETAN DISODIUM-DEXTROSE 2-2.08 GM-% IV SOLR
INTRAVENOUS | Status: AC
  Filled 2016-05-25: qty 50

## 2016-05-25 SURGICAL SUPPLY — 41 items
ADH SKN CLS APL DERMABOND .7 (GAUZE/BANDAGES/DRESSINGS) ×1
APL SKNCLS STERI-STRIP NONHPOA (GAUZE/BANDAGES/DRESSINGS)
APPLICATOR COTTON TIP 6IN STRL (MISCELLANEOUS) ×2 IMPLANT
APPLIER CLIP ROT 10 11.4 M/L (STAPLE) ×2
APR CLP MED LRG 11.4X10 (STAPLE) ×1
BAG SPEC RTRVL 10 TROC 200 (ENDOMECHANICALS) ×1
BENZOIN TINCTURE PRP APPL 2/3 (GAUZE/BANDAGES/DRESSINGS) IMPLANT
CABLE HIGH FREQUENCY MONO STRZ (ELECTRODE) ×2 IMPLANT
CATH REDDICK CHOLANGI 4FR 50CM (CATHETERS) ×2 IMPLANT
CLIP APPLIE ROT 10 11.4 M/L (STAPLE) ×1 IMPLANT
COVER MAYO STAND STRL (DRAPES) ×2 IMPLANT
COVER SURGICAL LIGHT HANDLE (MISCELLANEOUS) ×1 IMPLANT
DECANTER SPIKE VIAL GLASS SM (MISCELLANEOUS) ×1 IMPLANT
DERMABOND ADVANCED (GAUZE/BANDAGES/DRESSINGS) ×1
DERMABOND ADVANCED .7 DNX12 (GAUZE/BANDAGES/DRESSINGS) ×1 IMPLANT
DRAPE C-ARM 42X120 X-RAY (DRAPES) ×2 IMPLANT
ELECT PENCIL ROCKER SW 15FT (MISCELLANEOUS) ×1 IMPLANT
ELECT REM PT RETURN 9FT ADLT (ELECTROSURGICAL) ×2
ELECTRODE REM PT RTRN 9FT ADLT (ELECTROSURGICAL) ×1 IMPLANT
GLOVE BIOGEL M 8.0 STRL (GLOVE) ×2 IMPLANT
GOWN STRL REUS W/TWL XL LVL3 (GOWN DISPOSABLE) ×6 IMPLANT
HEMOSTAT SURGICEL 4X8 (HEMOSTASIS) IMPLANT
IRRIG SUCT STRYKERFLOW 2 WTIP (MISCELLANEOUS)
IRRIGATION SUCT STRKRFLW 2 WTP (MISCELLANEOUS) ×1 IMPLANT
IV CATH 14GX2 1/4 (CATHETERS) ×2 IMPLANT
KIT BASIN OR (CUSTOM PROCEDURE TRAY) ×2 IMPLANT
L-HOOK LAP DISP 36CM (ELECTROSURGICAL) ×2
LHOOK LAP DISP 36CM (ELECTROSURGICAL) IMPLANT
POUCH RETRIEVAL ECOSAC 10 (ENDOMECHANICALS) IMPLANT
POUCH RETRIEVAL ECOSAC 10MM (ENDOMECHANICALS) ×1
SCISSORS LAP 5X45 EPIX DISP (ENDOMECHANICALS) ×2 IMPLANT
SLEEVE XCEL OPT CAN 5 100 (ENDOMECHANICALS) ×2 IMPLANT
STRIP CLOSURE SKIN 1/2X4 (GAUZE/BANDAGES/DRESSINGS) IMPLANT
SUT VIC AB 4-0 SH 18 (SUTURE) ×3 IMPLANT
SYR 20CC LL (SYRINGE) ×2 IMPLANT
TOWEL OR 17X26 10 PK STRL BLUE (TOWEL DISPOSABLE) ×2 IMPLANT
TRAY LAPAROSCOPIC (CUSTOM PROCEDURE TRAY) ×2 IMPLANT
TROCAR BLADELESS OPT 5 100 (ENDOMECHANICALS) ×2 IMPLANT
TROCAR XCEL BLUNT TIP 100MML (ENDOMECHANICALS) IMPLANT
TROCAR XCEL NON-BLD 11X100MML (ENDOMECHANICALS) ×2 IMPLANT
TUBING INSUF HEATED (TUBING) ×2 IMPLANT

## 2016-05-25 NOTE — Interval H&P Note (Signed)
History and Physical Interval Note:  05/25/2016 11:17 AM  Marie [name]  has presented today for surgery, with the diagnosis of HISTORY OF ACUTE CHOLECYSTITIS WITH DRAIN  The various methods of treatment have been discussed with the patient and family. After consideration of risks, benefits and other options for treatment, the patient has consented to  Procedure(s): LAPAROSCOPIC CHOLECYSTECTOMY, POSSIBLE OPEN (N/A) as a surgical intervention .  The patient's history has been reviewed, patient examined, no change in status, stable for surgery.  I have reviewed the patient's chart and labs.  Questions were answered to the patient's satisfaction.     Versie Soave B

## 2016-05-25 NOTE — Anesthesia Procedure Notes (Signed)
Procedure Name: Intubation Date/Time: 05/25/2016 11:30 AM Performed by: Daksh Coates, Nuala AlphaKRISTOPHER Pre-anesthesia Checklist: Patient identified, Emergency Drugs available, Suction available, Patient being monitored and Timeout performed Patient Re-evaluated:Patient Re-evaluated prior to inductionOxygen Delivery Method: Circle system utilized Preoxygenation: Pre-oxygenation with 100% oxygen Intubation Type: IV induction Ventilation: Mask ventilation without difficulty Laryngoscope Size: Miller and 2 Grade View: Grade I Tube type: Oral Tube size: 7.5 mm Number of attempts: 1 Airway Equipment and Method: Stylet Placement Confirmation: ETT inserted through vocal cords under direct vision,  positive ETCO2 and breath sounds checked- equal and bilateral Secured at: 20 cm Tube secured with: Tape Dental Injury: Teeth and Oropharynx as per pre-operative assessment  Comments: Intubated by Larey SeatJ Slabach SRNA

## 2016-05-25 NOTE — Anesthesia Postprocedure Evaluation (Addendum)
Anesthesia Post Note  Patient: Marie RinksFarida Mastro  Procedure(s) Performed: Procedure(s) (LRB): LAPAROSCOPIC CHOLECYSTECTOMY WITH INTRAOPERATIVE CHOLANGIOGRAM (N/A)  Patient location during evaluation: PACU Anesthesia Type: General Level of consciousness: awake and sedated Pain management: pain level controlled Vital Signs Assessment: post-procedure vital signs reviewed and stable Respiratory status: spontaneous breathing Cardiovascular status: stable Postop Assessment: no signs of nausea or vomiting Anesthetic complications: no        Last Vitals:  Vitals:   05/25/16 1345 05/25/16 1400  BP: (!) 172/85 (!) 174/87  Pulse: 65 69  Resp: 19 14  Temp:      Last Pain:  Vitals:   05/25/16 1315  TempSrc:   PainSc: Asleep   Pain Goal: Patients Stated Pain Goal: 3 (05/25/16 0954)               Kreston Ahrendt JR,JOHN Susann GivensFRANKLIN

## 2016-05-25 NOTE — H&P (Signed)
Chief Complaint:  Chronic cholecystitis with percutaneous drainage tube in place  History of Present Illness:  Marie [name] is an 61 y.o. female from [place] who presented with a thick walled gallbladder on antiplatetet agents.  Her cholecystitis was treated with percutaneous drainage.  She presents for interval cholecystectomy and drain removal  Past Medical History:  Diagnosis Date  . CVA (cerebral vascular accident) (HCC) 2015  . Depression   . Diabetes (HCC)    t2  . Gallstones   . Hyperlipemia   . Hypertension   . Migraines     Past Surgical History:  Procedure Laterality Date  . CESAREAN SECTION     x2  . IR GENERIC HISTORICAL  03/13/2016   IR PERC CHOLECYSTOSTOMY 03/13/2016 [name], MD [facility]  . IR GENERIC HISTORICAL  04/28/2016   IR RADIOLOGIST EVAL & MGMT 04/28/2016 [name], MD [facility] INTER[facility]  . LOOP RECORDER IMPLANT N/A 11/29/2013   Procedure: LOOP RECORDER IMPLANT;  Surgeon: Marinus Maw, MD;  Location: [facility] CATH LAB;  Service: Cardiovascular;  Laterality: N/A;  . TEE WITHOUT CARDIOVERSION N/A 08/24/2013   Procedure: TRANSESOPHAGEAL ECHOCARDIOGRAM (TEE);  Surgeon: [name], MD;  Location: [facility] ENDOSCOPY;  Service: Cardiovascular;  Laterality: N/A;    Current Facility-Administered Medications  Medication Dose Route Frequency Provider Last Rate Last Dose  . bupivacaine liposome (EXPAREL) 1.3 % injection 266 mg  20 mL Infiltration Once Luretha Murphy, MD      . cefoTEtan in Dextrose 5% (CEFOTAN) IVPB 2 g  2 g Intravenous On Call to OR Luretha Murphy, MD       Facility-Administered Medications Ordered in Other Encounters  Medication Dose Route Frequency Provider Last Rate Last Dose  . lactated ringers infusion   Intravenous Continuous PRN Kristopher Key, CRNA       Penicillins and Other Family History  Problem Relation Age of Onset  . Stomach cancer Mother   . High blood pressure Mother   . Diabetes Maternal Grandfather   . High blood pressure  Maternal Grandfather    Social History:   reports that she has never smoked. She has never used smokeless tobacco. She reports that she does not drink alcohol or use drugs.   REVIEW OF SYSTEMS : Negative except for see problem list  Physical Exam:   Blood pressure (!) 157/87, pulse 95, temperature 98.6 F (37 C), temperature source Oral, resp. rate 16, height 5' 2.5" (1.588 m), weight 59.8 kg (131 lb 12.8 oz), SpO2 100 %. Body mass index is 23.72 kg/m.  Gen:  WDWN [place] F NAD  Neurological: Alert and oriented to person, place, and time. Motor and sensory function is grossly intact  Head: Normocephalic and atraumatic.  Eyes: Conjunctivae are normal. Pupils are equal, round, and reactive to light. No scleral icterus.  Neck: Normal range of motion. Neck supple. No tracheal deviation or thyromegaly present.  Cardiovascular:  SR without murmurs or gallops.  No carotid bruits Breast:  Not examined Respiratory: Effort normal.  No respiratory distress. No chest wall tenderness. Breath sounds normal.  No wheezes, rales or rhonchi.  Abdomen:  Percutaneous drain on the right  GU:  Not examined Musculoskeletal: Normal range of motion. Extremities are nontender. No cyanosis, edema or clubbing noted Lymphadenopathy: No cervical, preauricular, postauricular or axillary adenopathy is present Skin: Skin is warm and dry. No rash noted. No diaphoresis. No erythema. No pallor. Pscyh: Normal mood and affect. Behavior is normal. Judgment and thought content normal.   LABORATORY RESULTS: Results  for orders placed or performed during the hospital encounter of 05/25/16 (from the past 48 hour(s))  Glucose, capillary     Status: Abnormal   Collection Time: 05/25/16  9:24 AM  Result Value Ref Range   Glucose-Capillary 186 (H) 65 - 99 mg/dL   Comment 1 Notify RN      RADIOLOGY RESULTS: No results found.  Problem List: Patient Active Problem List   Diagnosis Date Noted  . Acute cholecystitis  03/13/2016  . Left ovarian enlargement 03/13/2016  . Leukocytosis 03/13/2016  . Cholecystitis 03/12/2016  . Weakness 07/01/2015  . Diabetic peripheral neuropathy (HCC) 01/24/2015  . Hypertensive heart disease   . History of CVA (cerebrovascular accident) (multiple, recurrent) 06/12/2013  . Cerebral atherosclerosis 06/12/2013  . Hyperlipidemia   . Migraines   . Diabetes with retinopathy [facility](HCC)     Assessment & Plan: Two months post percutaneous cholecystostomy;  For lap/open cholecystectomy    Matt B. Daphine DeutscherMartin, MD, [facility]  [facility] [phone] beeper [phone]  05/25/2016 11:14 AM

## 2016-05-25 NOTE — Progress Notes (Signed)
Dr Arby BarretteHatchett aware pt received promethazine for N/V, and BP of 197/78.  Order received to give labetalol 5 mg x 2 doses.

## 2016-05-25 NOTE — Transfer of Care (Signed)
Immediate Anesthesia Transfer of Care Note  Patient: Marie RinksFarida Conant  Procedure(s) Performed: Procedure(s): LAPAROSCOPIC CHOLECYSTECTOMY WITH INTRAOPERATIVE CHOLANGIOGRAM (N/A)  Patient Location: PACU  Anesthesia Type:General  Level of Consciousness:  sedated, patient cooperative and responds to stimulation  Airway & Oxygen Therapy:Patient Spontanous Breathing and Patient connected to face mask oxgen  Post-op Assessment:  Report given to PACU RN and Post -op Vital signs reviewed and stable  Post vital signs:  Reviewed and stable  Last Vitals:  Vitals:   05/25/16 1313 05/25/16 1315  BP: (!) 157/78   Pulse: 82 77  Resp: 15 16  Temp:      Complications: No apparent anesthesia complications

## 2016-05-25 NOTE — Anesthesia Preprocedure Evaluation (Signed)
Anesthesia Evaluation  Patient identified by MRN, date of birth, ID band Patient awake    Reviewed: Allergy & Precautions, NPO status , Patient's Chart, lab work & pertinent test results  History of Anesthesia Complications (+) Family history of anesthesia reaction  Airway Mallampati: II       Dental no notable dental hx.    Pulmonary    Pulmonary exam normal        Cardiovascular hypertension, Normal cardiovascular exam     Neuro/Psych    GI/Hepatic negative GI ROS, Neg liver ROS,   Endo/Other  diabetes, Type 2, Oral Hypoglycemic Agents  Renal/GU negative Renal ROS  negative genitourinary   Musculoskeletal negative musculoskeletal ROS (+)   Abdominal Normal abdominal exam  (+)   Peds  Hematology   Anesthesia Other Findings   Reproductive/Obstetrics                             Anesthesia Physical Anesthesia Plan  ASA: II  Anesthesia Plan: General   Post-op Pain Management:    Induction: Intravenous  Airway Management Planned: Oral ETT  Additional Equipment:   Intra-op Plan:   Post-operative Plan: Extubation in OR  Informed Consent: I have reviewed the patients History and Physical, chart, labs and discussed the procedure including the risks, benefits and alternatives for the proposed anesthesia with the patient or authorized representative who has indicated his/her understanding and acceptance.     Plan Discussed with: CRNA and Surgeon  Anesthesia Plan Comments:         Anesthesia Quick Evaluation

## 2016-05-25 NOTE — Progress Notes (Signed)
Dr Arby BarretteHatchett aware of BP of 172/85.  Order received to give her daily BP med lisinipril

## 2016-05-25 NOTE — Brief Op Note (Signed)
05/25/2016  1:06 PM  PATIENT:  Marie [name]  61 y.o. female  PRE-OPERATIVE DIAGNOSIS:  HISTORY OF ACUTE CHOLECYSTITIS WITH DRAIN  POST-OPERATIVE DIAGNOSIS:  HISTORY OF ACUTE CHOLECYSTITIS WITH DRAIN  PROCEDURE:  Procedure(s): LAPAROSCOPIC CHOLECYSTECTOMY WITH INTRAOPERATIVE CHOLANGIOGRAM (N/A)  SURGEON:  Surgeon(s) and Role:    * Luretha MurphyMatthew Arun Herrod, MD - Primary    * Glenna FellowsBenjamin Hoxworth, MD - Assisting  PHYSICIAN ASSISTANT:   ASSISTANTS: Hoxworth   ANESTHESIA:   general  EBL:  Total I/O In: 1000 [I.V.:1000] Out: 25 [Blood:25]  BLOOD ADMINISTERED:none  DRAINS: none   LOCAL MEDICATIONS USED:  BUPIVICAINE   SPECIMEN:  Source of Specimen:  gallbladder  DISPOSITION OF SPECIMEN:  PATHOLOGY  COUNTS:  YES  TOURNIQUET:  * No tourniquets in log *  DICTATION: .Other Dictation: Dictation Number [id number]  PLAN OF CARE: Admit for overnight observation  PATIENT DISPOSITION:  PACU - hemodynamically stable.   Delay start of Pharmacological VTE agent (>24hrs) due to surgical blood loss or risk of bleeding: no

## 2016-05-26 ENCOUNTER — Encounter (HOSPITAL_COMMUNITY): Payer: Self-pay | Admitting: Surgery

## 2016-05-26 DIAGNOSIS — K8010 Calculus of gallbladder with chronic cholecystitis without obstruction: Secondary | ICD-10-CM | POA: Diagnosis not present

## 2016-05-26 LAB — GLUCOSE, CAPILLARY
GLUCOSE-CAPILLARY: 289 mg/dL — AB (ref 65–99)
Glucose-Capillary: 192 mg/dL — ABNORMAL HIGH (ref 65–99)
Glucose-Capillary: 205 mg/dL — ABNORMAL HIGH (ref 65–99)
Glucose-Capillary: 246 mg/dL — ABNORMAL HIGH (ref 65–99)

## 2016-05-26 MED ORDER — HYDROCODONE-ACETAMINOPHEN 5-325 MG PO TABS: 1.0000 | ORAL_TABLET | ORAL | 0 refills | Status: DC | PRN

## 2016-05-26 MED ORDER — BISACODYL 5 MG PO TBEC: 5.0000 mg | DELAYED_RELEASE_TABLET | Freq: Every day | ORAL | 0 refills | Status: DC | PRN

## 2016-05-26 MED ORDER — MENTHOL 3 MG MT LOZG
1.0000 | LOZENGE | OROMUCOSAL | Status: DC | PRN
  Filled 2016-05-26: qty 9

## 2016-05-26 NOTE — Anesthesia Postprocedure Evaluation (Signed)
Anesthesia Post Note  Patient: Marie [name]  Procedure(s) Performed: Procedure(s) (LRB): LAPAROSCOPIC CHOLECYSTECTOMY WITH INTRAOPERATIVE CHOLANGIOGRAM (N/A)  Patient location during evaluation: PACU Anesthesia Type: General Level of consciousness: awake and sedated Pain management: pain level controlled Vital Signs Assessment: post-procedure vital signs reviewed and stable Respiratory status: spontaneous breathing Cardiovascular status: stable Postop Assessment: no signs of nausea or vomiting Anesthetic complications: no        Last Vitals:  Vitals:   05/26/16 0248 05/26/16 0542  BP: (!) 108/54 (!) 105/46  Pulse: 82 77  Resp: 16 16  Temp: 37.6 C 37.1 C    Last Pain:  Vitals:   05/26/16 0542  TempSrc: Oral  PainSc:    Pain Goal: Patients Stated Pain Goal: 2 (05/25/16 2235)               Tehran Rabenold JR,JOHN Susann GivensFRANKLIN

## 2016-05-26 NOTE — Progress Notes (Signed)
Pt was discharged home today. Instructions were reviewed with patient,prescription was given to patient and questions were answered. Pt was taken to main entrance via wheelchair by NT.  

## 2016-05-26 NOTE — Op Note (Signed)
NAMVertell Novak:  [name], Marie                 ACCOUNT NO.:  [id number]  MEDICAL RECORD NO.:  [id number]  LOCATION:                                 FACILITY:  PHYSICIAN:  Thornton ParkMatthew B. Daphine DeutscherMartin, MD  DATE OF BIRTH:  [birth year]  DATE OF PROCEDURE:  05/25/2016 DATE OF DISCHARGE:                              OPERATIVE REPORT   PREOPERATIVE DIAGNOSIS:  Two months status post percutaneous drainage of gallbladder for subacute cholecystitis on any platelet agents.  PROCEDURES:  Laparoscopic cholecystectomy with intraoperative cholangiogram and removal of percutaneous drain.  SURGEON:  Thornton ParkMatthew B. Daphine DeutscherMartin, MD.  ASSISTANSharlet Salina:  Benjamin T. Hoxworth, M.D.  ANESTHESIA:  General endotracheal.  DESCRIPTION OF PROCEDURE:  The patient was taken to room 1 and given general anesthesia.  The abdomen was prepped with TechniCare and draped sterilely.  After time-out, access to the abdomen was achieved through the left upper quadrant with a 5-mm Optiview without difficulty.  The drain had been prepped out and was over on the patient's right side. The separate 5-mm was placed slightly to the left of the umbilicus and then two ports were placed laterally on the left and then another placed obliquely, which was a 12 mm.  The gallbladder was elevated and the Calot's triangle exposed.  Peritoneum overlying this area was taken down with hook Bovie without difficulty.  I skeletonized this revealing the cystic artery and the cystic duct.  The cystic artery was double clipped and divided and then the cystic duct was clipped up on the gallbladder side, incised and milked out and then a cholangiogram was obtained, which showed good intrahepatic filling and free flow into the duodenum. The cystic duct remnant was then triple clipped, divided, and then the gallbladder was removed from the gallbladder bed with minimal spillage and certainly, no stone spillage, but the patient did have a drain tract up near the top, which we had  removed once we got in and once this was detached, it was placed in a bag and brought out through the 12-mm port. The gallbladder bed was surveyed and no bleeding was noted.  Everything appeared to be in order and so, port sites were injected with Exparel and the abdomen was deflated.  The wounds were closed with 4-0 Monocryl and Dermabond.  The patient was taken to the recovery room in satisfactory condition.     Thornton ParkMatthew B. Daphine DeutscherMartin, MD   ______________________________ Thornton ParkMatthew B. Daphine DeutscherMartin, MD    MBM/MEDQ  D:  05/25/2016  T:  05/25/2016  Job:  [id number]

## 2016-05-26 NOTE — Discharge Instructions (Signed)
Laparoscopic Cholecystectomy Laparoscopic cholecystectomy is surgery to remove the gallbladder. The gallbladder is a pear-shaped organ that lies beneath the liver on the right side of the body. The gallbladder stores bile, which is a fluid that helps the body to digest fats. Cholecystectomy is often done for inflammation of the gallbladder (cholecystitis). This condition is usually caused by a buildup of gallstones (cholelithiasis) in the gallbladder. Gallstones can block the flow of bile, which can result in inflammation and pain. In severe cases, emergency surgery may be required. This procedure is done though small incisions in your abdomen (laparoscopic surgery). A thin scope with a camera (laparoscope) is inserted through one incision. Thin surgical instruments are inserted through the other incisions. In some cases, a laparoscopic procedure may be turned into a type of surgery that is done through a larger incision (open surgery). Tell a health care provider about:  Any allergies you have.  All medicines you are taking, including vitamins, herbs, eye drops, creams, and over-the-counter medicines.  Any problems you or family members have had with anesthetic medicines.  Any blood disorders you have.  Any surgeries you have had.  Any medical conditions you have.  Whether you are pregnant or may be pregnant. What are the risks? Generally, this is a safe procedure. However, problems may occur, including:  Infection.  Bleeding.  Allergic reactions to medicines.  Damage to other structures or organs.  A stone remaining in the common bile duct. The common bile duct carries bile from the gallbladder into the small intestine.  A bile leak from the cyst duct that is clipped when your gallbladder is removed. What happens before the procedure? Staying hydrated  Follow instructions from your health care provider about hydration, which may include:  Up to 2 hours before the procedure - you  may continue to drink clear liquids, such as water, clear fruit juice, black coffee, and plain tea. Eating and drinking restrictions  Follow instructions from your health care provider about eating and drinking, which may include:  8 hours before the procedure - stop eating heavy meals or foods such as meat, fried foods, or fatty foods.  6 hours before the procedure - stop eating light meals or foods, such as toast or cereal.  6 hours before the procedure - stop drinking milk or drinks that contain milk.  2 hours before the procedure - stop drinking clear liquids. Medicines   Ask your health care provider about:  Changing or stopping your regular medicines. This is especially important if you are taking diabetes medicines or blood thinners.  Taking medicines such as aspirin and ibuprofen. These medicines can thin your blood. Do not take these medicines before your procedure if your health care provider instructs you not to.  You may be given antibiotic medicine to help prevent infection. General instructions   Let your health care provider know if you develop a cold or an infection before surgery.  Plan to have someone take you home from the hospital or clinic.  Ask your health care provider how your surgical site will be marked or identified. What happens during the procedure?  To reduce your risk of infection:  Your health care team will wash or sanitize their hands.  Your skin will be washed with soap.  Hair may be removed from the surgical area.  An IV tube may be inserted into one of your veins.  You will be given one or more of the following:  A medicine to help you relax (  sedative).  A medicine to make you fall asleep (general anesthetic).  A breathing tube will be placed in your mouth.  Your surgeon will make several small cuts (incisions) in your abdomen.  The laparoscope will be inserted through one of the small incisions. The camera on the laparoscope will  send images to a TV screen (monitor) in the operating room. This lets your surgeon see inside your abdomen.  Air-like gas will be pumped into your abdomen. This will expand your abdomen to give the surgeon more room to perform the surgery.  Other tools that are needed for the procedure will be inserted through the other incisions. The gallbladder will be removed through one of the incisions.  Your common bile duct may be examined. If stones are found in the common bile duct, they may be removed.  After your gallbladder has been removed, the incisions will be closed with stitches (sutures), staples, or skin glue.  Your incisions may be covered with a bandage (dressing). The procedure may vary among health care providers and hospitals. What happens after the procedure?  Your blood pressure, heart rate, breathing rate, and blood oxygen level will be monitored until the medicines you were given have worn off.  You will be given medicines as needed to control your pain.  Do not drive for 24 hours if you were given a sedative. This information is not intended to replace advice given to you by your health care provider. Make sure you discuss any questions you have with your health care provider. Document Released: 03/23/2005 Document Revised: 10/13/2015 Document Reviewed: 09/09/2015 Elsevier Interactive Patient Education  2017 Elsevier Inc.  

## 2016-05-26 NOTE — Discharge Summary (Signed)
Physician Discharge Summary  Patient ID: Marie [name] MRN: [id number] DOB/AGE: 09-12-1955 61 y.o.  Admit date: 05/25/2016 Discharge date: 05/26/2016  Admission Diagnoses:  Chronic cholecystitis with a percutaneous drain  Discharge Diagnoses:  same  Active Problems:   S/P laparoscopic cholecystectomy Feb 2018   Surgery:  Lap chole with IOC and removal of percutanous drain  Discharged Condition: improved  Hospital Course:   Had surgery on Monday.  Kept overnight and ready for discharge on Tuesday  Consults: none  Significant Diagnostic Studies: none    Discharge Exam: Blood pressure (!) 119/55, pulse 94, temperature 99.7 F (37.6 C), temperature source Oral, resp. rate 16, height 5' 2.5" (1.588 m), weight 59.8 kg (131 lb 12.8 oz), SpO2 99 %. Incisions OK  Disposition: 01-Home or Self Care  Discharge Instructions    Call MD for:  temperature >100.4    Complete by:  As directed    Diet - low sodium heart healthy    Complete by:  As directed    Discharge instructions    Complete by:  As directed    May shower at home.   Increase activity slowly    Complete by:  As directed      Allergies as of 05/26/2016      Reactions   Penicillins Other (See Comments)   Driving couldn't see and head felt heavy  Has patient had a PCN reaction causing immediate rash, facial/tongue/throat swelling, SOB or lightheadedness with hypotension: No Has patient had a PCN reaction causing severe rash involving mucus membranes or skin necrosis: No Has patient had a PCN reaction that required hospitalization: No Has patient had a PCN reaction occurring within the last 10 years: No If all of the above answers are "NO", then may proceed with Cephalosporin use.   Other Other (See Comments)   Allergic to beef and eggplant causes rash on skin      Medication List    STOP taking these medications   clopidogrel 75 MG tablet Commonly known as:  PLAVIX   venlafaxine XR 37.5 MG 24 hr  capsule Commonly known as:  EFFEXOR XR     TAKE these medications   aspirin EC 81 MG tablet Take 81 mg by mouth at bedtime.   atorvastatin 20 MG tablet Commonly known as:  LIPITOR Take 1 tablet (20 mg total) by mouth daily. What changed:  when to take this   bisacodyl 5 MG EC tablet Commonly known as:  bisacodyl Take 1 tablet (5 mg total) by mouth daily as needed for moderate constipation.   CoQ10 200 MG Caps Take 200 mg by mouth at bedtime.   docusate sodium 100 MG capsule Commonly known as:  COLACE Take 1 capsule (100 mg total) by mouth 2 (two) times daily.   Ginkgo Biloba 500 MG Caps Take 500 mg by mouth at bedtime.   HYDROcodone-acetaminophen 5-325 MG tablet Commonly known as:  NORCO/VICODIN Take 1-2 tablets by mouth every 4 (four) hours as needed for moderate pain.   lisinopril 40 MG tablet Commonly known as:  PRINIVIL,ZESTRIL Take 40 mg by mouth at bedtime.   metFORMIN 500 MG tablet Commonly known as:  GLUCOPHAGE Take 250-500 mg by mouth 2 (two) times daily. 250 mg in the morning after breakfast & 500 mg at bedtime.   multivitamin with minerals Tabs tablet Take 1 tablet by mouth daily.   Omega-3 Fish Oil 1200 MG Caps Take 1,200 mg by mouth at bedtime.   ondansetron 4 MG disintegrating tablet Commonly known  as:  ZOFRAN ODT 4mg  ODT q4 hours prn nausea/vomit   Vitamin D3 3000 units Tabs Take 3,000 Units by mouth at bedtime.      Follow-up Information    Lundy Cozart B, MD. Schedule an appointment as soon as possible for a visit in 4 week(s).   Specialty:  General Surgery Contact information: [address] [place] [address] [phone]           Signed: Valarie Merino 05/26/2016, 11:27 AM

## 2016-05-27 LAB — HEMOGLOBIN A1C
HEMOGLOBIN A1C: 8.6 % — AB (ref 4.8–5.6)
Mean Plasma Glucose: 200

## 2016-06-05 ENCOUNTER — Other Ambulatory Visit: Payer: Self-pay | Admitting: Family Medicine

## 2016-06-05 DIAGNOSIS — N838 Other noninflammatory disorders of ovary, fallopian tube and broad ligament: Secondary | ICD-10-CM

## 2016-06-12 ENCOUNTER — Other Ambulatory Visit (HOSPITAL_COMMUNITY): Payer: Self-pay | Admitting: Cardiovascular Disease

## 2016-06-16 ENCOUNTER — Telehealth: Payer: Self-pay | Admitting: *Deleted

## 2016-06-16 NOTE — Telephone Encounter (Signed)
Labs received from PCP (collected on 06/02/16):  CBC: HGB: 11.7 Low  CMP: Glucose: 380 High  HGB A1C: 8.7 High  LIPID PANEL: wnl  VITAMIN D: 31.5 wnl

## 2016-06-19 ENCOUNTER — Ambulatory Visit
Admission: RE | Admit: 2016-06-19 | Discharge: 2016-06-19 | Disposition: A | Discharge status: Home / Self Care | Payer: BC Managed Care – PPO | Source: Ambulatory Visit | Attending: Family Medicine | Admitting: Family Medicine

## 2016-06-19 DIAGNOSIS — N838 Other noninflammatory disorders of ovary, fallopian tube and broad ligament: Secondary | ICD-10-CM

## 2016-07-27 ENCOUNTER — Encounter (HOSPITAL_COMMUNITY): Payer: Self-pay | Admitting: *Deleted

## 2016-09-11 NOTE — Addendum Note (Signed)
Addendum  created 09/11/16 1021 by Thao Vanover, MD   Sign clinical note    

## 2016-09-17 ENCOUNTER — Telehealth: Payer: Self-pay | Admitting: Neurology

## 2016-09-17 NOTE — Telephone Encounter (Signed)
Pt called said she was sick at last appt and could not come. An appt has been scheduled for 9/18 but she is wanting to be seen if possible before 7/18. She is leaving the country for 3 mths. Pt said she is having pain in the upper right arm and when it hangs it is painful. Please call to advise.

## 2016-09-17 NOTE — Telephone Encounter (Signed)
Yes, schedule to NP

## 2016-09-18 NOTE — Telephone Encounter (Signed)
Called pt, appt scheduled w/ Megan on Mon.

## 2016-09-21 ENCOUNTER — Ambulatory Visit: Payer: BC Managed Care – PPO | Admitting: Adult Health

## 2016-10-01 ENCOUNTER — Ambulatory Visit: Payer: BC Managed Care – PPO | Admitting: Adult Health

## 2016-10-19 ENCOUNTER — Ambulatory Visit: Payer: BC Managed Care – PPO | Admitting: Endocrinology

## 2016-12-14 ENCOUNTER — Ambulatory Visit: Payer: Self-pay | Admitting: Neurology

## 2016-12-22 ENCOUNTER — Ambulatory Visit: Payer: Self-pay | Admitting: Neurology

## 2016-12-22 ENCOUNTER — Telehealth: Payer: Self-pay | Admitting: *Deleted

## 2016-12-22 NOTE — Telephone Encounter (Signed)
No showed follow up appointment. 

## 2016-12-23 ENCOUNTER — Encounter: Payer: Self-pay | Admitting: Neurology

## 2017-03-09 ENCOUNTER — Ambulatory Visit: Payer: BC Managed Care – PPO | Admitting: Endocrinology

## 2017-03-09 ENCOUNTER — Encounter: Payer: Self-pay | Admitting: Endocrinology

## 2017-03-09 VITALS — BP 142/64 | HR 87 | Wt 128.4 lb

## 2017-03-09 DIAGNOSIS — E11319 Type 2 diabetes mellitus with unspecified diabetic retinopathy without macular edema: Secondary | ICD-10-CM

## 2017-03-09 LAB — POCT GLYCOSYLATED HEMOGLOBIN (HGB A1C): Hemoglobin A1C: 9.6

## 2017-03-09 MED ORDER — METFORMIN HCL ER 500 MG PO TB24: 2000.0000 mg | ORAL_TABLET | Freq: Every day | ORAL | 3 refills | Status: DC

## 2017-03-09 MED ORDER — LINAGLIPTIN 5 MG PO TABS: 5.0000 mg | ORAL_TABLET | Freq: Every day | ORAL | 3 refills | Status: DC

## 2017-03-09 MED ORDER — FREESTYLE LIBRE 14 DAY SENSOR MISC: 1.0000 | 3 refills | Status: DC

## 2017-03-09 NOTE — Patient Instructions (Addendum)
good diet and exercise significantly improve the control of your diabetes.  please let me know if you wish to be referred to a dietician.  high blood sugar is very risky to your health.  you should see an eye doctor and dentist every year.  It is very important to get all recommended vaccinations.  Controlling your blood pressure and cholesterol drastically reduces the damage diabetes does to your body.  Those who smoke should quit.  Please discuss these with your doctor.  I have sent a prescription to your pharmacy, for the freestyle Carylibre sensors. I have also sent a prescription to your pharmacy, to add "tradjenta." Please call or message us next week, to tell us how the blood sugar is doing.  We would probably add "farxiga." We can add anothjer medication every week, as we need to.  Please come back for a follow-up appointment in 2 months.

## 2017-03-09 NOTE — Progress Notes (Signed)
Subjective:    Patient ID: Marie [name], female    DOB: [birth year], 61 y.o.   MRN: [id number]  HPI pt is referred by Dr Zachery Dauer, for diabetes.  Pt was given an appointment 5 months ago, but she cancelled it.  Pt states DM was dx'ed in 2010; she has mild neuropathy of the lower extremities; she has associated CVA and DR; she has never been on insulin; pt says her diet is good, but exercise is limited by right knee pain; she has never had GDM, pancreatitis, pancreatic surgery, severe hypoglycemia or DKA.  She has lost 10 lbs x 7 months.  She has no residual deficit from CVA's.  She has freestyle libre. She says glucoses are in the 200's.    Past Medical History:  Diagnosis Date  . CVA (cerebral vascular accident) (HCC) 2015  . Depression   . Diabetes (HCC)    t2  . Gallstones   . Hyperlipemia   . Hypertension   . Migraines     Past Surgical History:  Procedure Laterality Date  . CESAREAN SECTION     x2  . CHOLECYSTECTOMY N/A 05/25/2016   Procedure: LAPAROSCOPIC CHOLECYSTECTOMY WITH INTRAOPERATIVE CHOLANGIOGRAM;  Surgeon: Luretha Murphy, MD;  Location: [facility] ORS;  Service: General;  Laterality: N/A;  . IR GENERIC HISTORICAL  03/13/2016   IR PERC CHOLECYSTOSTOMY 03/13/2016 [name], MD [facility]INTER[facility] RAD  . IR GENERIC HISTORICAL  04/28/2016   IR RADIOLOGIST EVAL & MGMT 04/28/2016 [name], MD [facility] INTERV RAD  . LOOP RECORDER IMPLANT N/A 11/29/2013   Procedure: LOOP RECORDER IMPLANT;  Surgeon: Marinus Maw, MD;  Location: [facility] CATH LAB;  Service: Cardiovascular;  Laterality: N/A;  . TEE WITHOUT CARDIOVERSION N/A 08/24/2013   Procedure: TRANSESOPHAGEAL ECHOCARDIOGRAM (TEE);  Surgeon: [name], MD;  Location: [facility] ENDOSCOPY;  Service: Cardiovascular;  Laterality: N/A;    Social History   Socioeconomic History  . Marital status: Married    Spouse name: Not on file  . Number of children: 3  . Years of education: MBA  . Highest education level: Not on file  Social Needs  . Financial  resource strain: Not on file  . Food insecurity - worry: Not on file  . Food insecurity - inability: Not on file  . Transportation needs - medical: Not on file  . Transportation needs - non-medical: Not on file  Occupational History    Employer: [employer]    Comment: [employer]  Tobacco Use  . Smoking status: Never Smoker  . Smokeless tobacco: Never Used  Substance and Sexual Activity  . Alcohol use: No    Alcohol/week: 0.5 oz    Types: 1 Standard drinks or equivalent per week    Comment: Social  . Drug use: No  . Sexual activity: Not on file  Other Topics Concern  . Not on file  Social History Narrative   Patient lives at home with [name].   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          Current Outpatient Medications on File Prior to Visit  Medication Sig Dispense Refill  . amLODipine (NORVASC) 5 MG tablet Take 5 mg by mouth daily.    [name] aspirin EC 81 MG tablet Take 81 mg by mouth at bedtime.    [name] atorvastatin (LIPITOR) 20 MG tablet Take 1 tablet (20 mg total) by mouth daily. (Patient taking  differently: Take 20 mg by mouth at bedtime. ) 90 tablet 3  . bisacodyl (BISACODYL) 5 MG EC tablet Take 1 tablet (5 mg total) by mouth daily as needed for moderate constipation. 30 tablet 0  . Cholecalciferol (VITAMIN D3) 3000 units TABS Take 3,000 Units by mouth at bedtime.    . Coenzyme Q10 (COQ10) 200 MG CAPS Take 200 mg by mouth at bedtime.    . docusate sodium (COLACE) 100 MG capsule Take 1 capsule (100 mg total) by mouth 2 (two) times daily. 100 capsule 0  . Ginkgo Biloba 500 MG CAPS Take 500 mg by mouth at bedtime.     [name]. glucose blood (ONETOUCH VERIO) test strip 1 each by Other route as needed for other. Use as instructed    . HYDROcodone-acetaminophen (NORCO/VICODIN) 5-325 MG tablet Take 1-2 tablets by mouth every 4 (four) hours as needed for moderate pain. 30 tablet 0  . lisinopril  (PRINIVIL,ZESTRIL) 40 MG tablet Take 40 mg by mouth at bedtime.    . Multiple Vitamin (MULTIVITAMIN WITH MINERALS) TABS tablet Take 1 tablet by mouth daily.    . Omega-3 Fatty Acids (OMEGA-3 FISH OIL) 1200 MG CAPS Take 1,200 mg by mouth at bedtime.    . ondansetron (ZOFRAN ODT) 4 MG disintegrating tablet 4mg  ODT q4 hours prn nausea/vomit 20 tablet 0   No current facility-administered medications on file prior to visit.     Allergies  Allergen Reactions  . Penicillins Other (See Comments)    Driving couldn't see and head felt heavy  Has patient had a PCN reaction causing immediate rash, facial/tongue/throat swelling, SOB or lightheadedness with hypotension: No Has patient had a PCN reaction causing severe rash involving mucus membranes or skin necrosis: No Has patient had a PCN reaction that required hospitalization: No Has patient had a PCN reaction occurring within the last 10 years: No If all of the above answers are "NO", then may proceed with Cephalosporin use.   . Other Other (See Comments)    Allergic to beef and eggplant causes rash on skin    Family History  Problem Relation Age of Onset  . Stomach cancer Mother   . High blood pressure Mother   . Diabetes Mother   . Diabetes Maternal Grandfather   . High blood pressure Maternal Grandfather     BP (!) 142/64 (BP Location: Left Arm, Patient Position: Sitting, Cuff Size: Normal)   Pulse 87   Wt 128 lb 6.4 oz (58.2 kg)   SpO2 99%   BMI 23.11 kg/m    Review of Systems denies fatigue, headache, chest pain, sob, n/v, muscle cramps, excessive diaphoresis, memory loss, cold intolerance, and rhinorrhea.  She sees opthal for visual loss.  She has urinary frequency and mild depression.      Objective:   Physical Exam VS: see vs page GEN: no distress HEAD: head: no deformity eyes: no periorbital swelling, no proptosis external nose and ears are normal mouth: no lesion seen NECK: supple, thyroid is not enlarged CHEST  WALL: no deformity LUNGS: clear to auscultation CV: reg rate and rhythm, no murmur ABD: abdomen is soft, nontender.  no hepatosplenomegaly.  not distended.  no hernia MUSCULOSKELETAL: muscle bulk and strength are grossly normal.  no obvious joint swelling.  gait is normal and steady EXTEMITIES: no deformity.  no ulcer on the feet.  feet are of normal color and temp.  no edema PULSES: dorsalis pedis intact bilat.  no carotid bruit NEURO:  cn 2-12 grossly intact.  readily moves all 4's.  sensation is intact to touch on the feet SKIN:  Normal texture and temperature.  No rash or suspicious lesion is visible.   NODES:  None palpable at the neck PSYCH: alert, well-oriented.  Does not appear anxious nor depressed.      Lab Results  Component Value Date   HGBA1C 9.6 03/09/2017   I have reviewed outside records, and summarized: Pt was noted to have elevated a1c, and referred here.  Other med probs addressed were urinary incont, knee pain, and wellness.      Assessment & Plan:  Type 2 DM, with CVA: she needs increased rx.  She requests to be managed without insulin, so we'll try.    Patient Instructions  good diet and exercise significantly improve the control of your diabetes.  please let me know if you wish to be referred to a dietician.  high blood sugar is very risky to your health.  you should see an eye doctor and dentist every year.  It is very important to get all recommended vaccinations.  Controlling your blood pressure and cholesterol drastically reduces the damage diabetes does to your body.  Those who smoke should quit.  Please discuss these with your doctor.  I have sent a prescription to your pharmacy, for the freestyle Challislibre sensors. I have also sent a prescription to your pharmacy, to add "tradjenta." Please call or message us next week, to tell us how the blood sugar is doing.  We would probably add "farxiga." We can add anothjer medication every week, as we need to.    Please come back for a follow-up appointment in 2 months.

## 2017-03-11 ENCOUNTER — Other Ambulatory Visit: Payer: Self-pay

## 2017-03-11 ENCOUNTER — Ambulatory Visit: Payer: BC Managed Care – PPO | Admitting: Family Medicine

## 2017-03-11 ENCOUNTER — Telehealth: Payer: Self-pay | Admitting: Endocrinology

## 2017-03-11 MED ORDER — DAPAGLIFLOZIN PROPANEDIOL 5 MG PO TABS: 5.0000 mg | ORAL_TABLET | Freq: Every day | ORAL | 11 refills | Status: DC

## 2017-03-11 NOTE — Telephone Encounter (Signed)
Patient notified that prescription was sent in.  

## 2017-03-11 NOTE — Telephone Encounter (Signed)
Patient is calling to give you her b/s readings °

## 2017-03-11 NOTE — Telephone Encounter (Signed)
Ok, I have sent a prescription to your pharmacy, to add "farxiga."

## 2017-03-11 NOTE — Telephone Encounter (Signed)
CBG's: The first day was with out new dosage:   AM: Noon: PM: 12/4  277 12/5  177 12/6 162 165

## 2017-03-18 NOTE — Telephone Encounter (Signed)
[facility] need [facility] patient last office notes Fax # [phone]

## 2017-03-18 NOTE — Telephone Encounter (Signed)
I have printed & faxed last progress notes.

## 2017-04-14 ENCOUNTER — Ambulatory Visit: Payer: BC Managed Care – PPO | Admitting: Neurology

## 2017-04-14 ENCOUNTER — Encounter: Payer: Self-pay | Admitting: Neurology

## 2017-04-14 ENCOUNTER — Encounter (INDEPENDENT_AMBULATORY_CARE_PROVIDER_SITE_OTHER): Payer: Self-pay

## 2017-04-14 VITALS — BP 145/88 | HR 89 | Ht 62.5 in | Wt 124.5 lb

## 2017-04-14 DIAGNOSIS — R269 Unspecified abnormalities of gait and mobility: Secondary | ICD-10-CM | POA: Diagnosis not present

## 2017-04-14 MED ORDER — DULOXETINE HCL 60 MG PO CPEP: 60.0000 mg | ORAL_CAPSULE | Freq: Every day | ORAL | 11 refills | Status: DC
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
## 2017-04-15 ENCOUNTER — Ambulatory Visit: Payer: Self-pay | Admitting: Neurology

## 2017-05-11 LAB — COLOGUARD: Cologuard: NEGATIVE

## 2017-06-07 ENCOUNTER — Telehealth: Payer: Self-pay | Admitting: Endocrinology

## 2017-06-07 NOTE — Telephone Encounter (Signed)
Patient was experiencing pain and [place]. Dr. Everardo AllEllison prescribed 2 medications. One was EcuadorFerxiga that went fine. After that was finished Dr. Everardo AllEllison prescribed Tradjenta-1 tblet by mouth daily (after dinner). 1 day after starting Tradjenta-next day pt experienced tremendous pain on left side, she did not stop the medication. On Thursday (06/03/17) she stopped the Tradjenta. On Saturday 06/05/17 she went to ER-seen by Dr. Vira BlancoLee-he told her there is inflammation on her pelvic side-he gave her medication for the inflammation and the nausea. Since she stopped taking Tradhenta she no longer had nausea-so she did not take medication for nausea. ER advised pt to call and let Dr. Everardo AllEllison know what is going on. Pt is continuing on medication for inflammation. While pt was on Tradjenta she was fine in the morning-but after dinner when she took Franceradjenta she experienced extreme nausea and pain. ER Dr did not know why that is when she experienced symptoms-that she would have to talk to Dr. Everardo AllEllison. Please call pt at ph# [phone] to advise.

## 2017-06-08 ENCOUNTER — Telehealth: Payer: Self-pay | Admitting: Endocrinology

## 2017-06-08 MED ORDER — SITAGLIPTIN PHOSPHATE 100 MG PO TABS: 100.0000 mg | ORAL_TABLET | Freq: Every day | ORAL | 3 refills | Status: DC

## 2017-06-08 NOTE — Telephone Encounter (Signed)
I have sent a prescription to your pharmacy, to change to januvia 

## 2017-06-08 NOTE — Telephone Encounter (Signed)
I did another message like this.  Please ask pt: does this address her questions?

## 2017-06-08 NOTE — Telephone Encounter (Signed)
Patient had to go Saturday Clinic to Dr. Zachery DauerBarnes office she she was so ill. Dr. Nedra HaiLee saw her. She thinks it may be related to the medication change She was first taking JapanFarciga then was changed to Tradjenta 5 mg. Dr. Nedra HaiLee gave her medication for inflammation and told her to call Dr. Everardo AllEllison as soon as she woke up on 06/07/17. She saw PCP today-told her to call Dr. Everardo AllEllison. Please call pt at ph# [phone] to advise. Pt has stopped taking Tradjenta.

## 2017-06-08 NOTE — Telephone Encounter (Signed)
Please advise if patient should d/c tradjenta & should she take alternative?

## 2017-06-09 NOTE — Telephone Encounter (Signed)
Patient was notified new prescription for Januvia was sent in.

## 2017-06-09 NOTE — Telephone Encounter (Signed)
I called & informed patient that Januvia was sent to her pharmacy to replace Tradjenta.

## 2017-07-19 ENCOUNTER — Ambulatory Visit: Payer: BC Managed Care – PPO | Admitting: Endocrinology

## 2017-10-04 ENCOUNTER — Other Ambulatory Visit: Payer: Self-pay | Admitting: Family Medicine

## 2017-10-04 DIAGNOSIS — N83202 Unspecified ovarian cyst, left side: Secondary | ICD-10-CM

## 2017-11-07 IMAGING — US IR CHOLECYSTOSTOMY
1 series · 2 of 2 positions shown · non-contrast
Comparison: CT abdomen and pelvis - 03/12/2016

INDICATION: Acute cholecystitis

EXAM:
ULTRASOUND AND FLUOROSCOPIC-GUIDED CHOLECYSTOSTOMY TUBE PLACEMENT

[Series 1: ir (id) (id)/(id)/(id) ir · 2 of 2 slices shown]
[im 1/2]
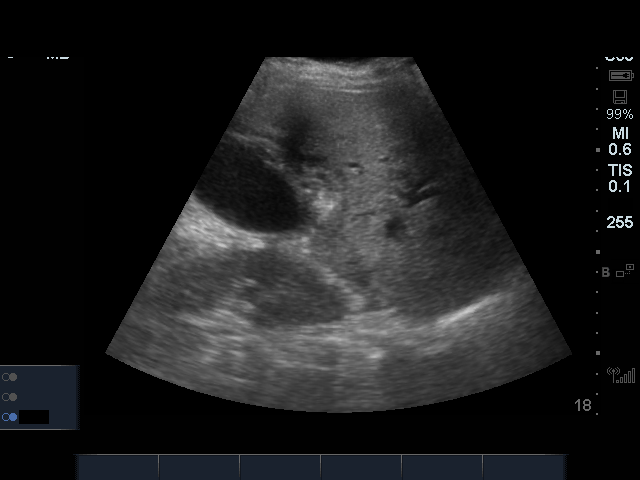
[im 2/2]
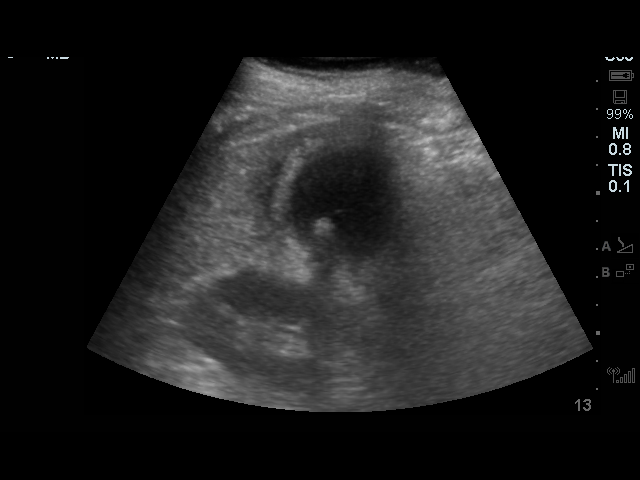

[2 of 2 positions shown; findings below may reference images not displayed]

MEDICATIONS:
The patient is currently admitted to the hospital and on intravenous
antibiotics. Antibiotics were administered within an appropriate
time frame prior to skin puncture.

ANESTHESIA/SEDATION:
Moderate (conscious) sedation was employed during this procedure. A
total of Versed 2 mg and Fentanyl 50 mcg was administered
intravenously.

Moderate Sedation Time: 10 minutes. The patient's level of
consciousness and vital signs were monitored continuously by
radiology nursing throughout the procedure under my direct
supervision.

CONTRAST:  10mL EBJHCT-R22 IOPAMIDOL (EBJHCT-R22) INJECTION 61% -
administered into the gallbladder fossa.

FLUOROSCOPY TIME:  36 seconds (7 mGy)

COMPLICATIONS:
None immediate.

PROCEDURE:
Informed written consent was obtained from the patient after a
discussion of the risks, benefits and alternatives to treatment.
Questions regarding the procedure were encouraged and answered. A
timeout was performed prior to the initiation of the procedure.

The right upper abdominal quadrant was prepped and draped in the
usual sterile fashion, and a sterile drape was applied covering the
operative field. Maximum barrier sterile technique with sterile
gowns and gloves were used for the procedure. A timeout was
performed prior to the initiation of the procedure. Local anesthesia
was provided with 1% lidocaine with epinephrine.

Ultrasound scanning of the right upper quadrant demonstrates a
markedly dilated gallbladder. Of note, the patient reported pain
with ultrasound imaging over the gallbladder. Utilizing a
transhepatic approach, a 22 gauge needle was advanced into the
gallbladder under direct ultrasound guidance. An ultrasound image
was saved for documentation purposes. Appropriate intraluminal
puncture was confirmed with the efflux of bile and advancement of an
0.018 wire into the gallbladder lumen. The needle was exchanged for
an Accustick set. A small amount of contrast was injected to confirm
appropriate intraluminal positioning. Over a Benson wire, a
10.2-Labelle Salha Brack Tiger cholecystomy tube was advanced into the gallbladder
fossa, coiled and locked. A small amount of purulent appearing bile
was aspirated, capped and sent to the laboratory for analysis. A
small amount of contrast was injected as several post procedural
spot radiographic images were obtained in various obliquities. The
catheter was secured to the skin with suture, connected to a
drainage bag and a dressing was placed. The patient tolerated the
procedure well without immediate post procedural complication.
IMPRESSION: Successful ultrasound and fluoroscopic guided placement of a
French cholecystostomy tube. A small sample of purulent appearing
bile was capped and sent to the laboratory for analysis.

## 2018-02-03 NOTE — Progress Notes (Signed)
Cardiology Office Note:    Date:  02/04/2018   ID:  Marie [name], DOB [birth year], MRN [id number]  PCP:  Marie Rainier, MD  Cardiologist:  Norman Herrlich, MD    Referring MD: Marie Rainier, MD    ASSESSMENT:    1. History of CVA (cerebrovascular accident) (multiple, recurrent)   2. Hypertensive heart disease without heart failure    PLAN:    In order of problems listed above:  1. Stable continue her current treatment she is had no indication of underlying atrial fibrillation loop recorder is depleted she is interested in buying attachment for the apple watch to continuously monitor rhythm and I gave her information 2. Stable continue ACE inhibitor and CCB 3. Diabetes and dyslipidemia are treated stable managed by PCP   Next appointment: As needed   Medication Adjustments/Labs and Tests Ordered: Current medicines are reviewed at length with the patient today.  Concerns regarding medicines are outlined above.  Orders Placed This Encounter  Procedures  . EKG 12-Lead   No orders of the defined types were placed in this encounter.   No chief complaint on file.   History of Present Illness:    Marie [name] is a 62 y.o. female with a hx of hypertension, stroke, carotid stenosis, T2 DM and dyslipidemia last seen 10/06/2016.Marie [name] He had an ILR inserted 11/29/13.  Most recent labs include cholesterol 115 LDL 55 triglycerides 83 HDL 43 10/01/2016.  His most recent loop recorder download showed no episodes of atrial tachycardia atrial fibrillation no bradycardia no symptomatic events this was performed 08/06/2017. Compliance with diet, lifestyle and medications: Yes  She is a little bit upset than that her device was depleted battery and she felt she should have been found and contacted.  When she returned to the [place] she contacted [employer] and they were the ones that informed her I reviewed her last transmission in May during the entire life of the device she had no  episodes of arrhythmia or atrial fibrillation and it showed that there is a signal event of service eminence in February.  She seems comfortable at this time she asked me about having it removed and I told her I would not do it unless it was important for her cosmetically or she was having local pain.  She asked if she should have a new device and I told her I would not and that the yield is low after the initial insertion she is interested monitor with rhythm and will consider the adapter for the Apple Watch to measure her heart rhythm and I gave her information how to purchase on [employer].  I left further follow-up as needed.  Her hypertension is stable she will continue current treatment ACE inhibitor and diabetes and dyslipidemia treated.  On examination the device is palpable just under the surface of the skin in the area of the left chest costochondral junction there is no tenderness or skin breakdown Past Medical History:  Diagnosis Date  . CVA (cerebral vascular accident) (HCC) 2015  . Depression   . Diabetes (HCC)    t2  . Gallstones   . Hyperlipemia   . Hypertension   . Migraines     Past Surgical History:  Procedure Laterality Date  . CESAREAN SECTION     x2  . CHOLECYSTECTOMY N/A 05/25/2016   Procedure: LAPAROSCOPIC CHOLECYSTECTOMY WITH INTRAOPERATIVE CHOLANGIOGRAM;  Surgeon: Marie Murphy, MD;  Location: [facility];  Service: General;  Laterality: N/A;  . IR GENERIC HISTORICAL  03/13/2016  IR PERC CHOLECYSTOSTOMY 03/13/2016 Marie Come, MD [facility]INTERV RAD  . IR GENERIC HISTORICAL  04/28/2016   IR RADIOLOGIST EVAL & MGMT 04/28/2016 Marie Moan, MD [facility] INTER[facility] RAD  . LOOP RECORDER IMPLANT N/A 11/29/2013   Procedure: LOOP RECORDER IMPLANT;  Surgeon: Marie Maw, MD;  [facility] C[facility] LAB;  Service: Cardiovascular;  Laterality: N/A;  . TEE WITHOUT CARDIOVERSION N/A 08/24/2013   Procedure: TRANSESOPHAGEAL ECHOCARDIOGRAM (TEE);  Surgeon: Marie Fair, MD;  Location: [facility] ENDOSCOPY;   Service: Cardiovascular;  Laterality: N/A;    Current Medications: Current Meds  Medication Sig  . amLODipine (NORVASC) 5 MG tablet Take 5 mg by mouth daily.  Marie [name] aspirin EC 81 MG tablet Take 81 mg by mouth at bedtime.  Marie [name] atorvastatin (LIPITOR) 20 MG tablet Take 1 tablet (20 mg total) by mouth daily. (Patient taking differently: Take 20 mg by mouth at bedtime. )  . Cholecalciferol (VITAMIN D3) 3000 units TABS Take 3,000 Units by mouth at bedtime.  . Coenzyme Q10 (COQ10) 200 MG CAPS Take 200 mg by mouth at bedtime.  . Continuous Blood Gluc Sensor (FREESTYLE LIBRE 14 DAY SENSOR) MISC 1 Device by Does not apply route every 14 (fourteen) days.  . dapagliflozin propanediol (FARXIGA) 5 MG TABS tablet Take 5 mg by mouth daily.  . Ginkgo Biloba 500 MG CAPS Take 500 mg by mouth at bedtime.   Marie [name] glucose blood (ONETOUCH VERIO) test strip 1 each by Other route as needed for other. Use as instructed  . lisinopril (PRINIVIL,ZESTRIL) 40 MG tablet Take 40 mg by mouth at bedtime.  . metFORMIN (GLUCOPHAGE-XR) 500 MG 24 hr tablet Take 4 tablets (2,000 mg total) by mouth daily.  . Multiple Vitamin (MULTIVITAMIN WITH MINERALS) TABS tablet Take 1 tablet by mouth daily.  . Omega-3 Fatty Acids (OMEGA-3 FISH OIL) 1200 MG CAPS Take 1,200 mg by mouth at bedtime.     Allergies:   Penicillins and Other   Social History   Socioeconomic History  . Marital status: Married    Spouse name: Not on file  . Number of children: 3  . Years of education: MBA  . Highest education level: Not on file  Occupational History    Employer: [employer]    Comment: [employer]  Social Needs  . Financial resource strain: Not on file  . Food insecurity:    Worry: Not on file    Inability: Not on file  . Transportation needs:    Medical: Not on file    Non-medical: Not on file  Tobacco Use  . Smoking status: Never Smoker  . Smokeless tobacco: Never Used  Substance and Sexual Activity  . Alcohol use: No    Alcohol/week: 1.0  standard drinks    Types: 1 Standard drinks or equivalent per week    Comment: Social  . Drug use: No  . Sexual activity: Not on file  Lifestyle  . Physical activity:    Days per week: Not on file    Minutes per session: Not on file  . Stress: Not on file  Relationships  . Social connections:    Talks on phone: Not on file    Gets together: Not on file    Attends religious service: Not on file    Active member of club or organization: Not on file    Attends meetings of clubs or organizations: Not on file    Relationship status: Not on file  Other Topics Concern  . Not on file  Social  History Narrative   Patient lives at home with [name].   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           Family History: The patient's family history includes Diabetes in her maternal grandfather and mother; High blood pressure in her maternal grandfather and mother; Stomach cancer in her mother. ROS:   Please see the history of present illness.    All other systems reviewed and are negative.  EKGs/Labs/Other Studies Reviewed:    The following studies were reviewed today:  EKG:  EKG ordered today.  The ekg ordered today demonstrates SRTH normal  Recent Labs: No results found for requested labs within last 8760 hours.  Recent Lipid Panel No results found for: CHOL, TRIG, HDL, CHOLHDL, VLDL, LDLCALC, LDLDIRECT  Physical Exam:    VS:  BP 130/84 (BP Location: Right Arm, Patient Position: Sitting, Cuff Size: Normal)   Pulse 89   Ht 5' 1.5" (1.562 m)   Wt 118 lb 1.9 oz (53.6 kg)   SpO2 98%   BMI 21.96 kg/m     Wt Readings from Last 3 Encounters:  02/04/18 118 lb 1.9 oz (53.6 kg)  04/14/17 124 lb 8 oz (56.5 kg)  03/09/17 128 lb 6.4 oz (58.2 kg)     GEN:  Well nourished, well developed in no acute distress HEENT: Normal NECK: No JVD; No carotid bruits LYMPHATICS: No lymphadenopathy CARDIAC:  RRR, no murmurs, rubs, gallops RESPIRATORY:  Clear to auscultation without rales, wheezing or rhonchi  ABDOMEN: Soft, non-tender, non-distended MUSCULOSKELETAL:  No edema; No deformity  SKIN: Warm and dry NEUROLOGIC:  Alert and oriented x 3 PSYCHIATRIC:  Normal affect    Signed, Norman Herrlich, MD  02/04/2018 11:58 AM    [facility]

## 2018-02-04 ENCOUNTER — Encounter: Payer: Self-pay | Admitting: Cardiology

## 2018-02-04 ENCOUNTER — Ambulatory Visit: Payer: BC Managed Care – PPO | Admitting: Cardiology

## 2018-02-04 VITALS — BP 130/84 | HR 89 | Ht 61.5 in | Wt 118.1 lb

## 2018-02-04 DIAGNOSIS — Z8673 Personal history of transient ischemic attack (TIA), and cerebral infarction without residual deficits: Secondary | ICD-10-CM | POA: Diagnosis not present

## 2018-02-04 DIAGNOSIS — I119 Hypertensive heart disease without heart failure: Secondary | ICD-10-CM | POA: Diagnosis not present

## 2018-02-04 NOTE — Patient Instructions (Addendum)
Medication Instructions:  Your physician recommends that you continue on your current medications as directed. Please refer to the Current Medication list given to you today.  If you need a refill on your cardiac medications before your next appointment, please call your pharmacy.   Lab work: None  If you have labs (blood work) drawn today and your tests are completely normal, you will receive your results only by: [name] MyChart Message (if you have MyChart) OR . A paper copy in the mail If you have any lab test that is abnormal or we need to change your treatment, we will call you to review the results.  Testing/Procedures: You had an EKG today.   Follow-Up: At [facility], you and your health needs are our priority.  As part of our continuing mission to provide you with exceptional heart care, we have created designated Provider Care Teams.  These Care Teams include your primary Cardiologist (physician) and Advanced Practice Providers (APPs -  Physician Assistants and Nurse Practitioners) who all work together to provide you with the care you need, when you need it. You will need a follow up appointment as needed if symptoms worsen or fail to improve.           Introducing KardiaBand, the new wearable EKG by [employer]. Venetia Maxon replaces your original Apple Watch band. The first of its kind, FDA-cleared KardiaBand provides accurate and instant analysis for detecting atrial fibrillation (AF) and normal sinus rhythm in an EKG. Simply place your thumb on the integrated KardiaBand sensor to take a medical-grade EKG in just 30 seconds. Results appear instantly on your Apple Watch. Venetia Maxon is available today for just $199. KardiaBand features are designed exclusively for use with [employer] - $99 year. The [employer] for [employer] includes [employer]'s revolutionary SmartRhythm monitoring feature. SmartRhythm monitoring uses an intelligent neural network that runs  directly on the [employer], constantly acquiring data from the watch's heart rate sensor and its accelerometer. SmartRhythm compares your heart rate to what it expects from your minute-by-minute level of activity. When the network sees a pattern of heart rate and activity that it does not expect, it notifies you to take an EKG. With [facility], peace of mind is just an EKG away.  [name]

## 2018-02-14 ENCOUNTER — Ambulatory Visit
Admission: RE | Admit: 2018-02-14 | Discharge: 2018-02-14 | Disposition: A | Discharge status: Home / Self Care | Payer: BC Managed Care – PPO | Source: Ambulatory Visit | Attending: Family Medicine | Admitting: Family Medicine

## 2018-02-14 DIAGNOSIS — N83202 Unspecified ovarian cyst, left side: Secondary | ICD-10-CM

## 2018-02-17 ENCOUNTER — Telehealth: Payer: Self-pay | Admitting: Cardiology

## 2018-02-17 NOTE — Telephone Encounter (Signed)
Please call patient regarding the medicaiton that Dr Dulce SellarMunley prescribed. She has no clue what to purchase she states??

## 2018-02-17 NOTE — Telephone Encounter (Signed)
Left message to return call 

## 2018-02-21 NOTE — Telephone Encounter (Signed)
Left message for patient to return call.

## 2018-02-23 ENCOUNTER — Other Ambulatory Visit: Payer: Self-pay | Admitting: Family Medicine

## 2018-02-23 DIAGNOSIS — N83202 Unspecified ovarian cyst, left side: Secondary | ICD-10-CM

## 2018-03-18 ENCOUNTER — Other Ambulatory Visit: Payer: Self-pay | Admitting: Endocrinology

## 2018-03-18 NOTE — Telephone Encounter (Signed)
Please refill x 1 Ov is due  

## 2018-03-21 ENCOUNTER — Telehealth: Payer: Self-pay | Admitting: Endocrinology

## 2018-03-21 NOTE — Telephone Encounter (Signed)
[facility] "Caller needs a prescription refill. [facility]acy[facility]"

## 2018-03-21 NOTE — Telephone Encounter (Signed)
Called pt to ask what mediation needs to be refilled and has she contacted her pharmacy, but mailbox was full and could not leave a message

## 2018-04-13 ENCOUNTER — Other Ambulatory Visit: Payer: Self-pay | Admitting: Endocrinology

## 2018-04-21 ENCOUNTER — Telehealth: Payer: Self-pay | Admitting: Endocrinology

## 2018-04-21 ENCOUNTER — Telehealth: Payer: Self-pay

## 2018-04-21 NOTE — Telephone Encounter (Signed)
Called pt at her request. Mailbox is full and cannot accept incoming messages.

## 2018-04-21 NOTE — Telephone Encounter (Signed)
Patient is requesting a call back to discuss her medication. She needs to make a follow up appointment but stated she did not want to do this until she spoke with a nurse.    Please advise

## 2018-04-21 NOTE — Telephone Encounter (Signed)
Pt is now scheduled for an appt. On 05/09/17 Please order any labs you would like the pt to have before this appt.

## 2018-04-21 NOTE — Telephone Encounter (Signed)
Called pt and advised her of this. Pt verbalized understanding.

## 2018-04-21 NOTE — Telephone Encounter (Signed)
We can do labs when pt is here.

## 2018-04-28 ENCOUNTER — Telehealth: Payer: Self-pay | Admitting: *Deleted

## 2018-04-28 MED ORDER — AMLODIPINE BESYLATE 5 MG PO TABS: 5.0000 mg | ORAL_TABLET | Freq: Every day | ORAL | 5 refills | Status: DC

## 2018-04-28 MED ORDER — LISINOPRIL 40 MG PO TABS: 40.0000 mg | ORAL_TABLET | Freq: Every day | ORAL | 5 refills | Status: DC

## 2018-04-28 NOTE — Telephone Encounter (Signed)
*  STAT* If patient is at the pharmacy, call can be transferred to refill team.   1. Which medications need to be refilled? (please list name of each medication and dose if known) Lisinopril 40 mg, qd and Amlodipine 5 mg qd  2. Which pharmacy/location (including street and city if local pharmacy) is medication to be sent to?[facility] in [place]  3. Do they need a 30 day or 90 day supply? 30

## 2018-05-05 ENCOUNTER — Other Ambulatory Visit: Payer: BC Managed Care – PPO

## 2018-05-09 ENCOUNTER — Telehealth: Payer: Self-pay | Admitting: Endocrinology

## 2018-05-09 ENCOUNTER — Encounter: Payer: Self-pay | Admitting: Endocrinology

## 2018-05-09 ENCOUNTER — Other Ambulatory Visit: Payer: Self-pay

## 2018-05-09 ENCOUNTER — Ambulatory Visit: Payer: BC Managed Care – PPO | Admitting: Endocrinology

## 2018-05-09 VITALS — BP 118/70 | HR 98 | Ht 61.5 in | Wt 122.6 lb

## 2018-05-09 DIAGNOSIS — E11319 Type 2 diabetes mellitus with unspecified diabetic retinopathy without macular edema: Secondary | ICD-10-CM

## 2018-05-09 LAB — POCT GLYCOSYLATED HEMOGLOBIN (HGB A1C): Hemoglobin A1C: 7.7 % — AB (ref 4.0–5.6)

## 2018-05-09 MED ORDER — METFORMIN HCL ER 500 MG PO TB24: 2000.0000 mg | ORAL_TABLET | Freq: Every day | ORAL | 3 refills | Status: DC

## 2018-05-09 MED ORDER — SITAGLIPTIN PHOSPHATE 100 MG PO TABS: 100.0000 mg | ORAL_TABLET | Freq: Every day | ORAL | 3 refills | Status: DC

## 2018-05-09 MED ORDER — FREESTYLE LIBRE 14 DAY SENSOR MISC: 1.0000 | 3 refills | Status: DC

## 2018-05-09 NOTE — Patient Instructions (Addendum)
Please continue the same metformin and farxiga.  I have also sent a prescription to your pharmacy, to add "Januvia." Please come back for a follow-up appointment in 2 months.

## 2018-05-09 NOTE — Telephone Encounter (Signed)
Patient requests that a new RX for Metformin be sent to [facility] in [place] on [address] (medication used to be prescribed by a previous Dr.)

## 2018-05-09 NOTE — Progress Notes (Signed)
Subjective:    Patient ID: Marie [name], female    DOB: 12/18/1955, 63 y.o.   MRN: [id number]  HPI Pt returns for f/u of diabetes mellitus: DM type: Insulin-requiring type 2.  Dx'ed: 2010 Complications: polyneuropathy, CVA, and DR Therapy: 2 oral meds.  GDM: never DKA: never Severe hypoglycemia: never Pancreatitis: never Pancreatic imaging (2017) CT showed focal calcification in the pancreatic tail. Other: she has freestyle libre continuous glucose monitor; she has never been on insulin Interval history: She does not take Venezuelajanuvia.  I reviewed continuous glucose monitor data.  Glucose varies from 100-270.  It is in general higher as the day goes on Past Medical History:  Diagnosis Date  . CVA (cerebral vascular accident) (HCC) 2015  . Depression   . Diabetes (HCC)    t2  . Gallstones   . Hyperlipemia   . Hypertension   . Migraines     Past Surgical History:  Procedure Laterality Date  . CESAREAN SECTION     x2  . CHOLECYSTECTOMY N/A 05/25/2016   Procedure: LAPAROSCOPIC CHOLECYSTECTOMY WITH INTRAOPERATIVE CHOLANGIOGRAM;  Surgeon: Luretha MurphyMatthew Martin, MD;  Location[facility];  Service: General;  Laterality: N/A;  . IR GENERIC HISTORICAL  03/13/2016   IR PERC CHOLECYSTOSTOMY 03/13/2016 Simonne ComeJohn Watts, MD [facility]INTER[facility]  . IR GENERIC HISTORICAL  04/28/2016   IR RADIOLOGIST EVAL & MGMT 04/28/2016 Malachy MoanHeath McCullough, MD [facility] INTER[facility] RAD  . LOOP RECORDER IMPLANT N/A 11/29/2013   Procedure: LOOP RECORDER IMPLANT;  Surgeon: Marinus MawGregg W Taylor, MD;  [facility];  Service: Cardiovascular;  Laterality: N/A;  . TEE WITHOUT CARDIOVERSION N/A 08/24/2013   Procedure: TRANSESOPHAGEAL ECHOCARDIOGRAM (TEE);  Surgeon: Thurmon FairMihai Croitoru, MD;  [facility];  Service: Cardiovascular;  Laterality: N/A;    Social History   Socioeconomic History  . Marital status: Married    Spouse name: Not on file  . Number of children: 3  . Years of education: MBA  . Highest education level: Not on file    Occupational History    Employer: [employer]    Comment: [employer]  Social Needs  . Financial resource strain: Not on file  . Food insecurity:    Worry: Not on file    Inability: Not on file  . Transportation needs:    Medical: Not on file    Non-medical: Not on file  Tobacco Use  . Smoking status: Never Smoker  . Smokeless tobacco: Never Used  Substance and Sexual Activity  . Alcohol use: No    Alcohol/week: 1.0 standard drinks    Types: 1 Standard drinks or equivalent per week    Comment: Social  . Drug use: No  . Sexual activity: Not on file  Lifestyle  . Physical activity:    Days per week: Not on file    Minutes per session: Not on file  . Stress: Not on file  Relationships  . Social connections:    Talks on phone: Not on file    Gets together: Not on file    Attends religious service: Not on file    Active member of club or organization: Not on file    Attends meetings of clubs or organizations: Not on file    Relationship status: Not on file  . Intimate partner violence:    Fear of current or ex partner: Not on file    Emotionally abused: Not on file    Physically abused: Not on file    Forced sexual activity: Not on file  Other Topics Concern  . Not on file  Social History Narrative   Patient lives at home with [name].   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          Current Outpatient Medications on File Prior to Visit  Medication Sig Dispense Refill  . amLODipine (NORVASC) 5 MG tablet Take 1 tablet (5 mg total) by mouth daily. 30 tablet 5  . aspirin EC 81 MG tablet Take 81 mg by mouth at bedtime.    [name] atorvastatin (LIPITOR) 20 MG tablet Take 1 tablet (20 mg total) by mouth daily. (Patient taking differently: Take 20 mg by mouth at bedtime. ) 90 tablet 3  . Cholecalciferol (VITAMIN D3) 3000 units TABS Take 3,000 Units by mouth at bedtime.    . Coenzyme Q10  (COQ10) 200 MG CAPS Take 200 mg by mouth at bedtime.    [name] FARXIGA 5 MG TABS tablet TAKE 1 TABLET BY MOUTH DAILY 30 tablet 0  . Ginkgo Biloba 500 MG CAPS Take 500 mg by mouth at bedtime.     [name] glucose blood (ONETOUCH VERIO) test strip 1 each by Other route as needed for other. Use as instructed    . lisinopril (PRINIVIL,ZESTRIL) 40 MG tablet Take 1 tablet (40 mg total) by mouth at bedtime. 30 tablet 5  . Multiple Vitamin (MULTIVITAMIN WITH MINERALS) TABS tablet Take 1 tablet by mouth daily.    . Omega-3 Fatty Acids (OMEGA-3 FISH OIL) 1200 MG CAPS Take 1,200 mg by mouth at bedtime.     No current facility-administered medications on file prior to visit.     Allergies  Allergen Reactions  . Penicillins Other (See Comments)    Driving couldn't see and head felt heavy  Has patient had a PCN reaction causing immediate rash, facial/tongue/throat swelling, SOB or lightheadedness with hypotension: No Has patient had a PCN reaction causing severe rash involving mucus membranes or skin necrosis: No Has patient had a PCN reaction that required hospitalization: No Has patient had a PCN reaction occurring within the last 10 years: No If all of the above answers are "NO", then may proceed with Cephalosporin use.   . Other Other (See Comments)    Allergic to beef and eggplant causes rash on skin    Family History  Problem Relation Age of Onset  . Stomach cancer Mother   . High blood pressure Mother   . Diabetes Mother   . Diabetes Maternal Grandfather   . High blood pressure Maternal Grandfather     BP 118/70 (BP Location: Left Arm, Patient Position: Sitting, Cuff Size: Normal)   Pulse 98   Ht 5' 1.5" (1.562 m)   Wt 122 lb 9.6 oz (55.6 kg)   SpO2 97%   BMI 22.79 kg/m   Review of Systems She denies hypoglycemia    Objective:   Physical Exam VITAL SIGNS:  See vs page GENERAL: no distress Pulses: dorsalis pedis intact bilat.   MSK: no deformity of the feet CV: no leg edema Skin:  no  ulcer on the feet.  normal color and temp on the feet. Neuro: sensation is intact to touch on the feet  Lab Results  Component Value Date   HGBA1C 7.7 (A) 05/09/2018    Lab Results  Component Value Date   CREATININE 0.80 05/25/2016   BUN 18 05/21/2016   NA 145 05/21/2016  K 4.8 05/21/2016   CL 108 05/21/2016   CO2 26 05/21/2016       Assessment & Plan:  Type 2 DM, with CVA's: she needs increased rx.    Patient Instructions  Please continue the same metformin and farxiga.  I have also sent a prescription to your pharmacy, to add "Januvia." Please come back for a follow-up appointment in 2 months.

## 2018-05-09 NOTE — Telephone Encounter (Signed)
metFORMIN (GLUCOPHAGE-XR) 500 MG 24 hr tablet 360 tablet 3 05/09/2018    Sig - Route: Take 4 tablets (2,000 mg total) by mouth daily. - Oral   Sent to pharmacy as: metFORMIN (GLUCOPHAGE-XR) 500 MG 24 hr tablet   E-Prescribing Status: Sent to pharmacy (05/09/2018 12:51 PM EST)

## 2018-05-10 ENCOUNTER — Telehealth: Payer: Self-pay | Admitting: Endocrinology

## 2018-05-10 NOTE — Telephone Encounter (Signed)
Please do PA    thanks

## 2018-05-10 NOTE — Telephone Encounter (Signed)
Patient called re: patient is needlephobic (deep fear of needles). Patient requests RX with a PA for Freestyle Libre 14 day Sensor sent to Pharmacy is [facility] in [place] ph# [phone]/[email or web address] [facility]. If questions or problems please call patient.

## 2018-05-10 NOTE — Telephone Encounter (Signed)
Continuous Blood Gluc Sensor (FREESTYLE LIBRE 14 DAY SENSOR) MISC 6 each 3 05/09/2018    Sig - Route: 1 Device by Does not apply route every 14 (fourteen) days. - Does not apply   Sent to pharmacy as: Continuous Blood Gluc Sensor (FREESTYLE LIBRE 14 DAY SENSOR) Misc   E-Prescribing Status: Receipt confirmed by pharmacy (05/09/2018 11:58 AM EST)    This Rx has already been sent. Does this patient meet criteria to proceed with a PA?

## 2018-05-11 ENCOUNTER — Telehealth: Payer: Self-pay | Admitting: Endocrinology

## 2018-05-11 NOTE — Telephone Encounter (Signed)
Pt states she has a phobia of needles and is interested in a CGM. PA was completed for [facility] but was denied. Insurance will cover Dexcom rather than [employer]. Per Dr. Everardo All, please call pt to further discuss. Have not yet proceeded with entering order for Dexcom until we know if she is interested in learning more about it AND which DME supplier she would prefer order be sent.

## 2018-05-11 NOTE — Telephone Encounter (Signed)
Jeneen Rinks Key: [id number] - PA  Case ID: [id number]  Need help? Call us at [phone]  Outcome: Denied today  Your PA request has been denied. Additional information will be provided in the denial communication. (Message 1140)  Drug: FreeStyle Libre 14 Day Sensor  Form: Ambulance person PA Form (NCPDP)

## 2018-05-11 NOTE — Telephone Encounter (Signed)
please call patient: Ins prefers dexcom continuous glucose monitor.  Do you want to have an appointment with Bonita Quin to start on this?

## 2018-05-11 NOTE — Telephone Encounter (Signed)
PA initiated today through Cover My Meds for Freestyle Sensor. Will await insurance response re: approval/denial. At the time of initiaiation of this request, [facility] indicated DEXCOM was an approved alternative for this pt plan. Pt insisted this would be covered by her plan, so PA was completed.   Jeneen Rinks Key: [id number] - PA Case ID: [id number]  Need help? Call us at [phone]  Status: Sent to Plan today  Drug: FreeStyle Libre 14 Day Sensor  Form: Ambulance person PA Form (NCPDP)

## 2018-05-12 NOTE — Telephone Encounter (Signed)
I phoned her, and discussed the Dexcom.  She gave me a verbal ok to sign her name to the insurance investigation form.  I filled it out and faxed it to [facility] so that she can determine the cost/coverage, before it is called in to the pharmacy.

## 2018-05-16 ENCOUNTER — Other Ambulatory Visit: Payer: Self-pay | Admitting: Family Medicine

## 2018-05-16 DIAGNOSIS — N83209 Unspecified ovarian cyst, unspecified side: Secondary | ICD-10-CM

## 2018-05-26 ENCOUNTER — Ambulatory Visit: Payer: BC Managed Care – PPO | Admitting: Family Medicine

## 2018-05-26 ENCOUNTER — Encounter: Payer: Self-pay | Admitting: Family Medicine

## 2018-05-26 VITALS — BP 124/80 | HR 91 | Ht 61.5 in | Wt 128.2 lb

## 2018-05-26 DIAGNOSIS — I672 Cerebral atherosclerosis: Secondary | ICD-10-CM | POA: Diagnosis not present

## 2018-05-26 DIAGNOSIS — E782 Mixed hyperlipidemia: Secondary | ICD-10-CM

## 2018-05-26 DIAGNOSIS — Z8673 Personal history of transient ischemic attack (TIA), and cerebral infarction without residual deficits: Secondary | ICD-10-CM

## 2018-05-26 DIAGNOSIS — N838 Other noninflammatory disorders of ovary, fallopian tube and broad ligament: Secondary | ICD-10-CM | POA: Diagnosis not present

## 2018-05-26 DIAGNOSIS — I119 Hypertensive heart disease without heart failure: Secondary | ICD-10-CM

## 2018-05-26 DIAGNOSIS — E11319 Type 2 diabetes mellitus with unspecified diabetic retinopathy without macular edema: Secondary | ICD-10-CM | POA: Diagnosis not present

## 2018-05-26 NOTE — Progress Notes (Signed)
Established Patient Office Visit  Subjective:  Patient ID: Marie [name], female    DOB: 06/25/1955  Age: 63 y.o. MRN: [id number]  CC:  Chief Complaint  Patient presents with  . Establish Care    HPI Marie [name] presents for establishment of care by way of transfer.  Her office is closer to where she lives.  Significant past medical history of type 2 diabetes.  Currently treated with oral therapy.  She is seeing an endocrinologist for her diabetes care at this point.  She is seeing an eye doctor regularly for diabetic retinopathy.  She had seen cardiology in the past and the loop recorder was installed.  It is no longer functional.  History of CVA x2 the last of which was back in 2017.  Patient's blood pressure is controlled with lisinopril.  She is also taking Lipitor and aspirin for treatment of hyperlipidemia and prevention.  A pelvic ultrasound has been ordered by her prior physician for follow-up of left ovarian enlargement.  Past Medical History:  Diagnosis Date  . CVA (cerebral vascular accident) (HCC) 2015  . Depression   . Diabetes (HCC)    t2  . Gallstones   . Hyperlipemia   . Hypertension   . Migraines     Past Surgical History:  Procedure Laterality Date  . CESAREAN SECTION     x2  . CHOLECYSTECTOMY N/A 05/25/2016   Procedure: LAPAROSCOPIC CHOLECYSTECTOMY WITH INTRAOPERATIVE CHOLANGIOGRAM;  Surgeon: Luretha MurphyMatthew Martin, MD;  Location: [facility];  Service: General;  Laterality: N/A;  . IR GENERIC HISTORICAL  03/13/2016   IR PERC CHOLECYSTOSTOMY 03/13/2016 Simonne ComeJohn Watts, MD [facility]  . IR GENERIC HISTORICAL  04/28/2016   IR RADIOLOGIST EVAL & MGMT 04/28/2016 Malachy MoanHeath McCullough, MD [facility]  . LOOP RECORDER IMPLANT N/A 11/29/2013   Procedure: LOOP RECORDER IMPLANT;  Surgeon: Marinus MawGregg W Taylor, MD;  [facility];  Service: Cardiovascular;  Laterality: N/A;  . TEE WITHOUT CARDIOVERSION N/A 08/24/2013   Procedure: TRANSESOPHAGEAL ECHOCARDIOGRAM (TEE);  Surgeon: Thurmon FairMihai  Croitoru, MD;  [facility];  Service: Cardiovascular;  Laterality: N/A;    Family History  Problem Relation Age of Onset  . Stomach cancer Mother   . High blood pressure Mother   . Diabetes Mother   . Diabetes Maternal Grandfather   . High blood pressure Maternal Grandfather     Social History   Socioeconomic History  . Marital status: Married    Spouse name: Not on file  . Number of children: 3  . Years of education: MBA  . Highest education level: Not on file  Occupational History    Employer: [employer]    Comment: [employer]  Social Needs  . Financial resource strain: Not on file  . Food insecurity:    Worry: Not on file    Inability: Not on file  . Transportation needs:    Medical: Not on file    Non-medical: Not on file  Tobacco Use  . Smoking status: Never Smoker  . Smokeless tobacco: Never Used  Substance and Sexual Activity  . Alcohol use: No    Alcohol/week: 1.0 standard drinks    Types: 1 Standard drinks or equivalent per week    Comment: Social  . Drug use: No  . Sexual activity: Not on file  Lifestyle  . Physical activity:    Days per week: Not on file    Minutes per session: Not on file  . Stress: Not on file  Relationships  .  Social connections:    Talks on phone: Not on file    Gets together: Not on file    Attends religious service: Not on file    Active member of club or organization: Not on file    Attends meetings of clubs or organizations: Not on file    Relationship status: Not on file  . Intimate partner violence:    Fear of current or ex partner: Not on file    Emotionally abused: Not on file    Physically abused: Not on file    Forced sexual activity: Not on file  Other Topics Concern  . Not on file  Social History Narrative   Patient lives at home with her husband (Marie [name]).   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          Outpatient  Medications Prior to Visit  Medication Sig Dispense Refill  . amLODipine (NORVASC) 5 MG tablet Take 1 tablet (5 mg total) by mouth daily. 30 tablet 5  . aspirin EC 81 MG tablet Take 81 mg by mouth at bedtime.    [name] atorvastatin (LIPITOR) 20 MG tablet Take 1 tablet (20 mg total) by mouth daily. (Patient taking differently: Take 20 mg by mouth at bedtime. ) 90 tablet 3  . Cholecalciferol (VITAMIN D3) 3000 units TABS Take 3,000 Units by mouth at bedtime.    . Coenzyme Q10 (COQ10) 200 MG CAPS Take 200 mg by mouth at bedtime.    . Continuous Blood Gluc Sensor (FREESTYLE LIBRE 14 DAY SENSOR) MISC 1 Device by Does not apply route every 14 (fourteen) days. 6 each 3  . FARXIGA 5 MG TABS tablet TAKE 1 TABLET BY MOUTH DAILY 30 tablet 0  . Ginkgo Biloba 500 MG CAPS Take 500 mg by mouth at bedtime.     [name] glucose blood (ONETOUCH VERIO) test strip 1 each by Other route as needed for other. Use as instructed    . lisinopril (PRINIVIL,ZESTRIL) 40 MG tablet Take 1 tablet (40 mg total) by mouth at bedtime. 30 tablet 5  . metFORMIN (GLUCOPHAGE-XR) 500 MG 24 hr tablet Take 4 tablets (2,000 mg total) by mouth daily. 360 tablet 3  . Multiple Vitamin (MULTIVITAMIN WITH MINERALS) TABS tablet Take 1 tablet by mouth daily.    . Omega-3 Fatty Acids (OMEGA-3 FISH OIL) 1200 MG CAPS Take 1,200 mg by mouth at bedtime.    . sitaGLIPtin (JANUVIA) 100 MG tablet Take 1 tablet (100 mg total) by mouth daily. 90 tablet 3   No facility-administered medications prior to visit.     Allergies  Allergen Reactions  . Penicillins Other (See Comments)    Driving couldn't see and head felt heavy  Has patient had a PCN reaction causing immediate rash, facial/tongue/throat swelling, SOB or lightheadedness with hypotension: No Has patient had a PCN reaction causing severe rash involving mucus membranes or skin necrosis: No Has patient had a PCN reaction that required hospitalization: No Has patient had a PCN reaction occurring within the  last 10 years: No If all of the above answers are "NO", then may proceed with Cephalosporin use.   . Other Other (See Comments)    Allergic to beef and eggplant causes rash on skin    ROS Review of Systems  Constitutional: Negative.   HENT: Negative.   Respiratory: Negative.   Cardiovascular: Negative.   Gastrointestinal: Negative.   Neurological:  Negative.   Hematological: Does not bruise/bleed easily.  Psychiatric/Behavioral: Negative.       Objective:    Physical Exam  Constitutional: She is oriented to person, place, and time. She appears well-developed and well-nourished. No distress.  HENT:  Head: Normocephalic and atraumatic.  Right Ear: External ear normal.  Left Ear: External ear normal.  Eyes: Pupils are equal, round, and reactive to light. Conjunctivae are normal. Right eye exhibits no discharge. Left eye exhibits no discharge. No scleral icterus.  Neck: Neck supple. No JVD present. No tracheal deviation present. No thyromegaly present.  Cardiovascular: Normal rate, regular rhythm and normal heart sounds.  Pulmonary/Chest: Effort normal and breath sounds normal. No stridor.  Lymphadenopathy:    She has no cervical adenopathy.  Neurological: She is alert and oriented to person, place, and time.  Skin: Skin is warm and dry. She is not diaphoretic.  Psychiatric: She has a normal mood and affect. Her behavior is normal.    BP 124/80   Pulse 91   Ht 5' 1.5" (1.562 m)   Wt 128 lb 4 oz (58.2 kg)   SpO2 98%   BMI 23.84 kg/m  Wt Readings from Last 3 Encounters:  05/26/18 128 lb 4 oz (58.2 kg)  05/09/18 122 lb 9.6 oz (55.6 kg)  02/04/18 118 lb 1.9 oz (53.6 kg)   BP Readings from Last 3 Encounters:  05/26/18 124/80  05/09/18 118/70  02/04/18 130/84   Guideline developer:  UpToDate (see UpToDate for funding source) Date Released: June 2014  Health Maintenance Due  Topic Date Due  . Hepatitis C Screening  Oct 28, 1955  . PNEUMOCOCCAL POLYSACCHARIDE VACCINE  AGE 53-64 HIGH RISK  05/27/1956  . OPHTHALMOLOGY EXAM  05/27/1964  . HIV Screening  05/27/1969  . TETANUS/TDAP  05/27/1973  . PAP SMEAR-Modifier  05/28/1975  . MAMMOGRAM  05/27/2004  . COLONOSCOPY  05/27/2004    There are no preventive care reminders to display for this patient.  Lab Results  Component Value Date   TSH 2.300 11/25/2015   Lab Results  Component Value Date   WBC 9.7 05/25/2016   HGB 12.5 05/25/2016   HCT 37.9 05/25/2016   MCV 83.7 05/25/2016   PLT 165 05/25/2016   Lab Results  Component Value Date   NA 145 05/21/2016   K 4.8 05/21/2016   CO2 26 05/21/2016   GLUCOSE 196 (H) 05/21/2016   BUN 18 05/21/2016   CREATININE 0.80 05/25/2016   BILITOT 0.5 05/21/2016   ALKPHOS 77 05/21/2016   AST 24 05/21/2016   ALT 25 05/21/2016   PROT 7.6 05/21/2016   ALBUMIN 4.2 05/21/2016   CALCIUM 9.7 05/21/2016   ANIONGAP 11 05/21/2016   No results found for: CHOL No results found for: HDL No results found for: LDLCALC No results found for: TRIG No results found for: CHOLHDL Lab Results  Component Value Date   HGBA1C 7.7 (A) 05/09/2018      Assessment & Plan:   Problem List Items Addressed This Visit      Cardiovascular and Mediastinum   Cerebral atherosclerosis - Primary (Chronic)   Relevant Orders   Ambulatory referral to Neurology   Hypertensive heart disease (Chronic)   Relevant Orders   Ambulatory referral to Cardiology     Endocrine   Diabetes with retinopathy (HCC) (Chronic)   Left ovarian enlargement     Other   Hyperlipidemia (Chronic)   History of CVA (cerebrovascular accident) (multiple, recurrent) (Chronic)   Relevant Orders   Ambulatory referral  to Neurology      No orders of the defined types were placed in this encounter.   Follow-up: Return in about 3 months (around 08/24/2018), or if symptoms worsen or fail to improve.

## 2018-05-30 ENCOUNTER — Other Ambulatory Visit: Payer: Self-pay | Admitting: Endocrinology

## 2018-06-01 ENCOUNTER — Other Ambulatory Visit: Payer: Self-pay | Admitting: Endocrinology

## 2018-06-01 ENCOUNTER — Ambulatory Visit
Admission: RE | Admit: 2018-06-01 | Discharge: 2018-06-01 | Disposition: A | Discharge status: Home / Self Care | Payer: BC Managed Care – PPO | Source: Ambulatory Visit | Attending: Family Medicine | Admitting: Family Medicine

## 2018-06-01 DIAGNOSIS — N83209 Unspecified ovarian cyst, unspecified side: Secondary | ICD-10-CM

## 2018-06-03 ENCOUNTER — Telehealth: Payer: Self-pay | Admitting: Family Medicine

## 2018-06-03 ENCOUNTER — Encounter: Payer: Self-pay | Admitting: Family Medicine

## 2018-06-03 ENCOUNTER — Ambulatory Visit: Payer: BC Managed Care – PPO | Admitting: Family Medicine

## 2018-06-03 VITALS — BP 120/66 | HR 115 | Temp 98.1°F | Ht 61.5 in | Wt 126.6 lb

## 2018-06-03 DIAGNOSIS — Z8673 Personal history of transient ischemic attack (TIA), and cerebral infarction without residual deficits: Secondary | ICD-10-CM | POA: Diagnosis not present

## 2018-06-03 DIAGNOSIS — Z789 Other specified health status: Secondary | ICD-10-CM

## 2018-06-03 DIAGNOSIS — E782 Mixed hyperlipidemia: Secondary | ICD-10-CM

## 2018-06-03 DIAGNOSIS — E11319 Type 2 diabetes mellitus with unspecified diabetic retinopathy without macular edema: Secondary | ICD-10-CM

## 2018-06-03 DIAGNOSIS — I119 Hypertensive heart disease without heart failure: Secondary | ICD-10-CM

## 2018-06-03 NOTE — Telephone Encounter (Signed)
Copied from CRM [id number]. Topic: Quick Communication - Rx Refill/Question >> Jun 03, 2018  4:40 PM Floria Raveling A wrote: Medication: atorvastatin (LIPITOR) 20 MG tablet [[id number]]-  pt stated that she thought that this med was already called in and was increased ?    Has the patient contacted their pharmacy? No. (Agent: If no, request that the patient contact the pharmacy for the refill.) (Agent: If yes, when and what did the pharmacy advise?)  Preferred Pharmacy (with phone number or street name): [id number] - [place], [place] - [address]  Agent: Please be advised that RX refills may take up to 3 business days. We ask that you follow-up with your pharmacy.

## 2018-06-03 NOTE — Progress Notes (Signed)
Established Patient Office Visit  Subjective:  Patient ID: Marie [name], female    DOB: Jul 20, 1955  Age: 63 y.o. MRN: [id number]  CC:  Chief Complaint  Patient presents with  . Results    She is here today to discuss results from U/S from [facility].    HPI Marie [name] presents for discussion of a pelvic ultrasound ordered by her prior provider that failed to show her right and left ovary.  Patient is postmenopausal and denies any pain in her lower abdomen.  Scheduled to see endocrinologist for treatment of her diabetes at the end of March.  Past Medical History:  Diagnosis Date  . CVA (cerebral vascular accident) (HCC) 2015  . Depression   . Diabetes (HCC)    t2  . Gallstones   . Hyperlipemia   . Hypertension   . Migraines     Past Surgical History:  Procedure Laterality Date  . CESAREAN SECTION     x2  . CHOLECYSTECTOMY N/A 05/25/2016   Procedure: LAPAROSCOPIC CHOLECYSTECTOMY WITH INTRAOPERATIVE CHOLANGIOGRAM;  Surgeon: Marie Murphy, MD;  [facility];  Service: General;  Laterality: N/A;  . IR GENERIC HISTORICAL  03/13/2016   IR PERC CHOLECYSTOSTOMY 03/13/2016 Marie Come, MD [facility]  . IR GENERIC HISTORICAL  04/28/2016   IR RADIOLOGIST EVAL & MGMT 04/28/2016 Marie Moan, MD [facility]  . LOOP RECORDER IMPLANT N/A 11/29/2013   Procedure: LOOP RECORDER IMPLANT;  Surgeon: Marinus Maw, MD;  Location: [facility] CATH LAB;  Service: Cardiovascular;  Laterality: N/A;  . TEE WITHOUT CARDIOVERSION N/A 08/24/2013   Procedure: TRANSESOPHAGEAL ECHOCARDIOGRAM (TEE);  Surgeon: Marie Fair, MD;  [facility];  Service: Cardiovascular;  Laterality: N/A;    Family History  Problem Relation Age of Onset  . Stomach cancer Mother   . High blood pressure Mother   . Diabetes Mother   . Diabetes Maternal Grandfather   . High blood pressure Maternal Grandfather     Social History   Socioeconomic History  . Marital status: Married    Spouse name:  Not on file  . Number of children: 3  . Years of education: MBA  . Highest education level: Not on file  Occupational History    Employer: [employer]    Comment: [employer]  Social Needs  . Financial resource strain: Not on file  . Food insecurity:    Worry: Not on file    Inability: Not on file  . Transportation needs:    Medical: Not on file    Non-medical: Not on file  Tobacco Use  . Smoking status: Never Smoker  . Smokeless tobacco: Never Used  Substance and Sexual Activity  . Alcohol use: No    Alcohol/week: 1.0 standard drinks    Types: 1 Standard drinks or equivalent per week    Comment: Social  . Drug use: No  . Sexual activity: Not on file  Lifestyle  . Physical activity:    Days per week: Not on file    Minutes per session: Not on file  . Stress: Not on file  Relationships  . Social connections:    Talks on phone: Not on file    Gets together: Not on file    Attends religious service: Not on file    Active member of club or organization: Not on file    Attends meetings of clubs or organizations: Not on file    Relationship status: Not on file  . Intimate partner violence:  Fear of current or ex partner: Not on file    Emotionally abused: Not on file    Physically abused: Not on file    Forced sexual activity: Not on file  Other Topics Concern  . Not on file  Social History Narrative   Patient lives at home with her husband (Marie [name]).   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          Outpatient Medications Prior to Visit  Medication Sig Dispense Refill  . amLODipine (NORVASC) 5 MG tablet Take 1 tablet (5 mg total) by mouth daily. 30 tablet 5  . aspirin EC 81 MG tablet Take 81 mg by mouth at bedtime.    [name] atorvastatin (LIPITOR) 20 MG tablet Take 1 tablet (20 mg total) by mouth daily. (Patient taking differently: Take 20 mg by mouth at bedtime. ) 90 tablet 3  .  Cholecalciferol (VITAMIN D3) 3000 units TABS Take 3,000 Units by mouth at bedtime.    . Coenzyme Q10 (COQ10) 200 MG CAPS Take 200 mg by mouth at bedtime.    . Continuous Blood Gluc Sensor (FREESTYLE LIBRE 14 DAY SENSOR) MISC 1 Device by Does not apply route every 14 (fourteen) days. 6 each 3  . FARXIGA 5 MG TABS tablet TAKE 1 TABLET BY MOUTH DAILY 30 tablet 0  . Ginkgo Biloba 500 MG CAPS Take 500 mg by mouth at bedtime.     [name] glucose blood (ONETOUCH VERIO) test strip 1 each by Other route as needed for other. Use as instructed    . lisinopril (PRINIVIL,ZESTRIL) 40 MG tablet Take 1 tablet (40 mg total) by mouth at bedtime. 30 tablet 5  . metFORMIN (GLUCOPHAGE-XR) 500 MG 24 hr tablet Take 4 tablets (2,000 mg total) by mouth daily. 360 tablet 3  . Multiple Vitamin (MULTIVITAMIN WITH MINERALS) TABS tablet Take 1 tablet by mouth daily.    . Omega-3 Fatty Acids (OMEGA-3 FISH OIL) 1200 MG CAPS Take 1,200 mg by mouth at bedtime.    . sitaGLIPtin (JANUVIA) 100 MG tablet Take 1 tablet (100 mg total) by mouth daily. 90 tablet 3   No facility-administered medications prior to visit.     Allergies  Allergen Reactions  . Penicillins Other (See Comments)    Driving couldn't see and head felt heavy  Has patient had a PCN reaction causing immediate rash, facial/tongue/throat swelling, SOB or lightheadedness with hypotension: No Has patient had a PCN reaction causing severe rash involving mucus membranes or skin necrosis: No Has patient had a PCN reaction that required hospitalization: No Has patient had a PCN reaction occurring within the last 10 years: No If all of the above answers are "NO", then may proceed with Cephalosporin use.   . Other Other (See Comments)    Allergic to beef and eggplant causes rash on skin    ROS Review of Systems  Respiratory: Negative.   Cardiovascular: Negative.   Gastrointestinal: Negative.  Negative for abdominal distention, abdominal pain and anal bleeding.    Genitourinary: Negative.  Negative for menstrual problem, pelvic pain, vaginal bleeding and vaginal discharge.  Psychiatric/Behavioral: Negative.       Objective:    Physical Exam  Constitutional: She is oriented to person, place, and time. She appears well-developed and well-nourished. No distress.  HENT:  Head: Normocephalic and atraumatic.  Right Ear: External ear normal.  Left Ear: External ear normal.  Eyes:  Conjunctivae are normal. Right eye exhibits no discharge. Left eye exhibits no discharge. No scleral icterus.  Neck: No JVD present. No tracheal deviation present.  Pulmonary/Chest: Effort normal. No stridor.  Neurological: She is alert and oriented to person, place, and time.  Skin: Skin is warm and dry. She is not diaphoretic.  Psychiatric: She has a normal mood and affect. Her behavior is normal.    BP 120/66 (BP Location: Left Arm, Patient Position: Sitting, Cuff Size: Normal)   Pulse (!) 115   Temp 98.1 F (36.7 C) (Oral)   Ht 5' 1.5" (1.562 m)   Wt 126 lb 9.6 oz (57.4 kg)   SpO2 98%   BMI 23.53 kg/m  Wt Readings from Last 3 Encounters:  06/03/18 126 lb 9.6 oz (57.4 kg)  05/26/18 128 lb 4 oz (58.2 kg)  05/09/18 122 lb 9.6 oz (55.6 kg)     Health Maintenance Due  Topic Date Due  . Hepatitis C Screening  [birth year]  . PNEUMOCOCCAL POLYSACCHARIDE VACCINE AGE 34-64 HIGH RISK  05/27/1956  . OPHTHALMOLOGY EXAM  05/27/1964  . HIV Screening  05/27/1969  . TETANUS/TDAP  05/27/1973  . PAP SMEAR-Modifier  05/28/1975  . MAMMOGRAM  05/27/2004  . COLONOSCOPY  05/27/2004    There are no preventive care reminders to display for this patient.  Lab Results  Component Value Date   TSH 2.300 11/25/2015   Lab Results  Component Value Date   WBC 9.7 05/25/2016   HGB 12.5 05/25/2016   HCT 37.9 05/25/2016   MCV 83.7 05/25/2016   PLT 165 05/25/2016   Lab Results  Component Value Date   NA 145 05/21/2016   K 4.8 05/21/2016   CO2 26 05/21/2016   GLUCOSE 196 (H)  05/21/2016   BUN 18 05/21/2016   CREATININE 0.80 05/25/2016   BILITOT 0.5 05/21/2016   ALKPHOS 77 05/21/2016   AST 24 05/21/2016   ALT 25 05/21/2016   PROT 7.6 05/21/2016   ALBUMIN 4.2 05/21/2016   CALCIUM 9.7 05/21/2016   ANIONGAP 11 05/21/2016   No results found for: CHOL No results found for: HDL No results found for: LDLCALC No results found for: TRIG No results found for: CHOLHDL Lab Results  Component Value Date   HGBA1C 7.7 (A) 05/09/2018      Assessment & Plan:   Problem List Items Addressed This Visit      Cardiovascular and Mediastinum   Hypertensive heart disease - Primary (Chronic)     Endocrine   Diabetes with retinopathy (HCC) (Chronic)     Other   Hyperlipidemia (Chronic)   History of CVA (cerebrovascular accident) (multiple, recurrent) (Chronic)   Health maintenance alteration   Relevant Orders   Ambulatory referral to Gynecology      No orders of the defined types were placed in this encounter.   Follow-up: Return in about 3 months (around 09/01/2018).   Suggested that I assume care for her diabetes, hypertension and elevated cholesterol.  Patient agrees.  She will follow-up in 3 months and we will check labs for her.  She will see a GYN provider to follow-up on her Pap and pelvic exams.   Mliss Sax, MD

## 2018-06-06 MED ORDER — ATORVASTATIN CALCIUM 20 MG PO TABS: 20.0000 mg | ORAL_TABLET | Freq: Every day | ORAL | 3 refills | Status: DC

## 2018-06-06 NOTE — Telephone Encounter (Signed)
Please advise 

## 2018-06-06 NOTE — Addendum Note (Signed)
Addended by: Andrez Grime on: 06/06/2018 03:43 PM   Modules accepted: Orders

## 2018-06-30 ENCOUNTER — Telehealth: Payer: Self-pay

## 2018-06-30 NOTE — Telephone Encounter (Signed)
Received a fax from [facility] that patient wanted to know if we were offering virtual visits. I informed patient that we are offering that for now in order to keep patient's at home and reduce the risk of COVID-19. Patient does not need to be seen now, but will use this service if needed before we start seeing patients in office again.

## 2018-07-04 ENCOUNTER — Ambulatory Visit: Payer: BC Managed Care – PPO | Admitting: Endocrinology

## 2018-07-15 ENCOUNTER — Other Ambulatory Visit: Payer: Self-pay | Admitting: Family Medicine

## 2018-07-18 ENCOUNTER — Telehealth: Payer: Self-pay

## 2018-07-18 ENCOUNTER — Other Ambulatory Visit: Payer: Self-pay | Admitting: Family Medicine

## 2018-07-18 ENCOUNTER — Telehealth: Payer: Self-pay | Admitting: Endocrinology

## 2018-07-18 MED ORDER — DAPAGLIFLOZIN PROPANEDIOL 5 MG PO TABS: 5.0000 mg | ORAL_TABLET | Freq: Every day | ORAL | 0 refills | Status: DC

## 2018-07-18 NOTE — Telephone Encounter (Signed)
dapagliflozin propanediol (FARXIGA) 5 MG TABS tablet 30 tablet 0 07/18/2018    Sig - Route: Take 5 mg by mouth daily. - Oral   Sent to pharmacy as: dapagliflozin propanediol (FARXIGA) 5 MG Tab tablet   E-Prescribing Status: Receipt confirmed by pharmacy (07/18/2018 9:20 AM EDT)    Please address concern re: elevated CBG

## 2018-07-18 NOTE — Telephone Encounter (Signed)
Pt needs refill on farxiga please as soon as possible call to [facility] please

## 2018-07-18 NOTE — Telephone Encounter (Signed)
Per [facility], Caller states she is needing a RX called in for her diabetes."

## 2018-07-18 NOTE — Telephone Encounter (Signed)
Received fax from [facility] that pt is out of her Marcelline Deist, she ran out 3 days ago and her current blood sugar as of 11am yesterday was 280.

## 2018-07-18 NOTE — Telephone Encounter (Signed)
Ov is due.  Let's address then 

## 2018-07-18 NOTE — Telephone Encounter (Signed)
dapagliflozin propanediol (FARXIGA) 5 MG TABS tablet 30 tablet 0 07/18/2018    Sig - Route: Take 5 mg by mouth daily. - Oral   Sent to pharmacy as: dapagliflozin propanediol (FARXIGA) 5 MG Tab tablet   E-Prescribing Status: Receipt confirmed by pharmacy (07/18/2018 9:20 AM EDT)

## 2018-07-18 NOTE — Telephone Encounter (Signed)
Per [facility], "Caller reports that she was supposed to have a prescription called in in to [facility] that her son was to pick up. Son reported her that full prescription was not available but pharmacy is reporting to the patient that the prescription has been picked up. Current blood sugar of 281. Pt reports feeling shaky and having anxiety about situation. [facility], [place], [place] ([phone])contacted by this RN and verified that prescription for Farxiga 5mg  was filled on 07/14/2018 and picked up at 16:21 on 07/15/2018. [facility] reports that new prescription would not be covered and patient would have to pay out of pocket. Caller informed of above information and verbalized understanding. Caller advised to take her remaining dose as prescribed today and make contact with her PCP in the morning as well as her [employer]. Caller verbalized understanding."

## 2018-07-18 NOTE — Telephone Encounter (Signed)
Requested Prescriptions  Pending Prescriptions Disp Refills  . dapagliflozin propanediol (FARXIGA) 5 MG TABS tablet 30 tablet 0    Sig: Take 5 mg by mouth daily.     Endocrinology:  Diabetes - SGLT2 Inhibitors Failed - 07/18/2018  8:53 AM      Failed - Cr in normal range and within 360 days    Creatinine, Ser  Date Value Ref Range Status  05/25/2016 0.80 0.44 - 1.00 mg/dL Final         Failed - LDL in normal range and within 360 days    No results found for: LDLCALC, LDLC, HIRISKLDL       Failed - eGFR in normal range and within 360 days    GFR calc Af Amer  Date Value Ref Range Status  05/25/2016 >60 >60 mL/min Final    Comment:    (NOTE) The eGFR has been calculated using the CKD EPI equation. This calculation has not been validated in all clinical situations. eGFR's persistently <60 mL/min signify possible Chronic Kidney Disease.    GFR calc non Af Amer  Date Value Ref Range Status  05/25/2016 >60 >60 mL/min Final         Passed - HBA1C is between 0 and 7.9 and within 180 days    Hemoglobin A1C  Date Value Ref Range Status  05/09/2018 7.7 (A) 4.0 - 5.6 % Final   Hgb A1c MFr Bld  Date Value Ref Range Status  05/25/2016 8.6 (H) 4.8 - 5.6 % Final    Comment:    (NOTE)         Pre-diabetes: 5.7 - 6.4         Diabetes: >6.4         Glycemic control for adults with diabetes: <7.0          Passed - Valid encounter within last 6 months    Recent Outpatient Visits          1 month ago Hypertensive heart disease without heart failure   [facility] Ethelene Hal, Mortimer Fries, MD   1 month ago Cerebral atherosclerosis   [facility] Ethelene Hal, Mortimer Fries, MD      Future Appointments            In 1 month Ethelene Hal, Mortimer Fries, MD [facility]

## 2018-07-18 NOTE — Telephone Encounter (Signed)
Scheduled for virtual visit 07/20/18

## 2018-07-20 ENCOUNTER — Ambulatory Visit: Payer: BC Managed Care – PPO | Admitting: Endocrinology

## 2018-07-20 ENCOUNTER — Other Ambulatory Visit: Payer: Self-pay

## 2018-08-03 ENCOUNTER — Telehealth: Payer: Self-pay

## 2018-08-03 NOTE — Telephone Encounter (Signed)
Overdue for an office visit. Called to schedule an appt. LVM requesting returned call. 

## 2018-08-09 ENCOUNTER — Telehealth: Payer: Self-pay

## 2018-08-09 NOTE — Telephone Encounter (Signed)
SECOND ATTEMPT  Called pt to schedule appt. Requested I provide her with 2 afternoon availabilities for the 3rd week of May (5/19 and 5/20 BOTH at 2:15pm). Advised I could not guarantee those dates/times will be available. Understood but states she needs to discuss with her son. Declined virtual visit. States she prefers a physical exam rather than other alternatives. States she will call back with her decision.

## 2018-08-20 ENCOUNTER — Other Ambulatory Visit: Payer: Self-pay | Admitting: Family Medicine

## 2018-08-24 ENCOUNTER — Encounter: Payer: Self-pay | Admitting: Family Medicine

## 2018-08-24 ENCOUNTER — Encounter: Payer: BC Managed Care – PPO | Admitting: Family Medicine

## 2018-08-24 NOTE — Progress Notes (Deleted)
Virtual Visit via Video Note  I connected with Jeneen Rinks on 08/24/18 at 10:00 AM EDT by a video enabled telemedicine application and verified that I am speaking with the correct person using two identifiers.  Location: Patient: *** Provider: ***   I discussed the limitations of evaluation and management by telemedicine and the availability of in person appointments. The patient expressed understanding and agreed to proceed.  History of Present Illness:    Observations/Objective:   Assessment and Plan:   Follow Up Instructions:    I discussed the assessment and treatment plan with the patient. The patient was provided an opportunity to ask questions and all were answered. The patient agreed with the plan and demonstrated an understanding of the instructions.   The patient was advised to call back or seek an in-person evaluation if the symptoms worsen or if the condition fails to improve as anticipated.  I provided *** minutes of non-face-to-face time during this encounter.

## 2018-08-30 ENCOUNTER — Telehealth: Payer: Self-pay

## 2018-08-30 NOTE — Telephone Encounter (Signed)
Overdue for an appt. No answer on home #. Female voice answered mobile #, informed Marie [name] about my call and then disconnected. No answer when attempting to call back on mobile #.

## 2018-09-21 ENCOUNTER — Telehealth: Payer: Self-pay

## 2018-09-21 NOTE — Telephone Encounter (Signed)
Copied from [place] [id number]. Topic: Appointment Scheduling - Scheduling Inquiry for Clinic >> Sep 21, 2018 10:00 AM Mathis Bud wrote: Reason for CRM: Patient is requesting/stating that she needs labs done due to her being a diabties.  Call back # [phone]

## 2018-09-21 NOTE — Telephone Encounter (Signed)
Returned pt call. Scheduled for follow up and labs 10/13/18

## 2018-09-28 ENCOUNTER — Other Ambulatory Visit: Payer: Self-pay | Admitting: Family Medicine

## 2018-10-10 ENCOUNTER — Telehealth: Payer: Self-pay | Admitting: Family Medicine

## 2018-10-10 DIAGNOSIS — E785 Hyperlipidemia, unspecified: Secondary | ICD-10-CM

## 2018-10-10 DIAGNOSIS — I119 Hypertensive heart disease without heart failure: Secondary | ICD-10-CM

## 2018-10-10 NOTE — Telephone Encounter (Signed)
Okay to order labs? It looks like she's due for lipid, cmp, vit d and a urine check.

## 2018-10-10 NOTE — Telephone Encounter (Signed)
Pt called requesting to get lab work done. She is wanting to get labs done before her appt so she could get her results at the time of her appt. Once labs are in please let me know so I can schedule lab appt and doctors appt. Please advise. [phone]

## 2018-10-11 ENCOUNTER — Encounter: Payer: Self-pay | Admitting: Family Medicine

## 2018-10-11 ENCOUNTER — Other Ambulatory Visit: Payer: Self-pay

## 2018-10-11 NOTE — Telephone Encounter (Signed)
Okay.  Thanks.

## 2018-10-11 NOTE — Telephone Encounter (Signed)
I have ordered the labs. She can come in and have the labs done at least a week prior to the appointment.

## 2018-10-13 ENCOUNTER — Other Ambulatory Visit: Payer: Self-pay

## 2018-10-13 ENCOUNTER — Encounter: Payer: Self-pay | Admitting: Endocrinology

## 2018-10-13 ENCOUNTER — Ambulatory Visit: Payer: BC Managed Care – PPO | Admitting: Endocrinology

## 2018-10-13 VITALS — BP 104/70 | HR 85 | Ht 61.5 in | Wt 126.0 lb

## 2018-10-13 DIAGNOSIS — E11319 Type 2 diabetes mellitus with unspecified diabetic retinopathy without macular edema: Secondary | ICD-10-CM | POA: Diagnosis not present

## 2018-10-13 LAB — BASIC METABOLIC PANEL
BUN: 13 mg/dL (ref 6–23)
CO2: 28 mEq/L (ref 19–32)
Calcium: 9.5 mg/dL (ref 8.4–10.5)
Chloride: 103 mEq/L (ref 96–112)
Creatinine, Ser: 0.87 mg/dL (ref 0.40–1.20)
GFR: 65.47 mL/min (ref >60.00)
Glucose, Bld: 135 mg/dL — ABNORMAL HIGH (ref 70–99)
Potassium: 4.7 mEq/L (ref 3.5–5.1)
Sodium: 141 mEq/L (ref 135–145)

## 2018-10-13 LAB — LIPID PANEL
Cholesterol: 156 mg/dL (ref 0–200)
HDL: 47.1 mg/dL (ref >39.00)
LDL Cholesterol: 90 mg/dL (ref 0–99)
NonHDL: 109.16
Total CHOL/HDL Ratio: 3
Triglycerides: 96 mg/dL (ref 0.0–149.0)
VLDL: 19.2 mg/dL (ref 0.0–40.0)

## 2018-10-13 LAB — HEPATIC FUNCTION PANEL
ALT: 25 U/L (ref 0–35)
AST: 16 U/L (ref 0–37)
Albumin: 4.4 g/dL (ref 3.5–5.2)
Alkaline Phosphatase: 70 U/L (ref 39–117)
Bilirubin, Direct: 0.1 mg/dL (ref 0.0–0.3)
Total Bilirubin: 0.4 mg/dL (ref 0.2–1.2)
Total Protein: 7.4 g/dL (ref 6.0–8.3)

## 2018-10-13 LAB — POCT GLYCOSYLATED HEMOGLOBIN (HGB A1C): Hemoglobin A1C: 7.7 % — AB (ref 4.0–5.6)

## 2018-10-13 LAB — MICROALBUMIN / CREATININE URINE RATIO
Creatinine,U: 62.7 mg/dL
Microalb Creat Ratio: 1.3 mg/g (ref 0.0–30.0)
Microalb, Ur: 0.8 mg/dL (ref 0.0–1.9)

## 2018-10-13 LAB — TSH: TSH: 2.42 u[IU]/mL (ref 0.35–4.50)

## 2018-10-13 MED ORDER — FARXIGA 10 MG PO TABS: 10.0000 mg | ORAL_TABLET | Freq: Every day | ORAL | 3 refills | Status: DC

## 2018-10-13 NOTE — Progress Notes (Signed)
Subjective:    Patient ID: Marie [name], female    DOB: 1955/04/17, 63 y.o.   MRN: [id number]  HPI Pt returns for f/u of diabetes mellitus: DM type: 2  Dx'ed: 2725 Complications: polyneuropathy, CVA, and DR Therapy: 3 oral meds.  GDM: never DKA: never Severe hypoglycemia: never Pancreatitis: never Pancreatic imaging (2017) CT: calcification in the pancreatic tail.  Other: she has freestyle libre continuous glucose monitor; she has never been on insulin.   Interval history: She takes 3 meds as rx'ed.  I reviewed continuous glucose monitor data.  glucose varies from 118-168.  It is in general higher as the day goes on.  She requests labs.   Past Medical History:  Diagnosis Date  . CVA (cerebral vascular accident) ([place]) 2015  . Depression   . Diabetes ([place])    t2  . Gallstones   . Hyperlipemia   . Hypertension   . Migraines     Past Surgical History:  Procedure Laterality Date  . CESAREAN SECTION     x2  . CHOLECYSTECTOMY N/A 05/25/2016   Procedure: LAPAROSCOPIC CHOLECYSTECTOMY WITH INTRAOPERATIVE CHOLANGIOGRAM;  Surgeon: Johnathan Hausen, MD;  Location: [facility];  Service: General;  Laterality: N/A;  . IR GENERIC HISTORICAL  03/13/2016   IR PERC CHOLECYSTOSTOMY 03/13/2016 Sandi Mariscal, MD [facility]INTER[facility] RAD  . IR GENERIC HISTORICAL  04/28/2016   IR RADIOLOGIST EVAL & MGMT 04/28/2016 Jacqulynn Cadet, MD [facility]  . LOOP RECORDER IMPLANT N/A 11/29/2013   Procedure: LOOP RECORDER IMPLANT;  Surgeon: Evans Lance, MD;  Location: [facility] CATH LAB;  Service: Cardiovascular;  Laterality: N/A;  . TEE WITHOUT CARDIOVERSION N/A 08/24/2013   Procedure: TRANSESOPHAGEAL ECHOCARDIOGRAM (TEE);  Surgeon: Sanda Klein, MD;  Location: [facility] ENDOSCOPY;  Service: Cardiovascular;  Laterality: N/A;    Social History   Socioeconomic History  . Marital status: Married    Spouse name: Not on file  . Number of children: 3  . Years of education: MBA  . Highest education level: Not on file  Occupational  History    Employer: [employer]    Comment: [place]  Social Needs  . Financial resource strain: Not on file  . Food insecurity    Worry: Not on file    Inability: Not on file  . Transportation needs    Medical: Not on file    Non-medical: Not on file  Tobacco Use  . Smoking status: Never Smoker  . Smokeless tobacco: Never Used  Substance and Sexual Activity  . Alcohol use: No    Alcohol/week: 1.0 standard drinks    Types: 1 Standard drinks or equivalent per week    Comment: Social  . Drug use: No  . Sexual activity: Not on file  Lifestyle  . Physical activity    Days per week: Not on file    Minutes per session: Not on file  . Stress: Not on file  Relationships  . Social Herbalist on phone: Not on file    Gets together: Not on file    Attends religious service: Not on file    Active member of club or organization: Not on file    Attends meetings of clubs or organizations: Not on file    Relationship status: Not on file  . Intimate partner violence    Fear of current or ex partner: Not on file    Emotionally abused: Not on file    Physically abused: Not on file    Forced  sexual activity: Not on file  Other Topics Concern  . Not on file  Social History Narrative   Patient lives at home with [name].   Patient works full time [employer]     College education MBA   Right handed   Caffeine two cups daily.          Current Outpatient Medications on File Prior to Visit  Medication Sig Dispense Refill  . amLODipine (NORVASC) 5 MG tablet Take 1 tablet (5 mg total) by mouth daily. 30 tablet 5  . aspirin EC 81 MG tablet Take 81 mg by mouth at bedtime.    [name]. atorvastatin (LIPITOR) 20 MG tablet Take 1 tablet (20 mg total) by mouth daily. 90 tablet 3  . Cholecalciferol (VITAMIN D3) 3000 units TABS Take 3,000 Units by mouth at bedtime.    . Coenzyme Q10 (COQ10) 200 MG CAPS Take 200 mg by mouth at bedtime.    . Continuous Blood Gluc  Sensor (FREESTYLE LIBRE 14 DAY SENSOR) MISC 1 Device by Does not apply route every 14 (fourteen) days. 6 each 3  . Ginkgo Biloba 500 MG CAPS Take 500 mg by mouth at bedtime.     [name]. glucose blood (ONETOUCH VERIO) test strip 1 each by Other route as needed for other. Use as instructed    . lisinopril (PRINIVIL,ZESTRIL) 40 MG tablet Take 1 tablet (40 mg total) by mouth at bedtime. 30 tablet 5  . metFORMIN (GLUCOPHAGE-XR) 500 MG 24 hr tablet Take 4 tablets (2,000 mg total) by mouth daily. 360 tablet 3  . Multiple Vitamin (MULTIVITAMIN WITH MINERALS) TABS tablet Take 1 tablet by mouth daily.    . Omega-3 Fatty Acids (OMEGA-3 FISH OIL) 1200 MG CAPS Take 1,200 mg by mouth at bedtime.    . sitaGLIPtin (JANUVIA) 100 MG tablet Take 1 tablet (100 mg total) by mouth daily. 90 tablet 3   No current facility-administered medications on file prior to visit.     Allergies  Allergen Reactions  . Penicillins Other (See Comments)    Driving couldn't see and head felt heavy  Has patient had a PCN reaction causing immediate rash, facial/tongue/throat swelling, SOB or lightheadedness with hypotension: No Has patient had a PCN reaction causing severe rash involving mucus membranes or skin necrosis: No Has patient had a PCN reaction that required hospitalization: No Has patient had a PCN reaction occurring within the last 10 years: No If all of the above answers are "NO", then may proceed with Cephalosporin use.   . Other Other (See Comments)    Allergic to beef and eggplant causes rash on skin    Family History  Problem Relation Age of Onset  . Stomach cancer Mother   . High blood pressure Mother   . Diabetes Mother   . Diabetes Maternal Grandfather   . High blood pressure Maternal Grandfather     BP 104/70 (BP Location: Left Arm, Patient Position: Sitting, Cuff Size: Normal)   Pulse 85   Ht 5' 1.5" (1.562 m)   Wt 126 lb (57.2 kg)   SpO2 98%   BMI 23.42 kg/m    Review of Systems She denies  hypoglycemia.     Objective:   Physical Exam VITAL SIGNS:  See vs page GENERAL: no distress Pulses: dorsalis pedis intact bilat.   MSK: no deformity of the feet CV: no leg edema Skin:  no ulcer on the feet.  normal color and temp on the feet. Neuro: sensation is  intact to touch on the feet.  Ext: several ingrown toenails.     Lab Results  Component Value Date   HGBA1C 7.7 (A) 10/13/2018   Lab Results  Component Value Date   CREATININE 0.80 05/25/2016   BUN 18 05/21/2016   NA 145 05/21/2016   K 4.8 05/21/2016   CL 108 05/21/2016   CO2 26 05/21/2016       Assessment & Plan:  Type 2 DM, with DR: she needs increased rx CVA, by hx.  Check lipids HTN: check BMET.  Patient Instructions  I have also sent a prescription to your pharmacy, to increase the [place]. Please continue the same other diabetes medications check your blood sugar once a day.  vary the time of day when you check, between before the 3 meals, and at bedtime.  also check if you have symptoms of your blood sugar being too high or too low.  please keep a record of the readings and bring it to your next appointment here (or you can bring the meter itself).  You can write it on any piece of paper.  please call us sooner if your blood sugar goes below 70, or if you have a lot of readings over 200. Please come back for a follow-up appointment in 2 months.

## 2018-10-13 NOTE — Patient Instructions (Addendum)
I have also sent a prescription to your pharmacy, to increase the [place]. Please continue the same other diabetes medications check your blood sugar once a day.  vary the time of day when you check, between before the 3 meals, and at bedtime.  also check if you have symptoms of your blood sugar being too high or too low.  please keep a record of the readings and bring it to your next appointment here (or you can bring the meter itself).  You can write it on any piece of paper.  please call us sooner if your blood sugar goes below 70, or if you have a lot of readings over 200. Please come back for a follow-up appointment in 2 months.

## 2018-11-24 ENCOUNTER — Telehealth: Payer: Self-pay | Admitting: Family Medicine

## 2018-11-24 NOTE — Telephone Encounter (Signed)
Spoke with pt. Will get labs from Dr. Radene Ou office for Dr. Ethelene Hal to review with pt next Friday at her appt. Lab appt canceled for tomorrow.

## 2018-11-24 NOTE — Telephone Encounter (Signed)
Copied from [place]. Topic: General - Other >> Nov 24, 2018  8:14 AM Keene Breath wrote: Reason for CRM: Patient called to ask the nurse to call her regarding her appt. For labs.  She stated that she has already had labs from her eye doctor and feels she does not need more labs at this time.  Please advise and call patient back asap CB# [phone]

## 2018-11-25 ENCOUNTER — Other Ambulatory Visit: Payer: BC Managed Care – PPO

## 2018-12-01 ENCOUNTER — Other Ambulatory Visit: Payer: Self-pay

## 2018-12-02 ENCOUNTER — Ambulatory Visit: Payer: BC Managed Care – PPO | Admitting: Family Medicine

## 2018-12-02 ENCOUNTER — Encounter: Payer: Self-pay | Admitting: Family Medicine

## 2018-12-02 VITALS — BP 120/70 | HR 103 | Ht 61.5 in | Wt 126.0 lb

## 2018-12-02 DIAGNOSIS — Z789 Other specified health status: Secondary | ICD-10-CM

## 2018-12-02 DIAGNOSIS — E11319 Type 2 diabetes mellitus with unspecified diabetic retinopathy without macular edema: Secondary | ICD-10-CM | POA: Diagnosis not present

## 2018-12-02 DIAGNOSIS — I10 Essential (primary) hypertension: Secondary | ICD-10-CM

## 2018-12-02 DIAGNOSIS — E785 Hyperlipidemia, unspecified: Secondary | ICD-10-CM

## 2018-12-02 DIAGNOSIS — Z8673 Personal history of transient ischemic attack (TIA), and cerebral infarction without residual deficits: Secondary | ICD-10-CM

## 2018-12-02 MED ORDER — AMLODIPINE BESYLATE 5 MG PO TABS: 5.0000 mg | ORAL_TABLET | Freq: Every day | ORAL | 3 refills | Status: DC

## 2018-12-02 MED ORDER — ATORVASTATIN CALCIUM 40 MG PO TABS: 40.0000 mg | ORAL_TABLET | Freq: Every day | ORAL | 3 refills | Status: DC

## 2018-12-02 NOTE — Patient Instructions (Signed)
Call Center for [facility] at [phone] to set up an appointment with a GYN provider.

## 2018-12-02 NOTE — Progress Notes (Signed)
Established Patient Office Visit  Subjective:  Patient ID: Marie [name], female    DOB: 1955-08-23  Age: 63 y.o. MRN: [id number]  CC:  Chief Complaint  Patient presents with  . Follow-up    HPI Marie [name] presents for follow-up of her hypertension, elevated cholesterol, diabetes and history of CVA.  Diabetes is currently followed by Dr. Everardo AllEllison.  Recent lab work checked shows an LDL of 90 and hemoglobin A1c of 7.7.  He is currently taking 20 of Lipitor.  She was able to see the ophthalmologist this past week.  She tells me that she is currently being treated for diabetic retinopathy neuropathy by Dr. Luciana Axeankin.  She was unable to see the dentist this year.  She was unable to follow-up with GYN for her Pap and pelvic exams.  She is due for colonoscopy.  She does not smoke or drink.  She is exercising daily by walking and working out in her home gym.  She lives with her older son and mentally child not challenged younger son.  She is disabled.  Past Medical History:  Diagnosis Date  . CVA (cerebral vascular accident) ([facility]) 2015  . Depression   . Diabetes ([facility])    t2  . Gallstones   . Hyperlipemia   . Hypertension   . Migraines     Past Surgical History:  Procedure Laterality Date  . CESAREAN SECTION     x2  . CHOLECYSTECTOMY N/A 05/25/2016   Procedure: LAPAROSCOPIC CHOLECYSTECTOMY WITH INTRAOPERATIVE CHOLANGIOGRAM;  Surgeon: Luretha MurphyMatthew Martin, MD;  [facility];  Service: General;  Laterality: N/A;  . IR GENERIC HISTORICAL  03/13/2016   IR PERC CHOLECYSTOSTOMY 03/13/2016 Simonne ComeJohn Watts, MD [facility]  . IR GENERIC HISTORICAL  04/28/2016   IR RADIOLOGIST EVAL & MGMT 04/28/2016 Malachy MoanHeath McCullough, MD [facility]  . LOOP RECORDER IMPLANT N/A 11/29/2013   Procedure: LOOP RECORDER IMPLANT;  Surgeon: Marinus MawGregg W Taylor, MD;  [facility];  Service: Cardiovascular;  Laterality: N/A;  . TEE WITHOUT CARDIOVERSION N/A 08/24/2013   Procedure: TRANSESOPHAGEAL ECHOCARDIOGRAM (TEE);  Surgeon:  Thurmon FairMihai Croitoru, MD;  [facility];  Service: Cardiovascular;  Laterality: N/A;    Family History  Problem Relation Age of Onset  . Stomach cancer Mother   . High blood pressure Mother   . Diabetes Mother   . Diabetes Maternal Grandfather   . High blood pressure Maternal Grandfather     Social History   Socioeconomic History  . Marital status: Married    Spouse name: Not on file  . Number of children: 3  . Years of education: MBA  . Highest education level: Not on file  Occupational History    Employer: [employer]    Comment: [place]  Social Needs  . Financial resource strain: Not on file  . Food insecurity    Worry: Not on file    Inability: Not on file  . Transportation needs    Medical: Not on file    Non-medical: Not on file  Tobacco Use  . Smoking status: Never Smoker  . Smokeless tobacco: Never Used  Substance and Sexual Activity  . Alcohol use: No    Alcohol/week: 1.0 standard drinks    Types: 1 Standard drinks or equivalent per week    Comment: Social  . Drug use: No  . Sexual activity: Not on file  Lifestyle  . Physical activity    Days per week: Not on file    Minutes per session: Not  on file  . Stress: Not on file  Relationships  . Social Musician on phone: Not on file    Gets together: Not on file    Attends religious service: Not on file    Active member of club or organization: Not on file    Attends meetings of clubs or organizations: Not on file    Relationship status: Not on file  . Intimate partner violence    Fear of current or ex partner: Not on file    Emotionally abused: Not on file    Physically abused: Not on file    Forced sexual activity: Not on file  Other Topics Concern  . Not on file  Social History Narrative   Patient lives at home with [name].   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          Outpatient  Medications Prior to Visit  Medication Sig Dispense Refill  . aspirin EC 81 MG tablet Take 81 mg by mouth at bedtime.    . Cholecalciferol (VITAMIN D3) 3000 units TABS Take 3,000 Units by mouth at bedtime.    . Coenzyme Q10 (COQ10) 200 MG CAPS Take 200 mg by mouth at bedtime.    . Continuous Blood Gluc Sensor (FREESTYLE LIBRE 14 DAY SENSOR) MISC 1 Device by Does not apply route every 14 (fourteen) days. 6 each 3  . dapagliflozin propanediol (FARXIGA) 10 MG TABS tablet Take 10 mg by mouth daily. 90 tablet 3  . Ginkgo Biloba 500 MG CAPS Take 500 mg by mouth at bedtime.     [name] glucose blood (ONETOUCH VERIO) test strip 1 each by Other route as needed for other. Use as instructed    . lisinopril (PRINIVIL,ZESTRIL) 40 MG tablet Take 1 tablet (40 mg total) by mouth at bedtime. 30 tablet 5  . metFORMIN (GLUCOPHAGE-XR) 500 MG 24 hr tablet Take 4 tablets (2,000 mg total) by mouth daily. 360 tablet 3  . Multiple Vitamin (MULTIVITAMIN WITH MINERALS) TABS tablet Take 1 tablet by mouth daily.    . Omega-3 Fatty Acids (OMEGA-3 FISH OIL) 1200 MG CAPS Take 1,200 mg by mouth at bedtime.    . sitaGLIPtin (JANUVIA) 100 MG tablet Take 1 tablet (100 mg total) by mouth daily. 90 tablet 3  . amLODipine (NORVASC) 5 MG tablet Take 1 tablet (5 mg total) by mouth daily. 30 tablet 5  . atorvastatin (LIPITOR) 20 MG tablet Take 1 tablet (20 mg total) by mouth daily. 90 tablet 3   No facility-administered medications prior to visit.     Allergies  Allergen Reactions  . Penicillins Other (See Comments)    Driving couldn't see and head felt heavy  Has patient had a PCN reaction causing immediate rash, facial/tongue/throat swelling, SOB or lightheadedness with hypotension: No Has patient had a PCN reaction causing severe rash involving mucus membranes or skin necrosis: No Has patient had a PCN reaction that required hospitalization: No Has patient had a PCN reaction occurring within the last 10 years: No If all of the above  answers are "NO", then may proceed with Cephalosporin use.   . Other Other (See Comments)    Allergic to beef and eggplant causes rash on skin    ROS Review of Systems  Constitutional: Negative.   HENT: Negative.   Eyes: Negative for photophobia and visual disturbance.  Respiratory: Negative.  Cardiovascular: Negative.   Gastrointestinal: Negative.   Endocrine: Negative for polyphagia and polyuria.  Genitourinary: Negative for difficulty urinating, frequency and urgency.  Musculoskeletal: Negative for gait problem and joint swelling.  Skin: Negative for pallor and rash.  Allergic/Immunologic: Negative for immunocompromised state.  Neurological: Negative for numbness.  Hematological: Does not bruise/bleed easily.  Psychiatric/Behavioral: Negative.       Objective:    Physical Exam  Constitutional: She is oriented to person, place, and time. She appears well-developed and well-nourished. No distress.  HENT:  Head: Normocephalic and atraumatic.  Right Ear: External ear normal.  Left Ear: External ear normal.  Mouth/Throat: Oropharynx is clear and moist. No oropharyngeal exudate.  Eyes: Pupils are equal, round, and reactive to light. Conjunctivae are normal. Right eye exhibits no discharge. Left eye exhibits no discharge. No scleral icterus.  Neck: Neck supple. No JVD present. No tracheal deviation present. No thyromegaly present.  Cardiovascular: Normal rate, regular rhythm and normal heart sounds.  Pulmonary/Chest: Effort normal and breath sounds normal. No stridor.  Abdominal: Bowel sounds are normal.  Musculoskeletal:        General: No edema.  Lymphadenopathy:    She has no cervical adenopathy.  Neurological: She is alert and oriented to person, place, and time.  Skin: Skin is warm and dry. She is not diaphoretic.  Psychiatric: She has a normal mood and affect.    BP 120/70   Pulse (!) 103   Ht 5' 1.5" (1.562 m)   Wt 126 lb (57.2 kg)   SpO2 98%   BMI 23.42 kg/m   Wt Readings from Last 3 Encounters:  12/02/18 126 lb (57.2 kg)  10/13/18 126 lb (57.2 kg)  06/03/18 126 lb 9.6 oz (57.4 kg)   BP Readings from Last 3 Encounters:  12/02/18 120/70  10/13/18 104/70  06/03/18 120/66   Guideline developer:  UpToDate (see UpToDate for funding source) Date Released: June 2014  Health Maintenance Due  Topic Date Due  . Hepatitis C Screening  04[birth year]  . HIV Screening  05/27/1969  . TETANUS/TDAP  05/27/1973  . PAP SMEAR-Modifier  05/28/1975  . MAMMOGRAM  05/27/2004  . COLONOSCOPY  05/27/2004    There are no preventive care reminders to display for this patient.  Lab Results  Component Value Date   TSH 2.42 10/13/2018   Lab Results  Component Value Date   WBC 9.7 05/25/2016   HGB 12.5 05/25/2016   HCT 37.9 05/25/2016   MCV 83.7 05/25/2016   PLT 165 05/25/2016   Lab Results  Component Value Date   NA 141 10/13/2018   K 4.7 10/13/2018   CO2 28 10/13/2018   GLUCOSE 135 (H) 10/13/2018   BUN 13 10/13/2018   CREATININE 0.87 10/13/2018   BILITOT 0.4 10/13/2018   ALKPHOS 70 10/13/2018   AST 16 10/13/2018   ALT 25 10/13/2018   PROT 7.4 10/13/2018   ALBUMIN 4.4 10/13/2018   CALCIUM 9.5 10/13/2018   ANIONGAP 11 05/21/2016   GFR 65.47 10/13/2018   Lab Results  Component Value Date   CHOL 156 10/13/2018   Lab Results  Component Value Date   HDL 47.10 10/13/2018   Lab Results  Component Value Date   LDLCALC 90 10/13/2018   Lab Results  Component Value Date   TRIG 96.0 10/13/2018   Lab Results  Component Value Date   CHOLHDL 3 10/13/2018   Lab Results  Component Value Date   HGBA1C 7.7 (A) 10/13/2018      Assessment &  Plan:   Problem List Items Addressed This Visit      Endocrine   Diabetes with retinopathy (HCC) (Chronic)   Relevant Medications   atorvastatin (LIPITOR) 40 MG tablet     Other   Hyperlipidemia (Chronic)   Relevant Medications   amLODipine (NORVASC) 5 MG tablet   atorvastatin (LIPITOR) 40 MG  tablet   History of CVA (cerebrovascular accident) (multiple, recurrent) (Chronic)   Health maintenance alteration - Primary   Relevant Orders   Ambulatory referral to Gastroenterology    Other Visit Diagnoses    Essential hypertension       Relevant Medications   amLODipine (NORVASC) 5 MG tablet   atorvastatin (LIPITOR) 40 MG tablet      Meds ordered this encounter  Medications  . amLODipine (NORVASC) 5 MG tablet    Sig: Take 1 tablet (5 mg total) by mouth daily.    Dispense:  90 tablet    Refill:  3  . atorvastatin (LIPITOR) 40 MG tablet    Sig: Take 1 tablet (40 mg total) by mouth daily.    Dispense:  90 tablet    Refill:  3    Follow-up: Return in about 3 months (around 03/04/2019).   Have increased her Lipitor to 40 mg daily.  Continue other medicines as above.  She was given information for GYN provider.  Follow-up in 3 months.

## 2018-12-19 ENCOUNTER — Ambulatory Visit: Payer: BC Managed Care – PPO | Admitting: Endocrinology

## 2019-01-03 ENCOUNTER — Telehealth: Payer: Self-pay | Admitting: Endocrinology

## 2019-01-03 ENCOUNTER — Telehealth: Payer: Self-pay

## 2019-01-03 NOTE — Telephone Encounter (Signed)
Patient ph# [phone] called re; Patient is not able to afford FreeStyle Libre 14 day device. Patient called her insurance Company who told her that if Dr. Loanne Drilling would do P.A. for the above device, patient would get a more affordable price. Patient requests Dr. Loanne Drilling do a P.A. to [facility] ph# [phone].

## 2019-01-03 NOTE — Telephone Encounter (Signed)
Copied from [place] [id number]. Topic: General - Call Back - No Documentation >> Jan 03, 2019  3:22 PM Erick Blinks wrote: Reason for CRM:  Patient ph# [phone] called re; Patient is not able to afford FreeStyle Libre 14 day device. Patient called her insurance Company who told her that if Dr. Loanne Drilling would do P.A. for the above device, patient would get a more affordable price. Patient requests Dr. Loanne Drilling do a P.A. to [facility] ph# [phone].

## 2019-01-03 NOTE — Telephone Encounter (Signed)
Pt plan does not cover Freestyle Libre but does cover both Accu Chek and DEXCOM. Pt's request for a PA would be incorrect but rather would need a tier exception in order for her insurance provider to cover and for pt to receive a NON COVERED benefit at a more affordable cost. Attempt was made to complete tier exception through Cover My Meds. Unable to process electronically. Further advised that request would need to be faxed to [place].  Company: Film/video editor  Document: Research scientist (physical sciences) form Other records requested: N/A  All above requested information has been faxed successfully to [employer] listed above. Documents and fax confirmation have been placed in the faxed file for future reference.

## 2019-01-03 NOTE — Telephone Encounter (Signed)
Closed as duplicate. This was addressed in another encounter

## 2019-01-03 NOTE — Telephone Encounter (Signed)
Received notification from [facility] that tier exclusion/PA for Freestyle Elenor Legato has been denied. Following reasons provided: pt prescription benefit plan does not cover the requested device. Document has been labeled and placed in scan file for HIM and for our future reference. Called pt and informed her about denial. Advised her plan does allow for Dexcom but she will need to call to determine her out of pocket expense. If affordable, is welcome to call our office so a Rx can be sent to her pharmacy. Verbalized acceptance and understanding.

## 2019-01-05 ENCOUNTER — Encounter: Payer: Self-pay | Admitting: Family Medicine

## 2019-01-09 ENCOUNTER — Ambulatory Visit: Payer: BC Managed Care – PPO | Admitting: Endocrinology

## 2019-01-11 ENCOUNTER — Telehealth: Payer: Self-pay

## 2019-01-11 ENCOUNTER — Encounter: Payer: BC Managed Care – PPO | Admitting: Obstetrics & Gynecology

## 2019-01-11 NOTE — Telephone Encounter (Signed)
I think she's referring to the pneumonia vaccines? She's only 63, so they wouldn't have given her the high dose this year anyway.   Copied from [facility] [phone]. Topic: General - Inquiry >> Jan 11, 2019  1:03 PM Lennox Solders wrote: reason for CRM:PT is calling and she got a regular flu shot at [facility] not high dose. Pt is calling and was told there is another flu shot she can get due to dm and heart issues.

## 2019-01-12 NOTE — Telephone Encounter (Signed)
I spoke with pt. She is aware that she is good with her flu shot for this year and starting next year, she will receive the high dose flu shot. Pt verbalized understanding.

## 2019-01-25 ENCOUNTER — Telehealth: Payer: Self-pay

## 2019-01-25 NOTE — Telephone Encounter (Signed)
Per Dr. Loanne Drilling, unable to complete DWO (Detailed Written Order) for [facility] without an appt. Routing this message to the front desk for scheduling purposes.

## 2019-01-27 NOTE — Telephone Encounter (Signed)
Patient is scheduled for appointment on 02/09/19 at 3:30 p.m.

## 2019-01-30 ENCOUNTER — Encounter: Payer: BC Managed Care – PPO | Admitting: Obstetrics & Gynecology

## 2019-02-01 ENCOUNTER — Telehealth: Payer: Self-pay | Admitting: Endocrinology

## 2019-02-01 NOTE — Telephone Encounter (Signed)
LVM requesting pt return call so she can be informed of all info below AND to offer if she would like to reschedule her appt to a sooner date.  From 01/25/19 encounter: Per Dr. Loanne Drilling, unable to complete DWO (Detailed Written Order) for [facility] without an appt. Routing this message to the front desk for scheduling purposes.  Again, this form cannot be completed without a current appt. Pt chose to schedule her appt as indicated and requested below:  Next Appt With Internal Medicine Renato Shin, MD) 02/09/2019 at 3:30 PM  Unfortunately, this request to schedule an appt for date listed above creates a delay in completion of the form. Pt is welcome to move up her appt to a sooner date if she chooses to do so.

## 2019-02-01 NOTE — Telephone Encounter (Signed)
Patient ph# [phone] called re: Freestyle Libre. Patient states [facility] sent a form to Dr. Loanne Drilling to be filled out for the [facility] but have not received the form back and therefore patient would have to pay full price for the above.  [employer] does not cover the above product therefore patient goes through [employer]. Patient does not have a Freestyle Libre device to use anymore.Patient requests to be called at the ph# listed above to discuss.

## 2019-02-01 NOTE — Telephone Encounter (Signed)
Pt returned call. Informed about [facility]'s criteria for face to face visit in order to complete paperwork. Declined my offer to move her appt to a sooner date. No further action required for this encounter. DWO for CGM remains on hold pending 02/09/19 appt

## 2019-02-07 ENCOUNTER — Other Ambulatory Visit: Payer: Self-pay

## 2019-02-09 ENCOUNTER — Other Ambulatory Visit: Payer: Self-pay

## 2019-02-09 ENCOUNTER — Encounter: Payer: Self-pay | Admitting: Endocrinology

## 2019-02-09 ENCOUNTER — Telehealth: Payer: Self-pay

## 2019-02-09 ENCOUNTER — Ambulatory Visit: Payer: BC Managed Care – PPO | Admitting: Endocrinology

## 2019-02-09 VITALS — BP 136/78 | HR 104 | Ht 61.5 in | Wt 129.4 lb

## 2019-02-09 DIAGNOSIS — E11319 Type 2 diabetes mellitus with unspecified diabetic retinopathy without macular edema: Secondary | ICD-10-CM | POA: Diagnosis not present

## 2019-02-09 LAB — POCT GLYCOSYLATED HEMOGLOBIN (HGB A1C): Hemoglobin A1C: 7.2 % — AB (ref 4.0–5.6)

## 2019-02-09 MED ORDER — RYBELSUS 7 MG PO TABS: 7.0000 mg | ORAL_TABLET | ORAL | 3 refills | Status: DC

## 2019-02-09 NOTE — Patient Instructions (Addendum)
I have also sent a prescription to your pharmacy, to change the Januvia to "Rybelsus."   Please continue the same other diabetes medications.   check your blood sugar once a day.  vary the time of day when you check, between before the 3 meals, and at bedtime.  also check if you have symptoms of your blood sugar being too high or too low.  please keep a record of the readings and bring it to your next appointment here (or you can bring the meter itself).  You can write it on any piece of paper.  please call us sooner if your blood sugar goes below 70, or if you have a lot of readings over 200. Please come back for a follow-up appointment in 2 months.

## 2019-02-09 NOTE — Telephone Encounter (Signed)
Company: [facility]  Document: DWO for CGM Other records requested: None requested  All above requested information has been faxed successfully to [employer] listed above. Documents and fax confirmation have been placed in the faxed file for future reference.

## 2019-02-09 NOTE — Progress Notes (Signed)
Subjective:    Patient ID: Marie [name], female    DOB: 1955-09-09, 63 y.o.   MRN: [id number]  HPI Pt returns for f/u of diabetes mellitus: DM type: 2  Dx'ed: 2010 Complications: polyneuropathy, CVA, and DR.   Therapy: 3 oral meds.  GDM: never DKA: never Severe hypoglycemia: never Pancreatitis: never Pancreatic imaging (2017) CT: calcification in the pancreatic tail.  Other: she has freestyle libre continuous glucose monitor; she has never been on insulin.   Interval history: She takes 3 meds as rx'ed.  I reviewed continuous glucose monitor data.  glucose varies from 70-260.  It is in general highest in the afternoon.  pt states she feels well in general.  Past Medical History:  Diagnosis Date  . CVA (cerebral vascular accident) (HCC) 2015  . Depression   . Diabetes (HCC)    t2  . Gallstones   . Hyperlipemia   . Hypertension   . Migraines     Past Surgical History:  Procedure Laterality Date  . CESAREAN SECTION     x2  . CHOLECYSTECTOMY N/A 05/25/2016   Procedure: LAPAROSCOPIC CHOLECYSTECTOMY WITH INTRAOPERATIVE CHOLANGIOGRAM;  Surgeon: Luretha MurphyMatthew Martin, MD;  Location: [facility] ORS;  Service: General;  Laterality: N/A;  . IR GENERIC HISTORICAL  03/13/2016   IR PERC CHOLECYSTOSTOMY 03/13/2016 Simonne ComeJohn Watts, MD [facility]-INTERV RAD  . IR GENERIC HISTORICAL  04/28/2016   IR RADIOLOGIST EVAL & MGMT 04/28/2016 Malachy MoanHeath McCullough, MD [facility] INTERV RAD  . LOOP RECORDER IMPLANT N/A 11/29/2013   Procedure: LOOP RECORDER IMPLANT;  Surgeon: Marinus MawGregg W Taylor, MD;  [facility];  Service: Cardiovascular;  Laterality: N/A;  . TEE WITHOUT CARDIOVERSION N/A 08/24/2013   Procedure: TRANSESOPHAGEAL ECHOCARDIOGRAM (TEE);  Surgeon: Thurmon FairMihai Croitoru, MD;  [facility];  Service: Cardiovascular;  Laterality: N/A;    Social History   Socioeconomic History  . Marital status: Married    Spouse name: Not on file  . Number of children: 3  . Years of education: MBA  . Highest education level: Not on  file  Occupational History    Employer: [employer]    Comment: [employer]  Social Needs  . Financial resource strain: Not on file  . Food insecurity    Worry: Not on file    Inability: Not on file  . Transportation needs    Medical: Not on file    Non-medical: Not on file  Tobacco Use  . Smoking status: Never Smoker  . Smokeless tobacco: Never Used  Substance and Sexual Activity  . Alcohol use: No    Alcohol/week: 1.0 standard drinks    Types: 1 Standard drinks or equivalent per week    Comment: Social  . Drug use: No  . Sexual activity: Not on file  Lifestyle  . Physical activity    Days per week: Not on file    Minutes per session: Not on file  . Stress: Not on file  Relationships  . Social Musicianconnections    Talks on phone: Not on file    Gets together: Not on file    Attends religious service: Not on file    Active member of club or organization: Not on file    Attends meetings of clubs or organizations: Not on file    Relationship status: Not on file  . Intimate partner violence    Fear of current or ex partner: Not on file    Emotionally abused: Not on file    Physically abused: Not on file  Forced sexual activity: Not on file  Other Topics Concern  . Not on file  Social History Narrative   Patient lives at home with [name].   Patient works full time [employer]  education MBA   Right handed   Caffeine two cups daily.          Current Outpatient Medications on File Prior to Visit  Medication Sig Dispense Refill  . amLODipine (NORVASC) 5 MG tablet Take 1 tablet (5 mg total) by mouth daily. 90 tablet 3  . aspirin EC 81 MG tablet Take 81 mg by mouth at bedtime.    [name] atorvastatin (LIPITOR) 40 MG tablet Take 1 tablet (40 mg total) by mouth daily. 90 tablet 3  . Cholecalciferol (VITAMIN D3) 3000 units TABS Take 3,000 Units by mouth at bedtime.    . Coenzyme Q10 (COQ10) 200 MG CAPS Take 200 mg by mouth at bedtime.    .  Continuous Blood Gluc Sensor (FREESTYLE LIBRE 14 DAY SENSOR) MISC 1 Device by Does not apply route every 14 (fourteen) days. 6 each 3  . dapagliflozin propanediol (FARXIGA) 10 MG TABS tablet Take 10 mg by mouth daily. 90 tablet 3  . Ginkgo Biloba 500 MG CAPS Take 500 mg by mouth at bedtime.     [name] glucose blood (ONETOUCH VERIO) test strip 1 each by Other route as needed for other. Use as instructed    . lisinopril (PRINIVIL,ZESTRIL) 40 MG tablet Take 1 tablet (40 mg total) by mouth at bedtime. 30 tablet 5  . metFORMIN (GLUCOPHAGE-XR) 500 MG 24 hr tablet Take 4 tablets (2,000 mg total) by mouth daily. 360 tablet 3  . Multiple Vitamin (MULTIVITAMIN WITH MINERALS) TABS tablet Take 1 tablet by mouth daily.    . Omega-3 Fatty Acids (OMEGA-3 FISH OIL) 1200 MG CAPS Take 1,200 mg by mouth at bedtime.     No current facility-administered medications on file prior to visit.     Allergies  Allergen Reactions  . Penicillins Other (See Comments)    Driving couldn't see and head felt heavy  Has patient had a PCN reaction causing immediate rash, facial/tongue/throat swelling, SOB or lightheadedness with hypotension: No Has patient had a PCN reaction causing severe rash involving mucus membranes or skin necrosis: No Has patient had a PCN reaction that required hospitalization: No Has patient had a PCN reaction occurring within the last 10 years: No If all of the above answers are "NO", then may proceed with Cephalosporin use.   . Other Other (See Comments)    Allergic to beef and eggplant causes rash on skin    Family History  Problem Relation Age of Onset  . Stomach cancer Mother   . High blood pressure Mother   . Diabetes Mother   . Diabetes Maternal Grandfather   . High blood pressure Maternal Grandfather     BP 136/78 (BP Location: Left Arm, Patient Position: Sitting, Cuff Size: Normal)   Pulse (!) 104   Ht 5' 1.5" (1.562 m)   Wt 129 lb 6.4 oz (58.7 kg)   SpO2 98%   BMI 24.05 kg/m     Review of Systems She denies hypoglycemia    Objective:   Physical Exam VITAL SIGNS:  See vs page GENERAL: no distress Pulses: dorsalis pedis intact bilat.   MSK: no deformity of the feet CV: no leg edema Skin:  no ulcer on the feet.  normal color and temp on the feet.  Neuro: sensation is intact to touch on the feet.   Ext: there is bilateral onychomycosis of the toenails.    Lab Results  Component Value Date   HGBA1C 7.2 (A) 02/09/2019    Lab Results  Component Value Date   CREATININE 0.87 10/13/2018   BUN 13 10/13/2018   NA 141 10/13/2018   K 4.7 10/13/2018   CL 103 10/13/2018   CO2 28 10/13/2018       Assessment & Plan:  Type 2 DM, with DR: she needs increased rx, if it can be done with a regimen that avoids or minimizes hypoglycemia.   Patient Instructions  I have also sent a prescription to your pharmacy, to change the Januvia to "Rybelsus."   Please continue the same other diabetes medications.   check your blood sugar once a day.  vary the time of day when you check, between before the 3 meals, and at bedtime.  also check if you have symptoms of your blood sugar being too high or too low.  please keep a record of the readings and bring it to your next appointment here (or you can bring the meter itself).  You can write it on any piece of paper.  please call us sooner if your blood sugar goes below 70, or if you have a lot of readings over 200. Please come back for a follow-up appointment in 2 months.

## 2019-02-10 NOTE — Progress Notes (Signed)
This encounter was created in error - please disregard.

## 2019-02-15 ENCOUNTER — Encounter: Payer: Self-pay | Admitting: Obstetrics & Gynecology

## 2019-02-15 ENCOUNTER — Ambulatory Visit (INDEPENDENT_AMBULATORY_CARE_PROVIDER_SITE_OTHER): Payer: BC Managed Care – PPO | Admitting: Obstetrics & Gynecology

## 2019-02-15 ENCOUNTER — Other Ambulatory Visit: Payer: Self-pay

## 2019-02-15 VITALS — BP 141/78 | HR 97 | Wt 125.0 lb

## 2019-02-15 DIAGNOSIS — Z1239 Encounter for other screening for malignant neoplasm of breast: Secondary | ICD-10-CM | POA: Diagnosis not present

## 2019-02-15 DIAGNOSIS — Z87442 Personal history of urinary calculi: Secondary | ICD-10-CM | POA: Diagnosis not present

## 2019-02-15 DIAGNOSIS — Z01419 Encounter for gynecological examination (general) (routine) without abnormal findings: Secondary | ICD-10-CM | POA: Diagnosis not present

## 2019-02-15 DIAGNOSIS — Z1151 Encounter for screening for human papillomavirus (HPV): Secondary | ICD-10-CM | POA: Diagnosis not present

## 2019-02-15 DIAGNOSIS — Z124 Encounter for screening for malignant neoplasm of cervix: Secondary | ICD-10-CM | POA: Diagnosis not present

## 2019-02-15 NOTE — Progress Notes (Signed)
Subjective:     Marie [name] is a 63 y.o. female here for a routine exam.  Current complaints: Pt denies active complaints. She is establishing care with a new provider.     Gynecologic History No LMP recorded. Patient is postmenopausal. Contraception: post menopausal status Last Pap: no h/o abnormal PAPs Last mammogram: pt reports that she was never referred for a mammogram    Obstetric History OB History  Gravida Para Term Preterm AB Living  4 4 4    0 4  SAB TAB Ectopic Multiple Live Births  0 0 0 0 4    # Outcome Date GA Lbr Len/2nd Weight Sex Delivery Anes PTL Lv  4 Term           3 Term           2 Term           1 Term            The following portions of the patient's history were reviewed and updated as appropriate: allergies, current medications, past family history, past medical history, past social history, past surgical history and problem list.  Review of Systems Pertinent items are noted in HPI.    Objective:  BP (!) 141/78   Pulse 97   Wt 125 lb (56.7 kg)   BMI 23.24 kg/m  General Appearance:    Alert, cooperative, no distress, appears stated age  Head:    Normocephalic, without obvious abnormality, atraumatic  Eyes:    conjunctiva/corneas clear, EOM's intact, both eyes  Ears:    Normal external ear canals, both ears  Nose:   Nares normal, septum midline, mucosa normal, no drainage    or sinus tenderness  Throat:   Lips, mucosa, and tongue normal; teeth and gums normal  Neck:   Supple, symmetrical, trachea midline, no adenopathy;    thyroid:  no enlargement/tenderness/nodules  Back:     Symmetric, no curvature, ROM normal, no CVA tenderness  Lungs:     respirations unlabored  Chest Wall:    No tenderness or deformity   Heart:    Regular rate and rhythm  Breast Exam:    No tenderness, masses, or nipple abnormality  Abdomen:     Soft, non-tender, bowel sounds active all four quadrants,    no masses, no organomegaly  Genitalia:    Normal female without  lesion, discharge or tenderness     Extremities:   Extremities normal, atraumatic, no cyanosis or edema  Pulses:   2+ and symmetric all extremities  Skin:   Skin color, texture, turgor normal, no rashes or lesions    Assessment:    Healthy female exam.   Post menopausal state Breast cancer screen    Plan:  F/u PAP Screening mammogram  Needs [facility] from [facility] provider  [name], M.D., Cherlynn June

## 2019-02-16 ENCOUNTER — Telehealth: Payer: Self-pay | Admitting: Endocrinology

## 2019-02-16 NOTE — Telephone Encounter (Signed)
Patient ph# [phone] called re: patient is still unable to get the [facility] even after coming to a follow up appointment with Dr.Ellison. [facility] told patient that they have not received information from patient's last visit with Dr. Loanne Drilling and that is why they have not shipped the above Rx to the patient. Please call patient at the ph# listed above to advise.

## 2019-02-17 LAB — CYTOLOGY - PAP
Comment: NEGATIVE
Diagnosis: NEGATIVE
High risk HPV: NEGATIVE

## 2019-02-17 LAB — URINE CULTURE: Organism ID, Bacteria: NO GROWTH

## 2019-02-17 NOTE — Telephone Encounter (Signed)
Please call pt to further discuss 

## 2019-02-22 ENCOUNTER — Telehealth: Payer: Self-pay

## 2019-02-22 NOTE — Telephone Encounter (Signed)
Pt called the office requesting Urine Culture and Pap smear results. Pt made aware that Urine Culture showed no growth and Pap smear is within normal limits. Pt also requested an appt for a mammogram. Pt is aware that her mammogram is scheduled for 03/29/19 at 1:30pm. Understanding was voiced. chiquita l wilson, CMA

## 2019-02-28 NOTE — Telephone Encounter (Signed)
Sensors were shipped to patient on 11/15.

## 2019-03-06 ENCOUNTER — Other Ambulatory Visit: Payer: Self-pay

## 2019-03-06 ENCOUNTER — Ambulatory Visit: Payer: BC Managed Care – PPO | Admitting: Family Medicine

## 2019-03-06 ENCOUNTER — Encounter: Payer: Self-pay | Admitting: Family Medicine

## 2019-03-06 VITALS — BP 120/70 | HR 100 | Ht 61.5 in | Wt 125.1 lb

## 2019-03-06 DIAGNOSIS — Z8673 Personal history of transient ischemic attack (TIA), and cerebral infarction without residual deficits: Secondary | ICD-10-CM | POA: Diagnosis not present

## 2019-03-06 DIAGNOSIS — I119 Hypertensive heart disease without heart failure: Secondary | ICD-10-CM | POA: Diagnosis not present

## 2019-03-06 DIAGNOSIS — E782 Mixed hyperlipidemia: Secondary | ICD-10-CM

## 2019-03-06 DIAGNOSIS — I10 Essential (primary) hypertension: Secondary | ICD-10-CM | POA: Diagnosis not present

## 2019-03-06 NOTE — Patient Instructions (Signed)
Implantable Loop Recorder Placement, Care After °This sheet gives you information about how to care for yourself after your procedure. Your health care provider may also give you more specific instructions. If you have problems or questions, contact your health care provider. °What can I expect after the procedure? °After the procedure, it is common to have: °· Soreness or discomfort near the incision. °· Some swelling or bruising near the incision. °Follow these instructions at home: °Incision care ° °· Follow instructions from your health care provider about how to take care of your incision. Make sure you: °? Wash your hands with soap and water before you change your bandage (dressing). If soap and water are not available, use hand sanitizer. °? Change your dressing as told by your health care provider. °? Keep your dressing dry. °? Leave stitches (sutures), skin glue, or adhesive strips in place. These skin closures may need to stay in place for 2 weeks or longer. If adhesive strip edges start to loosen and curl up, you may trim the loose edges. Do not remove adhesive strips completely unless your health care provider tells you to do that. °· Check your incision area every day for signs of infection. Check for: °? Redness, swelling, or pain. °? Fluid or blood. °? Warmth. °? Pus or a bad smell. °· Do not take baths, swim, or use a hot tub until your health care provider approves. Ask your health care provider if you can take showers. °Activity ° °· Return to your normal activities as told by your health care provider. Ask your health care provider what activities are safe for you. °· Do not drive for 24 hours if you were given a sedative during your procedure. °General instructions °· Follow instructions from your health care provider about how to manage your implantable loop recorder and transmit the information. Learn how to activate a recording if this is necessary for your type of device. °· Do not go through  a metal detection gate, and do not let someone hold a metal detector over your chest. Show your ID card. °· Do not have an MRI unless you check with your health care provider first. °· Take over-the-counter and prescription medicines only as told by your health care provider. °· Keep all follow-up visits as told by your health care provider. This is important. °Contact a health care provider if: °· You have redness, swelling, or pain around your incision. °· You have a fever. °· You have pain that is not relieved by your pain medicine. °· You have triggered your device because of fainting (syncope) or because of a heartbeat that feels like it is racing, slow, fluttering, or skipping (palpitations). °Get help right away if you have: °· Chest pain. °· Difficulty breathing. °Summary °· After the procedure, it is common to have soreness or discomfort near the incision. °· Change your dressing as told by your health care provider. °· Follow instructions from your health care provider about how to manage your implantable loop recorder and transmit the information. °· Keep all follow-up visits as told by your health care provider. This is important. °This information is not intended to replace advice given to you by your health care provider. Make sure you discuss any questions you have with your health care provider. °Document Released: 03/04/2015 Document Revised: 05/08/2017 Document Reviewed: 05/08/2017 °Elsevier Patient Education © 2020 [employer] ° °

## 2019-03-06 NOTE — Progress Notes (Signed)
Established Patient Office Visit  Subjective:  Patient ID: Marie [name] Myhand, female    DOB: 13-Apr-1955  Age: 63 y.o. MRN: [id number]  CC:  Chief Complaint  Patient presents with  . Follow-up    HPI Marie [name] Threats presents for follow-up of her lipid profile status post increasing the dose of her Lipitor to maximum dose of 80 mg.  Patient has tolerated this dose without issue she tells me.  LDL has not been that bad at 90 but patient is status post multiple CVAs and would like to see it lower.  She request referral to cardiology to discuss possible extraction of her loop recorder that is no longer functional.  Status post recent normal Pap smear.  Has been scheduled for mammogram.  Patient is 6 hours fasting this afternoon.  Past Medical History:  Diagnosis Date  . CVA (cerebral vascular accident) (HCC) 2015  . Depression   . Diabetes (HCC)    t2  . Gallstones   . Hyperlipemia   . Hypertension   . Migraines     Past Surgical History:  Procedure Laterality Date  . CESAREAN SECTION     x2  . CHOLECYSTECTOMY N/A 05/25/2016   Procedure: LAPAROSCOPIC CHOLECYSTECTOMY WITH INTRAOPERATIVE CHOLANGIOGRAM;  Surgeon: Luretha MurphyMatthew Martin, MD;  Location: [facility];  Service: General;  Laterality: N/A;  . IR GENERIC HISTORICAL  03/13/2016   IR PERC CHOLECYSTOSTOMY 03/13/2016 Simonne ComeJohn Watts, MD [facility]  . IR GENERIC HISTORICAL  04/28/2016   IR RADIOLOGIST EVAL & MGMT 04/28/2016 Malachy MoanHeath McCullough, MD [facility]  . LOOP RECORDER IMPLANT N/A 11/29/2013   Procedure: LOOP RECORDER IMPLANT;  Surgeon: Marinus MawGregg W Taylor, MD;  [facility];  Service: Cardiovascular;  Laterality: N/A;  . TEE WITHOUT CARDIOVERSION N/A 08/24/2013   Procedure: TRANSESOPHAGEAL ECHOCARDIOGRAM (TEE);  Surgeon: Thurmon FairMihai Croitoru, MD;  [facility];  Service: Cardiovascular;  Laterality: N/A;    Family History  Problem Relation Age of Onset  . Stomach cancer Mother   . High blood pressure Mother   . Diabetes Mother   .  Diabetes Maternal Grandfather   . High blood pressure Maternal Grandfather     Social History   Socioeconomic History  . Marital status: Married    Spouse name: Not on file  . Number of children: 3  . Years of education: MBA  . Highest education level: Not on file  Occupational History    Employer: [employer]    Comment: [employer]  Social Needs  . Financial resource strain: Not on file  . Food insecurity    Worry: Not on file    Inability: Not on file  . Transportation needs    Medical: Not on file    Non-medical: Not on file  Tobacco Use  . Smoking status: Never Smoker  . Smokeless tobacco: Never Used  Substance and Sexual Activity  . Alcohol use: No    Alcohol/week: 1.0 standard drinks    Types: 1 Standard drinks or equivalent per week    Comment: Social  . Drug use: No  . Sexual activity: Not on file  Lifestyle  . Physical activity    Days per week: Not on file    Minutes per session: Not on file  . Stress: Not on file  Relationships  . Social Musicianconnections    Talks on phone: Not on file    Gets together: Not on file    Attends religious service: Not on file    Active member of  club or organization: Not on file    Attends meetings of clubs or organizations: Not on file    Relationship status: Not on file  . Intimate partner violence    Fear of current or ex partner: Not on file    Emotionally abused: Not on file    Physically abused: Not on file    Forced sexual activity: Not on file  Other Topics Concern  . Not on file  Social History Narrative   Patient lives at home with [name].   Patient works full time [employer]  education MBA   Right handed   Caffeine two cups daily.          Outpatient Medications Prior to Visit  Medication Sig Dispense Refill  . amLODipine (NORVASC) 5 MG tablet Take 1 tablet (5 mg total) by mouth daily. 90 tablet 3  . aspirin EC 81 MG tablet Take 81 mg by mouth at bedtime.    [name]  atorvastatin (LIPITOR) 40 MG tablet Take 1 tablet (40 mg total) by mouth daily. 90 tablet 3  . Cholecalciferol (VITAMIN D3) 3000 units TABS Take 3,000 Units by mouth at bedtime.    . Coenzyme Q10 (COQ10) 200 MG CAPS Take 200 mg by mouth at bedtime.    . Continuous Blood Gluc Sensor (FREESTYLE LIBRE 14 DAY SENSOR) MISC 1 Device by Does not apply route every 14 (fourteen) days. 6 each 3  . dapagliflozin propanediol (FARXIGA) 10 MG TABS tablet Take 10 mg by mouth daily. 90 tablet 3  . Ginkgo Biloba 500 MG CAPS Take 500 mg by mouth at bedtime.     [name] glucose blood (ONETOUCH VERIO) test strip 1 each by Other route as needed for other. Use as instructed    . lisinopril (PRINIVIL,ZESTRIL) 40 MG tablet Take 1 tablet (40 mg total) by mouth at bedtime. 30 tablet 5  . metFORMIN (GLUCOPHAGE-XR) 500 MG 24 hr tablet Take 4 tablets (2,000 mg total) by mouth daily. 360 tablet 3  . Multiple Vitamin (MULTIVITAMIN WITH MINERALS) TABS tablet Take 1 tablet by mouth daily.    . Omega-3 Fatty Acids (OMEGA-3 FISH OIL) 1200 MG CAPS Take 1,200 mg by mouth at bedtime.    . Semaglutide (RYBELSUS) 7 MG TABS Take 7 mg by mouth every morning. 90 tablet 3   No facility-administered medications prior to visit.     Allergies  Allergen Reactions  . Penicillins Other (See Comments)    Driving couldn't see and head felt heavy  Has patient had a PCN reaction causing immediate rash, facial/tongue/throat swelling, SOB or lightheadedness with hypotension: No Has patient had a PCN reaction causing severe rash involving mucus membranes or skin necrosis: No Has patient had a PCN reaction that required hospitalization: No Has patient had a PCN reaction occurring within the last 10 years: No If all of the above answers are "NO", then may proceed with Cephalosporin use.   . Other Other (See Comments)    Allergic to beef and eggplant causes rash on skin    ROS Review of Systems  Constitutional: Negative for chills, diaphoresis,  fatigue, fever and unexpected weight change.  HENT: Negative.   Eyes: Negative for photophobia and visual disturbance.  Respiratory: Negative.   Cardiovascular: Negative.   Gastrointestinal: Negative.   Endocrine: Negative for polyphagia and polyuria.  Musculoskeletal: Negative for arthralgias and myalgias.  Neurological: Negative.   Psychiatric/Behavioral: Negative.       Objective:  Physical Exam  Constitutional: She is oriented to person, place, and time. She appears well-developed and well-nourished. No distress.  HENT:  Head: Normocephalic and atraumatic.  Right Ear: External ear normal.  Left Ear: External ear normal.  Eyes: Conjunctivae are normal. Right eye exhibits no discharge. Left eye exhibits no discharge. No scleral icterus.  Neck: No JVD present. No tracheal deviation present.  Cardiovascular: Normal rate, regular rhythm and normal heart sounds.  Pulmonary/Chest: Effort normal and breath sounds normal. No stridor.  Musculoskeletal:        General: No edema.  Neurological: She is alert and oriented to person, place, and time.  Skin: Skin is warm and dry. She is not diaphoretic.  Psychiatric: She has a normal mood and affect. Her behavior is normal.    BP 120/70   Pulse 100   Ht 5' 1.5" (1.562 m)   Wt 125 lb 2 oz (56.8 kg)   SpO2 97%   BMI 23.26 kg/m  Wt Readings from Last 3 Encounters:  03/06/19 125 lb 2 oz (56.8 kg)  02/15/19 125 lb (56.7 kg)  02/09/19 129 lb 6.4 oz (58.7 kg)   BP Readings from Last 3 Encounters:  03/06/19 120/70  02/15/19 (!) 141/78  02/09/19 136/78   Guideline developer:  UpToDate (see UpToDate for funding source) Date Released: June 2014  Health Maintenance Due  Topic Date Due  . Hepatitis C Screening  September 17, 1955  . HIV Screening  05/27/1969  . TETANUS/TDAP  05/27/1973  . MAMMOGRAM  05/27/2004  . COLONOSCOPY  05/27/2004    There are no preventive care reminders to display for this patient.  Lab Results  Component  Value Date   TSH 2.42 10/13/2018   Lab Results  Component Value Date   WBC 9.7 05/25/2016   HGB 12.5 05/25/2016   HCT 37.9 05/25/2016   MCV 83.7 05/25/2016   PLT 165 05/25/2016   Lab Results  Component Value Date   NA 141 10/13/2018   K 4.7 10/13/2018   CO2 28 10/13/2018   GLUCOSE 135 (H) 10/13/2018   BUN 13 10/13/2018   CREATININE 0.87 10/13/2018   BILITOT 0.4 10/13/2018   ALKPHOS 70 10/13/2018   AST 16 10/13/2018   ALT 25 10/13/2018   PROT 7.4 10/13/2018   ALBUMIN 4.4 10/13/2018   CALCIUM 9.5 10/13/2018   ANIONGAP 11 05/21/2016   GFR 65.47 10/13/2018   Lab Results  Component Value Date   CHOL 156 10/13/2018   Lab Results  Component Value Date   HDL 47.10 10/13/2018   Lab Results  Component Value Date   LDLCALC 90 10/13/2018   Lab Results  Component Value Date   TRIG 96.0 10/13/2018   Lab Results  Component Value Date   CHOLHDL 3 10/13/2018   Lab Results  Component Value Date   HGBA1C 7.2 (A) 02/09/2019      Assessment & Plan:   Problem List Items Addressed This Visit      Cardiovascular and Mediastinum   Hypertensive heart disease (Chronic)   Relevant Orders   Lipid Profile   Ambulatory referral to Cardiology     Other   Hyperlipidemia (Chronic)   Relevant Orders   Lipid Profile   History of CVA (cerebrovascular accident) (multiple, recurrent) - Primary (Chronic)   Relevant Orders   Lipid Profile    Other Visit Diagnoses    Essential hypertension          No orders of the defined types were placed in this encounter.  Follow-up: Return in about 6 months (around 09/03/2019).   Continue high-dose Lipitor.  Discussed her mentally challenged son's elevated LDL.  They will continue to work on his diet.

## 2019-03-07 LAB — LIPID PANEL
Cholesterol: 159 mg/dL (ref 0–200)
HDL: 46.7 mg/dL (ref >39.00)
LDL Cholesterol: 77 mg/dL (ref 0–99)
NonHDL: 111.81
Total CHOL/HDL Ratio: 3
Triglycerides: 174 mg/dL — ABNORMAL HIGH (ref 0.0–149.0)
VLDL: 34.8 mg/dL (ref 0.0–40.0)

## 2019-03-22 ENCOUNTER — Telehealth: Payer: Self-pay | Admitting: Cardiology

## 2019-03-22 NOTE — Telephone Encounter (Signed)
  Dr Bettina Gavia, Ms Gass's PCP has sent a referral to the [facility] office. I called patient to see if she would like to make appt with you in [facility] and she stated she wants to switch to the [place] location. I just need your approval for her switch. Thanks

## 2019-03-22 NOTE — Telephone Encounter (Signed)
OK thank you 

## 2019-03-29 ENCOUNTER — Other Ambulatory Visit: Payer: Self-pay

## 2019-03-29 ENCOUNTER — Ambulatory Visit (HOSPITAL_BASED_OUTPATIENT_CLINIC_OR_DEPARTMENT_OTHER)
Admission: RE | Admit: 2019-03-29 | Discharge: 2019-03-29 | Disposition: A | Discharge status: Home / Self Care | Payer: BC Managed Care – PPO | Source: Ambulatory Visit | Attending: Obstetrics & Gynecology | Admitting: Obstetrics & Gynecology

## 2019-03-29 DIAGNOSIS — Z01419 Encounter for gynecological examination (general) (routine) without abnormal findings: Secondary | ICD-10-CM

## 2019-03-29 DIAGNOSIS — Z1239 Encounter for other screening for malignant neoplasm of breast: Secondary | ICD-10-CM | POA: Diagnosis present

## 2019-03-29 DIAGNOSIS — Z1231 Encounter for screening mammogram for malignant neoplasm of breast: Secondary | ICD-10-CM | POA: Diagnosis not present

## 2019-04-03 ENCOUNTER — Other Ambulatory Visit: Payer: Self-pay | Admitting: Obstetrics & Gynecology

## 2019-04-03 DIAGNOSIS — R928 Other abnormal and inconclusive findings on diagnostic imaging of breast: Secondary | ICD-10-CM

## 2019-04-14 ENCOUNTER — Other Ambulatory Visit: Payer: Self-pay

## 2019-04-17 ENCOUNTER — Ambulatory Visit
Admission: RE | Admit: 2019-04-17 | Discharge: 2019-04-17 | Disposition: A | Discharge status: Home / Self Care | Payer: BC Managed Care – PPO | Source: Ambulatory Visit | Attending: Obstetrics & Gynecology | Admitting: Obstetrics & Gynecology

## 2019-04-17 ENCOUNTER — Other Ambulatory Visit: Payer: Self-pay | Admitting: Obstetrics & Gynecology

## 2019-04-17 ENCOUNTER — Other Ambulatory Visit: Payer: Self-pay

## 2019-04-17 ENCOUNTER — Ambulatory Visit: Payer: BC Managed Care – PPO | Admitting: Endocrinology

## 2019-04-17 DIAGNOSIS — N6310 Unspecified lump in the right breast, unspecified quadrant: Secondary | ICD-10-CM

## 2019-04-17 DIAGNOSIS — R928 Other abnormal and inconclusive findings on diagnostic imaging of breast: Secondary | ICD-10-CM

## 2019-04-18 ENCOUNTER — Ambulatory Visit: Payer: BC Managed Care – PPO | Admitting: Endocrinology

## 2019-04-19 ENCOUNTER — Ambulatory Visit: Payer: BC Managed Care – PPO | Admitting: Endocrinology

## 2019-04-24 ENCOUNTER — Ambulatory Visit: Payer: BC Managed Care – PPO | Admitting: Cardiovascular Disease

## 2019-05-01 ENCOUNTER — Ambulatory Visit: Payer: BC Managed Care – PPO | Admitting: Cardiology

## 2019-05-01 ENCOUNTER — Encounter: Payer: Self-pay | Admitting: Cardiology

## 2019-05-01 ENCOUNTER — Other Ambulatory Visit: Payer: Self-pay

## 2019-05-01 VITALS — BP 131/78 | HR 88 | Temp 97.9°F | Ht 61.5 in | Wt 123.0 lb

## 2019-05-01 DIAGNOSIS — E119 Type 2 diabetes mellitus without complications: Secondary | ICD-10-CM

## 2019-05-01 DIAGNOSIS — Z8673 Personal history of transient ischemic attack (TIA), and cerebral infarction without residual deficits: Secondary | ICD-10-CM

## 2019-05-01 DIAGNOSIS — Z7182 Exercise counseling: Secondary | ICD-10-CM

## 2019-05-01 DIAGNOSIS — I10 Essential (primary) hypertension: Secondary | ICD-10-CM

## 2019-05-01 DIAGNOSIS — Z7982 Long term (current) use of aspirin: Secondary | ICD-10-CM

## 2019-05-01 DIAGNOSIS — Z8249 Family history of ischemic heart disease and other diseases of the circulatory system: Secondary | ICD-10-CM

## 2019-05-01 DIAGNOSIS — Z7189 Other specified counseling: Secondary | ICD-10-CM

## 2019-05-01 DIAGNOSIS — Z95818 Presence of other cardiac implants and grafts: Secondary | ICD-10-CM

## 2019-05-01 NOTE — Progress Notes (Signed)
Cardiology Office Note:    Date:  05/01/2019   ID:  Marie [name], DOB [birth year], MRN [id number]  PCP:  Libby Maw, MD  Cardiologist:  Buford Dresser, MD  Referring MD: Libby Maw,*   CC: new patient to me/change of providers  History of Present Illness:    Marie [name] is a 64 y.o. female with a hx of CVA, hypertension, type II diabetes, dyslipidemia, carotid stenosis who is seen as a new patient to me/prior patient of Dr. Bettina Gavia for the evaluation and management of CVA and hypertension.  She was last seen by Dr. Bettina Gavia on 02/04/2018. She has a history of CVA. Workup unrevealing, loop recorder has not shown atrial fibrillation during the entire life of the device (implanted 2015). Her hypertension was well controlled on ACEi and amlodipine.  Today: Daugther Ival Bible present via conference call today.   Main concern: loop recorder in place but out of battery. Does it need removed? Discussed this today at length. My recommendation would be to leave it in place, no indication to replace as she has not had afib on the monitor, no further strokes.  History of CVA: Can she shorten her medication list? What medications does she need to be on? -stay on aspirin and atorvastatin, these are guideline recommended medications -ok trial stopping CoQ10, no history of muscle aches on atorvastatin -ok to stop omega 3 from heart standpoint (no comment on other benefits, like the brain)  Hypertension: lisinopril 40 mg daily, amlodipine 5 mg. Takes all meds at night. No routine BP check at home.  Diabetes: on farxiga, rybelsus, metformin -discussed cardiovascular benefit of SLGT2i and GLP1RA, and she is on both at this time.  Denies chest pain, shortness of breath at rest or with normal exertion. No PND, orthopnea, LE edema or unexpected weight gain. No syncope or palpitations. Occasional lightheadedness with rapid position changes.   Discussed covid vaccine at length. 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 I unfortunately do not have a way to speed this process up for her, but I encouraged her to keep trying.  Past Medical History:  Diagnosis Date  . CVA (cerebral vascular accident) ([place]) 2015  . Depression   . Diabetes ([place])    t2  . Gallstones   . Hyperlipemia   . Hypertension   . Migraines     Past Surgical History:  Procedure Laterality Date  . CESAREAN SECTION     x2  . CHOLECYSTECTOMY N/A 05/25/2016   Procedure: LAPAROSCOPIC CHOLECYSTECTOMY WITH INTRAOPERATIVE CHOLANGIOGRAM;  Surgeon: Johnathan Hausen, MD;  Location: [facility];  Service: General;  Laterality: N/A;  . IR GENERIC HISTORICAL  03/13/2016   IR PERC CHOLECYSTOSTOMY 03/13/2016 Sandi Mariscal, MD [facility]  . IR GENERIC HISTORICAL  04/28/2016   IR RADIOLOGIST EVAL & MGMT 04/28/2016 Jacqulynn Cadet, MD [facility]  . LOOP RECORDER IMPLANT N/A 11/29/2013   Procedure: LOOP RECORDER IMPLANT;  Surgeon: Evans Lance, MD;  Location: [facility] CATH LAB;  Service: Cardiovascular;  Laterality: N/A;  . TEE WITHOUT CARDIOVERSION N/A 08/24/2013   Procedure: TRANSESOPHAGEAL ECHOCARDIOGRAM (TEE);  Surgeon: Sanda Klein, MD;  Location: [facility] ENDOSCOPY;  Service: Cardiovascular;  Laterality: N/A;    Current Medications: Current Outpatient Medications on File Prior to Visit  Medication Sig  . amLODipine (NORVASC) 5 MG tablet Take 1 tablet (5 mg total) by mouth daily.  [name] aspirin EC 81 MG tablet Take 81 mg  by mouth at bedtime.  [name] atorvastatin (LIPITOR) 40 MG tablet Take 1 tablet (40 mg total) by mouth daily.  . Cholecalciferol (VITAMIN D3) 3000 units TABS Take 3,000 Units by mouth at bedtime.  . Coenzyme Q10 (COQ10) 200 MG CAPS Take 200 mg by mouth at bedtime.  . Continuous Blood Gluc Sensor (FREESTYLE LIBRE 14 DAY SENSOR) MISC 1 Device by Does not apply route every 14 (fourteen) days.  . dapagliflozin propanediol (FARXIGA) 10 MG TABS tablet Take 10  mg by mouth daily.  . Ginkgo Biloba 500 MG CAPS Take 500 mg by mouth at bedtime.   [name] glucose blood (ONETOUCH VERIO) test strip 1 each by Other route as needed for other. Use as instructed  . lisinopril (PRINIVIL,ZESTRIL) 40 MG tablet Take 1 tablet (40 mg total) by mouth at bedtime.  . metFORMIN (GLUCOPHAGE-XR) 500 MG 24 hr tablet Take 4 tablets (2,000 mg total) by mouth daily.  . Multiple Vitamin (MULTIVITAMIN WITH MINERALS) TABS tablet Take 1 tablet by mouth daily.  . Omega-3 Fatty Acids (OMEGA-3 FISH OIL) 1200 MG CAPS Take 1,200 mg by mouth at bedtime.  . Semaglutide (RYBELSUS) 7 MG TABS Take 7 mg by mouth every morning.   No current facility-administered medications on file prior to visit.     Allergies:   Penicillins and Other   Social History   Tobacco Use  . Smoking status: Never Smoker  . Smokeless tobacco: Never Used  Substance Use Topics  . Alcohol use: No    Alcohol/week: 1.0 standard drinks    Types: 1 Standard drinks or equivalent per week    Comment: Social  . Drug use: No    Family History: family history includes Diabetes in her maternal grandfather and mother; High blood pressure in her maternal grandfather and mother; Stomach cancer in her mother.  ROS:   Please see the history of present illness.  Additional pertinent ROS: Constitutional: Negative for chills, fever, night sweats, unintentional weight loss  HENT: Negative for ear pain and hearing loss.   Eyes: Negative for loss of vision and eye pain.  Respiratory: Negative for cough, sputum, wheezing.   Cardiovascular: See HPI. Gastrointestinal: Negative for abdominal pain, melena, and hematochezia.  Genitourinary: Negative for dysuria and hematuria.  Musculoskeletal: Negative for falls and myalgias.  Skin: Negative for itching and rash.  Neurological: Negative for focal weakness, focal sensory changes and loss of consciousness.  Endo/Heme/Allergies: Does not bruise/bleed easily.     EKGs/Labs/Other  Studies Reviewed:    The following studies were reviewed today: TEE 08-28-13 - Left ventricle: Systolic function was normal. Wall motion was  normal; there were no regional wall motion abnormalities.  - Left atrium: No evidence of thrombus in the atrial cavity or  appendage.  - Right atrium: No evidence of thrombus in the atrial cavity or  appendage.  - Atrial septum: No defect or patent foramen ovale was identified.  Echo contrast study showed no right-to-left atrial level shunt,  at baseline or with provocation. Echo contrast study showed no  right-to-left shunt, following an increase in RA pressure induced  by provocative maneuvers.    EKG:  EKG is personally reviewed.  The ekg ordered today demonstrates sinus rhythm with sinus arrhythmia  Recent Labs: 10/13/2018: ALT 25; BUN 13; Creatinine, Ser 0.87; Potassium 4.7; Sodium 141; TSH 2.42  Recent Lipid Panel    Component Value Date/Time   CHOL 159 03/06/2019 1331   TRIG 174.0 (H) 03/06/2019 1331   HDL 46.70 03/06/2019 1331  CHOLHDL 3 03/06/2019 1331   VLDL 34.8 03/06/2019 1331   LDLCALC 77 03/06/2019 1331    Physical Exam:    VS:  BP 131/78   Pulse 88   Temp 97.9 F (36.6 C)   Ht 5' 1.5" (1.562 m)   Wt 123 lb (55.8 kg)   SpO2 99%   BMI 22.86 kg/m     Wt Readings from Last 3 Encounters:  05/01/19 123 lb (55.8 kg)  03/06/19 125 lb 2 oz (56.8 kg)  02/15/19 125 lb (56.7 kg)    GEN: Well nourished, well developed in no acute distress HEENT: Normal, moist mucous membranes NECK: No JVD CARDIAC: regular rhythm, normal S1 and S2, no rubs or gallops. No murmurs. VASCULAR: Radial and DP pulses 2+ bilaterally. No carotid bruits RESPIRATORY:  Clear to auscultation without rales, wheezing or rhonchi  ABDOMEN: Soft, non-tender, non-distended MUSCULOSKELETAL:  Ambulates independently SKIN: Warm and dry, no edema NEUROLOGIC:  Alert and oriented x 3. No focal neuro deficits noted. PSYCHIATRIC:  Normal affect     ASSESSMENT:    1. History of CVA (cerebrovascular accident)   2. Essential hypertension   3. Type 2 diabetes mellitus without obesity (HCC)   4. Cardiac risk counseling   5. Counseling on health promotion and disease prevention   6. Educated about COVID-19 virus infection    PLAN:    History of CVA: no further events -I showed her the chest X ray that shows the loop recorder location. She is not bothered by it. I recommended leaving it in place unless it becomes a problem -no further strokes -no detection of afib on loop -continue aspirin, atorvastatin  Hypertension: goal <130/80, just at goal today -continue amlodipine 5 mg, lisinopril 40 mg daily  Type II diabetes, without obesity, with history of ASCVD: -she is already on both SGLT2i and GLP1RA  Cardiac risk counseling and secondary prevention recommendations: -recommend heart healthy/Mediterranean diet, with whole grains, fruits, vegetable, fish, lean meats, nuts, and olive oil. Limit salt. -recommend moderate walking, 3-5 times/week for 30-50 minutes each session. Aim for at least 150 minutes.week. Goal should be pace of 3 miles/hours, or walking 1.5 miles in 30 minutes -recommend avoidance of tobacco products. Avoid excess alcohol.  Covid education: I agree with her plans to get vaccine. I unfortunately cannot speed the process up for her but encouraged her to keep trying to get vaccine appt.  Plan for follow up: 6 mos or sooner PRN  Total time of encounter: 42 minutes total time of encounter, including 29 minutes spent in face-to-face patient care. This time includes coordination of care and counseling regarding CVA history, Covid, guidelines, patient questions. Remainder of non-face-to-face time involved reviewing chart documents/testing relevant to the patient encounter and documentation in the medical record.  Time in 3:46, Time out 4:15, total F2F 29 min  Jodelle Red, MD, PhD [place]  [facility]     Medication Adjustments/Labs and Tests Ordered: Current medicines are reviewed at length with the patient today.  Concerns regarding medicines are outlined above.  Orders Placed This Encounter  Procedures  . EKG 12-Lead   No orders of the defined types were placed in this encounter.   Patient Instructions  Medication Instructions:  Your Physician recommend you continue on your current medication as directed.    CVA: stay on aspirin and atorvastatin -ok trial stopping CoQ10, no history of muscle aches. -ok to stop omega 3 from heart standpoint (no comment on other benefits, like the brain). Triglycerides  are borderline. No evidence for over the counter omega 3 for this. We will recheck on follow up.  *If you need a refill on your cardiac medications before your next appointment, please call your pharmacy*  Lab Work: None  Testing/Procedures: None  Follow-Up: At [facility], you and your health needs are our priority.  As part of our continuing mission to provide you with exceptional heart care, we have created designated Provider Care Teams.  These Care Teams include your primary Cardiologist (physician) and Advanced Practice Providers (APPs -  Physician Assistants and Nurse Practitioners) who all work together to provide you with the care you need, when you need it.  Your next appointment:   6 month(s)  The format for your next appointment:   In Person  Provider:   Jodelle Red, MD  Other Instructions how to check blood pressure:  -sit comfortably in a chair, feet uncrossed and flat on floor, for 5-10 minutes  -arm ideally should rest at the level of the heart. However, arm should be relaxed and not tense (for example, do not hold the arm up unsupported)  -avoid exercise, caffeine, and tobacco for at least 30 minutes prior to BP reading  -don't take BP cuff reading over clothes (always place on skin directly)     Signed, Jodelle Red, MD  PhD 05/01/2019  [facility]

## 2019-05-01 NOTE — Patient Instructions (Addendum)
Medication Instructions:  Your Physician recommend you continue on your current medication as directed.    CVA: stay on aspirin and atorvastatin -ok trial stopping CoQ10, no history of muscle aches. -ok to stop omega 3 from heart standpoint (no comment on other benefits, like the brain). Triglycerides are borderline. No evidence for over the counter omega 3 for this. We will recheck on follow up.  *If you need a refill on your cardiac medications before your next appointment, please call your pharmacy*  Lab Work: None  Testing/Procedures: None  Follow-Up: At [facility], you and your health needs are our priority.  As part of our continuing mission to provide you with exceptional heart care, we have created designated Provider Care Teams.  These Care Teams include your primary Cardiologist (physician) and Advanced Practice Providers (APPs -  Physician Assistants and Nurse Practitioners) who all work together to provide you with the care you need, when you need it.  Your next appointment:   6 month(s)  The format for your next appointment:   In Person  Provider:   Jodelle Red, MD  Other Instructions how to check blood pressure:  -sit comfortably in a chair, feet uncrossed and flat on floor, for 5-10 minutes  -arm ideally should rest at the level of the heart. However, arm should be relaxed and not tense (for example, do not hold the arm up unsupported)  -avoid exercise, caffeine, and tobacco for at least 30 minutes prior to BP reading  -don't take BP cuff reading over clothes (always place on skin directly)

## 2019-05-07 ENCOUNTER — Encounter: Payer: Self-pay | Admitting: Cardiology

## 2019-05-07 DIAGNOSIS — I10 Essential (primary) hypertension: Secondary | ICD-10-CM | POA: Insufficient documentation

## 2019-05-07 DIAGNOSIS — E119 Type 2 diabetes mellitus without complications: Secondary | ICD-10-CM | POA: Insufficient documentation

## 2019-05-07 DIAGNOSIS — Z0279 Encounter for issue of other medical certificate: Secondary | ICD-10-CM

## 2019-05-08 ENCOUNTER — Other Ambulatory Visit: Payer: Self-pay | Admitting: Obstetrics & Gynecology

## 2019-05-08 ENCOUNTER — Ambulatory Visit
Admission: RE | Admit: 2019-05-08 | Discharge: 2019-05-08 | Disposition: A | Discharge status: Home / Self Care | Payer: Medicare Other | Source: Ambulatory Visit | Attending: Obstetrics & Gynecology | Admitting: Obstetrics & Gynecology

## 2019-05-08 ENCOUNTER — Other Ambulatory Visit: Payer: Self-pay

## 2019-05-08 DIAGNOSIS — N6310 Unspecified lump in the right breast, unspecified quadrant: Secondary | ICD-10-CM

## 2019-05-08 DIAGNOSIS — R928 Other abnormal and inconclusive findings on diagnostic imaging of breast: Secondary | ICD-10-CM

## 2019-05-08 HISTORY — PX: BREAST BIOPSY: SHX20

## 2019-05-18 ENCOUNTER — Telehealth: Payer: Self-pay

## 2019-05-18 NOTE — Telephone Encounter (Signed)
Pt aware of message below and will follow through with getting scheduled for colonoscopy in 2022

## 2019-05-18 NOTE — Telephone Encounter (Signed)
Pt called and would like a call back to let her know if we have it or when she needs to do it next, please advise

## 2019-05-18 NOTE — Telephone Encounter (Signed)
She had the cologuard test in 2019. I would like for her to have a colonoscopy in 2022.

## 2019-05-18 NOTE — Telephone Encounter (Signed)
Pt called the office wanting to know if we have record of her Cologuard test. Pt also wanted to know when should she have another test done.Advised pt to talk to her PCP regarding her Cologuard test. Understanding was voiced. Marie [name] l [name], CMA

## 2019-05-26 ENCOUNTER — Other Ambulatory Visit: Payer: Self-pay

## 2019-05-29 ENCOUNTER — Other Ambulatory Visit: Payer: Self-pay

## 2019-05-29 ENCOUNTER — Encounter: Payer: Self-pay | Admitting: Endocrinology

## 2019-05-29 ENCOUNTER — Ambulatory Visit (INDEPENDENT_AMBULATORY_CARE_PROVIDER_SITE_OTHER): Payer: Medicare Other | Admitting: Endocrinology

## 2019-05-29 VITALS — BP 102/70 | HR 104 | Ht 61.5 in | Wt 128.4 lb

## 2019-05-29 DIAGNOSIS — E11319 Type 2 diabetes mellitus with unspecified diabetic retinopathy without macular edema: Secondary | ICD-10-CM

## 2019-05-29 LAB — POCT GLYCOSYLATED HEMOGLOBIN (HGB A1C): Hemoglobin A1C: 7 % — AB (ref 4.0–5.6)

## 2019-05-29 MED ORDER — RYBELSUS 14 MG PO TABS: 14.0000 mg | ORAL_TABLET | ORAL | 11 refills | Status: DC

## 2019-05-29 NOTE — Patient Instructions (Addendum)
I have also sent a prescription to your pharmacy, to increase the Rybelsus.  Please continue the same other diabetes medications.   check your blood sugar once a day.  vary the time of day when you check, between before the 3 meals, and at bedtime.  also check if you have symptoms of your blood sugar being too high or too low.  please keep a record of the readings and bring it to your next appointment here (or you can bring the meter itself).  You can write it on any piece of paper.  please call us sooner if your blood sugar goes below 70, or if you have a lot of readings over 200. Please come back for a follow-up appointment in 2-3 months.

## 2019-05-29 NOTE — Progress Notes (Signed)
Subjective:    Patient ID: Marie [name], female    DOB: [birth year], 64 y.o.   MRN: [id number]  HPI Pt returns for f/u of diabetes mellitus: DM type: 2  Dx'ed: 8416 Complications: polyneuropathy, CVA, and DR.   Therapy: 3 oral meds.  GDM: never DKA: never Severe hypoglycemia: never Pancreatitis: never Pancreatic imaging (2017) CT: calcification of the pancreatic tail.  Other: she has freestyle libre continuous glucose monitor; she has never been on insulin.   Interval history: She takes 3 meds as rx'ed.  She says cbg's are well-controlled.  pt states she feels well in general.    Past Medical History:  Diagnosis Date  . CVA (cerebral vascular accident) ([place]) 2015  . Depression   . Diabetes ([place])    t2  . Gallstones   . Hyperlipemia   . Hypertension   . Migraines     Past Surgical History:  Procedure Laterality Date  . CESAREAN SECTION     x2  . CHOLECYSTECTOMY N/A 05/25/2016   Procedure: LAPAROSCOPIC CHOLECYSTECTOMY WITH INTRAOPERATIVE CHOLANGIOGRAM;  Surgeon: Johnathan Hausen, MD;  Location: [facility];  Service: General;  Laterality: N/A;  . IR GENERIC HISTORICAL  03/13/2016   IR PERC CHOLECYSTOSTOMY 03/13/2016 Sandi Mariscal, MD [facility]  . IR GENERIC HISTORICAL  04/28/2016   IR RADIOLOGIST EVAL & MGMT 04/28/2016 Jacqulynn Cadet, MD [facility]  . LOOP RECORDER IMPLANT N/A 11/29/2013   Procedure: LOOP RECORDER IMPLANT;  Surgeon: Evans Lance, MD;  Location: [facility] CATH LAB;  Service: Cardiovascular;  Laterality: N/A;  . TEE WITHOUT CARDIOVERSION N/A 08/24/2013   Procedure: TRANSESOPHAGEAL ECHOCARDIOGRAM (TEE);  Surgeon: Sanda Klein, MD;  Location: [facility] ENDOSCOPY;  Service: Cardiovascular;  Laterality: N/A;    Social History   Socioeconomic History  . Marital status: Married    Spouse name: Not on file  . Number of children: 3  . Years of education: MBA  . Highest education level: Not on file  Occupational History    Employer: [employer]    Comment: [place]    Tobacco Use  . Smoking status: Never Smoker  . Smokeless tobacco: Never Used  Substance and Sexual Activity  . Alcohol use: No    Alcohol/week: 1.0 standard drinks    Types: 1 Standard drinks or equivalent per week    Comment: Social  . Drug use: No  . Sexual activity: Not on file  Other Topics Concern  . Not on file  Social History Narrative   Patient lives at home with [name].   Patient works full time [employer]  education MBA   Right handed   Caffeine two cups daily.         Social Determinants of Health   Financial Resource Strain:   . Difficulty of Paying Living Expenses: Not on file  Food Insecurity:   . Worried About Charity fundraiser in the Last Year: Not on file  . Ran Out of Food in the Last Year: Not on file  Transportation Needs:   . Lack of Transportation (Medical): Not on file  . Lack of Transportation (Non-Medical): Not on file  Physical Activity:   . Days of Exercise per Week: Not on file  . Minutes of Exercise per Session: Not on file  Stress:   . Feeling of Stress : Not on file  Social Connections:   . Frequency of Communication with Friends and Family: Not on file  . Frequency of  Social Gatherings with Friends and Family: Not on file  . Attends Religious Services: Not on file  . Active Member of Clubs or Organizations: Not on file  . Attends Banker Meetings: Not on file  . Marital Status: Not on file  Intimate Partner Violence:   . Fear of Current or Ex-Partner: Not on file  . Emotionally Abused: Not on file  . Physically Abused: Not on file  . Sexually Abused: Not on file    Current Outpatient Medications on File Prior to Visit  Medication Sig Dispense Refill  . amLODipine (NORVASC) 5 MG tablet Take 1 tablet (5 mg total) by mouth daily. 90 tablet 3  . aspirin EC 81 MG tablet Take 81 mg by mouth at bedtime.    [name] atorvastatin (LIPITOR) 40 MG tablet Take 1 tablet (40 mg total) by mouth daily.  90 tablet 3  . Cholecalciferol (VITAMIN D3) 3000 units TABS Take 3,000 Units by mouth at bedtime.    . Coenzyme Q10 (COQ10) 200 MG CAPS Take 200 mg by mouth at bedtime.    . Continuous Blood Gluc Sensor (FREESTYLE LIBRE 14 DAY SENSOR) MISC 1 Device by Does not apply route every 14 (fourteen) days. 6 each 3  . dapagliflozin propanediol (FARXIGA) 10 MG TABS tablet Take 10 mg by mouth daily. 90 tablet 3  . Ginkgo Biloba 500 MG CAPS Take 500 mg by mouth at bedtime.     [name] glucose blood (ONETOUCH VERIO) test strip 1 each by Other route as needed for other. Use as instructed    . lisinopril (PRINIVIL,ZESTRIL) 40 MG tablet Take 1 tablet (40 mg total) by mouth at bedtime. 30 tablet 5  . metFORMIN (GLUCOPHAGE-XR) 500 MG 24 hr tablet Take 4 tablets (2,000 mg total) by mouth daily. 360 tablet 3  . Multiple Vitamin (MULTIVITAMIN WITH MINERALS) TABS tablet Take 1 tablet by mouth daily.    . Omega-3 Fatty Acids (OMEGA-3 FISH OIL) 1200 MG CAPS Take 1,200 mg by mouth at bedtime.     No current facility-administered medications on file prior to visit.    Allergies  Allergen Reactions  . Penicillins Other (See Comments)    Driving couldn't see and head felt heavy  Has patient had a PCN reaction causing immediate rash, facial/tongue/throat swelling, SOB or lightheadedness with hypotension: No Has patient had a PCN reaction causing severe rash involving mucus membranes or skin necrosis: No Has patient had a PCN reaction that required hospitalization: No Has patient had a PCN reaction occurring within the last 10 years: No If all of the above answers are "NO", then may proceed with Cephalosporin use.   . Other Other (See Comments)    Allergic to beef and eggplant causes rash on skin    Family History  Problem Relation Age of Onset  . Stomach cancer Mother   . High blood pressure Mother   . Diabetes Mother   . Diabetes Maternal Grandfather   . High blood pressure Maternal Grandfather     BP 102/70 (BP  Location: Left Arm, Patient Position: Sitting, Cuff Size: Normal)   Pulse (!) 104   Ht 5' 1.5" (1.562 m)   Wt 128 lb 6.4 oz (58.2 kg)   SpO2 98%   BMI 23.87 kg/m    Review of Systems Denies nausea.      Objective:   Physical Exam VITAL SIGNS:  See vs page GENERAL: no distress Pulses: dorsalis pedis intact bilat.   MSK: no deformity of the feet  CV: no leg edema Skin:  no ulcer on the feet.  normal color and temp on the feet. Neuro: sensation is intact to touch on the feet  Lab Results  Component Value Date   HGBA1C 7.0 (A) 05/29/2019   Lab Results  Component Value Date   CREATININE 0.87 10/13/2018   BUN 13 10/13/2018   NA 141 10/13/2018   K 4.7 10/13/2018   CL 103 10/13/2018   CO2 28 10/13/2018       Assessment & Plan:  Type 2 DM: She would benefit from increased rx, if it can be done with a regimen that avoids or minimizes hypoglycemia.     Patient Instructions  I have also sent a prescription to your pharmacy, to increase the Rybelsus.  Please continue the same other diabetes medications.   check your blood sugar once a day.  vary the time of day when you check, between before the 3 meals, and at bedtime.  also check if you have symptoms of your blood sugar being too high or too low.  please keep a record of the readings and bring it to your next appointment here (or you can bring the meter itself).  You can write it on any piece of paper.  please call us sooner if your blood sugar goes below 70, or if you have a lot of readings over 200. Please come back for a follow-up appointment in 2-3 months.

## 2019-05-30 ENCOUNTER — Telehealth: Payer: Self-pay

## 2019-05-30 NOTE — Telephone Encounter (Signed)
Patient called today stating she was confused on what she should use to share data for her Freestyle Libre-after speaking with her I made her aware that she has to stick with either her phone or the reader she will need to choose one-patient chose to use the reader and not her cell phone for this-FYI

## 2019-06-01 ENCOUNTER — Ambulatory Visit: Payer: Medicare Other | Attending: Internal Medicine

## 2019-06-01 DIAGNOSIS — Z23 Encounter for immunization: Secondary | ICD-10-CM

## 2019-06-01 NOTE — Progress Notes (Signed)
   Covid-19 Vaccination Clinic  Name:  Marie [name]    MRN: [id number] DOB: 04-13-1955  06/01/2019  Marie [name] was observed post Covid-19 immunization for 15 minutes without incidence. She was provided with Vaccine Information Sheet and instruction to access the V-Safe system.   Marie [name] was instructed to call 911 with any severe reactions post vaccine: [name] Difficulty breathing  . Swelling of your face and throat  . A fast heartbeat  . A bad rash all over your body  . Dizziness and weakness    Immunizations Administered    Name Date Dose VIS Date Route   Pfizer COVID-19 Vaccine 06/01/2019 12:12 PM 0.3 mL 03/17/2019 Intramuscular   Manufacturer: [employer], [employer]   Lot: [id number]   NDC: [address]-7

## 2019-06-05 ENCOUNTER — Telehealth: Payer: Self-pay | Admitting: Endocrinology

## 2019-06-05 NOTE — Telephone Encounter (Signed)
Will await pt returned call with covered alternatives

## 2019-06-05 NOTE — Telephone Encounter (Signed)
Marie [name] is too expensive for her is there an alternate that could be cheaper. Asked pt to contact insurance

## 2019-06-20 ENCOUNTER — Other Ambulatory Visit: Payer: Self-pay | Admitting: Endocrinology

## 2019-06-21 ENCOUNTER — Ambulatory Visit: Payer: Medicare Other

## 2019-06-22 ENCOUNTER — Other Ambulatory Visit: Payer: Self-pay

## 2019-06-23 ENCOUNTER — Ambulatory Visit (INDEPENDENT_AMBULATORY_CARE_PROVIDER_SITE_OTHER): Payer: Medicare Other | Admitting: Family Medicine

## 2019-06-23 ENCOUNTER — Ambulatory Visit: Payer: Medicare Other | Attending: Internal Medicine

## 2019-06-23 ENCOUNTER — Encounter: Payer: Self-pay | Admitting: Family Medicine

## 2019-06-23 VITALS — BP 120/72 | HR 95 | Temp 98.4°F | Ht 61.0 in | Wt 125.4 lb

## 2019-06-23 DIAGNOSIS — E11319 Type 2 diabetes mellitus with unspecified diabetic retinopathy without macular edema: Secondary | ICD-10-CM | POA: Diagnosis not present

## 2019-06-23 DIAGNOSIS — Z23 Encounter for immunization: Secondary | ICD-10-CM

## 2019-06-23 NOTE — Progress Notes (Signed)
   Covid-19 Vaccination Clinic  Name:  Marie [name]    MRN: [id number] DOB: December 05, 1955  06/23/2019  Ms. Marie [name] was observed post Covid-19 immunization for 30 minutes based on pre-vaccination screening without incident. She was provided with Vaccine Information Sheet and instruction to access the V-Safe system.   Ms. Marie [name] was instructed to call 911 with any severe reactions post vaccine: [name] Difficulty breathing  . Swelling of face and throat  . A fast heartbeat  . A bad rash all over body  . Dizziness and weakness   Immunizations Administered    Name Date Dose VIS Date Route   Pfizer COVID-19 Vaccine 06/23/2019  1:52 PM 0.3 mL 03/17/2019 Intramuscular   Manufacturer: [employer], [employer]   Lot: [id number]   NDC: [address]-9

## 2019-06-23 NOTE — Progress Notes (Signed)
Established Patient Office Visit  Subjective:  Patient ID: Marie [name], female    DOB: 24-Jun-1955  Age: 64 y.o. MRN: [id number]  CC:  Chief Complaint  Patient presents with  . Follow-up    follow up on possible sleep apenea     HPI Marie [name] presents for evaluation of apnea.  She is having visual difficulties and OD.  She does have a history of diabetic retinopathy.  She tells me that her ophthalmologist was concerned about the possibility of apnea.  Patient does not snore and feels rested in the morning her blood pressure is well controlled.  Past Medical History:  Diagnosis Date  . CVA (cerebral vascular accident) ([facility]) 2015  . Depression   . Diabetes ([facility])    t2  . Gallstones   . Hyperlipemia   . Hypertension   . Migraines     Past Surgical History:  Procedure Laterality Date  . CESAREAN SECTION     x2  . CHOLECYSTECTOMY N/A 05/25/2016   Procedure: LAPAROSCOPIC CHOLECYSTECTOMY WITH INTRAOPERATIVE CHOLANGIOGRAM;  Surgeon: Luretha Murphy, MD;  Location: [facility];  Service: General;  Laterality: N/A;  . IR GENERIC HISTORICAL  03/13/2016   IR PERC CHOLECYSTOSTOMY 03/13/2016 [name], MD [facility]  . IR GENERIC HISTORICAL  04/28/2016   IR RADIOLOGIST EVAL & MGMT 04/28/2016 [name], MD [facility]  . LOOP RECORDER IMPLANT N/A 11/29/2013   Procedure: LOOP RECORDER IMPLANT;  Surgeon: Marinus Maw, MD;  Location: [facility] CATH LAB;  Service: Cardiovascular;  Laterality: N/A;  . TEE WITHOUT CARDIOVERSION N/A 08/24/2013   Procedure: TRANSESOPHAGEAL ECHOCARDIOGRAM (TEE);  Surgeon: [name], MD;  Location: [facility] ENDOSCOPY;  Service: Cardiovascular;  Laterality: N/A;    Family History  Problem Relation Age of Onset  . Stomach cancer Mother   . High blood pressure Mother   . Diabetes Mother   . Diabetes Maternal Grandfather   . High blood pressure Maternal Grandfather     Social History   Socioeconomic History  . Marital status: Married    Spouse name: Not  on file  . Number of children: 3  . Years of education: MBA  . Highest education level: Not on file  Occupational History    Employer: [employer]    Comment: [place]  Tobacco Use  . Smoking status: Never Smoker  . Smokeless tobacco: Never Used  Substance and Sexual Activity  . Alcohol use: No    Alcohol/week: 1.0 standard drinks    Types: 1 Standard drinks or equivalent per week    Comment: Social  . Drug use: No  . Sexual activity: Not on file  Other Topics Concern  . Not on file  Social History Narrative   Patient lives at home with [name].   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         Social Determinants of Health   Financial Resource Strain:   . Difficulty of Paying Living Expenses:   Food Insecurity:   . Worried About Programme researcher, broadcasting/film/video in the Last Year:   . Barista in the Last Year:   Transportation Needs:   . Freight forwarder (Medical):   [name] Lack of Transportation (Non-Medical):   Physical Activity:   . Days of Exercise per Week:   . Minutes of Exercise per Session:   Stress:   . Feeling  of Stress :   Social Connections:   . Frequency of Communication with Friends and Family:   . Frequency of Social Gatherings with Friends and Family:   . Attends Religious Services:   . Active Member of Clubs or Organizations:   . Attends Archivist Meetings:   [name] Marital Status:   Intimate Partner Violence:   . Fear of Current or Ex-Partner:   . Emotionally Abused:   [name] Physically Abused:   . Sexually Abused:     Outpatient Medications Prior to Visit  Medication Sig Dispense Refill  . amLODipine (NORVASC) 5 MG tablet Take 1 tablet (5 mg total) by mouth daily. 90 tablet 3  . aspirin EC 81 MG tablet Take 81 mg by mouth at bedtime.    [name] atorvastatin (LIPITOR) 40 MG tablet Take 1 tablet (40 mg total) by mouth daily. 90 tablet 3  . Cholecalciferol  (VITAMIN D3) 3000 units TABS Take 3,000 Units by mouth at bedtime.    . Coenzyme Q10 (COQ10) 200 MG CAPS Take 200 mg by mouth at bedtime.    . Continuous Blood Gluc Sensor (FREESTYLE LIBRE 14 DAY SENSOR) MISC USE AS DIRECTED EVERY 14 DAYS 6 each 3  . dapagliflozin propanediol (FARXIGA) 10 MG TABS tablet Take 10 mg by mouth daily. 90 tablet 3  . glucose blood (ONETOUCH VERIO) test strip 1 each by Other route as needed for other. Use as instructed    . lisinopril (PRINIVIL,ZESTRIL) 40 MG tablet Take 1 tablet (40 mg total) by mouth at bedtime. 30 tablet 5  . metFORMIN (GLUCOPHAGE-XR) 500 MG 24 hr tablet Take 4 tablets (2,000 mg total) by mouth daily. 360 tablet 3  . Multiple Vitamin (MULTIVITAMIN WITH MINERALS) TABS tablet Take 1 tablet by mouth daily.    . Omega-3 Fatty Acids (OMEGA-3 FISH OIL) 1200 MG CAPS Take 1,200 mg by mouth at bedtime.    . Semaglutide (RYBELSUS) 14 MG TABS Take 14 mg by mouth every morning. 30 tablet 11  . Ginkgo Biloba 500 MG CAPS Take 500 mg by mouth at bedtime.      No facility-administered medications prior to visit.    Allergies  Allergen Reactions  . Penicillins Other (See Comments)    Driving couldn't see and head felt heavy  Has patient had a PCN reaction causing immediate rash, facial/tongue/throat swelling, SOB or lightheadedness with hypotension: No Has patient had a PCN reaction causing severe rash involving mucus membranes or skin necrosis: No Has patient had a PCN reaction that required hospitalization: No Has patient had a PCN reaction occurring within the last 10 years: No If all of the above answers are "NO", then may proceed with Cephalosporin use.   . Other Other (See Comments)    Allergic to beef and eggplant causes rash on skin    ROS Review of Systems  Constitutional: Negative.   Eyes: Positive for visual disturbance.  Respiratory: Negative.   Cardiovascular: Negative.   Gastrointestinal: Negative.   Psychiatric/Behavioral: Negative.        Objective:    Physical Exam  Constitutional: She is oriented to person, place, and time. She appears well-developed and well-nourished. No distress.  HENT:  Head: Normocephalic and atraumatic.  Right Ear: External ear normal.  Left Ear: External ear normal.  Mouth/Throat:    Eyes: Conjunctivae are normal. Right eye exhibits no discharge. Left eye exhibits no discharge. No scleral icterus.  Neck: No JVD present. No tracheal deviation present. No thyromegaly present.  Cardiovascular: Normal rate,  regular rhythm and normal heart sounds.  Pulmonary/Chest: Effort normal and breath sounds normal. No stridor.  Musculoskeletal:        General: No edema.  Lymphadenopathy:    She has no cervical adenopathy.  Neurological: She is alert and oriented to person, place, and time.  Skin: Skin is warm and dry. She is not diaphoretic.  Psychiatric: She has a normal mood and affect. Her behavior is normal.    BP 120/72   Pulse 95   Temp 98.4 F (36.9 C) (Tympanic)   Ht 5\' 1"  (1.549 m)   Wt 125 lb 6.4 oz (56.9 kg)   SpO2 97%   BMI 23.69 kg/m  Wt Readings from Last 3 Encounters:  06/23/19 125 lb 6.4 oz (56.9 kg)  05/29/19 128 lb 6.4 oz (58.2 kg)  05/01/19 123 lb (55.8 kg)     Health Maintenance Due  Topic Date Due  . Hepatitis C Screening  Never done  . HIV Screening  Never done  . TETANUS/TDAP  Never done  . COLONOSCOPY  Never done  . FOOT EXAM  05/10/2019  . DEXA SCAN  Never done  . PNA vac Low Risk Adult (1 of 2 - PCV13) 05/28/2019    There are no preventive care reminders to display for this patient.  Lab Results  Component Value Date   TSH 2.42 10/13/2018   Lab Results  Component Value Date   WBC 9.7 05/25/2016   HGB 12.5 05/25/2016   HCT 37.9 05/25/2016   MCV 83.7 05/25/2016   PLT 165 05/25/2016   Lab Results  Component Value Date   NA 141 10/13/2018   K 4.7 10/13/2018   CO2 28 10/13/2018   GLUCOSE 135 (H) 10/13/2018   BUN 13 10/13/2018   CREATININE  0.87 10/13/2018   BILITOT 0.4 10/13/2018   ALKPHOS 70 10/13/2018   AST 16 10/13/2018   ALT 25 10/13/2018   PROT 7.4 10/13/2018   ALBUMIN 4.4 10/13/2018   CALCIUM 9.5 10/13/2018   ANIONGAP 11 05/21/2016   GFR 65.47 10/13/2018   Lab Results  Component Value Date   CHOL 159 03/06/2019   Lab Results  Component Value Date   HDL 46.70 03/06/2019   Lab Results  Component Value Date   LDLCALC 77 03/06/2019   Lab Results  Component Value Date   TRIG 174.0 (H) 03/06/2019   Lab Results  Component Value Date   CHOLHDL 3 03/06/2019   Lab Results  Component Value Date   HGBA1C 7.0 (A) 05/29/2019      Assessment & Plan:   Problem List Items Addressed This Visit      Endocrine   Diabetes with retinopathy ([facility]) - Primary (Chronic)      No orders of the defined types were placed in this encounter.   Follow-up: No follow-ups on file.   Have sent a note to Dr. 05/31/2019. Low risk for Apnea but does have Mallampati of 4.  Luciana Axe, MD

## 2019-06-23 NOTE — Patient Instructions (Signed)
Sleep Apnea Sleep apnea is a condition in which breathing pauses or becomes shallow during sleep. Episodes of sleep apnea usually last 10 seconds or longer, and they may occur as many as 20 times an hour. Sleep apnea disrupts your sleep and keeps your body from getting the rest that it needs. This condition can increase your risk of certain health problems, including:  Heart attack.  Stroke.  Obesity.  Diabetes.  Heart failure.  Irregular heartbeat. What are the causes? There are three kinds of sleep apnea:  Obstructive sleep apnea. This kind is caused by a blocked or collapsed airway.  Central sleep apnea. This kind happens when the part of the brain that controls breathing does not send the correct signals to the muscles that control breathing.  Mixed sleep apnea. This is a combination of obstructive and central sleep apnea. The most common cause of this condition is a collapsed or blocked airway. An airway can collapse or become blocked if:  Your throat muscles are abnormally relaxed.  Your tongue and tonsils are larger than normal.  You are overweight.  Your airway is smaller than normal. What increases the risk? You are more likely to develop this condition if you:  Are overweight.  Smoke.  Have a smaller than normal airway.  Are elderly.  Are female.  Drink alcohol.  Take sedatives or tranquilizers.  Have a family history of sleep apnea. What are the signs or symptoms? Symptoms of this condition include:  Trouble staying asleep.  Daytime sleepiness and tiredness.  Irritability.  Loud snoring.  Morning headaches.  Trouble concentrating.  Forgetfulness.  Decreased interest in sex.  Unexplained sleepiness.  Mood swings.  Personality changes.  Feelings of depression.  Waking up often during the night to urinate.  Dry mouth.  Sore throat. How is this diagnosed? This condition may be diagnosed with:  A medical history.  A physical  exam.  A series of tests that are done while you are sleeping (sleep study). These tests are usually done in a sleep lab, but they may also be done at home. How is this treated? Treatment for this condition aims to restore normal breathing and to ease symptoms during sleep. It may involve managing health issues that can affect breathing, such as high blood pressure or obesity. Treatment may include:  Sleeping on your side.  Using a decongestant if you have nasal congestion.  Avoiding the use of depressants, including alcohol, sedatives, and narcotics.  Losing weight if you are overweight.  Making changes to your diet.  Quitting smoking.  Using a device to open your airway while you sleep, such as: ? An oral appliance. This is a custom-made mouthpiece that shifts your lower jaw forward. ? A continuous positive airway pressure (CPAP) device. This device blows air through a mask when you breathe out (exhale). ? A nasal expiratory positive airway pressure (EPAP) device. This device has valves that you put into each nostril. ? A bi-level positive airway pressure (BPAP) device. This device blows air through a mask when you breathe in (inhale) and breathe out (exhale).  Having surgery if other treatments do not work. During surgery, excess tissue is removed to create a wider airway. It is important to get treatment for sleep apnea. Without treatment, this condition can lead to:  High blood pressure.  Coronary artery disease.  In men, an inability to achieve or maintain an erection (impotence).  Reduced thinking abilities. Follow these instructions at home: Lifestyle  Make any lifestyle changes   that your health care provider recommends.  Eat a healthy, well-balanced diet.  Take steps to lose weight if you are overweight.  Avoid using depressants, including alcohol, sedatives, and narcotics.  Do not use any products that contain nicotine or tobacco, such as cigarettes,  e-cigarettes, and chewing tobacco. If you need help quitting, ask your health care provider. General instructions  Take over-the-counter and prescription medicines only as told by your health care provider.  If you were given a device to open your airway while you sleep, use it only as told by your health care provider.  If you are having surgery, make sure to tell your health care provider you have sleep apnea. You may need to bring your device with you.  Keep all follow-up visits as told by your health care provider. This is important. Contact a health care provider if:  The device that you received to open your airway during sleep is uncomfortable or does not seem to be working.  Your symptoms do not improve.  Your symptoms get worse. Get help right away if:  You develop: ? Chest pain. ? Shortness of breath. ? Discomfort in your back, arms, or stomach.  You have: ? Trouble speaking. ? Weakness on one side of your body. ? Drooping in your face. These symptoms may represent a serious problem that is an emergency. Do not wait to see if the symptoms will go away. Get medical help right away. Call your local emergency services (911 in the [place] Do not drive yourself to the hospital. Summary  Sleep apnea is a condition in which breathing pauses or becomes shallow during sleep.  The most common cause is a collapsed or blocked airway.  The goal of treatment is to restore normal breathing and to ease symptoms during sleep. This information is not intended to replace advice given to you by your health care provider. Make sure you discuss any questions you have with your health care provider. Document Revised: 09/07/2018 Document Reviewed: 11/16/2017 Elsevier Patient Education  2020 Elsevier Inc.  

## 2019-06-26 ENCOUNTER — Telehealth: Payer: Self-pay | Admitting: Family Medicine

## 2019-06-26 NOTE — Telephone Encounter (Signed)
Patient is calling and wanting to speak to someone regarding a note that was suppose to sent to Dr. Luciana Axe. CB is [phone]

## 2019-06-29 NOTE — Telephone Encounter (Signed)
Called patient for her input on possible steep study, I also informed patient that Dr. Doreene Burke was waiting to hear back from Dr. Luciana Axe.

## 2019-06-29 NOTE — Telephone Encounter (Signed)
I have written Dr. Luciana Axe twice now asking him about his thoughts for the sleep study. Waiting to hear. If patient is agreeable it may be easiest just to send her for the study.

## 2019-06-29 NOTE — Telephone Encounter (Signed)
Spoke with patient who states that she was sent to come in for possible sleep apnea by Dr. Luciana Axe. Per patient at her visit with Dr. Doreene Burke she was told that there was no sign of sleep apnea and Dr. Doreene Burke was going to send a note to Dr. Luciana Axe to inform him that Dr. Doreene Burke did not see any indication of sleep apnea. Patient states that Dr. Luciana Axe have not received the note. Please advise.

## 2019-06-30 NOTE — Telephone Encounter (Signed)
Spoke with patient who states that she does not feel like she needs a sleep study and she was satisfied with her visit with you, per patient you will not have to wait to hear from Dr. Luciana Axe because she is in the precess of finding a new eye doctor for a second opinion. Per patient she will hold on sleep study.

## 2019-07-10 ENCOUNTER — Ambulatory Visit (INDEPENDENT_AMBULATORY_CARE_PROVIDER_SITE_OTHER): Payer: Medicare Other | Admitting: Ophthalmology

## 2019-07-10 ENCOUNTER — Telehealth: Payer: Self-pay | Admitting: Family Medicine

## 2019-07-10 ENCOUNTER — Encounter (INDEPENDENT_AMBULATORY_CARE_PROVIDER_SITE_OTHER): Payer: Self-pay | Admitting: Ophthalmology

## 2019-07-10 ENCOUNTER — Other Ambulatory Visit: Payer: Self-pay

## 2019-07-10 DIAGNOSIS — E113411 Type 2 diabetes mellitus with severe nonproliferative diabetic retinopathy with macular edema, right eye: Secondary | ICD-10-CM

## 2019-07-10 MED ORDER — AFLIBERCEPT 2MG/0.05ML IZ SOLN FOR KALEIDOSCOPE
2.0000 mg | INTRAVITREAL | Status: AC | PRN
  Administered 2019-07-10: 2 mg via INTRAVITREAL

## 2019-07-10 NOTE — Telephone Encounter (Signed)
Patient is calling and wanted to speak to someone regarding a refill. CB is [phone]

## 2019-07-10 NOTE — Telephone Encounter (Signed)
Patient calling back would like for Dr. Doreene Burke to speak with Dr. Luciana Axe to decide if she needs a sleep study per patient she had an appointment with Dr. Luciana Axe today and he told her again that she does need a sleep study. Patient will wait until both doctors talk and decide on this matter.

## 2019-07-17 ENCOUNTER — Telehealth: Payer: Self-pay | Admitting: Endocrinology

## 2019-07-17 NOTE — Telephone Encounter (Signed)
Patient requests to be called at ph# [phone] re: Rybelsus. Patient states that she does not have [place] Plan D and her insurance will not cover Rybelsus. Cost of Rybelsus is $24.99 for 30 day supply. Patient would like a discount card for the above medication. Also, patient states she scanned her [facility] Reader and that Dr. Everardo All monitors her blood sugar readings.

## 2019-07-18 NOTE — Telephone Encounter (Signed)
Discount card mailed to pt home address.

## 2019-07-18 NOTE — Telephone Encounter (Signed)
Please review and advise about discount savings for Rybelsus or if you wish to change Rx to a more affordable option. In terms of CBG's, a reminder was sent to pt to upload her data for your review. Last upload was 05/17/19 through 05/30/19. Pt has not shared current data according to Libre glucose history.

## 2019-07-18 NOTE — Telephone Encounter (Signed)
The discount "card" we have is just a tear-off paper showing how to send a text, and the card is sent to pt's phone.  If she does not have an android or apple phone, there is a toll-free number.  Please give pt this info.

## 2019-07-27 ENCOUNTER — Other Ambulatory Visit: Payer: Self-pay

## 2019-07-31 ENCOUNTER — Other Ambulatory Visit: Payer: Self-pay

## 2019-07-31 ENCOUNTER — Encounter: Payer: Self-pay | Admitting: Endocrinology

## 2019-07-31 ENCOUNTER — Ambulatory Visit (INDEPENDENT_AMBULATORY_CARE_PROVIDER_SITE_OTHER): Payer: Medicare Other | Admitting: Endocrinology

## 2019-07-31 VITALS — BP 130/80 | HR 100 | Ht 61.0 in | Wt 126.8 lb

## 2019-07-31 DIAGNOSIS — E11319 Type 2 diabetes mellitus with unspecified diabetic retinopathy without macular edema: Secondary | ICD-10-CM | POA: Diagnosis not present

## 2019-07-31 LAB — POCT GLYCOSYLATED HEMOGLOBIN (HGB A1C): Hemoglobin A1C: 6.9 % — AB (ref 4.0–5.6)

## 2019-07-31 NOTE — Patient Instructions (Addendum)
Please continue the same diabetes medications.   check your blood sugar once a day.  vary the time of day when you check, between before the 3 meals, and at bedtime.  also check if you have symptoms of your blood sugar being too high or too low.  please keep a record of the readings and bring it to your next appointment here (or you can bring the meter itself).  You can write it on any piece of paper.  please call us sooner if your blood sugar goes below 70, or if you have a lot of readings over 200.  Please come back for a follow-up appointment in 3 months.    

## 2019-07-31 NOTE — Progress Notes (Signed)
Subjective:    Patient ID: Marie [name], female    DOB: [birth year], 64 y.o.   MRN: [id number]  HPI Pt returns for f/u of diabetes mellitus: DM type: 2  Dx'ed: 2010 Complications: PN, CVA, and DR.   Therapy: 3 oral meds.  GDM: never DKA: never Severe hypoglycemia: never Pancreatitis: never Pancreatic imaging (2017) CT: calcification of the pancreatic tail.  Other: she has freestyle libre continuous glucose monitor; she has never been on insulin.   Interval history: She takes 3 meds as rx'ed.  She says cbg's are well-controlled.  pt states she feels well in general.   I reviewed continuous glucose monitor data.  Glucose varies from 110-200.  There is not much trend throughout the day. Past Medical History:  Diagnosis Date  . CVA (cerebral vascular accident) (HCC) 2015  . Depression   . Diabetes (HCC)    t2  . Gallstones   . Hyperlipemia   . Hypertension   . Migraines     Past Surgical History:  Procedure Laterality Date  . CESAREAN SECTION     x2  . CHOLECYSTECTOMY N/A 05/25/2016   Procedure: LAPAROSCOPIC CHOLECYSTECTOMY WITH INTRAOPERATIVE CHOLANGIOGRAM;  Surgeon: Luretha Murphy, MD;  Location: [facility] ORS;  Service: General;  Laterality: N/A;  . IR GENERIC HISTORICAL  03/13/2016   IR PERC CHOLECYSTOSTOMY 03/13/2016 [name], MD [facility]INTER[facility] RAD  . IR GENERIC HISTORICAL  04/28/2016   IR RADIOLOGIST EVAL & MGMT 04/28/2016 [name], MD [facility] INTER[facility] RAD  . LOOP RECORDER IMPLANT N/A 11/29/2013   Procedure: LOOP RECORDER IMPLANT;  Surgeon: Marinus Maw, MD;  Location: [facility] CATH LAB;  Service: Cardiovascular;  Laterality: N/A;  . TEE WITHOUT CARDIOVERSION N/A 08/24/2013   Procedure: TRANSESOPHAGEAL ECHOCARDIOGRAM (TEE);  Surgeon: [name], MD;  Location: [facility] ENDOSCOPY;  Service: Cardiovascular;  Laterality: N/A;    Social History   Socioeconomic History  . Marital status: Married    Spouse name: Not on file  . Number of children: 3  . Years of education: MBA  . Highest  education level: Not on file  Occupational History    Employer: [employer]    Comment: [employer]  Tobacco Use  . Smoking status: Never Smoker  . Smokeless tobacco: Never Used  Substance and Sexual Activity  . Alcohol use: No    Alcohol/week: 1.0 standard drinks    Types: 1 Standard drinks or equivalent per week    Comment: Social  . Drug use: No  . Sexual activity: Not on file  Other Topics Concern  . Not on file  Social History Narrative   Patient lives at home with her husband (Marie [name]).   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         Social Determinants of Health   Financial Resource Strain:   . Difficulty of Paying Living Expenses:   Food Insecurity:   . Worried About Programme researcher, broadcasting/film/video in the Last Year:   . Barista in the Last Year:   Transportation Needs:   . Freight forwarder (Medical):   [name] Lack of Transportation (Non-Medical):   Physical Activity:   . Days of Exercise per Week:   . Minutes of Exercise per Session:   Stress:   . Feeling of Stress :   Social Connections:   . Frequency of Communication with Friends and Family:   . Frequency of  Social Gatherings with Friends and Family:   . Attends Religious Services:   . Active Member of Clubs or Organizations:   . Attends Banker Meetings:   [name] Marital Status:   Intimate Partner Violence:   . Fear of Current or Ex-Partner:   . Emotionally Abused:   [name] Physically Abused:   . Sexually Abused:     Current Outpatient Medications on File Prior to Visit  Medication Sig Dispense Refill  . amLODipine (NORVASC) 5 MG tablet Take 1 tablet (5 mg total) by mouth daily. 90 tablet 3  . aspirin EC 81 MG tablet Take 81 mg by mouth at bedtime.    [name] atorvastatin (LIPITOR) 40 MG tablet Take 1 tablet (40 mg total) by mouth daily. 90 tablet 3  . Cholecalciferol (VITAMIN D3) 3000 units TABS Take 3,000 Units by mouth at  bedtime.    . Coenzyme Q10 (COQ10) 200 MG CAPS Take 200 mg by mouth at bedtime.    . Continuous Blood Gluc Sensor (FREESTYLE LIBRE 14 DAY SENSOR) MISC USE AS DIRECTED EVERY 14 DAYS 6 each 3  . dapagliflozin propanediol (FARXIGA) 10 MG TABS tablet Take 10 mg by mouth daily. 90 tablet 3  . Ginkgo Biloba 500 MG CAPS Take 500 mg by mouth at bedtime.     [name] glucose blood (ONETOUCH VERIO) test strip 1 each by Other route as needed for other. Use as instructed    . lisinopril (PRINIVIL,ZESTRIL) 40 MG tablet Take 1 tablet (40 mg total) by mouth at bedtime. 30 tablet 5  . metFORMIN (GLUCOPHAGE-XR) 500 MG 24 hr tablet Take 4 tablets (2,000 mg total) by mouth daily. 360 tablet 3  . Multiple Vitamin (MULTIVITAMIN WITH MINERALS) TABS tablet Take 1 tablet by mouth daily.    . Omega-3 Fatty Acids (OMEGA-3 FISH OIL) 1200 MG CAPS Take 1,200 mg by mouth at bedtime.    . Semaglutide (RYBELSUS) 14 MG TABS Take 14 mg by mouth every morning. 30 tablet 11   No current facility-administered medications on file prior to visit.    Allergies  Allergen Reactions  . Penicillins Other (See Comments)    Driving couldn't see and head felt heavy  Has patient had a PCN reaction causing immediate rash, facial/tongue/throat swelling, SOB or lightheadedness with hypotension: No Has patient had a PCN reaction causing severe rash involving mucus membranes or skin necrosis: No Has patient had a PCN reaction that required hospitalization: No Has patient had a PCN reaction occurring within the last 10 years: No If all of the above answers are "NO", then may proceed with Cephalosporin use.   . Other Other (See Comments)    Allergic to beef and eggplant causes rash on skin    Family History  Problem Relation Age of Onset  . Stomach cancer Mother   . High blood pressure Mother   . Diabetes Mother   . Diabetes Maternal Grandfather   . High blood pressure Maternal Grandfather     BP 130/80 (BP Location: Left Arm, Patient  Position: Sitting, Cuff Size: Normal)   Pulse 100   Ht 5\' 1"  (1.549 m)   Wt 126 lb 12.8 oz (57.5 kg)   SpO2 98%   BMI 23.96 kg/m    Review of Systems She denies hypoglycemia    Objective:   Physical Exam VITAL SIGNS:  See vs page GENERAL: no distress Pulses: dorsalis pedis intact bilat.   MSK: no deformity of the feet CV: no leg edema Skin:  no ulcer  on the feet.  normal color and temp on the feet. Neuro: sensation is intact to touch on the feet  Lab Results  Component Value Date   HGBA1C 6.9 (A) 07/31/2019        Assessment & Plan:  Type 2 DM: well-controlled   Patient Instructions  Please continue the same diabetes medications.   check your blood sugar once a day.  vary the time of day when you check, between before the 3 meals, and at bedtime.  also check if you have symptoms of your blood sugar being too high or too low.  please keep a record of the readings and bring it to your next appointment here (or you can bring the meter itself).  You can write it on any piece of paper.  please call us sooner if your blood sugar goes below 70, or if you have a lot of readings over 200. Please come back for a follow-up appointment in 3 months.

## 2019-08-10 ENCOUNTER — Telehealth: Payer: Self-pay | Admitting: Family Medicine

## 2019-08-10 NOTE — Telephone Encounter (Signed)
error 

## 2019-08-14 ENCOUNTER — Telehealth: Payer: Self-pay | Admitting: Family Medicine

## 2019-08-14 ENCOUNTER — Encounter (INDEPENDENT_AMBULATORY_CARE_PROVIDER_SITE_OTHER): Payer: Self-pay | Admitting: Ophthalmology

## 2019-08-14 ENCOUNTER — Other Ambulatory Visit: Payer: Self-pay

## 2019-08-14 ENCOUNTER — Ambulatory Visit (INDEPENDENT_AMBULATORY_CARE_PROVIDER_SITE_OTHER): Payer: Medicare Other | Admitting: Ophthalmology

## 2019-08-14 DIAGNOSIS — E113492 Type 2 diabetes mellitus with severe nonproliferative diabetic retinopathy without macular edema, left eye: Secondary | ICD-10-CM | POA: Diagnosis not present

## 2019-08-14 DIAGNOSIS — E113411 Type 2 diabetes mellitus with severe nonproliferative diabetic retinopathy with macular edema, right eye: Secondary | ICD-10-CM

## 2019-08-14 MED ORDER — FARXIGA 10 MG PO TABS: 10.0000 mg | ORAL_TABLET | Freq: Every day | ORAL | 3 refills | Status: DC

## 2019-08-14 MED ORDER — AFLIBERCEPT 2MG/0.05ML IZ SOLN FOR KALEIDOSCOPE
2.0000 mg | INTRAVITREAL | Status: AC | PRN
  Administered 2019-08-14: 2 mg via INTRAVITREAL

## 2019-08-14 NOTE — Telephone Encounter (Signed)
°  LAST APPOINTMENT DATE: 08/10/2019   NEXT APPOINTMENT DATE:@Visit  date not found  MEDICATION: dapagliflozin propanediol (FARXIGA) 10 MG TABS tablet  PHARMACY:[id number] - [place], [place] - [address]  COMMENTS: Patient states she has about 2 days left of the medication   **Let patient know to contact pharmacy at the end of the day to make sure medication is ready. **  ** Please notify patient to allow 48-72 hours to process**  **Encourage patient to contact the pharmacy for refills or they can request refills through [facility]**  CLINICAL FILLS OUT ALL BELOW:   LAST REFILL:  QTY:  REFILL DATE:    OTHER COMMENTS:    Okay for refill?  Please advise

## 2019-08-14 NOTE — Telephone Encounter (Signed)
Refill sent in

## 2019-08-14 NOTE — Progress Notes (Signed)
08/14/2019     CHIEF COMPLAINT Patient presents for Retina Follow Up   HISTORY OF PRESENT ILLNESS: Marie [name] is a 64 y.o. female who presents to the clinic today for:   HPI    Retina Follow Up    Patient presents with  Diabetic Retinopathy.  In right eye.  This started 5 weeks ago.  Severity is mild.  Duration of 5 weeks.  Since onset it is gradually improving.          Comments    5 Week Diabetic F/U OD, poss Eylea OD  Pt sts VA seems to be improving OD. No ocular pain, flashes, or floaters reported OU. LBS: 145 average the past several days        Last edited by Ileana Roup, COA on 08/14/2019  1:24 PM. (History)      Referring physician: Mliss Sax, MD [address] [place],  [place] [address]  HISTORICAL INFORMATION:   Selected notes from the MEDICAL RECORD NUMBER    Lab Results  Component Value Date   HGBA1C 6.9 (A) 07/31/2019     CURRENT MEDICATIONS: No current outpatient medications on file. (Ophthalmic Drugs)   No current facility-administered medications for this visit. (Ophthalmic Drugs)   Current Outpatient Medications (Other)  Medication Sig  . amLODipine (NORVASC) 5 MG tablet Take 1 tablet (5 mg total) by mouth daily.  [name] aspirin EC 81 MG tablet Take 81 mg by mouth at bedtime.  [name] atorvastatin (LIPITOR) 40 MG tablet Take 1 tablet (40 mg total) by mouth daily.  . Cholecalciferol (VITAMIN D3) 3000 units TABS Take 3,000 Units by mouth at bedtime.  . Coenzyme Q10 (COQ10) 200 MG CAPS Take 200 mg by mouth at bedtime.  . Continuous Blood Gluc Sensor (FREESTYLE LIBRE 14 DAY SENSOR) MISC USE AS DIRECTED EVERY 14 DAYS  . dapagliflozin propanediol (FARXIGA) 10 MG TABS tablet Take 10 mg by mouth daily.  . Ginkgo Biloba 500 MG CAPS Take 500 mg by mouth at bedtime.   [name] glucose blood (ONETOUCH VERIO) test strip 1 each by Other route as needed for other. Use as instructed  . lisinopril (PRINIVIL,ZESTRIL) 40 MG tablet Take 1 tablet (40 mg  total) by mouth at bedtime.  . metFORMIN (GLUCOPHAGE-XR) 500 MG 24 hr tablet Take 4 tablets (2,000 mg total) by mouth daily.  . Multiple Vitamin (MULTIVITAMIN WITH MINERALS) TABS tablet Take 1 tablet by mouth daily.  . Omega-3 Fatty Acids (OMEGA-3 FISH OIL) 1200 MG CAPS Take 1,200 mg by mouth at bedtime.  . Semaglutide (RYBELSUS) 14 MG TABS Take 14 mg by mouth every morning.   No current facility-administered medications for this visit. (Other)      REVIEW OF SYSTEMS:    ALLERGIES Allergies  Allergen Reactions  . Penicillins Other (See Comments)    Driving couldn't see and head felt heavy  Has patient had a PCN reaction causing immediate rash, facial/tongue/throat swelling, SOB or lightheadedness with hypotension: No Has patient had a PCN reaction causing severe rash involving mucus membranes or skin necrosis: No Has patient had a PCN reaction that required hospitalization: No Has patient had a PCN reaction occurring within the last 10 years: No If all of the above answers are "NO", then may proceed with Cephalosporin use.   . Other Other (See Comments)    Allergic to beef and eggplant causes rash on skin    PAST MEDICAL HISTORY Past Medical History:  Diagnosis Date  . CVA (cerebral vascular accident) (  [place]) 2015  . Depression   . Diabetes ([place])    t2  . Gallstones   . Hyperlipemia   . Hypertension   . Migraines    Past Surgical History:  Procedure Laterality Date  . CESAREAN SECTION     x2  . CHOLECYSTECTOMY N/A 05/25/2016   Procedure: LAPAROSCOPIC CHOLECYSTECTOMY WITH INTRAOPERATIVE CHOLANGIOGRAM;  Surgeon: Johnathan Hausen, MD;  Location: [facility];  Service: General;  Laterality: N/A;  . IR GENERIC HISTORICAL  03/13/2016   IR PERC CHOLECYSTOSTOMY 03/13/2016 Sandi Mariscal, MD [facility]INTERV RAD  . IR GENERIC HISTORICAL  04/28/2016   IR RADIOLOGIST EVAL & MGMT 04/28/2016 Jacqulynn Cadet, MD [facility] INTER[facility] RAD  . LOOP RECORDER IMPLANT N/A 11/29/2013   Procedure: LOOP RECORDER  IMPLANT;  Surgeon: Evans Lance, MD;  Location: [facility] CATH LAB;  Service: Cardiovascular;  Laterality: N/A;  . TEE WITHOUT CARDIOVERSION N/A 08/24/2013   Procedure: TRANSESOPHAGEAL ECHOCARDIOGRAM (TEE);  Surgeon: Sanda Klein, MD;  Location: [facility];  Service: Cardiovascular;  Laterality: N/A;    FAMILY HISTORY Family History  Problem Relation Age of Onset  . Stomach cancer Mother   . High blood pressure Mother   . Diabetes Mother   . Diabetes Maternal Grandfather   . High blood pressure Maternal Grandfather     SOCIAL HISTORY Social History   Tobacco Use  . Smoking status: Never Smoker  . Smokeless tobacco: Never Used  Substance Use Topics  . Alcohol use: No    Alcohol/week: 1.0 standard drinks    Types: 1 Standard drinks or equivalent per week    Comment: Social  . Drug use: No         OPHTHALMIC EXAM:  Base Eye Exam    Visual Acuity (ETDRS)      Right Left   Dist [place] 20/60 +2 20/20 -1   Dist ph [place] 20/50 -1        Tonometry (Tonopen, 1:28 PM)      Right Left   Pressure 15 15       Pupils      Pupils Dark Light Shape React APD   Right PERRL 4 3 Round Brisk None   Left PERRL 4 3 Round Brisk None       Visual Fields (Counting fingers)      Left Right    Full Full       Extraocular Movement      Right Left    Full Full       Neuro/Psych    Oriented x3: Yes   Mood/Affect: Normal       Dilation    Right eye: 1.0% Mydriacyl, 2.5% Phenylephrine @ 1:28 PM        Slit Lamp and Fundus Exam    External Exam      Right Left   External Normal Normal       Slit Lamp Exam      Right Left   Lids/Lashes Normal Normal   Conjunctiva/Sclera White and quiet White and quiet   Cornea Clear Clear   Anterior Chamber Deep and quiet Deep and quiet   Iris Round and reactive Round and reactive   Lens Posterior chamber intraocular lens Clear   Vitreous Normal Normal       Fundus Exam      Right Left   Disc Normal    C/D Ratio 0.4    Macula Focal  laser scars, Macular thickening, Microaneurysms, Clinically significant macular edema, Mild clinically significant macular edema  Vessels Retinopathy,, severe    Periphery Normal           IMAGING AND PROCEDURES  Imaging and Procedures for 08/14/19  OCT, Retina - OU - Both Eyes       Right Eye Quality was good. Scan locations included subfoveal. Central Foveal Thickness: 316. Progression has improved. Findings include abnormal foveal contour.   Left Eye Quality was good. Scan locations included subfoveal. Central Foveal Thickness: 2075. Progression has been stable.   Notes OD, residual cystoid macular edema of prior advanced CSME on therapy much improved.  Repeat intravitreal Eylea OD today at 5-week interval and examination again in 6 weeks OD  OS, no active clinically significant macular edema       Intravitreal Injection, Pharmacologic Agent - OD - Right Eye       Time Out 08/14/2019. 2:05 PM. Confirmed correct patient, procedure, site, and patient consented.   Anesthesia Topical anesthesia was used. Anesthetic medications included Akten 3.5%.   Procedure Preparation included Tobramycin 0.3%, Ofloxacin , 10% betadine to eyelids. A 30 gauge needle was used.   Injection:  2 mg aflibercept Gretta Cool) SOLN   NDC: [id number], Lot: [id number]   Route: Intravitreal, Site: Right Eye, Waste: 0 mg  Post-op Post injection exam found visual acuity of at least counting fingers. The patient tolerated the procedure well. There were no complications. The patient received written and verbal post procedure care education. Post injection medications were not given.                 ASSESSMENT/PLAN:  No problem-specific Assessment & Plan notes found for this encounter.      ICD-10-CM   1. Severe nonproliferative diabetic retinopathy of right eye, with macular edema, associated with type 2 diabetes mellitus (HCC)  E11.3411 OCT, Retina - OU - Both Eyes    Intravitreal  Injection, Pharmacologic Agent - OD - Right Eye    aflibercept (EYLEA) SOLN 2 mg  2. Severe nonproliferative diabetic retinopathy of left eye without macular edema associated with type 2 diabetes mellitus (HCC)  P53.6144     1.  2.  3.  Ophthalmic Meds Ordered this visit:  Meds ordered this encounter  Medications  . aflibercept (EYLEA) SOLN 2 mg       Return in about 6 weeks (around 09/25/2019) for dilate, OD, EYLEA OCT.  There are no Patient Instructions on file for this visit.   Explained the diagnoses, plan, and follow up with the patient and they expressed understanding.  Patient expressed understanding of the importance of proper follow up care.   Alford Highland Jourdan Maldonado M.D. Diseases & Surgery of the Retina and Vitreous [facility] 08/14/19     Abbreviations: M myopia (nearsighted); A astigmatism; H hyperopia (farsighted); P presbyopia; Mrx spectacle prescription;  CTL contact lenses; OD right eye; OS left eye; OU both eyes  XT exotropia; ET esotropia; PEK punctate epithelial keratitis; PEE punctate epithelial erosions; DES dry eye syndrome; MGD meibomian gland dysfunction; ATs artificial tears; PFAT's preservative free artificial tears; NSC nuclear sclerotic cataract; PSC posterior subcapsular cataract; ERM epi-retinal membrane; PVD posterior vitreous detachment; RD retinal detachment; DM diabetes mellitus; DR diabetic retinopathy; NPDR non-proliferative diabetic retinopathy; PDR proliferative diabetic retinopathy; CSME clinically significant macular edema; DME diabetic macular edema; dbh dot blot hemorrhages; CWS cotton wool spot; POAG primary open angle glaucoma; C/D cup-to-disc ratio; HVF humphrey visual field; GVF goldmann visual field; OCT optical coherence tomography; IOP intraocular pressure; BRVO Branch retinal vein occlusion; CRVO central  retinal vein occlusion; CRAO central retinal artery occlusion; BRAO branch retinal artery occlusion; RT retinal tear; SB  scleral buckle; PPV pars plana vitrectomy; VH Vitreous hemorrhage; PRP panretinal laser photocoagulation; IVK intravitreal kenalog; VMT vitreomacular traction; MH Macular hole;  NVD neovascularization of the disc; NVE neovascularization elsewhere; AREDS age related eye disease study; ARMD age related macular degeneration; POAG primary open angle glaucoma; EBMD epithelial/anterior basement membrane dystrophy; ACIOL anterior chamber intraocular lens; IOL intraocular lens; PCIOL posterior chamber intraocular lens; Phaco/IOL phacoemulsification with intraocular lens placement; [place] photorefractive keratectomy; LASIK laser assisted in situ keratomileusis; HTN hypertension; DM diabetes mellitus; COPD chronic obstructive pulmonary disease

## 2019-08-16 ENCOUNTER — Telehealth: Payer: Self-pay

## 2019-08-16 NOTE — Telephone Encounter (Signed)
FAXED DOCUMENTS  Company: [employer]  Document: DWO for CGM Other records requested: Office notes and CGM download  All above requested information has been faxed successfully to [employer] listed above. Documents and fax confirmation have been placed in the faxed file for future reference.

## 2019-08-28 ENCOUNTER — Telehealth: Payer: Self-pay

## 2019-08-28 NOTE — Telephone Encounter (Signed)
FAXED DOCUMENTS  Company: [employer]  Document: Request for Feb 2021 office notes Other records requested: Last 2 CGM downloads  All above requested information has been faxed successfully to [employer] listed above. Documents and fax confirmation have been placed in the faxed file for future reference.

## 2019-08-31 ENCOUNTER — Telehealth: Payer: Self-pay

## 2019-08-31 ENCOUNTER — Other Ambulatory Visit: Payer: Self-pay

## 2019-08-31 NOTE — Telephone Encounter (Signed)
Called pt to advise of the following:  PA has already been completed with her insurance company by [employer]. Since she does not meet criteria, it would not matter where the Rx is submitted because insurance has already determined she does meet criteria for them to cover their portion of the Rx. Advised, since she does not meet criteria, only option she has is to purchase out of pocket. Pt declined stating, "this is too expensive".  No further action required. Freestyle discontinued from list d/t denied coverage and pt request.

## 2019-08-31 NOTE — Telephone Encounter (Signed)
Patient called stating her insurance told her where to send her Rx so that is covered by her insurance.  [employer] [facility] [phone]  Patient said we need to call [employer] to get her Rx approved.

## 2019-08-31 NOTE — Telephone Encounter (Signed)
Received request from [facility] requesting PA be completed for sensors. However, pt receives ALL CGM supplies through [place]. Therefore, PA is NOT required as her supplies are covered through a DME supplier, NOT a local pharmacy. Returned fax to [employer] PA will not be completed as ALL refills MUST got to [employer]. Confirmation received.

## 2019-08-31 NOTE — Telephone Encounter (Signed)
Pt called again and she stated that she spoke with her insurance company regarding the [employer]. Pt reports that her insurance company stated that the doctor has the final say, and if he writes a letter stating why the patient needs this device, they will cover it.  Multiple attempts were made to redirect the patient and explain that yes, her insurance does cover it as she has stated, but it is a conditional covered device. In order to qualify, the patient must meet certain criteria. Pt refused to accept this information.

## 2019-08-31 NOTE — Telephone Encounter (Signed)
Patient called to advise that she spoke with [facility] today and they advised her that she does not met the criteria for her to get the [employer].    Patient would like a return call at [phone] to advise on what cn be done now to get her the Free Style [place]

## 2019-09-01 ENCOUNTER — Telehealth: Payer: Self-pay | Admitting: Endocrinology

## 2019-09-01 ENCOUNTER — Telehealth: Payer: Self-pay | Admitting: Family Medicine

## 2019-09-01 NOTE — Telephone Encounter (Signed)
Please refer to message below. After reviewing, it seems this is a safety concern as pt appears to be doing self harm by not checking her CBG's prior to administration of insulin. In addition, this is reflective of non-compliance with your orders to check at least once per day. Uncertain if this warrants a letter of dismissal d/t her disregard of your medical recommendations and treatment plan.

## 2019-09-01 NOTE — Telephone Encounter (Signed)
Spoke with Dr. Everardo All at length about the conversations both Tiffany Kocher, LPN and myself have had with this pt re: the following:  Regardless of where a Rx is sent, a PA is still required and will be denied because she DOES NOT meet criteria Insurance company has provided her with false, misleading information resulting in this miscommunication and unrealistic expectation that it will be approved and she can get the Rx filled. Another PA will not be completed as it WILL result in a denial because she DOES NOT meet criteria  Dr. Everardo All has asked that I forward this message to the Practice Administrator for her to send pt a letter.

## 2019-09-01 NOTE — Telephone Encounter (Signed)
Patient is calling and requesting a call back regarding her diabetic sensor. CB is [phone]

## 2019-09-01 NOTE — Telephone Encounter (Signed)
Spoke with Patient and with her permission spoke with her daughter-in-law. Had a detailed conversation again regarding the [name] denial and in ability to get the device authorized. Daughter in law expressed understanding and reiterated understanding of why the pt could not get approved. Pt did still deny to ever prick her finger stating she "will not do it."  The Patients daughter in law and myself explained the concerns if she did not and the issue that if she doesn't she will never have the data to be able to get the freestyle libre again.  Pt still refused  Did explain to the patient and the daughter in law that if she is not checking her sugars as she should as a diabetic this is dangerous and there is no way for the provider to accurately treat the patient. Daughter in law understood. Patient was not receptive  Please advise on next steps

## 2019-09-01 NOTE — Telephone Encounter (Signed)
Patient called requesting to speak with Dr. George Hugh Nurse or the Office Manager. Call was transferred to [facility]

## 2019-09-01 NOTE — Telephone Encounter (Signed)
Please refer to message below. In addition, "refuses to stick herself anymore" is not a valid nor acceptable diagnosis for insurance to approve this device.

## 2019-09-01 NOTE — Telephone Encounter (Signed)
Patient called today (x2) asking again for RX to sensors to be resent to [employer], and/or another pharmacy.  Gave her the information as stated previously.  After relaying information patient wanted to speak with Dr Everardo All since things were not "being handled under the current processes".  I advised that we can schedule her and appointment to see Dr Everardo All to discuss or she can send him a message through My Chart.  She opted for in person appointment. I moved her 10/30/2019 appointment to 10/16/2019.    I asked if she wanted a meter, lancets, and test strips sent in since as patient stated she will not be able monitor her blood sugar now.  She declined stating that she "refuses to stick herself anymore".

## 2019-09-02 ENCOUNTER — Other Ambulatory Visit: Payer: Self-pay | Admitting: Family Medicine

## 2019-09-02 DIAGNOSIS — E11319 Type 2 diabetes mellitus with unspecified diabetic retinopathy without macular edema: Secondary | ICD-10-CM

## 2019-09-02 DIAGNOSIS — Z8673 Personal history of transient ischemic attack (TIA), and cerebral infarction without residual deficits: Secondary | ICD-10-CM

## 2019-09-05 ENCOUNTER — Telehealth: Payer: Self-pay | Admitting: Endocrinology

## 2019-09-05 NOTE — Telephone Encounter (Signed)
Patient called stating that the freestyle [place] company faxed over about 14 pages of readings from her meter and wondering if we had gotten it. [phone]

## 2019-09-05 NOTE — Telephone Encounter (Signed)
please contact patient: Please warn of d/c from practice if conduct continues.

## 2019-09-05 NOTE — Telephone Encounter (Signed)
All fax machines and in boxes have been cleared of all paperwork. Nothing received at the time of this entry

## 2019-09-06 ENCOUNTER — Other Ambulatory Visit: Payer: Self-pay

## 2019-09-06 DIAGNOSIS — E11319 Type 2 diabetes mellitus with unspecified diabetic retinopathy without macular edema: Secondary | ICD-10-CM

## 2019-09-06 MED ORDER — FREESTYLE LIBRE 14 DAY SENSOR MISC: 1.0000 | 0 refills | Status: DC

## 2019-09-06 NOTE — Telephone Encounter (Signed)
Day #2 - 10:32 AM call (@ 22 minutes in length)  Jeneen Rinks is calling today for a couple of things   #1 - she would like an RX for [employer] to be [facility] to [facility] [address]  (per patient she can afford a 1 time purchase from this location)  Ph [phone] spoke with pharmacost  #2 - she wants to confirm that we received paperwork from [employer] for the [employer] on 08/31/19 (I did advise of 7/10 business day window for paperwork) she just wants to confirm we got it   #3 - she wants to confirm that we received the upload from her Free Style [name] that was done 09/05/19

## 2019-09-06 NOTE — Telephone Encounter (Signed)
Practice administrator has bee notified regarding this patient and her repeated disregard for physician instructions.

## 2019-09-06 NOTE — Telephone Encounter (Signed)
UPDATE:  With regard to #2 listed below, documents have been received from [place]. Provided these documents to Dr. Everardo All to determine if he wanted to proceed with PA through [place] given the denial received from [facility]. Dr. Everardo All declined and advised that either Anette Riedel or [place] handle this concern. Informed Judeth Cornfield of Dr. George Hugh orders and routing this message to her as well to ensure pt is aware of Dr. George Hugh decision.  In terms of #3, nothing from [employer] has been received at the time of this entry.

## 2019-09-06 NOTE — Telephone Encounter (Signed)
The following has been addressed per pt request:  #1  Rx sent as self pay and can be seen below:  Outpatient Medication Detail   Disp Refills Start End   Continuous Blood Gluc Sensor (FREESTYLE LIBRE 14 DAY SENSOR) MISC 1 each 0 09/06/2019    Sig - Route: 1 each by Does not apply route every 14 (fourteen) days. WILL NOT COMPLETE PA. PATIENT WILL NOT BE USING INSURANCE. SELF PAY - Does not apply   Sent to pharmacy as: Continuous Blood Gluc Sensor (FREESTYLE LIBRE 14 DAY SENSOR) Misc   Notes to Pharmacy: WILL NOT COMPLETE PA. PATIENT WILL NOT BE USING INSURANCE. SELF PAY   E-Prescribing Status: Receipt confirmed by pharmacy (09/06/2019 11:05 AM EDT)    #2  This is a new request about paperwork from [place]. As documented 2 hours ago below AND including the time of this entry, there has NOT been any paperwork received for this patient from [place] placed in either in box NOR on the front or back fax machines.   #3  As documented 2 hours ago below AND including the time of this entry, there has NOT been any paperwork NOR uploads received for this patient from [employer] re: use of her Freestyle CGM placed in either in box NOR coming over the front or back fax machines.   Routing this message to Practice Admin and Team Lead as instructed by Dr. Everardo All.

## 2019-09-06 NOTE — Telephone Encounter (Signed)
DAY #2  Again removed all paperwork from both faxes and from both in boxes and nothing was received from [employer] as pt claims. Pt did call after hours and asked for a status update on her freestyle Rx. As previously documented, Freestyle has been denied so there is no updates to provide. Routing this message to both Team Lead and Practice Admin per Dr. George Hugh request. Per Dr. Everardo All, pt was to receive a warning call advising her that if this conduct continues, pt would be d/c'd from practice.

## 2019-09-06 NOTE — Telephone Encounter (Signed)
Patient is calling back regarding previous message left. CB is [phone]

## 2019-09-06 NOTE — Telephone Encounter (Signed)
Pt called again and she was given the information from MD below. She did verbalize understanding and she was also informed that she is completely ineligible for this. Pt did verbalize understanding of this.

## 2019-09-07 NOTE — Telephone Encounter (Signed)
Patient is returning the call. CB is [phone]

## 2019-09-07 NOTE — Telephone Encounter (Signed)
Letter has been created and sent

## 2019-09-07 NOTE — Telephone Encounter (Signed)
Called patient back, phone not connecting will try back later

## 2019-09-07 NOTE — Telephone Encounter (Signed)
Returned patients call, no answer LMTCB 

## 2019-09-12 ENCOUNTER — Other Ambulatory Visit: Payer: Self-pay | Admitting: Cardiology

## 2019-09-12 DIAGNOSIS — I10 Essential (primary) hypertension: Secondary | ICD-10-CM

## 2019-09-12 MED ORDER — AMLODIPINE BESYLATE 5 MG PO TABS: 5.0000 mg | ORAL_TABLET | Freq: Every day | ORAL | 3 refills | Status: DC

## 2019-09-12 MED ORDER — LISINOPRIL 40 MG PO TABS: 40.0000 mg | ORAL_TABLET | Freq: Every day | ORAL | 3 refills | Status: DC

## 2019-09-12 NOTE — Telephone Encounter (Signed)
°*  STAT* If patient is at the pharmacy, call can be transferred to refill team.   1. Which medications need to be refilled? (please list name of each medication and dose if known)  lisinopril (PRINIVIL,ZESTRIL) 40 MG tablet amLODipine (NORVASC) 5 MG tablet  2. Which pharmacy/location (including street and city if local pharmacy) is medication to be sent to? [id number] - [place], [place] - [address]  3. Do they need a 30 day or 90 day supply? 30 day

## 2019-09-19 NOTE — Telephone Encounter (Signed)
On 09/18/19, another requesting was received from [facility] requesting an urgent return for a [employer] non-insurance prescription.

## 2019-09-19 NOTE — Telephone Encounter (Signed)
Today, another request was received from [employer] for this patient in addition to the ones documented below.

## 2019-09-19 NOTE — Telephone Encounter (Signed)
Judeth Cornfield, please documentation below.

## 2019-09-19 NOTE — Telephone Encounter (Signed)
On 09/18/19, an urgent request was received by this office from [employer] stating that the patient is requesting [employer] CGM sensors. It does specify "cash pay", however, they are still requesting a certificate of medical necessity showing pt's diagnosis, most recent HbA1c and date, glucose checks per day, and signature.  On 09/19/2019, another request was received from [facility] requesting that a standard written order be completed and faxed back so patient can obtain the [facility] 14 day sensors and Reader.  As indicated below, patient was called and her behaviors was discussed per Dr. George Hugh request. Patient is fully aware that she does not qualify for a CGM. I am unsure if the patient is the one requesting that these be sent continuously or if the DME company are the ones sending on their own after patient contacted them once in the past prior to the conversation regarding possible dismissal if behavior continues. This message is being routed to Dr. Everardo All and his nurse, A.Eversole,LPN to make them aware and also to S.[place], Research officer, political party to handle how she sees fit according to the prior requests of Dr. Everardo All.

## 2019-09-20 ENCOUNTER — Other Ambulatory Visit: Payer: Self-pay

## 2019-09-21 ENCOUNTER — Ambulatory Visit: Payer: Medicare Other | Admitting: Family Medicine

## 2019-09-21 NOTE — Telephone Encounter (Signed)
Per previous correspondence with Dr. Everardo All, he would not be completing this paperwork for any of the below mentioned companies because she does not qualify for this device.

## 2019-09-21 NOTE — Telephone Encounter (Signed)
To All:  [employer] called to day to check status of their paper work CGM/  [facility] also called to day to check status of their paper work CGM .

## 2019-09-25 ENCOUNTER — Ambulatory Visit (INDEPENDENT_AMBULATORY_CARE_PROVIDER_SITE_OTHER): Payer: Medicare Other | Admitting: Ophthalmology

## 2019-09-25 ENCOUNTER — Other Ambulatory Visit: Payer: Self-pay

## 2019-09-25 ENCOUNTER — Encounter (INDEPENDENT_AMBULATORY_CARE_PROVIDER_SITE_OTHER): Payer: Self-pay | Admitting: Ophthalmology

## 2019-09-25 DIAGNOSIS — E113411 Type 2 diabetes mellitus with severe nonproliferative diabetic retinopathy with macular edema, right eye: Secondary | ICD-10-CM | POA: Diagnosis not present

## 2019-09-25 MED ORDER — AFLIBERCEPT 2MG/0.05ML IZ SOLN FOR KALEIDOSCOPE
2.0000 mg | INTRAVITREAL | Status: AC | PRN
  Administered 2019-09-25: 2 mg via INTRAVITREAL

## 2019-09-25 NOTE — Assessment & Plan Note (Signed)
Significant macular edema the right eye has been improving nicely on Eylea yet today at 6-week interval severe worsening of retinal thickening and macular macular edema.  Will reduce Pete intravitreal Eylea today and examination in 5 weeks.    Upon questioning patient does not have any episodic hypertension, does not have anemia, does not knowingly have sleep apnea all of which can trigger this type of worsening in the midst of therapy

## 2019-09-25 NOTE — Progress Notes (Signed)
09/25/2019     CHIEF COMPLAINT Patient presents for Retina Follow Up   HISTORY OF PRESENT ILLNESS: Marie [name] is a 64 y.o. female who presents to the clinic today for:   HPI    Retina Follow Up    Patient presents with  Diabetic Retinopathy.  In right eye.  This started 6 weeks ago.  Severity is mild.  Duration of 6 weeks.  Since onset it is gradually improving.          Comments    6 Week Diabetic F/U OD, poss Eylea OD  Pt sts [facility] OD is gradually improving, and is clearer. Pt denies new symptoms OU. Pt sts blood sugars have been fluctuating. LBS: 176 in office today       Last edited by Rockie Neighbours, [place] on 09/25/2019  1:46 PM. (History)      Referring physician: Libby Maw, MD [place],  [place] [id number]  HISTORICAL INFORMATION:   Selected notes from the MEDICAL RECORD NUMBER    Lab Results  Component Value Date   HGBA1C 6.9 (A) 07/31/2019     CURRENT MEDICATIONS: No current outpatient medications on file. (Ophthalmic Drugs)   No current facility-administered medications for this visit. (Ophthalmic Drugs)   Current Outpatient Medications (Other)  Medication Sig  . amLODipine (NORVASC) 5 MG tablet Take 1 tablet (5 mg total) by mouth daily.  [name] aspirin EC 81 MG tablet Take 81 mg by mouth at bedtime.  [name] atorvastatin (LIPITOR) 40 MG tablet Take 1 tablet (40 mg total) by mouth daily.  . Cholecalciferol (VITAMIN D3) 3000 units TABS Take 3,000 Units by mouth at bedtime.  . Coenzyme Q10 (COQ10) 200 MG CAPS Take 200 mg by mouth at bedtime.  . Continuous Blood Gluc Sensor (FREESTYLE LIBRE 14 DAY SENSOR) MISC 1 each by Does not apply route every 14 (fourteen) days. WILL NOT COMPLETE PA. PATIENT WILL NOT BE USING INSURANCE. SELF PAY  . dapagliflozin propanediol (FARXIGA) 10 MG TABS tablet Take 10 mg by mouth daily.  . Ginkgo Biloba 500 MG CAPS Take 500 mg by mouth at bedtime.   [name] glucose blood (ONETOUCH VERIO) test strip 1 each by Other  route as needed for other. Use as instructed  . lisinopril (ZESTRIL) 40 MG tablet Take 1 tablet (40 mg total) by mouth at bedtime.  . metFORMIN (GLUCOPHAGE-XR) 500 MG 24 hr tablet Take 4 tablets (2,000 mg total) by mouth daily.  . Multiple Vitamin (MULTIVITAMIN WITH MINERALS) TABS tablet Take 1 tablet by mouth daily.  . Omega-3 Fatty Acids (OMEGA-3 FISH OIL) 1200 MG CAPS Take 1,200 mg by mouth at bedtime.  . Semaglutide (RYBELSUS) 14 MG TABS Take 14 mg by mouth every morning.   No current facility-administered medications for this visit. (Other)      REVIEW OF SYSTEMS:    ALLERGIES Allergies  Allergen Reactions  . Penicillins Other (See Comments)    Driving couldn't see and head felt heavy  Has patient had a PCN reaction causing immediate rash, facial/tongue/throat swelling, SOB or lightheadedness with hypotension: No Has patient had a PCN reaction causing severe rash involving mucus membranes or skin necrosis: No Has patient had a PCN reaction that required hospitalization: No Has patient had a PCN reaction occurring within the last 10 years: No If all of the above answers are "NO", then may proceed with Cephalosporin use.   . Other Other (See Comments)    Allergic to beef and eggplant causes rash  on skin    PAST MEDICAL HISTORY Past Medical History:  Diagnosis Date  . CVA (cerebral vascular accident) (HCC) 2015  . Depression   . Diabetes (HCC)    t2  . Gallstones   . Hyperlipemia   . Hypertension   . Migraines    Past Surgical History:  Procedure Laterality Date  . CESAREAN SECTION     x2  . CHOLECYSTECTOMY N/A 05/25/2016   Procedure: LAPAROSCOPIC CHOLECYSTECTOMY WITH INTRAOPERATIVE CHOLANGIOGRAM;  Surgeon: Luretha Murphy, MD;  Location: [facility] ORS;  Service: General;  Laterality: N/A;  . IR GENERIC HISTORICAL  03/13/2016   IR PERC CHOLECYSTOSTOMY 03/13/2016 [name], MD [facility]INTER[facility] RAD  . IR GENERIC HISTORICAL  04/28/2016   IR RADIOLOGIST EVAL & MGMT 04/28/2016 [name], MD [facility]  . LOOP RECORDER IMPLANT N/A 11/29/2013   Procedure: LOOP RECORDER IMPLANT;  Surgeon: Marinus Maw, MD;  Location[facility] CATH LAB;  Service: Cardiovascular;  Laterality: N/A;  . TEE WITHOUT CARDIOVERSION N/A 08/24/2013   Procedure: TRANSESOPHAGEAL ECHOCARDIOGRAM (TEE);  Surgeon: [name], MD;  Location: [facility] ENDOSCOPY;  Service: Cardiovascular;  Laterality: N/A;    FAMILY HISTORY Family History  Problem Relation Age of Onset  . Stomach cancer Mother   . High blood pressure Mother   . Diabetes Mother   . Diabetes Maternal Grandfather   . High blood pressure Maternal Grandfather     SOCIAL HISTORY Social History   Tobacco Use  . Smoking status: Never Smoker  . Smokeless tobacco: Never Used  Vaping Use  . Vaping Use: Never used  Substance Use Topics  . Alcohol use: No    Alcohol/week: 1.0 standard drink    Types: 1 Standard drinks or equivalent per week    Comment: Social  . Drug use: No         OPHTHALMIC EXAM:  Base Eye Exam    Visual Acuity (ETDRS)      Right Left   Dist [place] 20/70ecc 20/25ecc -2   Dist ph [place] NI 20/20 -1       Tonometry (Tonopen, 1:52 PM)      Right Left   Pressure 15 15       Pupils      Pupils Dark Light Shape React APD   Right PERRL 4 3 Round Brisk None   Left PERRL 4 3 Round Brisk None       Visual Fields (Counting fingers)      Left Right    Full Full       Extraocular Movement      Right Left    Full Full       Neuro/Psych    Oriented x3: Yes   Mood/Affect: Normal       Dilation    Right eye: 1.0% Mydriacyl, 2.5% Phenylephrine @ 1:52 PM        Slit Lamp and Fundus Exam    External Exam      Right Left   External Normal Normal       Slit Lamp Exam      Right Left   Lids/Lashes Normal Normal   Conjunctiva/Sclera White and quiet White and quiet   Cornea Clear Clear   Anterior Chamber Deep and quiet Deep and quiet   Iris Round and reactive Round and reactive   Lens Posterior  chamber intraocular lens Clear   Anterior Vitreous Normal Normal       Fundus Exam      Right Left  Posterior Vitreous Posterior vitreous detachment    Disc Normal    C/D Ratio 0.5    Macula Focal laser scars, Macular thickening, Microaneurysms, Clinically significant macular edema, Severe clinically significant macular edema    Vessels Retinopathy,, severe    Periphery Normal           IMAGING AND PROCEDURES  Imaging and Procedures for 09/25/19  OCT, Retina - OU - Both Eyes       Right Eye Quality was good. Scan locations included subfoveal. Central Foveal Thickness: 619. Progression has worsened.   Left Eye Quality was good. Scan locations included subfoveal. Central Foveal Thickness: 280. Progression has been stable.   Notes Thickly significant macular edema the right eye much worse 6-week interval examination.  We will repeat intravitreal Eylea today and examination in 5 weeks OD       Intravitreal Injection, Pharmacologic Agent - OD - Right Eye       Time Out 09/25/2019. 2:46 PM. Confirmed correct patient, procedure, site, and patient consented.   Anesthesia Topical anesthesia was used. Anesthetic medications included [place] 3.5%.   Procedure Preparation included 10% betadine to eyelids, Tobramycin 0.3%. A 30 gauge needle was used.   Injection:  2 mg aflibercept Gretta Cool) SOLN   NDC: [id number], Lot: [id number]   Route: Intravitreal, Site: Right Eye, Waste: 0 mg  Post-op Post injection exam found visual acuity of at least counting fingers. The patient tolerated the procedure well. There were no complications. The patient received written and verbal post procedure care education. Post injection medications were not given.                 ASSESSMENT/PLAN:  Severe nonproliferative diabetic retinopathy of right eye, with macular edema, associated with type 2 diabetes mellitus (HCC) Significant macular edema the right eye has been improving nicely on  Eylea yet today at 6-week interval severe worsening of retinal thickening and macular macular edema.  Will reduce Pete intravitreal Eylea today and examination in 5 weeks.    Upon questioning patient does not have any episodic hypertension, does not have anemia, does not knowingly have sleep apnea all of which can trigger this type of worsening in the midst of therapy      ICD-10-CM   1. Severe nonproliferative diabetic retinopathy of right eye, with macular edema, associated with type 2 diabetes mellitus (HCC)  E11.3411 OCT, Retina - OU - Both Eyes    Intravitreal Injection, Pharmacologic Agent - OD - Right Eye    aflibercept (EYLEA) SOLN 2 mg    1.  2.  3.  Ophthalmic Meds Ordered this visit:  Meds ordered this encounter  Medications  . aflibercept (EYLEA) SOLN 2 mg       Return in about 5 weeks (around 10/30/2019) for DILATE OU, EYLEA OCT, OD.  There are no Patient Instructions on file for this visit.   Explained the diagnoses, plan, and follow up with the patient and they expressed understanding.  Patient expressed understanding of the importance of proper follow up care.   Alford Highland Quanetta Truss M.D. Diseases & Surgery of the Retina and Vitreous [facility] 09/25/19     Abbreviations: M myopia (nearsighted); A astigmatism; H hyperopia (farsighted); P presbyopia; Mrx spectacle prescription;  CTL contact lenses; OD right eye; OS left eye; OU both eyes  XT exotropia; ET esotropia; PEK punctate epithelial keratitis; PEE punctate epithelial erosions; DES dry eye syndrome; MGD meibomian gland dysfunction; ATs artificial tears; PFAT's preservative free artificial tears;  NSC nuclear sclerotic cataract; PSC posterior subcapsular cataract; ERM epi-retinal membrane; PVD posterior vitreous detachment; RD retinal detachment; DM diabetes mellitus; DR diabetic retinopathy; NPDR non-proliferative diabetic retinopathy; PDR proliferative diabetic retinopathy; CSME clinically  significant macular edema; DME diabetic macular edema; dbh dot blot hemorrhages; CWS cotton wool spot; POAG primary open angle glaucoma; C/D cup-to-disc ratio; HVF humphrey visual field; GVF goldmann visual field; OCT optical coherence tomography; IOP intraocular pressure; BRVO Branch retinal vein occlusion; CRVO central retinal vein occlusion; CRAO central retinal artery occlusion; BRAO branch retinal artery occlusion; RT retinal tear; SB scleral buckle; PPV pars plana vitrectomy; VH Vitreous hemorrhage; PRP panretinal laser photocoagulation; IVK intravitreal kenalog; VMT vitreomacular traction; MH Macular hole;  NVD neovascularization of the disc; NVE neovascularization elsewhere; AREDS age related eye disease study; ARMD age related macular degeneration; POAG primary open angle glaucoma; EBMD epithelial/anterior basement membrane dystrophy; ACIOL anterior chamber intraocular lens; IOL intraocular lens; PCIOL posterior chamber intraocular lens; Phaco/IOL phacoemulsification with intraocular lens placement; PRK photorefractive keratectomy; LASIK laser assisted in situ keratomileusis; HTN hypertension; DM diabetes mellitus; COPD chronic obstructive pulmonary disease

## 2019-09-25 NOTE — Telephone Encounter (Signed)
Please advise 

## 2019-09-25 NOTE — Telephone Encounter (Signed)
The letter has already been sent to this patient regarding this kind of repeated and insistent behavior. She has continued the same behavior. Do we move into dismissal?

## 2019-09-25 NOTE — Telephone Encounter (Signed)
Letter and form placed on Marie [name]'s desk for her to address and mail.

## 2019-09-25 NOTE — Telephone Encounter (Signed)
Letter and form done.

## 2019-09-26 ENCOUNTER — Ambulatory Visit (INDEPENDENT_AMBULATORY_CARE_PROVIDER_SITE_OTHER): Payer: Medicare Other | Admitting: Family Medicine

## 2019-09-26 ENCOUNTER — Encounter: Payer: Self-pay | Admitting: Family Medicine

## 2019-09-26 VITALS — BP 132/70 | HR 84 | Temp 97.7°F | Ht 61.0 in | Wt 126.2 lb

## 2019-09-26 DIAGNOSIS — N3281 Overactive bladder: Secondary | ICD-10-CM

## 2019-09-26 DIAGNOSIS — R0681 Apnea, not elsewhere classified: Secondary | ICD-10-CM

## 2019-09-26 DIAGNOSIS — N3289 Other specified disorders of bladder: Secondary | ICD-10-CM | POA: Insufficient documentation

## 2019-09-26 DIAGNOSIS — N39490 Overflow incontinence: Secondary | ICD-10-CM | POA: Diagnosis not present

## 2019-09-26 DIAGNOSIS — E11319 Type 2 diabetes mellitus with unspecified diabetic retinopathy without macular edema: Secondary | ICD-10-CM | POA: Diagnosis not present

## 2019-09-26 LAB — URINALYSIS, ROUTINE W REFLEX MICROSCOPIC
Bilirubin Urine: NEGATIVE
Hgb urine dipstick: NEGATIVE
Ketones, ur: NEGATIVE
Leukocytes,Ua: NEGATIVE
Nitrite: NEGATIVE
RBC / HPF: NONE SEEN (ref 0–?)
Specific Gravity, Urine: 1.015 (ref 1.000–1.030)
Total Protein, Urine: NEGATIVE
Urine Glucose: >=1000 — AB
Urobilinogen, UA: 0.2 (ref 0.0–1.0)
pH: 5 (ref 5.0–8.0)

## 2019-09-26 NOTE — Progress Notes (Addendum)
Established Patient Office Visit  Subjective:  Patient ID: Marie [name], female    DOB: 07/18/1955  Age: 64 y.o. MRN: [id number]  CC:  Chief Complaint  Patient presents with  . Follow-up    discuss referral to cardiology and endocrinologist issues with urine incontinence.   . Urinary Incontinence    HPI Marie [name] presents for 4 to 7-month history of urgency of urination.  There is sometimes incontinence before she can make it to the bathroom.  She is able to sit through 2-hour movie unless she has had a large dinner.  She denies dysuria, fever chills, nausea vomiting, abdominal or back pain or any unusual vaginal discharge.  Denies incontinence with cough or sneeze.  She is not sexually active.  She has been taking [place] for couple months now.  Status post female check a few months ago.  Exam was normal.  Ophthalmology is concerned that patient's retinopathy has been advancing quickly.  He is concerned that apnea may be playing a part in it.  She does not snore as far she knows but she does live alone essentially.  Past Medical History:  Diagnosis Date  . CVA (cerebral vascular accident) (HCC) 2015  . Depression   . Diabetes (HCC)    t2  . Gallstones   . Hyperlipemia   . Hypertension   . Migraines     Past Surgical History:  Procedure Laterality Date  . CESAREAN SECTION     x2  . CHOLECYSTECTOMY N/A 05/25/2016   Procedure: LAPAROSCOPIC CHOLECYSTECTOMY WITH INTRAOPERATIVE CHOLANGIOGRAM;  Surgeon: Luretha Murphy, MD;  Location: [facility];  Service: General;  Laterality: N/A;  . IR GENERIC HISTORICAL  03/13/2016   IR PERC CHOLECYSTOSTOMY 03/13/2016 [name], MD [facility]INTER[facility]  . IR GENERIC HISTORICAL  04/28/2016   IR RADIOLOGIST EVAL & MGMT 04/28/2016 [name], MD [facility]  . LOOP RECORDER IMPLANT N/A 11/29/2013   Procedure: LOOP RECORDER IMPLANT;  Surgeon: Marinus Maw, MD;  Location: [facility] CATH LAB;  Service: Cardiovascular;  Laterality: N/A;  . TEE WITHOUT  CARDIOVERSION N/A 08/24/2013   Procedure: TRANSESOPHAGEAL ECHOCARDIOGRAM (TEE);  Surgeon: [name], MD;  Location: [facility] ENDOSCOPY;  Service: Cardiovascular;  Laterality: N/A;    Family History  Problem Relation Age of Onset  . Stomach cancer Mother   . High blood pressure Mother   . Diabetes Mother   . Diabetes Maternal Grandfather   . High blood pressure Maternal Grandfather     Social History   Socioeconomic History  . Marital status: Married    Spouse name: Not on file  . Number of children: 3  . Years of education: MBA  . Highest education level: Not on file  Occupational History    Employer: [employer]    Comment: [employer]  Tobacco Use  . Smoking status: Never Smoker  . Smokeless tobacco: Never Used  Vaping Use  . Vaping Use: Never used  Substance and Sexual Activity  . Alcohol use: No    Alcohol/week: 1.0 standard drink    Types: 1 Standard drinks or equivalent per week    Comment: Social  . Drug use: No  . Sexual activity: Not on file  Other Topics Concern  . Not on file  Social History Narrative   Patient lives at home with [name].   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  Social Determinants of Health   Financial Resource Strain:   . Difficulty of Paying Living Expenses:   Food Insecurity:   . Worried About Programme researcher, broadcasting/film/video in the Last Year:   . Barista in the Last Year:   Transportation Needs:   . Freight forwarder (Medical):   [name] Lack of Transportation (Non-Medical):   Physical Activity:   . Days of Exercise per Week:   . Minutes of Exercise per Session:   Stress:   . Feeling of Stress :   Social Connections:   . Frequency of Communication with Friends and Family:   . Frequency of Social Gatherings with Friends and Family:   . Attends Religious Services:   . Active Member of Clubs or Organizations:   . Attends Tax inspector Meetings:   [name] Marital Status:   Intimate Partner Violence:   . Fear of Current or Ex-Partner:   . Emotionally Abused:   [name] Physically Abused:   . Sexually Abused:     Outpatient Medications Prior to Visit  Medication Sig Dispense Refill  . amLODipine (NORVASC) 5 MG tablet Take 1 tablet (5 mg total) by mouth daily. 90 tablet 3  . aspirin EC 81 MG tablet Take 81 mg by mouth at bedtime.    [name] atorvastatin (LIPITOR) 40 MG tablet Take 1 tablet (40 mg total) by mouth daily. 90 tablet 3  . Cholecalciferol (VITAMIN D3) 3000 units TABS Take 3,000 Units by mouth at bedtime.    . Coenzyme Q10 (COQ10) 200 MG CAPS Take 200 mg by mouth at bedtime.    . Continuous Blood Gluc Sensor (FREESTYLE LIBRE 14 DAY SENSOR) MISC 1 each by Does not apply route every 14 (fourteen) days. WILL NOT COMPLETE PA. PATIENT WILL NOT BE USING INSURANCE. SELF PAY 1 each 0  . dapagliflozin propanediol (FARXIGA) 10 MG TABS tablet Take 10 mg by mouth daily. 90 tablet 3  . Ginkgo Biloba 500 MG CAPS Take 500 mg by mouth at bedtime.     [name] glucose blood (ONETOUCH VERIO) test strip 1 each by Other route as needed for other. Use as instructed    . lisinopril (ZESTRIL) 40 MG tablet Take 1 tablet (40 mg total) by mouth at bedtime. 90 tablet 3  . metFORMIN (GLUCOPHAGE-XR) 500 MG 24 hr tablet Take 4 tablets (2,000 mg total) by mouth daily. 360 tablet 3  . Multiple Vitamin (MULTIVITAMIN WITH MINERALS) TABS tablet Take 1 tablet by mouth daily.    . Semaglutide (RYBELSUS) 14 MG TABS Take 14 mg by mouth every morning. 30 tablet 11  . Omega-3 Fatty Acids (OMEGA-3 FISH OIL) 1200 MG CAPS Take 1,200 mg by mouth at bedtime. (Patient not taking: Reported on 09/26/2019)     No facility-administered medications prior to visit.    Allergies  Allergen Reactions  . Penicillins Other (See Comments)    Driving couldn't see and head felt heavy  Has patient had a PCN reaction causing immediate rash, facial/tongue/throat swelling, SOB or  lightheadedness with hypotension: No Has patient had a PCN reaction causing severe rash involving mucus membranes or skin necrosis: No Has patient had a PCN reaction that required hospitalization: No Has patient had a PCN reaction occurring within the last 10 years: No If all of the above answers are "NO", then may proceed with Cephalosporin use.   . Other Other (See Comments)    Allergic to beef and eggplant causes rash on skin    ROS Review  of Systems  Constitutional: Negative.   HENT: Negative.   Eyes: Positive for visual disturbance.  Respiratory: Negative.   Cardiovascular: Negative.   Gastrointestinal: Negative.   Genitourinary: Positive for frequency and urgency. Negative for decreased urine volume, dysuria, flank pain, pelvic pain, vaginal bleeding, vaginal discharge and vaginal pain.  Musculoskeletal: Negative.   Skin: Negative for pallor and rash.  Allergic/Immunologic: Negative for immunocompromised state.  Neurological: Negative for light-headedness and headaches.  Hematological: Does not bruise/bleed easily.  Psychiatric/Behavioral: Negative.       Objective:    Physical Exam Constitutional:      General: She is not in acute distress.    Appearance: Normal appearance. She is normal weight. She is not ill-appearing, toxic-appearing or diaphoretic.  HENT:     Head: Normocephalic and atraumatic.     Right Ear: External ear normal.     Left Ear: External ear normal.     Mouth/Throat:   Eyes:     General:        Right eye: No discharge.        Left eye: No discharge.     Conjunctiva/sclera: Conjunctivae normal.  Cardiovascular:     Rate and Rhythm: Normal rate and regular rhythm.  Pulmonary:     Effort: Pulmonary effort is normal.     Breath sounds: Normal breath sounds.  Abdominal:     General: Abdomen is flat. Bowel sounds are normal. There is no distension.     Palpations: Abdomen is soft. There is no mass.     Tenderness: There is no abdominal  tenderness. There is no right CVA tenderness, left CVA tenderness, guarding or rebound.     Hernia: No hernia is present.  Musculoskeletal:     Right lower leg: No edema.     Left lower leg: No edema.  Skin:    General: Skin is warm and dry.  Neurological:     Mental Status: She is alert and oriented to person, place, and time.  Psychiatric:        Mood and Affect: Mood normal.        Behavior: Behavior normal.     BP 132/70   Pulse 84   Temp 97.7 F (36.5 C) (Tympanic)   Ht 5\' 1"  (1.549 m)   Wt 126 lb 3.2 oz (57.2 kg)   SpO2 97%   BMI 23.85 kg/m  Wt Readings from Last 3 Encounters:  09/26/19 126 lb 3.2 oz (57.2 kg)  07/31/19 126 lb 12.8 oz (57.5 kg)  06/23/19 125 lb 6.4 oz (56.9 kg)     Health Maintenance Due  Topic Date Due  . Hepatitis C Screening  Never done  . HIV Screening  Never done  . TETANUS/TDAP  Never done  . COLONOSCOPY  Never done  . FOOT EXAM  05/10/2019  . DEXA SCAN  Never done  . PNA vac Low Risk Adult (1 of 2 - PCV13) Never done    There are no preventive care reminders to display for this patient.  Lab Results  Component Value Date   TSH 2.42 10/13/2018   Lab Results  Component Value Date   WBC 9.7 05/25/2016   HGB 12.5 05/25/2016   HCT 37.9 05/25/2016   MCV 83.7 05/25/2016   PLT 165 05/25/2016   Lab Results  Component Value Date   NA 141 10/13/2018   K 4.7 10/13/2018   CO2 28 10/13/2018   GLUCOSE 135 (H) 10/13/2018   BUN 13 10/13/2018  CREATININE 0.87 10/13/2018   BILITOT 0.4 10/13/2018   ALKPHOS 70 10/13/2018   AST 16 10/13/2018   ALT 25 10/13/2018   PROT 7.4 10/13/2018   ALBUMIN 4.4 10/13/2018   CALCIUM 9.5 10/13/2018   ANIONGAP 11 05/21/2016   GFR 65.47 10/13/2018   Lab Results  Component Value Date   CHOL 159 03/06/2019   Lab Results  Component Value Date   HDL 46.70 03/06/2019   Lab Results  Component Value Date   LDLCALC 77 03/06/2019   Lab Results  Component Value Date   TRIG 174.0 (H) 03/06/2019    Lab Results  Component Value Date   CHOLHDL 3 03/06/2019   Lab Results  Component Value Date   HGBA1C 6.9 (A) 07/31/2019      Assessment & Plan:   Problem List Items Addressed This Visit      Endocrine   Diabetes with retinopathy (HCC) - Primary (Chronic)   Relevant Orders   Ambulatory referral to Endocrinology     Other   Overflow incontinence   Relevant Orders   Urinalysis, Routine w reflex microscopic (Completed)   Urine Culture (Completed)    Other Visit Diagnoses    Overactive bladder       Relevant Orders   Urinalysis, Routine w reflex microscopic (Completed)   Urine Culture (Completed)   Apnea       Relevant Orders   Ambulatory referral to Sleep Studies      No orders of the defined types were placed in this encounter.   Follow-up: Return in about 1 month (around 10/26/2019).  Given information on urinary incontinence.  May try medicine pending results of urine studies.  Mliss Sax, MD

## 2019-09-26 NOTE — Patient Instructions (Signed)

## 2019-09-27 LAB — URINE CULTURE
MICRO NUMBER:: 10619809
SPECIMEN QUALITY:: ADEQUATE

## 2019-09-28 ENCOUNTER — Telehealth: Payer: Self-pay | Admitting: Endocrinology

## 2019-09-28 ENCOUNTER — Telehealth: Payer: Self-pay | Admitting: Family Medicine

## 2019-09-28 NOTE — Telephone Encounter (Signed)
Patient would like referral to another Endocrinologist (other than Dr. Everardo All).

## 2019-09-28 NOTE — Telephone Encounter (Signed)
Patient dismissed from [facility] by Romero Belling, MD, effective 09/25/19. Dismissal Letter sent out by 1st class mail. [employer]

## 2019-09-29 NOTE — Telephone Encounter (Signed)
Patient called today to check status of new referral for Endocrinology that she requested yesterday afternoon. She also stated she is not feeling well from her diabetes. I transferred her to the triage nurse line.

## 2019-09-29 NOTE — Telephone Encounter (Signed)
Patient called back to let the office know that she would like to be referred to Dr. Reather Littler (Endo) or Talmage Coin (Endo). She states that she thinks they would be a good fit for her (see previous message).

## 2019-09-29 NOTE — Telephone Encounter (Signed)
Dr. Doreene Burke please advise message below. Okay to refer patient?

## 2019-09-29 NOTE — Addendum Note (Signed)
Addended by: Andrez Grime on: 09/29/2019 03:07 PM   Modules accepted: Orders

## 2019-10-02 ENCOUNTER — Other Ambulatory Visit: Payer: Self-pay | Admitting: Endocrinology

## 2019-10-02 DIAGNOSIS — E11319 Type 2 diabetes mellitus with unspecified diabetic retinopathy without macular edema: Secondary | ICD-10-CM

## 2019-10-02 NOTE — Telephone Encounter (Signed)
FINAL REFILL. DISMISSED FROM [facility]

## 2019-10-10 ENCOUNTER — Telehealth: Payer: Self-pay

## 2019-10-10 NOTE — Telephone Encounter (Signed)
error 

## 2019-10-13 ENCOUNTER — Telehealth: Payer: Self-pay | Admitting: Endocrinology

## 2019-10-13 NOTE — Telephone Encounter (Signed)
Attn Admin Pool: FYI Marie [name] Pt's appt has been removed due to being dismissed. If this patient arrives in the office Monday please teams stephanie and have pt wait for me so I can talk with her.

## 2019-10-13 NOTE — Telephone Encounter (Signed)
FYI

## 2019-10-13 NOTE — Telephone Encounter (Signed)
Two messages have now been left with the pt regarding her appt on Monday to discuss the need for this appt and address accordingly as she has been dismissed due to behavior revolving around freestyle libre and noncompliance with MD orders

## 2019-10-16 ENCOUNTER — Other Ambulatory Visit: Payer: Self-pay

## 2019-10-16 ENCOUNTER — Ambulatory Visit: Payer: Medicare Other | Admitting: Endocrinology

## 2019-10-16 NOTE — Telephone Encounter (Signed)
Error

## 2019-10-22 ENCOUNTER — Other Ambulatory Visit: Payer: Self-pay | Admitting: Endocrinology

## 2019-10-22 DIAGNOSIS — E11319 Type 2 diabetes mellitus with unspecified diabetic retinopathy without macular edema: Secondary | ICD-10-CM

## 2019-10-25 ENCOUNTER — Encounter (INDEPENDENT_AMBULATORY_CARE_PROVIDER_SITE_OTHER): Payer: Medicare Other | Admitting: Ophthalmology

## 2019-10-25 ENCOUNTER — Other Ambulatory Visit: Payer: Self-pay

## 2019-10-26 ENCOUNTER — Encounter: Payer: Self-pay | Admitting: Family Medicine

## 2019-10-26 ENCOUNTER — Ambulatory Visit (INDEPENDENT_AMBULATORY_CARE_PROVIDER_SITE_OTHER): Payer: Medicare Other | Admitting: Family Medicine

## 2019-10-26 VITALS — BP 150/80 | HR 89 | Temp 99.0°F | Ht 61.0 in | Wt 124.0 lb

## 2019-10-26 DIAGNOSIS — E13319 Other specified diabetes mellitus with unspecified diabetic retinopathy without macular edema: Secondary | ICD-10-CM | POA: Diagnosis not present

## 2019-10-26 DIAGNOSIS — I10 Essential (primary) hypertension: Secondary | ICD-10-CM

## 2019-10-26 DIAGNOSIS — E11319 Type 2 diabetes mellitus with unspecified diabetic retinopathy without macular edema: Secondary | ICD-10-CM | POA: Diagnosis not present

## 2019-10-26 DIAGNOSIS — E785 Hyperlipidemia, unspecified: Secondary | ICD-10-CM | POA: Diagnosis not present

## 2019-10-26 DIAGNOSIS — E1142 Type 2 diabetes mellitus with diabetic polyneuropathy: Secondary | ICD-10-CM

## 2019-10-26 DIAGNOSIS — R531 Weakness: Secondary | ICD-10-CM

## 2019-10-26 DIAGNOSIS — Z0000 Encounter for general adult medical examination without abnormal findings: Secondary | ICD-10-CM

## 2019-10-26 MED ORDER — METFORMIN HCL ER 500 MG PO TB24: 1000.0000 mg | ORAL_TABLET | Freq: Two times a day (BID) | ORAL | 3 refills | Status: DC

## 2019-10-26 MED ORDER — LISINOPRIL 40 MG PO TABS: 40.0000 mg | ORAL_TABLET | Freq: Every day | ORAL | 3 refills | Status: DC

## 2019-10-26 MED ORDER — ATORVASTATIN CALCIUM 40 MG PO TABS: 40.0000 mg | ORAL_TABLET | Freq: Every day | ORAL | 3 refills | Status: DC

## 2019-10-26 NOTE — Progress Notes (Signed)
Established Patient Office Visit  Subjective:  Patient ID: Marie [name] Burich, female    DOB: 12-29-1955  Age: 64 y.o. MRN: [id number]  CC: No chief complaint on file.   HPI Marie [name] Colligan presents for follow-up of her hypertension, elevated lipids and diabetes.  She has been out of her atorvastatin, lisinopril and Metformin.  Misunderstanding with her current endocrinologist and she has been dismissed from the practice.  Retinopathy is severe and progressive.  With OD she can only see the outline of my face.  Vision seems to be normal in OS.  I believe that she has had difficulty affording her medicines.  Past Medical History:  Diagnosis Date  . CVA (cerebral vascular accident) (HCC) 2015  . Depression   . Diabetes (HCC)    t2  . Gallstones   . Hyperlipemia   . Hypertension   . Migraines     Past Surgical History:  Procedure Laterality Date  . CESAREAN SECTION     x2  . CHOLECYSTECTOMY N/A 05/25/2016   Procedure: LAPAROSCOPIC CHOLECYSTECTOMY WITH INTRAOPERATIVE CHOLANGIOGRAM;  Surgeon: Luretha MurphyMatthew Martin, MD;  Location: [facility];  Service: General;  Laterality: N/A;  . IR GENERIC HISTORICAL  03/13/2016   IR PERC CHOLECYSTOSTOMY 03/13/2016 Simonne ComeJohn Watts, MD [facility]  . IR GENERIC HISTORICAL  04/28/2016   IR RADIOLOGIST EVAL & MGMT 04/28/2016 Malachy MoanHeath McCullough, MD [facility]  . LOOP RECORDER IMPLANT N/A 11/29/2013   Procedure: LOOP RECORDER IMPLANT;  Surgeon: Marinus MawGregg W Taylor, MD;  [facility];  Service: Cardiovascular;  Laterality: N/A;  . TEE WITHOUT CARDIOVERSION N/A 08/24/2013   Procedure: TRANSESOPHAGEAL ECHOCARDIOGRAM (TEE);  Surgeon: Thurmon FairMihai Croitoru, MD;  [facility];  Service: Cardiovascular;  Laterality: N/A;    Family History  Problem Relation Age of Onset  . Stomach cancer Mother   . High blood pressure Mother   . Diabetes Mother   . Diabetes Maternal Grandfather   . High blood pressure Maternal Grandfather     Social History   Socioeconomic History    . Marital status: Married    Spouse name: Not on file  . Number of children: 3  . Years of education: MBA  . Highest education level: Not on file  Occupational History    Employer: [employer]    Comment: [employer]  Tobacco Use  . Smoking status: Never Smoker  . Smokeless tobacco: Never Used  Vaping Use  . Vaping Use: Never used  Substance and Sexual Activity  . Alcohol use: No    Alcohol/week: 1.0 standard drink    Types: 1 Standard drinks or equivalent per week    Comment: Social  . Drug use: No  . Sexual activity: Not on file  Other Topics Concern  . Not on file  Social History Narrative   Patient lives at home with [name].   Patient works full time [employer]     College education MBA   Right handed   Caffeine two cups daily.         Social Determinants of Health   Financial Resource Strain:   . Difficulty of Paying Living Expenses:   Food Insecurity:   . Worried About Programme researcher, broadcasting/film/videounning Out of Food in the Last Year:   . Baristaan Out of Food in the Last Year:   Transportation Needs:   . Freight forwarderLack of Transportation (Medical):   [name]. Lack of Transportation (Non-Medical):   Physical Activity:   . Days of Exercise per Week:   .  Minutes of Exercise per Session:   Stress:   . Feeling of Stress :   Social Connections:   . Frequency of Communication with Friends and Family:   . Frequency of Social Gatherings with Friends and Family:   . Attends Religious Services:   . Active Member of Clubs or Organizations:   . Attends Banker Meetings:   [name] Marital Status:   Intimate Partner Violence:   . Fear of Current or Ex-Partner:   . Emotionally Abused:   [name] Physically Abused:   . Sexually Abused:     Outpatient Medications Prior to Visit  Medication Sig Dispense Refill  . amLODipine (NORVASC) 5 MG tablet Take 1 tablet (5 mg total) by mouth daily. 90 tablet 3  . aspirin EC 81 MG tablet Take 81 mg by mouth at bedtime.    . Cholecalciferol (VITAMIN D3)  3000 units TABS Take 3,000 Units by mouth at bedtime.    [name] glucose blood (ONETOUCH VERIO) test strip 1 each by Other route as needed for other. Use as instructed    . Multiple Vitamin (MULTIVITAMIN WITH MINERALS) TABS tablet Take 1 tablet by mouth daily.    [name] atorvastatin (LIPITOR) 40 MG tablet Take 1 tablet (40 mg total) by mouth daily. 90 tablet 3  . lisinopril (ZESTRIL) 40 MG tablet Take 1 tablet (40 mg total) by mouth at bedtime. 90 tablet 3  . metFORMIN (GLUCOPHAGE-XR) 500 MG 24 hr tablet TAKE 4 TABLETS(2000 MG) BY MOUTH DAILY 360 tablet 0  . Coenzyme Q10 (COQ10) 200 MG CAPS Take 200 mg by mouth at bedtime. (Patient not taking: Reported on 10/26/2019)    . Continuous Blood Gluc Sensor (FREESTYLE LIBRE 14 DAY SENSOR) MISC 1 each by Other route every 14 (fourteen) days. WILL NOT COMPLETE PA. PATIENT WILL NOT BE USING INSURANCE. SELF PAY (Patient not taking: Reported on 10/26/2019) 2 each 0  . dapagliflozin propanediol (FARXIGA) 10 MG TABS tablet Take 10 mg by mouth daily. (Patient not taking: Reported on 10/26/2019) 90 tablet 3  . Ginkgo Biloba 500 MG CAPS Take 500 mg by mouth at bedtime.  (Patient not taking: Reported on 10/26/2019)    . Semaglutide (RYBELSUS) 14 MG TABS Take 14 mg by mouth every morning. (Patient not taking: Reported on 10/26/2019) 30 tablet 11   No facility-administered medications prior to visit.    Allergies  Allergen Reactions  . Penicillins Other (See Comments)    Driving couldn't see and head felt heavy  Has patient had a PCN reaction causing immediate rash, facial/tongue/throat swelling, SOB or lightheadedness with hypotension: No Has patient had a PCN reaction causing severe rash involving mucus membranes or skin necrosis: No Has patient had a PCN reaction that required hospitalization: No Has patient had a PCN reaction occurring within the last 10 years: No If all of the above answers are "NO", then may proceed with Cephalosporin use.   . Other Other (See Comments)      Allergic to beef and eggplant causes rash on skin    ROS Review of Systems  Constitutional: Negative.   HENT: Negative.   Eyes: Positive for visual disturbance.  Respiratory: Negative.   Cardiovascular: Negative.   Gastrointestinal: Negative.   Endocrine: Negative for polyphagia and polyuria.  Genitourinary: Negative for dysuria and hematuria.  Musculoskeletal: Negative for gait problem.  Skin: Negative for pallor and rash.  Allergic/Immunologic: Negative for immunocompromised state.  Neurological: Negative for speech difficulty and light-headedness.  Hematological: Does not bruise/bleed easily.  Psychiatric/Behavioral:  Negative.       Objective:    Physical Exam Vitals and nursing note reviewed.  Constitutional:      General: She is not in acute distress.    Appearance: Normal appearance. She is normal weight. She is not ill-appearing, toxic-appearing or diaphoretic.  HENT:     Head: Normocephalic and atraumatic.     Right Ear: External ear normal.     Left Ear: External ear normal.     Mouth/Throat:     Mouth: Mucous membranes are moist.     Pharynx: Oropharynx is clear.  Eyes:     General: No scleral icterus.       Right eye: No discharge.        Left eye: No discharge.     Extraocular Movements: Extraocular movements intact.     Conjunctiva/sclera: Conjunctivae normal.     Pupils: Pupils are equal, round, and reactive to light.  Cardiovascular:     Rate and Rhythm: Normal rate and regular rhythm.     Pulses: Normal pulses.     Heart sounds: Normal heart sounds.  Pulmonary:     Effort: Pulmonary effort is normal.     Breath sounds: Normal breath sounds.  Musculoskeletal:     Cervical back: No rigidity or tenderness.     Right lower leg: No edema.     Left lower leg: No edema.  Lymphadenopathy:     Cervical: No cervical adenopathy.  Neurological:     General: No focal deficit present.     Mental Status: She is alert and oriented to person, place, and  time.  Psychiatric:        Mood and Affect: Mood normal.        Behavior: Behavior normal.     BP (!) 150/80 (BP Location: Left Arm, Patient Position: Sitting, Cuff Size: Normal)   Pulse 89   Temp 99 F (37.2 C) (Oral)   Ht 5\' 1"  (1.549 m)   Wt 124 lb (56.2 kg)   SpO2 98%   BMI 23.43 kg/m  Wt Readings from Last 3 Encounters:  10/26/19 124 lb (56.2 kg)  09/26/19 126 lb 3.2 oz (57.2 kg)  07/31/19 126 lb 12.8 oz (57.5 kg)     Health Maintenance Due  Topic Date Due  . Hepatitis C Screening  Never done  . HIV Screening  Never done  . TETANUS/TDAP  Never done  . COLONOSCOPY  Never done  . FOOT EXAM  05/10/2019  . DEXA SCAN  Never done  . PNA vac Low Risk Adult (1 of 2 - PCV13) Never done    There are no preventive care reminders to display for this patient.  Lab Results  Component Value Date   TSH 2.42 10/13/2018   Lab Results  Component Value Date   WBC 9.7 05/25/2016   HGB 12.5 05/25/2016   HCT 37.9 05/25/2016   MCV 83.7 05/25/2016   PLT 165 05/25/2016   Lab Results  Component Value Date   NA 141 10/13/2018   K 4.7 10/13/2018   CO2 28 10/13/2018   GLUCOSE 135 (H) 10/13/2018   BUN 13 10/13/2018   CREATININE 0.87 10/13/2018   BILITOT 0.4 10/13/2018   ALKPHOS 70 10/13/2018   AST 16 10/13/2018   ALT 25 10/13/2018   PROT 7.4 10/13/2018   ALBUMIN 4.4 10/13/2018   CALCIUM 9.5 10/13/2018   ANIONGAP 11 05/21/2016   GFR 65.47 10/13/2018   Lab Results  Component Value Date  CHOL 159 03/06/2019   Lab Results  Component Value Date   HDL 46.70 03/06/2019   Lab Results  Component Value Date   LDLCALC 77 03/06/2019   Lab Results  Component Value Date   TRIG 174.0 (H) 03/06/2019   Lab Results  Component Value Date   CHOLHDL 3 03/06/2019   Lab Results  Component Value Date   HGBA1C 6.9 (A) 07/31/2019      Assessment & Plan:   Problem List Items Addressed This Visit      Cardiovascular and Mediastinum   Essential hypertension - Primary    Relevant Medications   lisinopril (ZESTRIL) 40 MG tablet   atorvastatin (LIPITOR) 40 MG tablet   Other Relevant Orders   Comprehensive metabolic panel   CBC   Microalbumin / creatinine urine ratio     Endocrine   Diabetes with retinopathy (HCC) (Chronic)   Relevant Medications   lisinopril (ZESTRIL) 40 MG tablet   atorvastatin (LIPITOR) 40 MG tablet   metFORMIN (GLUCOPHAGE-XR) 500 MG 24 hr tablet   Other Relevant Orders   Comprehensive metabolic panel   Hemoglobin A1c   Microalbumin / creatinine urine ratio   Ambulatory referral to Endocrinology   Retinopathy due to secondary diabetes mellitus (HCC)   Relevant Medications   lisinopril (ZESTRIL) 40 MG tablet   atorvastatin (LIPITOR) 40 MG tablet   metFORMIN (GLUCOPHAGE-XR) 500 MG 24 hr tablet   Other Relevant Orders   Ambulatory referral to Endocrinology     Other   Hyperlipidemia (Chronic)   Relevant Medications   lisinopril (ZESTRIL) 40 MG tablet   atorvastatin (LIPITOR) 40 MG tablet   Other Relevant Orders   Comprehensive metabolic panel   Lipid panel    Other Visit Diagnoses    Healthcare maintenance       Relevant Orders   Hepatitis C antibody      Meds ordered this encounter  Medications  . lisinopril (ZESTRIL) 40 MG tablet    Sig: Take 1 tablet (40 mg total) by mouth at bedtime.    Dispense:  90 tablet    Refill:  3  . atorvastatin (LIPITOR) 40 MG tablet    Sig: Take 1 tablet (40 mg total) by mouth daily.    Dispense:  90 tablet    Refill:  3  . metFORMIN (GLUCOPHAGE-XR) 500 MG 24 hr tablet    Sig: Take 2 tablets (1,000 mg total) by mouth in the morning and at bedtime.    Dispense:  360 tablet    Refill:  3    Follow-up: No follow-ups on file.   Encourage patient to continue all all of her medicines and avoid running out of them.  Blood pressure control is equally as important as diabetes control regarding her advancing retinopathy. Mliss Sax, MD

## 2019-10-27 ENCOUNTER — Other Ambulatory Visit (INDEPENDENT_AMBULATORY_CARE_PROVIDER_SITE_OTHER): Payer: Medicare Other

## 2019-10-27 DIAGNOSIS — E11319 Type 2 diabetes mellitus with unspecified diabetic retinopathy without macular edema: Secondary | ICD-10-CM

## 2019-10-27 DIAGNOSIS — E785 Hyperlipidemia, unspecified: Secondary | ICD-10-CM

## 2019-10-27 DIAGNOSIS — I10 Essential (primary) hypertension: Secondary | ICD-10-CM

## 2019-10-27 DIAGNOSIS — Z0000 Encounter for general adult medical examination without abnormal findings: Secondary | ICD-10-CM

## 2019-10-27 LAB — CBC
HCT: 40.9 % (ref 36.0–46.0)
Hemoglobin: 13.6 g/dL (ref 12.0–15.0)
MCHC: 33.3 g/dL (ref 30.0–36.0)
MCV: 87 fl (ref 78.0–100.0)
Platelets: 220 10*3/uL (ref 150.0–400.0)
RBC: 4.7 Mil/uL (ref 3.87–5.11)
RDW: 13.9 % (ref 11.5–15.5)
WBC: 6 10*3/uL (ref 4.0–10.5)

## 2019-10-27 LAB — LIPID PANEL
Cholesterol: 150 mg/dL (ref 0–200)
HDL: 45.6 mg/dL (ref >39.00)
LDL Cholesterol: 76 mg/dL (ref 0–99)
NonHDL: 103.93
Total CHOL/HDL Ratio: 3
Triglycerides: 141 mg/dL (ref 0.0–149.0)
VLDL: 28.2 mg/dL (ref 0.0–40.0)

## 2019-10-27 LAB — COMPREHENSIVE METABOLIC PANEL
ALT: 26 U/L (ref 0–35)
AST: 20 U/L (ref 0–37)
Albumin: 4.1 g/dL (ref 3.5–5.2)
Alkaline Phosphatase: 76 U/L (ref 39–117)
BUN: 11 mg/dL (ref 6–23)
CO2: 28 mEq/L (ref 19–32)
Calcium: 8.9 mg/dL (ref 8.4–10.5)
Chloride: 104 mEq/L (ref 96–112)
Creatinine, Ser: 0.7 mg/dL (ref 0.40–1.20)
GFR: 83.87 mL/min (ref >60.00)
Glucose, Bld: 135 mg/dL — ABNORMAL HIGH (ref 70–99)
Potassium: 3.9 mEq/L (ref 3.5–5.1)
Sodium: 139 mEq/L (ref 135–145)
Total Bilirubin: 0.4 mg/dL (ref 0.2–1.2)
Total Protein: 6.5 g/dL (ref 6.0–8.3)

## 2019-10-27 LAB — MICROALBUMIN / CREATININE URINE RATIO
Creatinine,U: 83.4 mg/dL
Microalb Creat Ratio: 1.2 mg/g (ref 0.0–30.0)
Microalb, Ur: 1 mg/dL (ref 0.0–1.9)

## 2019-10-27 LAB — HEMOGLOBIN A1C: Hgb A1c MFr Bld: 7.2 % — ABNORMAL HIGH (ref 4.6–6.5)

## 2019-10-30 ENCOUNTER — Other Ambulatory Visit: Payer: Self-pay

## 2019-10-30 ENCOUNTER — Ambulatory Visit: Payer: Medicare Other | Admitting: Endocrinology

## 2019-10-30 ENCOUNTER — Encounter (INDEPENDENT_AMBULATORY_CARE_PROVIDER_SITE_OTHER): Payer: Self-pay | Admitting: Ophthalmology

## 2019-10-30 ENCOUNTER — Ambulatory Visit (INDEPENDENT_AMBULATORY_CARE_PROVIDER_SITE_OTHER): Payer: Medicare Other | Admitting: Ophthalmology

## 2019-10-30 DIAGNOSIS — E113411 Type 2 diabetes mellitus with severe nonproliferative diabetic retinopathy with macular edema, right eye: Secondary | ICD-10-CM

## 2019-10-30 DIAGNOSIS — H35021 Exudative retinopathy, right eye: Secondary | ICD-10-CM | POA: Diagnosis not present

## 2019-10-30 LAB — HEPATITIS C ANTIBODY
Hepatitis C Ab: NONREACTIVE
SIGNAL TO CUT-OFF: 0.19 (ref <1.00)

## 2019-10-30 NOTE — Progress Notes (Signed)
10/30/2019     CHIEF COMPLAINT Patient presents for Retina Follow Up   HISTORY OF PRESENT ILLNESS: Marie [name] is a 64 y.o. female who presents to the clinic today for:   HPI    Retina Follow Up    Patient presents with  Diabetic Retinopathy.  In right eye.  This started 5 weeks ago.  Severity is mild.  Duration of 5 weeks.  Since onset it is stable.          Comments    5 Week Diabetic F/U OD, poss Eylea OD  Pt c/o worsening VA OD since last visit. Pt denies ocular pain. Stable VA OS. Pt c/o dark area in middle of VA OD. LBS: pt does not recall       Last edited by Ileana Roup, COA on 10/30/2019  1:55 PM. (History)      Referring physician: Mliss Sax, MD [address] [place],  [place] [address]  HISTORICAL INFORMATION:   Selected notes from the MEDICAL RECORD NUMBER    Lab Results  Component Value Date   HGBA1C 7.2 (H) 10/27/2019     CURRENT MEDICATIONS: No current outpatient medications on file. (Ophthalmic Drugs)   No current facility-administered medications for this visit. (Ophthalmic Drugs)   Current Outpatient Medications (Other)  Medication Sig  . amLODipine (NORVASC) 5 MG tablet Take 1 tablet (5 mg total) by mouth daily.  [name] aspirin EC 81 MG tablet Take 81 mg by mouth at bedtime.  [name] atorvastatin (LIPITOR) 40 MG tablet Take 1 tablet (40 mg total) by mouth daily.  . Cholecalciferol (VITAMIN D3) 3000 units TABS Take 3,000 Units by mouth at bedtime.  . Coenzyme Q10 (COQ10) 200 MG CAPS Take 200 mg by mouth at bedtime. (Patient not taking: Reported on 10/26/2019)  . Continuous Blood Gluc Sensor (FREESTYLE LIBRE 14 DAY SENSOR) MISC 1 each by Other route every 14 (fourteen) days. WILL NOT COMPLETE PA. PATIENT WILL NOT BE USING INSURANCE. SELF PAY (Patient not taking: Reported on 10/26/2019)  . dapagliflozin propanediol (FARXIGA) 10 MG TABS tablet Take 10 mg by mouth daily. (Patient not taking: Reported on 10/26/2019)  . Ginkgo Biloba 500 MG  CAPS Take 500 mg by mouth at bedtime.  (Patient not taking: Reported on 10/26/2019)  . glucose blood (ONETOUCH VERIO) test strip 1 each by Other route as needed for other. Use as instructed  . lisinopril (ZESTRIL) 40 MG tablet Take 1 tablet (40 mg total) by mouth at bedtime.  . metFORMIN (GLUCOPHAGE-XR) 500 MG 24 hr tablet Take 2 tablets (1,000 mg total) by mouth in the morning and at bedtime.  . Multiple Vitamin (MULTIVITAMIN WITH MINERALS) TABS tablet Take 1 tablet by mouth daily.  . Semaglutide (RYBELSUS) 14 MG TABS Take 14 mg by mouth every morning. (Patient not taking: Reported on 10/26/2019)   No current facility-administered medications for this visit. (Other)      REVIEW OF SYSTEMS:    ALLERGIES Allergies  Allergen Reactions  . Penicillins Other (See Comments)    Driving couldn't see and head felt heavy  Has patient had a PCN reaction causing immediate rash, facial/tongue/throat swelling, SOB or lightheadedness with hypotension: No Has patient had a PCN reaction causing severe rash involving mucus membranes or skin necrosis: No Has patient had a PCN reaction that required hospitalization: No Has patient had a PCN reaction occurring within the last 10 years: No If all of the above answers are "NO", then may proceed with Cephalosporin use.   [name]  Other Other (See Comments)    Allergic to beef and eggplant causes rash on skin    PAST MEDICAL HISTORY Past Medical History:  Diagnosis Date  . CVA (cerebral vascular accident) (HCC) 2015  . Depression   . Diabetes (HCC)    t2  . Gallstones   . Hyperlipemia   . Hypertension   . Migraines    Past Surgical History:  Procedure Laterality Date  . CESAREAN SECTION     x2  . CHOLECYSTECTOMY N/A 05/25/2016   Procedure: LAPAROSCOPIC CHOLECYSTECTOMY WITH INTRAOPERATIVE CHOLANGIOGRAM;  Surgeon: Luretha Murphy, MD;  Location: [facility];  Service: General;  Laterality: N/A;  . IR GENERIC HISTORICAL  03/13/2016   IR PERC CHOLECYSTOSTOMY  03/13/2016 [name], MD [facility]  . IR GENERIC HISTORICAL  04/28/2016   IR RADIOLOGIST EVAL & MGMT 04/28/2016 [name], MD [facility]  . LOOP RECORDER IMPLANT N/A 11/29/2013   Procedure: LOOP RECORDER IMPLANT;  Surgeon: Marinus Maw, MD;  Location: [facility] CATH LAB;  Service: Cardiovascular;  Laterality: N/A;  . TEE WITHOUT CARDIOVERSION N/A 08/24/2013   Procedure: TRANSESOPHAGEAL ECHOCARDIOGRAM (TEE);  Surgeon: [name], MD;  Location: [facility] ENDOSCOPY;  Service: Cardiovascular;  Laterality: N/A;    FAMILY HISTORY Family History  Problem Relation Age of Onset  . Stomach cancer Mother   . High blood pressure Mother   . Diabetes Mother   . Diabetes Maternal Grandfather   . High blood pressure Maternal Grandfather     SOCIAL HISTORY Social History   Tobacco Use  . Smoking status: Never Smoker  . Smokeless tobacco: Never Used  Vaping Use  . Vaping Use: Never used  Substance Use Topics  . Alcohol use: No    Alcohol/week: 1.0 standard drink    Types: 1 Standard drinks or equivalent per week    Comment: Social  . Drug use: No         OPHTHALMIC EXAM:  Base Eye Exam    Visual Acuity (ETDRS)      Right Left   Dist [place] CF @ 2' 20/20   Dist ph [place] NI        Tonometry (Tonopen, 1:48 PM)      Right Left   Pressure 16 19       Pupils      Pupils Dark Light Shape React APD   Right PERRL 4 3 Round Brisk None   Left PERRL 4 3 Round Brisk None       Visual Fields (Counting fingers)      Left Right    Full Full       Extraocular Movement      Right Left    Full Full       Neuro/Psych    Oriented x3: Yes   Mood/Affect: Normal       Dilation    Right eye: 1.0% Mydriacyl, 2.5% Phenylephrine @ 1:49 PM          IMAGING AND PROCEDURES  Imaging and Procedures for 10/30/19  OCT, Retina - OU - Both Eyes       Right Eye Quality was good. Scan locations included subfoveal. Central Foveal Thickness: 699. Progression has worsened. Findings  include cystoid macular edema.   Left Eye Quality was good. Central Foveal Thickness: 279. Progression has been stable. Findings include no SRF, no IRF.   Notes Baths, no active CSME.  OD now 4-1/2 to 5 weeks after intravitreal Eylea for increasing CSME.  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  Differential diagnosis would also be an adult Coats disease of serous retinal detachment in any case of that area requiring gentle laser photocoagulation to attempt to eradicate the leaking lesion.                  ASSESSMENT/PLAN:  Severe nonproliferative diabetic retinopathy of right eye, with macular edema, associated with type 2 diabetes mellitus (HCC) CSME OD, worsening despite the best proven agent intravitreal Eylea, now this is clearly a secondary phenomenon from subretinal fluid serous detachment accumulation from an apparent retinal artery macro aneurysm and for adult Coats disease with leaking still superior to the optic nerve secondarily extending into the papillomacular bundle now worsening in the subretinal space of the macula.  We will suggest fluorescein angiography to confirm the leaking site and will then proceed with laser photocoagulation ablation based upon those findings.      ICD-10-CM   1. Severe nonproliferative diabetic retinopathy of right eye, with macular edema, associated with type 2 diabetes mellitus (HCC)  E11.3411 OCT, Retina - OU - Both Eyes    1.  Fluorescein angiography over the last 3 to 4 years were reviewed.  There are diffuse findings of capillary dropout throughout the posterior pole peripherally of each eye but more pronounced in the right eye.  The region at superior to the optic nerve which has large blebs of vessels on clinical examination and also on angiographic evaluation I believe are causing a retinal cord  type shunt and/or is adult Coats disease and thus will require direct ablation.  Nonetheless the etiology of these need to be redefined as compared to the last fluorescein performed nearly 1 year previously in October 2020.  2.  3.  Ophthalmic Meds Ordered this visit:  No orders of the defined types were placed in this encounter.      Return in about 1 day (around 10/31/2019) for OPTOS FFA R/L, COLOR FP, and likely focal ablation OD.[name]  Patient Instructions  Patient the need for fluorescein angiography to be repeated to use fluorescein guided laser photocoagulation to ablate the causative lesions    Explained the diagnoses, plan, and follow up with the patient and they expressed understanding.  Patient expressed understanding of the importance of proper follow up care.   Alford Highland [place] Emmitt M.D. Diseases & Surgery of the Retina and Vitreous [facility] 10/30/19     Abbreviations: M myopia (nearsighted); A astigmatism; H hyperopia (farsighted); P presbyopia; Mrx spectacle prescription;  CTL contact lenses; OD right eye; OS left eye; OU both eyes  XT exotropia; ET esotropia; PEK punctate epithelial keratitis; PEE punctate epithelial erosions; DES dry eye syndrome; MGD meibomian gland dysfunction; ATs artificial tears; PFAT's preservative free artificial tears; NSC nuclear sclerotic cataract; PSC posterior subcapsular cataract; ERM epi-retinal membrane; PVD posterior vitreous detachment; RD retinal detachment; DM diabetes mellitus; DR diabetic retinopathy; NPDR non-proliferative diabetic retinopathy; PDR proliferative diabetic retinopathy; CSME clinically significant macular edema; DME diabetic macular edema; dbh dot blot hemorrhages; CWS cotton wool spot; POAG primary open angle glaucoma; C/D cup-to-disc ratio; HVF humphrey visual field; GVF goldmann visual field; OCT optical coherence tomography; IOP intraocular pressure; BRVO Branch retinal vein occlusion; CRVO central retinal  vein occlusion; CRAO central retinal artery occlusion; BRAO branch retinal artery occlusion; RT retinal tear; SB scleral buckle; PPV pars plana vitrectomy; VH Vitreous  hemorrhage; PRP panretinal laser photocoagulation; IVK intravitreal kenalog; VMT vitreomacular traction; MH Macular hole;  NVD neovascularization of the disc; NVE neovascularization elsewhere; AREDS age related eye disease study; ARMD age related macular degeneration; POAG primary open angle glaucoma; EBMD epithelial/anterior basement membrane dystrophy; ACIOL anterior chamber intraocular lens; IOL intraocular lens; PCIOL posterior chamber intraocular lens; Phaco/IOL phacoemulsification with intraocular lens placement; [place] photorefractive keratectomy; LASIK laser assisted in situ keratomileusis; HTN hypertension; DM diabetes mellitus; COPD chronic obstructive pulmonary disease

## 2019-10-30 NOTE — Assessment & Plan Note (Signed)
CSME OD, worsening despite the best proven agent intravitreal Eylea, now this is clearly a secondary phenomenon from subretinal fluid serous detachment accumulation from an apparent retinal artery macro aneurysm and for adult Coats disease with leaking still superior to the optic nerve secondarily extending into the papillomacular bundle now worsening in the subretinal space of the macula.  We will suggest fluorescein angiography to confirm the leaking site and will then proceed with laser photocoagulation ablation based upon those findings.

## 2019-10-30 NOTE — Patient Instructions (Signed)
Patient the need for fluorescein angiography to be repeated to use fluorescein guided laser photocoagulation to ablate the causative lesions

## 2019-10-31 ENCOUNTER — Telehealth: Payer: Self-pay | Admitting: Family Medicine

## 2019-10-31 ENCOUNTER — Ambulatory Visit (INDEPENDENT_AMBULATORY_CARE_PROVIDER_SITE_OTHER): Payer: Medicare Other | Admitting: Ophthalmology

## 2019-10-31 ENCOUNTER — Encounter (INDEPENDENT_AMBULATORY_CARE_PROVIDER_SITE_OTHER): Payer: Self-pay | Admitting: Ophthalmology

## 2019-10-31 DIAGNOSIS — E113411 Type 2 diabetes mellitus with severe nonproliferative diabetic retinopathy with macular edema, right eye: Secondary | ICD-10-CM

## 2019-10-31 DIAGNOSIS — H35049 Retinal micro-aneurysms, unspecified, unspecified eye: Secondary | ICD-10-CM

## 2019-10-31 DIAGNOSIS — E113492 Type 2 diabetes mellitus with severe nonproliferative diabetic retinopathy without macular edema, left eye: Secondary | ICD-10-CM | POA: Diagnosis not present

## 2019-10-31 DIAGNOSIS — H35021 Exudative retinopathy, right eye: Secondary | ICD-10-CM

## 2019-10-31 MED ORDER — FLUORESCEIN SODIUM 10 % IV SOLN
500.0000 mg | INTRAVENOUS | Status: AC | PRN
  Administered 2019-10-31: 500 mg via INTRAVENOUS

## 2019-10-31 NOTE — Telephone Encounter (Signed)
Patient would like call to review recent lab results including Hep C.

## 2019-10-31 NOTE — Assessment & Plan Note (Signed)
See the overview above, focal laser ablation to causative lesion was attempted today.  Next examination will be an attempt to see if there is any stabilization of her serous retinal detachment but in addition, be prepared to deliver intravitreal Ozurdex, steroid.

## 2019-10-31 NOTE — Progress Notes (Signed)
10/31/2019     CHIEF COMPLAINT Patient presents for Retina Follow Up   HISTORY OF PRESENT ILLNESS: Marie [name] is a 64 y.o. female who presents to the clinic today for:   HPI    Retina Follow Up    Patient presents with  Diabetic Retinopathy.  In both eyes.  Duration of 1 day.  Since onset it is stable.  I, the attending physician,  performed the HPI with the patient and updated documentation appropriately.          Comments    1 day follow up - Poss Focal ablation of lesion(s) OD, guided by  FFA R/L today Patient denies change in vision and overall has no complaints. LBS unknown /// A1C unknown        Last edited by Edmon Crape, MD on 10/31/2019  2:20 PM. (History)      Referring physician: Mliss Sax, MD [address] [place],  [place] [address]  HISTORICAL INFORMATION:   Selected notes from the MEDICAL RECORD NUMBER    Lab Results  Component Value Date   HGBA1C 7.2 (H) 10/27/2019     CURRENT MEDICATIONS: No current outpatient medications on file. (Ophthalmic Drugs)   No current facility-administered medications for this visit. (Ophthalmic Drugs)   Current Outpatient Medications (Other)  Medication Sig   amLODipine (NORVASC) 5 MG tablet Take 1 tablet (5 mg total) by mouth daily.   aspirin EC 81 MG tablet Take 81 mg by mouth at bedtime.   atorvastatin (LIPITOR) 40 MG tablet Take 1 tablet (40 mg total) by mouth daily.   Cholecalciferol (VITAMIN D3) 3000 units TABS Take 3,000 Units by mouth at bedtime.   Coenzyme Q10 (COQ10) 200 MG CAPS Take 200 mg by mouth at bedtime. (Patient not taking: Reported on 10/26/2019)   Continuous Blood Gluc Sensor (FREESTYLE LIBRE 14 DAY SENSOR) MISC 1 each by Other route every 14 (fourteen) days. WILL NOT COMPLETE PA. PATIENT WILL NOT BE USING INSURANCE. SELF PAY (Patient not taking: Reported on 10/26/2019)   dapagliflozin propanediol (FARXIGA) 10 MG TABS tablet Take 10 mg by mouth daily. (Patient not  taking: Reported on 10/26/2019)   Ginkgo Biloba 500 MG CAPS Take 500 mg by mouth at bedtime.  (Patient not taking: Reported on 10/26/2019)   glucose blood (ONETOUCH VERIO) test strip 1 each by Other route as needed for other. Use as instructed   lisinopril (ZESTRIL) 40 MG tablet Take 1 tablet (40 mg total) by mouth at bedtime.   metFORMIN (GLUCOPHAGE-XR) 500 MG 24 hr tablet Take 2 tablets (1,000 mg total) by mouth in the morning and at bedtime.   Multiple Vitamin (MULTIVITAMIN WITH MINERALS) TABS tablet Take 1 tablet by mouth daily.   Semaglutide (RYBELSUS) 14 MG TABS Take 14 mg by mouth every morning. (Patient not taking: Reported on 10/26/2019)   No current facility-administered medications for this visit. (Other)      REVIEW OF SYSTEMS:    ALLERGIES Allergies  Allergen Reactions   Penicillins Other (See Comments)    Driving couldn't see and head felt heavy  Has patient had a PCN reaction causing immediate rash, facial/tongue/throat swelling, SOB or lightheadedness with hypotension: No Has patient had a PCN reaction causing severe rash involving mucus membranes or skin necrosis: No Has patient had a PCN reaction that required hospitalization: No Has patient had a PCN reaction occurring within the last 10 years: No If all of the above answers are "NO", then may proceed with Cephalosporin  use.    Other Other (See Comments)    Allergic to beef and eggplant causes rash on skin    PAST MEDICAL HISTORY Past Medical History:  Diagnosis Date   CVA (cerebral vascular accident) (HCC) 2015   Depression    Diabetes (HCC)    t2   Gallstones    Hyperlipemia    Hypertension    Migraines    Past Surgical History:  Procedure Laterality Date   CESAREAN SECTION     x2   CHOLECYSTECTOMY N/A 05/25/2016   Procedure: LAPAROSCOPIC CHOLECYSTECTOMY WITH INTRAOPERATIVE CHOLANGIOGRAM;  Surgeon: Luretha Murphy, MD;  Location: [facility] ORS;  Service: General;  Laterality: N/A;   IR  GENERIC HISTORICAL  03/13/2016   IR PERC CHOLECYSTOSTOMY 03/13/2016 [name], MD [facility] RAD   IR GENERIC HISTORICAL  04/28/2016   IR RADIOLOGIST EVAL & MGMT 04/28/2016 [name], MD [facility]   LOOP RECORDER IMPLANT N/A 11/29/2013   Procedure: LOOP RECORDER IMPLANT;  Surgeon: Marinus Maw, MD;  Location: [facility] CATH LAB;  Service: Cardiovascular;  Laterality: N/A;   TEE WITHOUT CARDIOVERSION N/A 08/24/2013   Procedure: TRANSESOPHAGEAL ECHOCARDIOGRAM (TEE);  Surgeon: [name], MD;  Location: [facility] ENDOSCOPY;  Service: Cardiovascular;  Laterality: N/A;    FAMILY HISTORY Family History  Problem Relation Age of Onset   Stomach cancer Mother    High blood pressure Mother    Diabetes Mother    Diabetes Maternal Grandfather    High blood pressure Maternal Grandfather     SOCIAL HISTORY Social History   Tobacco Use   Smoking status: Never Smoker   Smokeless tobacco: Never Used  Building services engineer Use: Never used  Substance Use Topics   Alcohol use: No    Alcohol/week: 1.0 standard drink    Types: 1 Standard drinks or equivalent per week    Comment: Social   Drug use: No         OPHTHALMIC EXAM:  Base Eye Exam    Visual Acuity (Snellen - Linear)      Right Left   Dist [place] CF @ 3' 20/25-2   Dist ph [place] NI        Tonometry (Tonopen, 1:03 PM)      Right Left   Pressure 11 13       Pupils      Pupils Dark Light Shape React APD   Right PERRL 4 3 Round Brisk None   Left PERRL 4 3 Round Brisk None       Visual Fields (Counting fingers)      Left Right    Full Full       Extraocular Movement      Right Left    Full Full       Neuro/Psych    Oriented x3: Yes   Mood/Affect: Normal       Dilation    Both eyes: 1.0% Mydriacyl, 2.5% Phenylephrine @ 1:03 PM        Slit Lamp and Fundus Exam    External Exam      Right Left   External Normal Normal       Slit Lamp Exam      Right Left   Lids/Lashes Normal Normal    Conjunctiva/Sclera White and quiet White and quiet   Cornea Clear Clear   Anterior Chamber Deep and quiet Deep and quiet   Iris Round and reactive Round and reactive   Lens Posterior chamber intraocular lens  Vitreous Normal Normal          IMAGING AND PROCEDURES  Imaging and Procedures for 10/31/19  Color Fundus Photography Optos - OU - Both Eyes       Right Eye Progression has worsened. Disc findings include normal observations. Macula : edema, microaneurysms. Vessels : IRMA. Periphery : hemorrhage.   Left Eye Progression has been stable. Disc findings include normal observations. Macula : microaneurysms. Periphery : hemorrhage.   Notes OD with large intraretinal hemorrhage around a large microaneurysm or even a macular aneurysmal change.  This is most characteristic however of either a ram which is growing with edema or with adult Coats disease as defined by the fluorescein angiographic findings.  Can [place] edema has extended out 1.5 disc diameters from this area  OS with more standard severe nonproliferative diabetic retinopathy without macular edema       Fluorescein Angiography Optos (Transit OD)       Injection:  500 mg Fluorescein Sodium 10 % injection   NDC: [phone]   Route: Intravenous, Site: Left ArmRight Eye   Progression has worsened. Early phase findings include microaneurysm, leakage. Mid/Late phase findings include vascular perfusion defect, leakage, microaneurysm.   Left Eye   Progression has been stable. Mid/Late phase findings include microaneurysm.   Notes OD with severe microaneurysmal changes with lights based capillaries without leakage both in the temporal periphery and in the horizontal periphery along the horizontal raphae.  These are more characteristic of Leber's miliary aneurysms or adult Coats disease type changes.  I prominent large area of intraretinal and a microaneurysm can't capillary dropout are noted superior to the optic  nerve and appear to be the causative lesion to this subretinal fluid exudate now in the subfoveal location in the macula OD.  Most importantly the serous retinal detachment seen on OCT in the right eye is not from a choroidal neovascular membrane OD.       Focal Laser - OD - Right Eye       Time Out Confirmed correct patient, procedure, site, and patient consented.   Anesthesia Topical anesthesia was used. Anesthetic medications included Proparacaine 0.5%.   Laser Information The type of laser was diode. Color was yellow. The duration in seconds was 0.5. The spot size was 390 microns. Laser power was 260. Total spots was 168.   Post-op The patient tolerated the procedure well. There were no complications. The patient received written and verbal post procedure care education.   Notes Focal laser ablation to the lesion superior to the optic nerve was performed in a confluent fashion at 0.5-second duration spot applications in an ablative attempt.  Some grid spaced lesions were then applied in the area of edema surrounding this                ASSESSMENT/PLAN:  Coat's disease, right See the overview above, focal laser ablation to causative lesion was attempted today.  Next examination will be an attempt to see if there is any stabilization of her serous retinal detachment but in addition, be prepared to deliver intravitreal Ozurdex, steroid.      ICD-10-CM   1. Coat's disease, right  H35.021 Color Fundus Photography Optos - OU - Both Eyes    Fluorescein Angiography Optos (Transit OD)    Fluorescein Sodium 10 % injection 500 mg    Focal Laser - OD - Right Eye  2. Severe nonproliferative diabetic retinopathy of right eye with macular edema associated with type 2 diabetes mellitus (HCC)  Z61.0960E11.3411 Color Fundus Photography Optos - OU - Both Eyes    Fluorescein Angiography Optos (Transit OD)    Fluorescein Sodium 10 % injection 500 mg    Focal Laser - OD - Right Eye  3. Severe  nonproliferative diabetic retinopathy of left eye without macular edema associated with type 2 diabetes mellitus (HCC)  A54.0981E11.3492 Color Fundus Photography Optos - OU - Both Eyes  4. Miliary aneurysms of retina  H35.049 Focal Laser - OD - Right Eye    1.  Focal laser ablation of region superior to the optic nerve OD with appearance of adult Coats disease.  2.  Return follow-up visit in 2 weeks to monitor the clinical response to this laser ablation.  3.  We will be prepared to deliver intravitreal Ozurdex should ongoing edema develop and persist since the patient's right eye is resistant to Avastin and now Eylea  Ophthalmic Meds Ordered this visit:  Meds ordered this encounter  Medications   Fluorescein Sodium 10 % injection 500 mg       Return in about 2 weeks (around 11/14/2019) for dilate, OD, COLOR FP, OCT, possible injection Ozurdex.  There are no Patient Instructions on file for this visit.   Explained the diagnoses, plan, and follow up with the patient and they expressed understanding.  Patient expressed understanding of the importance of proper follow up care.   Alford HighlandGary A. Ismaeel Arvelo M.D. Diseases & Surgery of the Retina and Vitreous [facility] 10/31/19     Abbreviations: M myopia (nearsighted); A astigmatism; H hyperopia (farsighted); P presbyopia; Mrx spectacle prescription;  CTL contact lenses; OD right eye; OS left eye; OU both eyes  XT exotropia; ET esotropia; PEK punctate epithelial keratitis; PEE punctate epithelial erosions; DES dry eye syndrome; MGD meibomian gland dysfunction; ATs artificial tears; PFAT's preservative free artificial tears; NSC nuclear sclerotic cataract; PSC posterior subcapsular cataract; ERM epi-retinal membrane; PVD posterior vitreous detachment; RD retinal detachment; DM diabetes mellitus; DR diabetic retinopathy; NPDR non-proliferative diabetic retinopathy; PDR proliferative diabetic retinopathy; CSME clinically significant macular  edema; DME diabetic macular edema; dbh dot blot hemorrhages; CWS cotton wool spot; POAG primary open angle glaucoma; C/D cup-to-disc ratio; HVF humphrey visual field; GVF goldmann visual field; OCT optical coherence tomography; IOP intraocular pressure; BRVO Branch retinal vein occlusion; CRVO central retinal vein occlusion; CRAO central retinal artery occlusion; BRAO branch retinal artery occlusion; RT retinal tear; SB scleral buckle; PPV pars plana vitrectomy; VH Vitreous hemorrhage; PRP panretinal laser photocoagulation; IVK intravitreal kenalog; VMT vitreomacular traction; MH Macular hole;  NVD neovascularization of the disc; NVE neovascularization elsewhere; AREDS age related eye disease study; ARMD age related macular degeneration; POAG primary open angle glaucoma; EBMD epithelial/anterior basement membrane dystrophy; ACIOL anterior chamber intraocular lens; IOL intraocular lens; PCIOL posterior chamber intraocular lens; Phaco/IOL phacoemulsification with intraocular lens placement; PRK photorefractive keratectomy; LASIK laser assisted in situ keratomileusis; HTN hypertension; DM diabetes mellitus; COPD chronic obstructive pulmonary disease

## 2019-11-01 ENCOUNTER — Other Ambulatory Visit: Payer: Self-pay | Admitting: Obstetrics & Gynecology

## 2019-11-01 DIAGNOSIS — N6489 Other specified disorders of breast: Secondary | ICD-10-CM

## 2019-11-02 NOTE — Telephone Encounter (Signed)
Spoke with patient who no longer have questions regarding labs.

## 2019-11-16 ENCOUNTER — Ambulatory Visit (INDEPENDENT_AMBULATORY_CARE_PROVIDER_SITE_OTHER): Payer: Medicare Other | Admitting: Ophthalmology

## 2019-11-16 ENCOUNTER — Other Ambulatory Visit: Payer: Self-pay

## 2019-11-16 ENCOUNTER — Encounter (INDEPENDENT_AMBULATORY_CARE_PROVIDER_SITE_OTHER): Payer: Self-pay | Admitting: Ophthalmology

## 2019-11-16 DIAGNOSIS — E113411 Type 2 diabetes mellitus with severe nonproliferative diabetic retinopathy with macular edema, right eye: Secondary | ICD-10-CM

## 2019-11-16 DIAGNOSIS — H35021 Exudative retinopathy, right eye: Secondary | ICD-10-CM | POA: Diagnosis not present

## 2019-11-16 NOTE — Assessment & Plan Note (Signed)
Improved macular edema only 2-week status post focal ablation of the leaking lesions of lebers miliary aneurysms or Coats disease,

## 2019-11-16 NOTE — Progress Notes (Signed)
11/16/2019     CHIEF COMPLAINT Patient presents for Retina Follow Up   HISTORY OF PRESENT ILLNESS: Marie [name] is a 64 y.o. female who presents to the clinic today for:   HPI    Retina Follow Up    Diagnosis: Coat's Disease.  In right eye.  Severity is moderate.  Duration of 2 weeks.  Since onset it is stable.  I, the attending physician,  performed the HPI with the patient and updated documentation appropriately.          Comments    2 Week f\u OD. Possible Ozurdex OD. OC  Pt states vision has improved in OD. Denies any new complaints       Last edited by Elyse Jarvis on 11/16/2019  1:11 PM. (History)      Referring physician: Mliss Sax, MD [address] [place],  [place] [address]  HISTORICAL INFORMATION:   Selected notes from the MEDICAL RECORD NUMBER    Lab Results  Component Value Date   HGBA1C 7.2 (H) 10/27/2019     CURRENT MEDICATIONS: No current outpatient medications on file. (Ophthalmic Drugs)   No current facility-administered medications for this visit. (Ophthalmic Drugs)   Current Outpatient Medications (Other)  Medication Sig  . amLODipine (NORVASC) 5 MG tablet Take 1 tablet (5 mg total) by mouth daily.  [name] aspirin EC 81 MG tablet Take 81 mg by mouth at bedtime.  [name] atorvastatin (LIPITOR) 40 MG tablet Take 1 tablet (40 mg total) by mouth daily.  . Cholecalciferol (VITAMIN D3) 3000 units TABS Take 3,000 Units by mouth at bedtime.  . Coenzyme Q10 (COQ10) 200 MG CAPS Take 200 mg by mouth at bedtime. (Patient not taking: Reported on 10/26/2019)  . Continuous Blood Gluc Sensor (FREESTYLE LIBRE 14 DAY SENSOR) MISC 1 each by Other route every 14 (fourteen) days. WILL NOT COMPLETE PA. PATIENT WILL NOT BE USING INSURANCE. SELF PAY (Patient not taking: Reported on 10/26/2019)  . dapagliflozin propanediol (FARXIGA) 10 MG TABS tablet Take 10 mg by mouth daily. (Patient not taking: Reported on 10/26/2019)  . Ginkgo Biloba 500 MG CAPS Take 500  mg by mouth at bedtime.  (Patient not taking: Reported on 10/26/2019)  . glucose blood (ONETOUCH VERIO) test strip 1 each by Other route as needed for other. Use as instructed  . lisinopril (ZESTRIL) 40 MG tablet Take 1 tablet (40 mg total) by mouth at bedtime.  . metFORMIN (GLUCOPHAGE-XR) 500 MG 24 hr tablet Take 2 tablets (1,000 mg total) by mouth in the morning and at bedtime.  . Multiple Vitamin (MULTIVITAMIN WITH MINERALS) TABS tablet Take 1 tablet by mouth daily.  . Semaglutide (RYBELSUS) 14 MG TABS Take 14 mg by mouth every morning. (Patient not taking: Reported on 10/26/2019)   No current facility-administered medications for this visit. (Other)      REVIEW OF SYSTEMS:    ALLERGIES Allergies  Allergen Reactions  . Penicillins Other (See Comments)    Driving couldn't see and head felt heavy  Has patient had a PCN reaction causing immediate rash, facial/tongue/throat swelling, SOB or lightheadedness with hypotension: No Has patient had a PCN reaction causing severe rash involving mucus membranes or skin necrosis: No Has patient had a PCN reaction that required hospitalization: No Has patient had a PCN reaction occurring within the last 10 years: No If all of the above answers are "NO", then may proceed with Cephalosporin use.   . Other Other (See Comments)    Allergic to  beef and eggplant causes rash on skin    PAST MEDICAL HISTORY Past Medical History:  Diagnosis Date  . CVA (cerebral vascular accident) (HCC) 2015  . Depression   . Diabetes (HCC)    t2  . Gallstones   . Hyperlipemia   . Hypertension   . Migraines    Past Surgical History:  Procedure Laterality Date  . CESAREAN SECTION     x2  . CHOLECYSTECTOMY N/A 05/25/2016   Procedure: LAPAROSCOPIC CHOLECYSTECTOMY WITH INTRAOPERATIVE CHOLANGIOGRAM;  Surgeon: Luretha Murphy, MD;  Location: [facility];  Service: General;  Laterality: N/A;  . IR GENERIC HISTORICAL  03/13/2016   IR PERC CHOLECYSTOSTOMY 03/13/2016 [name], MD [facility]INTER[facility] RAD  . IR GENERIC HISTORICAL  04/28/2016   IR RADIOLOGIST EVAL & MGMT 04/28/2016 [name], MD [facility]  . LOOP RECORDER IMPLANT N/A 11/29/2013   Procedure: LOOP RECORDER IMPLANT;  Surgeon: Marinus Maw, MD;  Location: [facility] CATH LAB;  Service: Cardiovascular;  Laterality: N/A;  . TEE WITHOUT CARDIOVERSION N/A 08/24/2013   Procedure: TRANSESOPHAGEAL ECHOCARDIOGRAM (TEE);  Surgeon: [name], MD;  Location: [facility] ENDOSCOPY;  Service: Cardiovascular;  Laterality: N/A;    FAMILY HISTORY Family History  Problem Relation Age of Onset  . Stomach cancer Mother   . High blood pressure Mother   . Diabetes Mother   . Diabetes Maternal Grandfather   . High blood pressure Maternal Grandfather     SOCIAL HISTORY Social History   Tobacco Use  . Smoking status: Never Smoker  . Smokeless tobacco: Never Used  Vaping Use  . Vaping Use: Never used  Substance Use Topics  . Alcohol use: No    Alcohol/week: 1.0 standard drink    Types: 1 Standard drinks or equivalent per week    Comment: Social  . Drug use: No         OPHTHALMIC EXAM:  Base Eye Exam    Visual Acuity (Snellen - Linear)      Right Left   Dist [place] 20/400 20/20 -1       Tonometry (Tonopen, 1:15 PM)      Right Left   Pressure 10 13       Pupils      Dark Light Shape React APD   Right 5 4 Round Brisk None   Left 5 4 Round Brisk None       Visual Fields (Counting fingers)      Left Right    Full Full       Neuro/Psych    Oriented x3: Yes   Mood/Affect: Normal       Dilation    Right eye: 1.0% Mydriacyl, 2.5% Phenylephrine @ 1:15 PM        Slit Lamp and Fundus Exam    External Exam      Right Left   External Normal Normal       Slit Lamp Exam      Right Left   Lids/Lashes Normal Normal   Conjunctiva/Sclera White and quiet White and quiet   Cornea Clear Clear   Anterior Chamber Deep and quiet Deep and quiet   Iris Round and reactive Round and reactive   Lens  Posterior chamber intraocular lens Clear   Anterior Vitreous Normal Normal       Fundus Exam      Right Left   Posterior Vitreous Posterior vitreous detachment    Disc Normal    C/D Ratio 0.5    Macula Focal laser scars,  Much less Macular thickening, Microaneurysms, Clinically significant macular edema, Improved  clinically significant macular edema    Vessels Retinopathy,, severe    Periphery Normal           IMAGING AND PROCEDURES  Imaging and Procedures for 11/16/19  OCT, Retina - OU - Both Eyes       Right Eye Quality was good. Scan locations included subfoveal. Central Foveal Thickness: 511. Progression has improved. Findings include cystoid macular edema.   Left Eye Quality was good. Scan locations included subfoveal. Central Foveal Thickness: 276. Progression has been stable.   Notes Vastly improved proved CME and resolved subfoveal serous retinal detachment, only 2 weeks status post focal ablation. We will continue to observe                ASSESSMENT/PLAN:  Coat's disease, right Now 3 weeks status post laser ablation of vascular leaking lesion superior to the optic nerve in the right eye with a huge improvement in retinal thickening, this does suggest that this is an adult Coats disease superimposed upon diabetic retinopathy.  For this reason the Ozurdex which is available today for delivery will not be delivered intravitreally and we will await further resolution post focal laser ablation  Severe nonproliferative diabetic retinopathy of right eye, with macular edema, associated with type 2 diabetes mellitus (HCC) Improved macular edema only 2-week status post focal ablation of the leaking lesions of lebers miliary aneurysms or Coats disease,      ICD-10-CM   1. Coat's disease, right  H35.021 OCT, Retina - OU - Both Eyes  2. Severe nonproliferative diabetic retinopathy of right eye, with macular edema, associated with type 2 diabetes mellitus (HCC)   E11.3411     1. We will continue to observe OD as there is been a huge improvement in the findings. No intravitreal injections of the liver today  2.  3.  Ophthalmic Meds Ordered this visit:  No orders of the defined types were placed in this encounter.      Return in about 7 weeks (around 01/04/2020) for dilate, OD, OCT, possible ozurdex.  There are no Patient Instructions on file for this visit.   Explained the diagnoses, plan, and follow up with the patient and they expressed understanding.  Patient expressed understanding of the importance of proper follow up care.   Alford Highland Neel Buffone M.D. Diseases & Surgery of the Retina and Vitreous [facility] 11/16/19     Abbreviations: M myopia (nearsighted); A astigmatism; H hyperopia (farsighted); P presbyopia; Mrx spectacle prescription;  CTL contact lenses; OD right eye; OS left eye; OU both eyes  XT exotropia; ET esotropia; PEK punctate epithelial keratitis; PEE punctate epithelial erosions; DES dry eye syndrome; MGD meibomian gland dysfunction; ATs artificial tears; PFAT's preservative free artificial tears; NSC nuclear sclerotic cataract; PSC posterior subcapsular cataract; ERM epi-retinal membrane; PVD posterior vitreous detachment; RD retinal detachment; DM diabetes mellitus; DR diabetic retinopathy; NPDR non-proliferative diabetic retinopathy; PDR proliferative diabetic retinopathy; CSME clinically significant macular edema; DME diabetic macular edema; dbh dot blot hemorrhages; CWS cotton wool spot; POAG primary open angle glaucoma; C/D cup-to-disc ratio; HVF humphrey visual field; GVF goldmann visual field; OCT optical coherence tomography; IOP intraocular pressure; BRVO Branch retinal vein occlusion; CRVO central retinal vein occlusion; CRAO central retinal artery occlusion; BRAO branch retinal artery occlusion; RT retinal tear; SB scleral buckle; PPV pars plana vitrectomy; VH Vitreous hemorrhage; PRP panretinal laser  photocoagulation; IVK intravitreal kenalog; VMT vitreomacular traction; MH Macular hole;  NVD neovascularization of the disc; NVE neovascularization elsewhere; AREDS age related eye disease study; ARMD age related macular degeneration; POAG primary open angle glaucoma; EBMD epithelial/anterior basement membrane dystrophy; ACIOL anterior chamber intraocular lens; IOL intraocular lens; PCIOL posterior chamber intraocular lens; Phaco/IOL phacoemulsification with intraocular lens placement; [place] photorefractive keratectomy; LASIK laser assisted in situ keratomileusis; HTN hypertension; DM diabetes mellitus; COPD chronic obstructive pulmonary disease

## 2019-11-16 NOTE — Assessment & Plan Note (Signed)
Now 3 weeks status post laser ablation of vascular leaking lesion superior to the optic nerve in the right eye with a huge improvement in retinal thickening, this does suggest that this is an adult Coats disease superimposed upon diabetic retinopathy.  For this reason the Ozurdex which is available today for delivery will not be delivered intravitreally and we will await further resolution post focal laser ablation

## 2019-11-18 ENCOUNTER — Telehealth: Payer: Self-pay

## 2019-11-18 NOTE — Telephone Encounter (Signed)
Pt called and stated she has lost her CDC vaccine information card. Informed pt would send email to [facility]) and send him the information. Pt verbalized understanding.  Address, DOB, phone number verified.  E-mail with pt information sent.

## 2019-11-20 ENCOUNTER — Encounter: Payer: Self-pay | Admitting: Cardiology

## 2019-11-20 ENCOUNTER — Other Ambulatory Visit: Payer: Self-pay

## 2019-11-20 ENCOUNTER — Ambulatory Visit (INDEPENDENT_AMBULATORY_CARE_PROVIDER_SITE_OTHER): Payer: Medicare Other | Admitting: Cardiology

## 2019-11-20 ENCOUNTER — Telehealth: Payer: Self-pay | Admitting: Family Medicine

## 2019-11-20 VITALS — BP 124/80 | HR 84 | Temp 97.4°F | Ht 61.5 in | Wt 124.4 lb

## 2019-11-20 DIAGNOSIS — E119 Type 2 diabetes mellitus without complications: Secondary | ICD-10-CM

## 2019-11-20 DIAGNOSIS — Z7189 Other specified counseling: Secondary | ICD-10-CM | POA: Diagnosis not present

## 2019-11-20 DIAGNOSIS — I10 Essential (primary) hypertension: Secondary | ICD-10-CM | POA: Diagnosis not present

## 2019-11-20 DIAGNOSIS — Z8673 Personal history of transient ischemic attack (TIA), and cerebral infarction without residual deficits: Secondary | ICD-10-CM

## 2019-11-20 NOTE — Telephone Encounter (Signed)
Patient is calling to see if she qualifies to receive the 3rd COVID booster vaccine. She states that her son has down syndrome and will probably get it, but she doesn't know if she qualifies for it. Please give her a call back at [phone] and advise.

## 2019-11-20 NOTE — Telephone Encounter (Signed)
Please advise message below  °

## 2019-11-20 NOTE — Patient Instructions (Signed)

## 2019-11-20 NOTE — Progress Notes (Signed)
Cardiology Office Note:    Date:  11/20/2019   ID:  Marie [name], DOB [birth year], MRN [id number]  PCP:  Libby Maw, MD  Cardiologist:  Buford Dresser, MD  Referring MD: Libby Maw,*   CC: follow up  History of Present Illness:    Marie [name] is a 64 y.o. female with a hx of CVA, hypertension, type II diabetes, dyslipidemia, carotid stenosis who is seen for follow up today. I initially met her 05/01/19 as a new patient to me/prior patient of Dr. Bettina Gavia for the evaluation and management of CVA and hypertension.  She was last seen by Dr. Bettina Gavia on 02/04/2018. She has a history of CVA. Workup unrevealing, loop recorder has not shown atrial fibrillation during the entire life of the device (implanted 2015). Her hypertension was well controlled on ACEi and amlodipine.  Today: Doing well from a heart perspective. Has diabetic eye disease, had to have laser therapy for bleeding, but other that that is doing well.   Again asking about removing loop, reviewing medications. Discussed at length.  Has received both Covid vaccines. Wondering if she needs a booster shot.   Walks, does exercise regularly.  Denies chest pain, shortness of breath at rest or with normal exertion. No PND, orthopnea, LE edema or unexpected weight gain. No syncope or palpitations. Does have occasional orthostatic dizzyness, better with slow position changes.  Past Medical History:  Diagnosis Date  . CVA (cerebral vascular accident) ([place]) 2015  . Depression   . Diabetes ([place])    t2  . Gallstones   . Hyperlipemia   . Hypertension   . Migraines     Past Surgical History:  Procedure Laterality Date  . CESAREAN SECTION     x2  . CHOLECYSTECTOMY N/A 05/25/2016   Procedure: LAPAROSCOPIC CHOLECYSTECTOMY WITH INTRAOPERATIVE CHOLANGIOGRAM;  Surgeon: Johnathan Hausen, MD;  Location: [facility];  Service: General;  Laterality: N/A;  . IR GENERIC HISTORICAL  03/13/2016   IR PERC CHOLECYSTOSTOMY  03/13/2016 Sandi Mariscal, MD [facility]INTERV RAD  . IR GENERIC HISTORICAL  04/28/2016   IR RADIOLOGIST EVAL & MGMT 04/28/2016 Jacqulynn Cadet, MD [facility] INTER[facility] RAD  . LOOP RECORDER IMPLANT N/A 11/29/2013   Procedure: LOOP RECORDER IMPLANT;  Surgeon: Evans Lance, MD;  Location: [facility] CATH LAB;  Service: Cardiovascular;  Laterality: N/A;  . TEE WITHOUT CARDIOVERSION N/A 08/24/2013   Procedure: TRANSESOPHAGEAL ECHOCARDIOGRAM (TEE);  Surgeon: Sanda Klein, MD;  Location: [facility] ENDOSCOPY;  Service: Cardiovascular;  Laterality: N/A;    Current Medications: Current Outpatient Medications on File Prior to Visit  Medication Sig  . amLODipine (NORVASC) 5 MG tablet Take 1 tablet (5 mg total) by mouth daily.  [name] aspirin EC 81 MG tablet Take 81 mg by mouth at bedtime.  [name] atorvastatin (LIPITOR) 40 MG tablet Take 1 tablet (40 mg total) by mouth daily.  . Cholecalciferol (VITAMIN D3) 3000 units TABS Take 3,000 Units by mouth at bedtime.  . Coenzyme Q10 (COQ10) 200 MG CAPS Take 200 mg by mouth at bedtime.   . Continuous Blood Gluc Sensor (FREESTYLE LIBRE 14 DAY SENSOR) MISC 1 each by Other route every 14 (fourteen) days. WILL NOT COMPLETE PA. PATIENT WILL NOT BE USING INSURANCE. SELF PAY  . dapagliflozin propanediol (FARXIGA) 10 MG TABS tablet Take 10 mg by mouth daily.  . Ginkgo Biloba 500 MG CAPS Take 500 mg by mouth at bedtime.   [name] glucose blood (ONETOUCH VERIO) test strip 1 each by Other route as needed for other. Use as  instructed  . lisinopril (ZESTRIL) 40 MG tablet Take 1 tablet (40 mg total) by mouth at bedtime.  . metFORMIN (GLUCOPHAGE-XR) 500 MG 24 hr tablet Take 2 tablets (1,000 mg total) by mouth in the morning and at bedtime.  . Multiple Vitamin (MULTIVITAMIN WITH MINERALS) TABS tablet Take 1 tablet by mouth daily.  . RYBELSUS 7 MG TABS Take 1 tablet by mouth daily.   No current facility-administered medications on file prior to visit.     Allergies:   Penicillins and Other   Social History   Tobacco  Use  . Smoking status: Never Smoker  . Smokeless tobacco: Never Used  Vaping Use  . Vaping Use: Never used  Substance Use Topics  . Alcohol use: No    Alcohol/week: 1.0 standard drink    Types: 1 Standard drinks or equivalent per week    Comment: Social  . Drug use: No    Family History: family history includes Diabetes in her maternal grandfather and mother; High blood pressure in her maternal grandfather and mother; Stomach cancer in her mother.  ROS:   Please see the history of present illness.  Additional pertinent ROS otherwise unremarkable.   EKGs/Labs/Other Studies Reviewed:    The following studies were reviewed today: TEE 09-01-13 - Left ventricle: Systolic function was normal. Wall motion was  normal; there were no regional wall motion abnormalities.  - Left atrium: No evidence of thrombus in the atrial cavity or  appendage.  - Right atrium: No evidence of thrombus in the atrial cavity or  appendage.  - Atrial septum: No defect or patent foramen ovale was identified.  Echo contrast study showed no right-to-left atrial level shunt,  at baseline or with provocation. Echo contrast study showed no  right-to-left shunt, following an increase in RA pressure induced  by provocative maneuvers.    EKG:  EKG is personally reviewed.  The ekg ordered 05/01/19 demonstrates sinus rhythm with sinus arrhythmia  Recent Labs: 10/27/2019: ALT 26; BUN 11; Creatinine, Ser 0.70; Hemoglobin 13.6; Platelets 220.0; Potassium 3.9; Sodium 139  Recent Lipid Panel    Component Value Date/Time   CHOL 150 10/27/2019 0759   TRIG 141.0 10/27/2019 0759   HDL 45.60 10/27/2019 0759   CHOLHDL 3 10/27/2019 0759   VLDL 28.2 10/27/2019 0759   LDLCALC 76 10/27/2019 0759    Physical Exam:    VS:  BP 124/80   Pulse 84   Temp (!) 97.4 F (36.3 C)   Ht 5' 1.5" (1.562 m)   Wt 124 lb 6.4 oz (56.4 kg)   SpO2 97%   BMI 23.12 kg/m     Wt Readings from Last 3 Encounters:  11/20/19  124 lb 6.4 oz (56.4 kg)  10/26/19 124 lb (56.2 kg)  09/26/19 126 lb 3.2 oz (57.2 kg)    GEN: Well nourished, well developed in no acute distress HEENT: Normal, moist mucous membranes NECK: No JVD CARDIAC: regular rhythm, normal S1 and S2, no rubs or gallops. No murmur. VASCULAR: Radial and DP pulses 2+ bilaterally. No carotid bruits RESPIRATORY:  Clear to auscultation without rales, wheezing or rhonchi  ABDOMEN: Soft, non-tender, non-distended MUSCULOSKELETAL:  Ambulates independently SKIN: Warm and dry, no edema NEUROLOGIC:  Alert and oriented x 3. No focal neuro deficits noted. PSYCHIATRIC:  Normal affect   ASSESSMENT:    1. History of CVA (cerebrovascular accident)   2. Essential hypertension   3. Type 2 diabetes mellitus without obesity ([place])   4. Cardiac risk counseling  5. Counseling on health promotion and disease prevention    PLAN:    History of CVA: no further events -we discussed the loop recorder again. I recommend leaving it in place unless it bothers her, which it currently does not -no further strokes -no detection of afib on loop -continue aspirin, atorvastatin  Hypertension: goal <130/80, at goal today -continue amlodipine 5 mg, lisinopril 40 mg daily  Type II diabetes, without obesity, with history of ASCVD: -she is already on both SGLT2i and GLP1RA  Cardiac risk counseling and secondary prevention recommendations: -recommend heart healthy/Mediterranean diet, with whole grains, fruits, vegetable, fish, lean meats, nuts, and olive oil. Limit salt. -recommend moderate walking, 3-5 times/week for 30-50 minutes each session. Aim for at least 150 minutes.week. Goal should be pace of 3 miles/hours, or walking 1.5 miles in 30 minutes -recommend avoidance of tobacco products. Avoid excess alcohol.  Plan for follow up: 1 year or sooner PRN  Buford Dresser, MD, PhD [place]  [facility]    Medication Adjustments/Labs and Tests Ordered: Current  medicines are reviewed at length with the patient today.  Concerns regarding medicines are outlined above.  No orders of the defined types were placed in this encounter.  No orders of the defined types were placed in this encounter.   Patient Instructions  Medication Instructions:  Your Physician recommend you continue on your current medication as directed.    *If you need a refill on your cardiac medications before your next appointment, please call your pharmacy*   Lab Work: None  Testing/Procedures: None   Follow-Up: At [facility], you and your health needs are our priority.  As part of our continuing mission to provide you with exceptional heart care, we have created designated Provider Care Teams.  These Care Teams include your primary Cardiologist (physician) and Advanced Practice Providers (APPs -  Physician Assistants and Nurse Practitioners) who all work together to provide you with the care you need, when you need it.  We recommend signing up for the patient portal called "MyChart".  Sign up information is provided on this After Visit Summary.  MyChart is used to connect with patients for Virtual Visits (Telemedicine).  Patients are able to view lab/test results, encounter notes, upcoming appointments, etc.  Non-urgent messages can be sent to your provider as well.   To learn more about what you can do with MyChart, go to [email or web address].    Your next appointment:   1 year(s)  The format for your next appointment:   In Person  Provider:   Buford Dresser, MD      Signed, Buford Dresser, MD PhD 11/20/2019  [place]

## 2019-12-18 ENCOUNTER — Ambulatory Visit: Payer: Self-pay | Admitting: "Endocrinology

## 2019-12-21 ENCOUNTER — Telehealth: Payer: Self-pay | Admitting: Family Medicine

## 2019-12-21 NOTE — Telephone Encounter (Signed)
Patient called back and had questions about which dosage of the flu shot should she take and also if she should receive the Covid Vaccine Booster injection please advise. CB is [phone]

## 2019-12-22 NOTE — Telephone Encounter (Signed)
Returned patients call no answer, unable to leave message will call back.

## 2019-12-25 ENCOUNTER — Telehealth: Payer: Self-pay | Admitting: Family Medicine

## 2019-12-25 DIAGNOSIS — Z1211 Encounter for screening for malignant neoplasm of colon: Secondary | ICD-10-CM

## 2019-12-25 NOTE — Telephone Encounter (Signed)
Patient states she got an automated call asking if she had a colonoscopy. She would like a call back to see if she needs a colonoscopy.

## 2019-12-27 ENCOUNTER — Encounter: Payer: Self-pay | Admitting: Family Medicine

## 2019-12-27 ENCOUNTER — Other Ambulatory Visit: Payer: Self-pay

## 2019-12-27 ENCOUNTER — Ambulatory Visit (INDEPENDENT_AMBULATORY_CARE_PROVIDER_SITE_OTHER): Payer: Medicare Other | Admitting: Family Medicine

## 2019-12-27 VITALS — BP 124/76 | HR 90 | Temp 98.1°F | Ht 61.5 in | Wt 122.6 lb

## 2019-12-27 DIAGNOSIS — S90852A Superficial foreign body, left foot, initial encounter: Secondary | ICD-10-CM

## 2019-12-27 DIAGNOSIS — Z23 Encounter for immunization: Secondary | ICD-10-CM

## 2019-12-27 NOTE — Progress Notes (Signed)
Marie [name] is a 64 y.o. female  Chief Complaint  Patient presents with  . Acute Visit    LT foot pain x 4-5 day.  she was working in the yard barefoot. Also wants a referral to an Endocrinologists    HPI: Marie [name] is a 64 y.o. female patient of my colleague Dr. Doreene Burke who complains of Lt foot pain x 4-5 days. Pt was working in her yard barefoot and believes she stepped on something. Nothing visible. No redness. Pt notes pain when she walks or applies pressure to anterolateral plantar aspect of her foot.  Last tetanus - unsure " years"  Pt also states PCP previously referred her to endocrinology Dr. Lucianne Muss for her DM but she was never contacted. On review of pts chart, referral was placed in 09/2019, pt was called x2 by endo office, and then letter was sent. Printed a copy of letter for pt and she will call endo office.   Lab Results  Component Value Date   HGBA1C 7.2 (H) 10/27/2019     Past Medical History:  Diagnosis Date  . CVA (cerebral vascular accident) ([facility]) 2015  . Depression   . Diabetes ([facility])    t2  . Gallstones   . Hyperlipemia   . Hypertension   . Migraines     Past Surgical History:  Procedure Laterality Date  . CESAREAN SECTION     x2  . CHOLECYSTECTOMY N/A 05/25/2016   Procedure: LAPAROSCOPIC CHOLECYSTECTOMY WITH INTRAOPERATIVE CHOLANGIOGRAM;  Surgeon: Luretha Murphy, MD;  Location: [facility];  Service: General;  Laterality: N/A;  . IR GENERIC HISTORICAL  03/13/2016   IR PERC CHOLECYSTOSTOMY 03/13/2016 [name], MD [facility]INTER[facility]  . IR GENERIC HISTORICAL  04/28/2016   IR RADIOLOGIST EVAL & MGMT 04/28/2016 [name], MD [facility] INTER[facility] RAD  . LOOP RECORDER IMPLANT N/A 11/29/2013   Procedure: LOOP RECORDER IMPLANT;  Surgeon: Marinus Maw, MD;  Location[facility] CATH LAB;  Service: Cardiovascular;  Laterality: N/A;  . TEE WITHOUT CARDIOVERSION N/A 08/24/2013   Procedure: TRANSESOPHAGEAL ECHOCARDIOGRAM (TEE);  Surgeon: [name], MD;  Location: [facility] ENDOSCOPY;   Service: Cardiovascular;  Laterality: N/A;    Social History   Socioeconomic History  . Marital status: Married    Spouse name: Not on file  . Number of children: 3  . Years of education: MBA  . Highest education level: Not on file  Occupational History    Employer: [employer]    Comment: [employer]  Tobacco Use  . Smoking status: Never Smoker  . Smokeless tobacco: Never Used  Vaping Use  . Vaping Use: Never used  Substance and Sexual Activity  . Alcohol use: No    Alcohol/week: 1.0 standard drink    Types: 1 Standard drinks or equivalent per week    Comment: Social  . Drug use: No  . Sexual activity: Not on file  Other Topics Concern  . Not on file  Social History Narrative   Patient lives at home with [name].   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         Social Determinants of Health   Financial Resource Strain:   . Difficulty of Paying Living Expenses: Not on file  Food Insecurity:   . Worried About Programme researcher, broadcasting/film/video in the Last Year: Not on file  . Ran Out of Food in the Last  Year: Not on file  Transportation Needs:   . Lack of Transportation (Medical): Not on file  . Lack of Transportation (Non-Medical): Not on file  Physical Activity:   . Days of Exercise per Week: Not on file  . Minutes of Exercise per Session: Not on file  Stress:   . Feeling of Stress : Not on file  Social Connections:   . Frequency of Communication with Friends and Family: Not on file  . Frequency of Social Gatherings with Friends and Family: Not on file  . Attends Religious Services: Not on file  . Active Member of Clubs or Organizations: Not on file  . Attends Banker Meetings: Not on file  . Marital Status: Not on file  Intimate Partner Violence:   . Fear of Current or Ex-Partner: Not on file  . Emotionally Abused: Not on file  . Physically Abused: Not on file  .  Sexually Abused: Not on file    Family History  Problem Relation Age of Onset  . Stomach cancer Mother   . High blood pressure Mother   . Diabetes Mother   . Diabetes Maternal Grandfather   . High blood pressure Maternal Grandfather      Immunization History  Administered Date(s) Administered  . Influenza,inj,Quad PF,6+ Mos 02/05/2018, 11/23/2018  . PFIZER SARS-COV-2 Vaccination 06/01/2019, 06/23/2019  . Zoster Recombinat (Shingrix) 07/20/2017, 01/06/2018    Outpatient Encounter Medications as of 12/27/2019  Medication Sig  . amLODipine (NORVASC) 5 MG tablet Take 1 tablet (5 mg total) by mouth daily.  [name] aspirin EC 81 MG tablet Take 81 mg by mouth at bedtime.  [name] atorvastatin (LIPITOR) 40 MG tablet Take 1 tablet (40 mg total) by mouth daily.  . Cholecalciferol (VITAMIN D3) 3000 units TABS Take 3,000 Units by mouth at bedtime.  . Coenzyme Q10 (COQ10) 200 MG CAPS Take 200 mg by mouth at bedtime.   . Continuous Blood Gluc Sensor (FREESTYLE LIBRE 14 DAY SENSOR) MISC 1 each by Other route every 14 (fourteen) days. WILL NOT COMPLETE PA. PATIENT WILL NOT BE USING INSURANCE. SELF PAY  . dapagliflozin propanediol (FARXIGA) 10 MG TABS tablet Take 10 mg by mouth daily.  . Ginkgo Biloba 500 MG CAPS Take 500 mg by mouth at bedtime.   [name] glucose blood (ONETOUCH VERIO) test strip 1 each by Other route as needed for other. Use as instructed  . lisinopril (ZESTRIL) 40 MG tablet Take 1 tablet (40 mg total) by mouth at bedtime.  . metFORMIN (GLUCOPHAGE-XR) 500 MG 24 hr tablet Take 2 tablets (1,000 mg total) by mouth in the morning and at bedtime.  . Multiple Vitamin (MULTIVITAMIN WITH MINERALS) TABS tablet Take 1 tablet by mouth daily.  . RYBELSUS 7 MG TABS Take 1 tablet by mouth daily.   No facility-administered encounter medications on file as of 12/27/2019.     ROS: Pertinent positives and negatives noted in HPI. Remainder of ROS non-contributory    Allergies  Allergen Reactions  .  Penicillins Other (See Comments)    Driving couldn't see and head felt heavy  Has patient had a PCN reaction causing immediate rash, facial/tongue/throat swelling, SOB or lightheadedness with hypotension: No Has patient had a PCN reaction causing severe rash involving mucus membranes or skin necrosis: No Has patient had a PCN reaction that required hospitalization: No Has patient had a PCN reaction occurring within the last 10 years: No If all of the above answers are "NO", then may proceed with Cephalosporin use.   [name]  Other Other (See Comments)    Allergic to beef and eggplant causes rash on skin    BP 124/76   Pulse 90   Temp 98.1 F (36.7 C) (Temporal)   Ht 5' 1.5" (1.562 m)   Wt 122 lb 9.6 oz (55.6 kg)   SpO2 97%   BMI 22.79 kg/m   Physical Exam Vitals reviewed.  Constitutional:      General: She is not in acute distress.    Appearance: Normal appearance. She is not diaphoretic.  Musculoskeletal:       Feet:  Skin:    General: Skin is warm and dry.  Neurological:     General: No focal deficit present.     Mental Status: She is alert and oriented to person, place, and time.  Psychiatric:        Mood and Affect: Mood normal.        Behavior: Behavior normal.      A/P:  1. Foreign body in left foot, initial encounter - no visible or palpable FB but pt with TTP over discrete area of plantar surface of foot - dead/dried skin and callus removed from area - recommend BID warm water soaks x 4-5 days and if pain does not resolve, she is to f/u with PCP - Tdap vaccine greater than or equal to 7yo IM  2. Need for tetanus, diphtheria, and acellular pertussis (Tdap) vaccine - Tdap vaccine greater than or equal to 7yo IM    This visit occurred during the SARS-CoV-2 public health emergency.  Safety protocols were in place, including screening questions prior to the visit, additional usage of staff PPE, and extensive cleaning of exam room while observing appropriate contact  time as indicated for disinfecting solutions.

## 2019-12-27 NOTE — Telephone Encounter (Signed)
Spoke with patient who states that she have not had a colonoscopy and would like a referral for one. Next appointment for follow up visit 01/26/20

## 2019-12-27 NOTE — Patient Instructions (Signed)
Soak your foot in warm water (can add epsom salt) 2x/day for 15-20 min each time Can file down area of callus after soaking If no/minimal improvement in 5-7 days, please call the office

## 2020-01-04 ENCOUNTER — Encounter (INDEPENDENT_AMBULATORY_CARE_PROVIDER_SITE_OTHER): Payer: Medicare Other | Admitting: Ophthalmology

## 2020-01-08 ENCOUNTER — Other Ambulatory Visit: Payer: Self-pay

## 2020-01-08 ENCOUNTER — Encounter (INDEPENDENT_AMBULATORY_CARE_PROVIDER_SITE_OTHER): Payer: Self-pay | Admitting: Ophthalmology

## 2020-01-08 ENCOUNTER — Ambulatory Visit (INDEPENDENT_AMBULATORY_CARE_PROVIDER_SITE_OTHER): Payer: Medicare Other | Admitting: Ophthalmology

## 2020-01-08 DIAGNOSIS — H35021 Exudative retinopathy, right eye: Secondary | ICD-10-CM

## 2020-01-08 DIAGNOSIS — E113411 Type 2 diabetes mellitus with severe nonproliferative diabetic retinopathy with macular edema, right eye: Secondary | ICD-10-CM | POA: Diagnosis not present

## 2020-01-08 MED ORDER — DEXAMETHASONE 0.7 MG IZ IMPL
0.7000 mg | DRUG_IMPLANT | INTRAVITREAL | Status: AC | PRN
  Administered 2020-01-08: .7 mg via INTRAVITREAL

## 2020-01-08 NOTE — Patient Instructions (Signed)
Patient asked to report onset visual acuity difficulties or changes.  I explained to the patient that the implant is like the size of a rice grain and she may in fact see a linear or cylindrical floater in the coming days

## 2020-01-08 NOTE — Progress Notes (Signed)
01/08/2020     CHIEF COMPLAINT Patient presents for Retina Follow Up   HISTORY OF PRESENT ILLNESS: Marie [name] is a 64 y.o. female who presents to the clinic today for:   HPI    Retina Follow Up    Patient presents with  Diabetic Retinopathy.  In right eye.  This started 8 weeks ago.  Severity is moderate.  Duration of 8 weeks.  Since onset it is stable.          Comments    8 Week Diabetic F/U OD, possible Ozurdex OD  Pt denies noticeable changes to [place] OU since last visit. Pt denies ocular pain, flashes of light, or floaters OU. Pt c/o fogginess OD, and sts she feels like she has to rub her eye to clear it. Pt sts fogginess comes and goes. LBS: unknown       Last edited by Edmon Crape, MD on 01/08/2020  2:17 PM. (History)      Referring physician: Mliss Sax, MD [address] [place],  [place] [address]  HISTORICAL INFORMATION:   Selected notes from the MEDICAL RECORD NUMBER    Lab Results  Component Value Date   HGBA1C 7.2 (H) 10/27/2019     CURRENT MEDICATIONS: No current outpatient medications on file. (Ophthalmic Drugs)   No current facility-administered medications for this visit. (Ophthalmic Drugs)   Current Outpatient Medications (Other)  Medication Sig  . amLODipine (NORVASC) 5 MG tablet Take 1 tablet (5 mg total) by mouth daily.  [name] aspirin EC 81 MG tablet Take 81 mg by mouth at bedtime.  [name] atorvastatin (LIPITOR) 40 MG tablet Take 1 tablet (40 mg total) by mouth daily.  . Cholecalciferol (VITAMIN D3) 3000 units TABS Take 3,000 Units by mouth at bedtime.  . Coenzyme Q10 (COQ10) 200 MG CAPS Take 200 mg by mouth at bedtime.   . Continuous Blood Gluc Sensor (FREESTYLE LIBRE 14 DAY SENSOR) MISC 1 each by Other route every 14 (fourteen) days. WILL NOT COMPLETE PA. PATIENT WILL NOT BE USING INSURANCE. SELF PAY  . dapagliflozin propanediol (FARXIGA) 10 MG TABS tablet Take 10 mg by mouth daily.  . Ginkgo Biloba 500 MG CAPS Take 500 mg by  mouth at bedtime.   [name] glucose blood (ONETOUCH VERIO) test strip 1 each by Other route as needed for other. Use as instructed  . lisinopril (ZESTRIL) 40 MG tablet Take 1 tablet (40 mg total) by mouth at bedtime.  . metFORMIN (GLUCOPHAGE-XR) 500 MG 24 hr tablet Take 2 tablets (1,000 mg total) by mouth in the morning and at bedtime.  . Multiple Vitamin (MULTIVITAMIN WITH MINERALS) TABS tablet Take 1 tablet by mouth daily.  . RYBELSUS 7 MG TABS Take 1 tablet by mouth daily.   No current facility-administered medications for this visit. (Other)      REVIEW OF SYSTEMS:    ALLERGIES Allergies  Allergen Reactions  . Penicillins Other (See Comments)    Driving couldn't see and head felt heavy  Has patient had a PCN reaction causing immediate rash, facial/tongue/throat swelling, SOB or lightheadedness with hypotension: No Has patient had a PCN reaction causing severe rash involving mucus membranes or skin necrosis: No Has patient had a PCN reaction that required hospitalization: No Has patient had a PCN reaction occurring within the last 10 years: No If all of the above answers are "NO", then may proceed with Cephalosporin use.   . Other Other (See Comments)    Allergic to beef and  eggplant causes rash on skin    PAST MEDICAL HISTORY Past Medical History:  Diagnosis Date  . CVA (cerebral vascular accident) (HCC) 2015  . Depression   . Diabetes (HCC)    t2  . Gallstones   . Hyperlipemia   . Hypertension   . Migraines    Past Surgical History:  Procedure Laterality Date  . CESAREAN SECTION     x2  . CHOLECYSTECTOMY N/A 05/25/2016   Procedure: LAPAROSCOPIC CHOLECYSTECTOMY WITH INTRAOPERATIVE CHOLANGIOGRAM;  Surgeon: Luretha Murphy, MD;  Location: [facility];  Service: General;  Laterality: N/A;  . IR GENERIC HISTORICAL  03/13/2016   IR PERC CHOLECYSTOSTOMY 03/13/2016 [name], MD [facility]INTER[facility] RAD  . IR GENERIC HISTORICAL  04/28/2016   IR RADIOLOGIST EVAL & MGMT 04/28/2016 [name], MD [facility] INTERV RAD  . LOOP RECORDER IMPLANT N/A 11/29/2013   Procedure: LOOP RECORDER IMPLANT;  Surgeon: Marinus Maw, MD;  Location[facility] CATH LAB;  Service: Cardiovascular;  Laterality: N/A;  . TEE WITHOUT CARDIOVERSION N/A 08/24/2013   Procedure: TRANSESOPHAGEAL ECHOCARDIOGRAM (TEE);  Surgeon: [name], MD;  Location: [facility] ENDOSCOPY;  Service: Cardiovascular;  Laterality: N/A;    FAMILY HISTORY Family History  Problem Relation Age of Onset  . Stomach cancer Mother   . High blood pressure Mother   . Diabetes Mother   . Diabetes Maternal Grandfather   . High blood pressure Maternal Grandfather     SOCIAL HISTORY Social History   Tobacco Use  . Smoking status: Never Smoker  . Smokeless tobacco: Never Used  Vaping Use  . Vaping Use: Never used  Substance Use Topics  . Alcohol use: No    Alcohol/week: 1.0 standard drink    Types: 1 Standard drinks or equivalent per week    Comment: Social  . Drug use: No         OPHTHALMIC EXAM:  Base Eye Exam    Visual Acuity (ETDRS)      Right Left   Dist [place] CF at 3' 20/20   Dist ph [place] NI        Tonometry (Tonopen, 1:22 PM)      Right Left   Pressure 23 22       Pupils      Pupils Dark Light Shape React APD   Right PERRL 5 4 Round Brisk None   Left PERRL 5 4 Round Brisk None       Visual Fields (Counting fingers)      Left Right    Full Full       Extraocular Movement      Right Left    Full Full       Neuro/Psych    Oriented x3: Yes   Mood/Affect: Normal       Dilation    Right eye: 1.0% Mydriacyl, 2.5% Phenylephrine @ 1:22 PM        Slit Lamp and Fundus Exam    External Exam      Right Left   External Normal Normal       Slit Lamp Exam      Right Left   Lids/Lashes Normal Normal   Conjunctiva/Sclera White and quiet White and quiet   Cornea Clear Clear   Anterior Chamber Deep and quiet Deep and quiet   Iris Round and reactive Round and reactive   Lens Posterior chamber  intraocular lens Clear   Anterior Vitreous Normal Normal       Fundus Exam  Right Left   Posterior Vitreous Posterior vitreous detachment    Disc Normal    C/D Ratio 0.5    Macula Focal laser scars,   Macular thickening, Microaneurysms, Clinically significant macular edema, Improved  clinically significant macular edema    Vessels Retinopathy,, severe    Periphery Normal           IMAGING AND PROCEDURES  Imaging and Procedures for 01/08/20  OCT, Retina - OU - Both Eyes       Right Eye Quality was good. Scan locations included subfoveal. Central Foveal Thickness: 647. Findings include cystoid macular edema.   Left Eye Quality was good. Scan locations included subfoveal. Central Foveal Thickness: 274. Findings include normal foveal contour.   Notes CSME, and adult Coats disease with CME OD each resistant to prior antivegF, multiple medications  Will attempt to use intravitreal steroid, Ozurdex today and follow-up for potential PRP in the future.       Intravitreal Injection, Pharmacologic Agent - OD - Right Eye       Time Out 01/08/2020. 2:20 PM. Confirmed correct patient, procedure, site, and patient consented.   Anesthesia Topical anesthesia was used. Anesthetic medications included [place] 3.5%.   Procedure Preparation included 10% betadine to eyelids, Tobramycin 0.3%, Ofloxacin , 5% betadine to ocular surface. A 30 gauge needle was used.   Injection:  0.7 mg dexamethasone (OZURDEX) 0.7 MG intravitreal implant   NDC: 5809-9833-82, Lot: [id number]   Route: Intravitreal, Site: Right Eye, Waste: 0 mg  Post-op Post injection exam found visual acuity of at least counting fingers. The patient tolerated the procedure well. There were no complications. The patient received written and verbal post procedure care education. Post injection medications were not given.                 ASSESSMENT/PLAN:  No problem-specific Assessment & Plan notes found for this  encounter.      ICD-10-CM   1. Severe nonproliferative diabetic retinopathy of right eye, with macular edema, associated with type 2 diabetes mellitus (HCC)  E11.3411 OCT, Retina - OU - Both Eyes    Intravitreal Injection, Pharmacologic Agent - OD - Right Eye    dexamethasone (OZURDEX) 0.7 MG intravitreal implant 0.7 mg  2. Coat's disease, right  H35.021 OCT, Retina - OU - Both Eyes    1.  We will repeat therapeutic intervention for CSME resistant to antivegF.  Commence with first injection of Ozurdex OD today.  If CSME improves may consider full PRP of the right eye to decrease vegF burden 2.  3.  Ophthalmic Meds Ordered this visit:  Meds ordered this encounter  Medications  . dexamethasone (OZURDEX) 0.7 MG intravitreal implant 0.7 mg       Return in about 4 weeks (around 02/05/2020) for dilate, COLOR FP, OCT, PRP, OD.  Patient Instructions  Patient asked to report onset visual acuity difficulties or changes.  I explained to the patient that the implant is like the size of a rice grain and she may in fact see a linear or cylindrical floater in the coming days    Explained the diagnoses, plan, and follow up with the patient and they expressed understanding.  Patient expressed understanding of the importance of proper follow up care.   Alford Highland Kyston Gonce M.D. Diseases & Surgery of the Retina and Vitreous [facility] 01/08/20     Abbreviations: M myopia (nearsighted); A astigmatism; H hyperopia (farsighted); P presbyopia; Mrx spectacle prescription;  CTL contact lenses;  OD right eye; OS left eye; OU both eyes  XT exotropia; ET esotropia; PEK punctate epithelial keratitis; PEE punctate epithelial erosions; DES dry eye syndrome; MGD meibomian gland dysfunction; ATs artificial tears; PFAT's preservative free artificial tears; NSC nuclear sclerotic cataract; PSC posterior subcapsular cataract; ERM epi-retinal membrane; PVD posterior vitreous detachment; RD retinal  detachment; DM diabetes mellitus; DR diabetic retinopathy; NPDR non-proliferative diabetic retinopathy; PDR proliferative diabetic retinopathy; CSME clinically significant macular edema; DME diabetic macular edema; dbh dot blot hemorrhages; CWS cotton wool spot; POAG primary open angle glaucoma; C/D cup-to-disc ratio; HVF humphrey visual field; GVF goldmann visual field; OCT optical coherence tomography; IOP intraocular pressure; BRVO Branch retinal vein occlusion; CRVO central retinal vein occlusion; CRAO central retinal artery occlusion; BRAO branch retinal artery occlusion; RT retinal tear; SB scleral buckle; PPV pars plana vitrectomy; VH Vitreous hemorrhage; PRP panretinal laser photocoagulation; IVK intravitreal kenalog; VMT vitreomacular traction; MH Macular hole;  NVD neovascularization of the disc; NVE neovascularization elsewhere; AREDS age related eye disease study; ARMD age related macular degeneration; POAG primary open angle glaucoma; EBMD epithelial/anterior basement membrane dystrophy; ACIOL anterior chamber intraocular lens; IOL intraocular lens; PCIOL posterior chamber intraocular lens; Phaco/IOL phacoemulsification with intraocular lens placement; PRK photorefractive keratectomy; LASIK laser assisted in situ keratomileusis; HTN hypertension; DM diabetes mellitus; COPD chronic obstructive pulmonary disease

## 2020-01-15 ENCOUNTER — Encounter: Payer: Self-pay | Admitting: Cardiology

## 2020-01-16 ENCOUNTER — Ambulatory Visit: Payer: Medicare Other | Admitting: Endocrinology

## 2020-01-16 ENCOUNTER — Other Ambulatory Visit: Payer: Medicare Other

## 2020-01-20 ENCOUNTER — Other Ambulatory Visit: Payer: Self-pay | Admitting: Endocrinology

## 2020-01-20 DIAGNOSIS — E11319 Type 2 diabetes mellitus with unspecified diabetic retinopathy without macular edema: Secondary | ICD-10-CM

## 2020-01-26 ENCOUNTER — Ambulatory Visit: Payer: Medicare Other | Admitting: Family Medicine

## 2020-01-29 ENCOUNTER — Ambulatory Visit: Payer: Medicare Other | Admitting: Endocrinology

## 2020-02-05 ENCOUNTER — Ambulatory Visit (INDEPENDENT_AMBULATORY_CARE_PROVIDER_SITE_OTHER): Payer: Medicare Other | Admitting: Ophthalmology

## 2020-02-05 ENCOUNTER — Encounter (INDEPENDENT_AMBULATORY_CARE_PROVIDER_SITE_OTHER): Payer: Self-pay | Admitting: Ophthalmology

## 2020-02-05 ENCOUNTER — Other Ambulatory Visit: Payer: Self-pay

## 2020-02-05 DIAGNOSIS — E113411 Type 2 diabetes mellitus with severe nonproliferative diabetic retinopathy with macular edema, right eye: Secondary | ICD-10-CM | POA: Diagnosis not present

## 2020-02-05 NOTE — Progress Notes (Signed)
02/05/2020     CHIEF COMPLAINT Patient presents for Retina Follow Up   HISTORY OF PRESENT ILLNESS: Marie [name] is a 63 y.o. female who presents to the clinic today for:   HPI    Retina Follow Up    Patient presents with  Diabetic Retinopathy.  In right eye.  Severity is severe.  Duration of 4 weeks.  Since onset it is stable.  I, the attending physician,  performed the HPI with the patient and updated documentation appropriately.          Comments    4 Week NPDR f\u OD. OCT and FP. PRP OD  Pt states vision has slightly improved in OD. Pt states she can see shapes but vision is not crisp. Pt states after reading on her phone, she has to wipe OD so that vision becomes clear again. Pt still sees Ozurdex implant in OD vision. BGL: did not check       Last edited by Elyse Jarvis on 02/05/2020  1:50 PM. (History)      Referring physician: Mliss Sax, MD [address] [place],  [place] [address]  HISTORICAL INFORMATION:   Selected notes from the MEDICAL RECORD NUMBER    Lab Results  Component Value Date   HGBA1C 7.2 (H) 10/27/2019     CURRENT MEDICATIONS: No current outpatient medications on file. (Ophthalmic Drugs)   No current facility-administered medications for this visit. (Ophthalmic Drugs)   Current Outpatient Medications (Other)  Medication Sig  . amLODipine (NORVASC) 5 MG tablet Take 1 tablet (5 mg total) by mouth daily.  [name] aspirin EC 81 MG tablet Take 81 mg by mouth at bedtime.  [name] atorvastatin (LIPITOR) 40 MG tablet Take 1 tablet (40 mg total) by mouth daily.  . Cholecalciferol (VITAMIN D3) 3000 units TABS Take 3,000 Units by mouth at bedtime.  . Coenzyme Q10 (COQ10) 200 MG CAPS Take 200 mg by mouth at bedtime.   . Continuous Blood Gluc Sensor (FREESTYLE LIBRE 14 DAY SENSOR) MISC 1 each by Other route every 14 (fourteen) days. WILL NOT COMPLETE PA. PATIENT WILL NOT BE USING INSURANCE. SELF PAY  . dapagliflozin propanediol (FARXIGA) 10  MG TABS tablet Take 10 mg by mouth daily.  . Ginkgo Biloba 500 MG CAPS Take 500 mg by mouth at bedtime.   [name] glucose blood (ONETOUCH VERIO) test strip 1 each by Other route as needed for other. Use as instructed  . lisinopril (ZESTRIL) 40 MG tablet Take 1 tablet (40 mg total) by mouth at bedtime.  . metFORMIN (GLUCOPHAGE-XR) 500 MG 24 hr tablet Take 2 tablets (1,000 mg total) by mouth in the morning and at bedtime.  . Multiple Vitamin (MULTIVITAMIN WITH MINERALS) TABS tablet Take 1 tablet by mouth daily.  . RYBELSUS 7 MG TABS Take 1 tablet by mouth daily.   No current facility-administered medications for this visit. (Other)      REVIEW OF SYSTEMS: ROS    Positive for: Endocrine   Last edited by Elyse Jarvis on 02/05/2020  1:43 PM. (History)       ALLERGIES Allergies  Allergen Reactions  . Penicillins Other (See Comments)    Driving couldn't see and head felt heavy  Has patient had a PCN reaction causing immediate rash, facial/tongue/throat swelling, SOB or lightheadedness with hypotension: No Has patient had a PCN reaction causing severe rash involving mucus membranes or skin necrosis: No Has patient had a PCN reaction that required hospitalization: No Has patient had  a PCN reaction occurring within the last 10 years: No If all of the above answers are "NO", then may proceed with Cephalosporin use.   . Other Other (See Comments)    Allergic to beef and eggplant causes rash on skin    PAST MEDICAL HISTORY Past Medical History:  Diagnosis Date  . CVA (cerebral vascular accident) (HCC) 2015  . Depression   . Diabetes (HCC)    t2  . Gallstones   . Hyperlipemia   . Hypertension   . Migraines    Past Surgical History:  Procedure Laterality Date  . CESAREAN SECTION     x2  . CHOLECYSTECTOMY N/A 05/25/2016   Procedure: LAPAROSCOPIC CHOLECYSTECTOMY WITH INTRAOPERATIVE CHOLANGIOGRAM;  Surgeon: Luretha Murphy, MD;  Location: [facility];  Service: General;  Laterality: N/A;    . IR GENERIC HISTORICAL  03/13/2016   IR PERC CHOLECYSTOSTOMY 03/13/2016 [name], MD [facility]INTERV RAD  . IR GENERIC HISTORICAL  04/28/2016   IR RADIOLOGIST EVAL & MGMT 04/28/2016 [name], MD [facility] INTER[facility] RAD  . LOOP RECORDER IMPLANT N/A 11/29/2013   Procedure: LOOP RECORDER IMPLANT;  Surgeon: Marinus Maw, MD;  Location: [facility] CATH LAB;  Service: Cardiovascular;  Laterality: N/A;  . TEE WITHOUT CARDIOVERSION N/A 08/24/2013   Procedure: TRANSESOPHAGEAL ECHOCARDIOGRAM (TEE);  Surgeon: [name], MD;  Location: [facility] ENDOSCOPY;  Service: Cardiovascular;  Laterality: N/A;    FAMILY HISTORY Family History  Problem Relation Age of Onset  . Stomach cancer Mother   . High blood pressure Mother   . Diabetes Mother   . Diabetes Maternal Grandfather   . High blood pressure Maternal Grandfather     SOCIAL HISTORY Social History   Tobacco Use  . Smoking status: Never Smoker  . Smokeless tobacco: Never Used  Vaping Use  . Vaping Use: Never used  Substance Use Topics  . Alcohol use: No    Alcohol/week: 1.0 standard drink    Types: 1 Standard drinks or equivalent per week    Comment: Social  . Drug use: No         OPHTHALMIC EXAM:  Base Eye Exam    Visual Acuity (Snellen - Linear)      Right Left   Dist [place] CF @ 4' 20/20 -1   Dist ph [place] NI        Tonometry (Tonopen, 1:50 PM)      Right Left   Pressure 15 13       Pupils      Pupils Dark Light Shape React APD   Right PERRL 5 4 Round Brisk None   Left PERRL 5 4 Round Brisk None       Neuro/Psych    Oriented x3: Yes   Mood/Affect: Normal       Dilation    Right eye: 1.0% Mydriacyl, 2.5% Phenylephrine @ 1:50 PM        Slit Lamp and Fundus Exam    External Exam      Right Left   External Normal Normal       Slit Lamp Exam      Right Left   Lids/Lashes Normal Normal   Conjunctiva/Sclera White and quiet White and quiet   Cornea Clear Clear   Anterior Chamber Deep and quiet Deep and quiet   Iris Round  and reactive Round and reactive   Lens Posterior chamber intraocular lens Clear   Anterior Vitreous Normal Normal       Fundus Exam  Right Left   Posterior Vitreous Posterior vitreous detachment    Disc Normal    C/D Ratio 0.5    Macula Focal laser scars,   Macular thickening, Microaneurysms, Clinically significant macular edema, Improved  clinically significant macular edema    Vessels Retinopathy,, severe    Periphery Normal           IMAGING AND PROCEDURES  Imaging and Procedures for 02/05/20  OCT, Retina - OU - Both Eyes       Right Eye Quality was good. Scan locations included subfoveal. Central Foveal Thickness: 220. Progression has improved. Findings include abnormal foveal contour.   Left Eye Quality was good. Scan locations included subfoveal. Central Foveal Thickness: 289. Progression has been stable. Findings include normal foveal contour.   Notes OD with much less CSME status post injection intravitreal Ozurdex.  AntivegF resistant  OD today with PRP inferiorly and temporally planned to regions previously not treated.       Color Fundus Photography Optos - OU - Both Eyes       Right Eye Progression has improved. Disc findings include normal observations. Macula : microaneurysms, exudates.   Left Eye Progression has been stable. Disc findings include normal observations. Macula : microaneurysms.   Notes Severe NPDR with nonperfusion temporally and inferiorly.  For PRP OD today.  Macular exudates posteriorly present OD Ozurdex implant seen inferiorly  OS with severe NPDR, no active disease.       Panretinal Photocoagulation - OD - Right Eye       Time Out Confirmed correct patient, procedure, site, and patient consented.   Anesthesia Topical anesthesia was used. Anesthetic medications included Proparacaine 0.5%.   Laser Information The type of laser was diode. Color was yellow. The duration in seconds was 0.02. The spot size was 390  microns. Laser power was 280. Total spots was 1074.   Post-op The patient tolerated the procedure well. There were no complications. The patient received written and verbal post procedure care education.                 ASSESSMENT/PLAN:  No problem-specific Assessment & Plan notes found for this encounter.      ICD-10-CM   1. Severe nonproliferative diabetic retinopathy of right eye, with macular edema, associated with type 2 diabetes mellitus (HCC)  E11.3411 OCT, Retina - OU - Both Eyes    Color Fundus Photography Optos - OU - Both Eyes    Panretinal Photocoagulation - OD - Right Eye    1.  CSME OD component of NPDR with some features of labors miliary aneurysms with massive macular edema as this condition in this patient is resistant to all forms of antivegF.  CSME has now improved status post Ozurdex implant.  2.  Plan today is for completion of PRP inferiorly and temporally for regions of nonperfusion.  3.  Follow-up OD next to monitor IOP and response to therapy  Ophthalmic Meds Ordered this visit:  No orders of the defined types were placed in this encounter.      Return in about 5 weeks (around 03/11/2020) for DILATE OU, dilate, OD, OCT.  There are no Patient Instructions on file for this visit.   Explained the diagnoses, plan, and follow up with the patient and they expressed understanding.  Patient expressed understanding of the importance of proper follow up care.   Alford Highland Kathrine Rieves M.D. Diseases & Surgery of the Retina and Vitreous [facility] 02/05/20  Abbreviations: M myopia (nearsighted); A astigmatism; H hyperopia (farsighted); P presbyopia; Mrx spectacle prescription;  CTL contact lenses; OD right eye; OS left eye; OU both eyes  XT exotropia; ET esotropia; PEK punctate epithelial keratitis; PEE punctate epithelial erosions; DES dry eye syndrome; MGD meibomian gland dysfunction; ATs artificial tears; PFAT's preservative free  artificial tears; [place] nuclear sclerotic cataract; PSC posterior subcapsular cataract; ERM epi-retinal membrane; PVD posterior vitreous detachment; RD retinal detachment; DM diabetes mellitus; DR diabetic retinopathy; NPDR non-proliferative diabetic retinopathy; PDR proliferative diabetic retinopathy; CSME clinically significant macular edema; DME diabetic macular edema; dbh dot blot hemorrhages; CWS cotton wool spot; POAG primary open angle glaucoma; C/D cup-to-disc ratio; HVF humphrey visual field; GVF goldmann visual field; OCT optical coherence tomography; IOP intraocular pressure; BRVO Branch retinal vein occlusion; CRVO central retinal vein occlusion; CRAO central retinal artery occlusion; BRAO branch retinal artery occlusion; RT retinal tear; SB scleral buckle; PPV pars plana vitrectomy; VH Vitreous hemorrhage; PRP panretinal laser photocoagulation; IVK intravitreal kenalog; VMT vitreomacular traction; MH Macular hole;  NVD neovascularization of the disc; NVE neovascularization elsewhere; AREDS age related eye disease study; ARMD age related macular degeneration; POAG primary open angle glaucoma; EBMD epithelial/anterior basement membrane dystrophy; ACIOL anterior chamber intraocular lens; IOL intraocular lens; PCIOL posterior chamber intraocular lens; Phaco/IOL phacoemulsification with intraocular lens placement; [place] photorefractive keratectomy; LASIK laser assisted in situ keratomileusis; HTN hypertension; DM diabetes mellitus; COPD chronic obstructive pulmonary disease

## 2020-02-07 NOTE — Progress Notes (Signed)
Patient ID: Marie [name], female   DOB: 1955/10/21, 64 y.o.   MRN: [id number]           Reason for Appointment: Consultation for Type 2 Diabetes   History of Present Illness:          Date of diagnosis of type 2 diabetes mellitus: 2010       Background history:   She was likely treated with Metformin alone for the first few years and subsequently given Januvia In 2018 she was also given [place] in addition Current records indicate her highest A1c was 9.6 in 03/2017.  Has been progressively improving since then She was also started on Rybelsus in 02/2019  Recent history:   Most recent A1c is 6.3 done on 02/08/2020, previously 7.2  Non-insulin hypoglycemic drugs the patient is taking are: Metformin 1 g twice daily, Farxiga 10 mg daily, Rybelsus 7 mg daily  Current management, blood sugar patterns and problems identified:  He is concerned that she is having a decreased appetite and may have to force herself to eat  This may have started when she was put on Rybelsus in 02/2019, previously was on 40 mg and is now on 7 mg   She is also concerned about the cost of Rybelsus  Her blood sugar nonfasting today was 125 several hours after breakfast and lab glucose in July was 135  She has maintained her weight recently although may have lost weight previously  She said that she has restricted her diet significantly especially after using the freestyle libre when she was able to cut back on the foods that are making her sugars go up  She is cutting back significantly on carbohydrates like rice and fruit and certain vegetables  Has had no side effects with Farxiga        Side effects from medications have been: Decreased appetite from Rybelsus   Typical meal intake: Breakfast is egg, toast .              Exercise:  Exercise equipment, possibly elliptical, using for 15 min daily   Glucose monitoring:  Not done           Glucometer:  Nine, previously using freestyle libre     Blood  Glucose readings not available   Dietician visit, most recent: none  Weight history:   Wt Readings from Last 3 Encounters:  02/08/20 124 lb (56.2 kg)  12/27/19 122 lb 9.6 oz (55.6 kg)  11/20/19 124 lb 6.4 oz (56.4 kg)    Glycemic control:   Lab Results  Component Value Date   HGBA1C 6.3 (A) 02/08/2020   HGBA1C 7.2 (H) 10/27/2019   HGBA1C 6.9 (A) 07/31/2019   Lab Results  Component Value Date   MICROALBUR 1.0 10/27/2019   LDLCALC 76 10/27/2019   CREATININE 0.70 10/27/2019   Lab Results  Component Value Date   MICRALBCREAT 1.2 10/27/2019    No results found for: FRUCTOSAMINE  Office Visit on 02/08/2020  Component Date Value Ref Range Status  . Hemoglobin A1C 02/08/2020 6.3* 4.0 - 5.6 % Final    Allergies as of 02/08/2020      Reactions   Penicillins Other (See Comments)   Driving couldn't see and head felt heavy  Has patient had a PCN reaction causing immediate rash, facial/tongue/throat swelling, SOB or lightheadedness with hypotension: No Has patient had a PCN reaction causing severe rash involving mucus membranes or skin necrosis: No Has patient had a PCN reaction that required hospitalization: No  Has patient had a PCN reaction occurring within the last 10 years: No If all of the above answers are "NO", then may proceed with Cephalosporin use.   Other Other (See Comments)   Allergic to beef and eggplant causes rash on skin      Medication List       Accurate as of February 08, 2020  3:03 PM. If you have any questions, ask your nurse or doctor.        STOP taking these medications   FreeStyle Libre 14 Day Sensor Misc Stopped by: Reather LittlerAjay Tatijana Bierly, MD     TAKE these medications   amLODipine 5 MG tablet Commonly known as: NORVASC Take 1 tablet (5 mg total) by mouth daily.   aspirin EC 81 MG tablet Take 81 mg by mouth at bedtime.   atorvastatin 40 MG tablet Commonly known as: LIPITOR Take 1 tablet (40 mg total) by mouth daily.   CoQ10 200 MG Caps Take  200 mg by mouth at bedtime.   Farxiga 10 MG Tabs tablet Generic drug: dapagliflozin propanediol Take 10 mg by mouth daily.   Ginkgo Biloba 500 MG Caps Take 500 mg by mouth at bedtime.   lisinopril 40 MG tablet Commonly known as: ZESTRIL Take 1 tablet (40 mg total) by mouth at bedtime.   metFORMIN 500 MG 24 hr tablet Commonly known as: GLUCOPHAGE-XR Take 2 tablets (1,000 mg total) by mouth in the morning and at bedtime.   multivitamin with minerals Tabs tablet Take 1 tablet by mouth daily.   OneTouch Verio test strip Generic drug: glucose blood 1 each by Other route as needed for other. Use as instructed   Rybelsus 7 MG Tabs Generic drug: Semaglutide Take 1 tablet by mouth daily.   Vitamin D3 75 MCG (3000 UT) Tabs Take 3,000 Units by mouth at bedtime.       Allergies:  Allergies  Allergen Reactions  . Penicillins Other (See Comments)    Driving couldn't see and head felt heavy  Has patient had a PCN reaction causing immediate rash, facial/tongue/throat swelling, SOB or lightheadedness with hypotension: No Has patient had a PCN reaction causing severe rash involving mucus membranes or skin necrosis: No Has patient had a PCN reaction that required hospitalization: No Has patient had a PCN reaction occurring within the last 10 years: No If all of the above answers are "NO", then may proceed with Cephalosporin use.   . Other Other (See Comments)    Allergic to beef and eggplant causes rash on skin    Past Medical History:  Diagnosis Date  . CVA (cerebral vascular accident) (HCC) 2015  . Depression   . Diabetes (HCC)    t2  . Gallstones   . Hyperlipemia   . Hypertension   . Migraines     Past Surgical History:  Procedure Laterality Date  . CESAREAN SECTION     x2  . CHOLECYSTECTOMY N/A 05/25/2016   Procedure: LAPAROSCOPIC CHOLECYSTECTOMY WITH INTRAOPERATIVE CHOLANGIOGRAM;  Surgeon: Luretha MurphyMatthew Martin, MD;  Location: [facility] ORS;  Service: General;  Laterality: N/A;   . IR GENERIC HISTORICAL  03/13/2016   IR PERC CHOLECYSTOSTOMY 03/13/2016 Simonne ComeJohn Watts, MD [facility]INTER[facility] RAD  . IR GENERIC HISTORICAL  04/28/2016   IR RADIOLOGIST EVAL & MGMT 04/28/2016 Malachy MoanHeath McCullough, MD [facility] INTER[facility] RAD  . LOOP RECORDER IMPLANT N/A 11/29/2013   Procedure: LOOP RECORDER IMPLANT;  Surgeon: Marinus MawGregg W Taylor, MD;  [facility];  Service: Cardiovascular;  Laterality: N/A;  . TEE WITHOUT  CARDIOVERSION N/A 08/24/2013   Procedure: TRANSESOPHAGEAL ECHOCARDIOGRAM (TEE);  Surgeon: [name], MD;  Location: [facility] ENDOSCOPY;  Service: Cardiovascular;  Laterality: N/A;    Family History  Problem Relation Age of Onset  . Stomach cancer Mother   . High blood pressure Mother   . Diabetes Mother   . Diabetes Maternal Grandfather   . High blood pressure Maternal Grandfather   . Heart disease Neg Hx     Social History:  reports that she has never smoked. She has never used smokeless tobacco. She reports that she does not drink alcohol and does not use drugs.   Review of Systems  Constitutional: Positive for reduced appetite.  Respiratory: Negative for shortness of breath.   Gastrointestinal: Negative for nausea.  Musculoskeletal: Negative for joint pain.  Neurological: Positive for weakness and balance difficulty. Negative for numbness.      Lipid history: Cholesterol well controlled with atorvastatin 40 mg    Lab Results  Component Value Date   CHOL 150 10/27/2019   HDL 45.60 10/27/2019   LDLCALC 76 10/27/2019   TRIG 141.0 10/27/2019   CHOLHDL 3 10/27/2019           Hypertension: Has been present and treated with lisinopril 40 mg and amlodipine 5 mg Followed by PCP  BP Readings from Last 3 Encounters:  02/08/20 120/70  12/27/19 124/76  11/20/19 124/80    Most recent eye exam was in 11/21  Most recent foot exam: 11/21  Currently known complications of diabetes: Retinopathy of the left eye Normal microalbumin in 7/21  History of CVA with left facial numbness  and speech difficulty: She was on aspirin but she stopped it a couple of weeks ago  LABS:  Office Visit on 02/08/2020  Component Date Value Ref Range Status  . Hemoglobin A1C 02/08/2020 6.3* 4.0 - 5.6 % Final    Physical Examination:  BP 120/70   Pulse 92   Ht 5\' 1"  (1.549 m)   Wt 124 lb (56.2 kg)   SpO2 99%   BMI 23.43 kg/m   Repeat standing blood pressure was 140/80  GENERAL:         Averagely built and nourished  HEENT:         Eye exam shows normal external appearance.  Fundus exam deferred to ophthalmologist Oral exam not indicated   NECK:   There is no lymphadenopathy  Thyroid is not enlarged and no nodules felt.   Carotids are normal to palpation and no bruit heard  LUNGS:         Chest is symmetrical. Lungs are clear to auscultation.   HEART:         Heart sounds:  S1 and S2 are normal. No murmur or click heard., no S3 or S4.   ABDOMEN:   There is no distention present. Liver and spleen are not palpable.  No other mass or tenderness present.    NEUROLOGICAL:   Ankle and biceps jerks are absent bilaterally.    Diabetic Foot Exam - Simple   Simple Foot Form Diabetic Foot exam was performed with the following findings: Yes   Visual Inspection No deformities, no ulcerations, no other skin breakdown bilaterally: Yes Sensation Testing Intact to touch and monofilament testing bilaterally: Yes Pulse Check Posterior Tibialis and Dorsalis pulse intact bilaterally: Yes Comments             Vibration sense is mildly reduced in distal first toes.  MUSCULOSKELETAL:  There is no swelling or deformity  of the peripheral joints.     EXTREMITIES:     There is no ankle edema.  SKIN:       No rash or lesions of concern.        ASSESSMENT:  Diabetes type 2  See history of present illness for detailed discussion of current diabetes management, blood sugar patterns and problems identified  Most recent A1c is excellent at 6.3  Current treatment regimen is Metformin,  Farxiga and Rybelsus 7 mg  She appears to be having good control although blood sugar monitoring has not been done at home Likely has some decreased appetite from Rybelsus but has benefited from this with better control Also has had better control since July since she has modified her diet further and is exercising  Complications of diabetes: Retinopathy, nonproliferative. Minimal changes of neuropathy on exam today  Hypertension and hyperlipidemia well controlled    PLAN:    . Glucose monitoring: She can start back using freestyle libre and will be fine to use it only once a month since this is not covered by Medicare Patient advised to check readings either fasting or 2 hours after meals. Discussed blood sugar targets fasting and after meals She is currently very reluctant to do fingersticks as she does not like doing it and does not have a functioning meter at home  . Diabetes education: Patient will need consultation with dietitian for meal planning, she may be currently getting too much restriction of carbohydrates and balanced meals  . Lifestyle changes: Dietary changes: She likely can have more balanced meals especially at lunch and dinner and may not need to be following a very restricted diet  Exercise regimen: Continue elliptical 15 minutes daily  . Therapy changes: She can try taking only half tablet of the Rybelsus 7 mg for now and if this is still effective and helps her appetite she can switch to 3 mg daily Once she finishes her Marcelline Deist she can combine this with Metformin and switch to [place] [employer] for convenience  . Preventive care needed: Currently up-to-date, will need annual lipids and microalbumin   Emphasized the need to go back on aspirin for stroke prevention since she is taking this as a secondary prevention and reassured her that it is safe to take for her unless she has any bleeding tendencies   Continue current antihypertensives but consider reducing ACE  inhibitor if blood pressure is low normal  Today's visit included evaluation of diabetes history, current management, counseling regarding medications, monitoring, diet, weight as well as management of hypertension, stroke prevention and lipids  Follow-up: 3 months    Patient Instructions  Rybelsus 1/2 tab daily, 30 min before meal  Check blood sugars on waking up 1-2 days a week  Also check blood sugars about 2 hours after meals and do this after different meals by rotation  Recommended blood sugar levels on waking up are 90-130 and about 2 hours after meal is 130-160  Please bring your blood sugar monitor to each visit, thank you  Call before refilling Wenda Overland 02/08/2020, 3:02 PM   Note: This office note was prepared with Dragon voice recognition system technology. Any transcriptional errors that result from this process are unintentional.

## 2020-02-08 ENCOUNTER — Ambulatory Visit (INDEPENDENT_AMBULATORY_CARE_PROVIDER_SITE_OTHER): Payer: Medicare Other | Admitting: Endocrinology

## 2020-02-08 ENCOUNTER — Encounter: Payer: Self-pay | Admitting: Endocrinology

## 2020-02-08 ENCOUNTER — Other Ambulatory Visit: Payer: Self-pay

## 2020-02-08 VITALS — BP 120/70 | HR 92 | Ht 61.0 in | Wt 124.0 lb

## 2020-02-08 DIAGNOSIS — E11319 Type 2 diabetes mellitus with unspecified diabetic retinopathy without macular edema: Secondary | ICD-10-CM | POA: Diagnosis not present

## 2020-02-08 DIAGNOSIS — Z8673 Personal history of transient ischemic attack (TIA), and cerebral infarction without residual deficits: Secondary | ICD-10-CM

## 2020-02-08 DIAGNOSIS — I10 Essential (primary) hypertension: Secondary | ICD-10-CM | POA: Diagnosis not present

## 2020-02-08 LAB — POCT GLUCOSE (DEVICE FOR HOME USE): Glucose Fasting, POC: 125 mg/dL — AB (ref 70–99)

## 2020-02-08 LAB — POCT GLYCOSYLATED HEMOGLOBIN (HGB A1C): Hemoglobin A1C: 6.3 % — AB (ref 4.0–5.6)

## 2020-02-08 MED ORDER — RYBELSUS 7 MG PO TABS
1.0000 | ORAL_TABLET | Freq: Every day | ORAL | 3 refills | Status: DC
Start: 2020-02-08 — End: 2020-05-17

## 2020-02-08 NOTE — Patient Instructions (Addendum)
Rybelsus 1/2 tab daily, 30 min before meal  Check blood sugars on waking up 1-2 days a week  Also check blood sugars about 2 hours after meals and do this after different meals by rotation  Recommended blood sugar levels on waking up are 90-130 and about 2 hours after meal is 130-160  Please bring your blood sugar monitor to each visit, thank you  Call before refilling Marcelline Deist

## 2020-02-19 ENCOUNTER — Other Ambulatory Visit: Payer: Self-pay

## 2020-02-19 ENCOUNTER — Ambulatory Visit
Admission: RE | Admit: 2020-02-19 | Discharge: 2020-02-19 | Disposition: A | Discharge status: Home / Self Care | Payer: Medicare Other | Source: Ambulatory Visit | Attending: Obstetrics & Gynecology | Admitting: Obstetrics & Gynecology

## 2020-02-19 ENCOUNTER — Ambulatory Visit: Payer: Medicare Other

## 2020-02-19 DIAGNOSIS — N6489 Other specified disorders of breast: Secondary | ICD-10-CM

## 2020-02-21 ENCOUNTER — Other Ambulatory Visit: Payer: Self-pay | Admitting: *Deleted

## 2020-02-21 ENCOUNTER — Telehealth: Payer: Self-pay | Admitting: Endocrinology

## 2020-02-21 MED ORDER — DEXCOM G6 TRANSMITTER MISC
0 refills | Status: DC
Start: 2020-02-21 — End: 2020-05-14

## 2020-02-21 MED ORDER — DEXCOM G6 SENSOR MISC
1 refills | Status: DC
Start: 2020-02-21 — End: 2020-05-14

## 2020-02-21 MED ORDER — DEXCOM G6 RECEIVER DEVI
0 refills | Status: DC
Start: 2020-02-21 — End: 2020-05-14

## 2020-02-21 NOTE — Telephone Encounter (Signed)
Rx sent 

## 2020-02-21 NOTE — Telephone Encounter (Signed)
Patient requests a new RX for Dexcom G6 with all Supplies be sent to  [id number] - [place],  - [address] AT [facility] [address] & [address] Phone:  [phone]  Fax:  [phone]

## 2020-02-21 NOTE — Telephone Encounter (Signed)
Okay to send 

## 2020-02-21 NOTE — Telephone Encounter (Signed)
Okay to send this?  I do not see it in her list?

## 2020-02-27 NOTE — Telephone Encounter (Signed)
I explained to her that she needed to call her insurance to see if they would pay for it since she's not on insulin, she said she wasn't going to waste her time with the insurance company, she will see if she can just buy it and pay out of pocket.

## 2020-02-27 NOTE — Telephone Encounter (Signed)
Since she is not on Insulin she does not qualify for this correct?

## 2020-02-27 NOTE — Telephone Encounter (Signed)
She needs to find out from her insurance if they will cover Dexcom without being on insulin

## 2020-02-27 NOTE — Telephone Encounter (Signed)
Pt called says pharmacy told her they faxed a form to Lucianne Muss that needs filled out so pt can pick up Dexcom.   Pt ph [phone].

## 2020-03-11 ENCOUNTER — Ambulatory Visit (INDEPENDENT_AMBULATORY_CARE_PROVIDER_SITE_OTHER): Payer: Medicare Other | Admitting: Ophthalmology

## 2020-03-11 ENCOUNTER — Encounter (INDEPENDENT_AMBULATORY_CARE_PROVIDER_SITE_OTHER): Payer: Self-pay | Admitting: Ophthalmology

## 2020-03-11 ENCOUNTER — Other Ambulatory Visit: Payer: Self-pay

## 2020-03-11 DIAGNOSIS — H35049 Retinal micro-aneurysms, unspecified, unspecified eye: Secondary | ICD-10-CM | POA: Diagnosis not present

## 2020-03-11 DIAGNOSIS — E113411 Type 2 diabetes mellitus with severe nonproliferative diabetic retinopathy with macular edema, right eye: Secondary | ICD-10-CM | POA: Diagnosis not present

## 2020-03-11 DIAGNOSIS — H35021 Exudative retinopathy, right eye: Secondary | ICD-10-CM

## 2020-03-11 NOTE — Assessment & Plan Note (Signed)
Improved overall.

## 2020-03-11 NOTE — Progress Notes (Signed)
03/11/2020     CHIEF COMPLAINT Patient presents for Retina Follow Up   HISTORY OF PRESENT ILLNESS: Marie [name] is a 64 y.o. female who presents to the clinic today for:   HPI    Retina Follow Up    Patient presents with  Diabetic Retinopathy.  In right eye.  This started 5 weeks ago.  Severity is mild.  Duration of 5 weeks.  Since onset it is stable.          Comments    5 Week F/U OD, some component of adult Coats disease superimposed upon diabetic retinopathy and macular edema  Pt denies noticeable changes to [place] OU since last visit. Pt denies ocular pain, flashes of light, or floaters OU.  LBS: pt does not check  Now 4 months post focal laser treatment right eye and 5 weeks post PRP #1 right eye, and 2 months status post injection intravitreal Ozurdex implant to finally resolve the CME, CSME Resistant to all antivegF       Last edited by Edmon Crape, MD on 03/11/2020  3:16 PM. (History)      Referring physician: Mliss Sax, MD [address] [place],  [place] [address]  HISTORICAL INFORMATION:   Selected notes from the MEDICAL RECORD NUMBER    Lab Results  Component Value Date   HGBA1C 6.3 (A) 02/08/2020     CURRENT MEDICATIONS: No current outpatient medications on file. (Ophthalmic Drugs)   No current facility-administered medications for this visit. (Ophthalmic Drugs)   Current Outpatient Medications (Other)  Medication Sig  . amLODipine (NORVASC) 5 MG tablet Take 1 tablet (5 mg total) by mouth daily.  [name] aspirin EC 81 MG tablet Take 81 mg by mouth at bedtime.  [name] atorvastatin (LIPITOR) 40 MG tablet Take 1 tablet (40 mg total) by mouth daily.  . Cholecalciferol (VITAMIN D3) 3000 units TABS Take 3,000 Units by mouth at bedtime.  . Coenzyme Q10 (COQ10) 200 MG CAPS Take 200 mg by mouth at bedtime.   . Continuous Blood Gluc Receiver (DEXCOM G6 RECEIVER) DEVI Use to monitor blood sugar  . Continuous Blood Gluc Sensor (DEXCOM G6 SENSOR) MISC Use  to monitor blood sugar  . Continuous Blood Gluc Transmit (DEXCOM G6 TRANSMITTER) MISC Use to monitor blood sugar  . dapagliflozin propanediol (FARXIGA) 10 MG TABS tablet Take 10 mg by mouth daily.  . Ginkgo Biloba 500 MG CAPS Take 500 mg by mouth at bedtime.   [name] glucose blood (ONETOUCH VERIO) test strip 1 each by Other route as needed for other. Use as instructed (Patient not taking: Reported on 02/08/2020)  . lisinopril (ZESTRIL) 40 MG tablet Take 1 tablet (40 mg total) by mouth at bedtime.  . metFORMIN (GLUCOPHAGE-XR) 500 MG 24 hr tablet Take 2 tablets (1,000 mg total) by mouth in the morning and at bedtime.  . Multiple Vitamin (MULTIVITAMIN WITH MINERALS) TABS tablet Take 1 tablet by mouth daily.  . RYBELSUS 7 MG TABS Take 1 tablet by mouth daily.   No current facility-administered medications for this visit. (Other)      REVIEW OF SYSTEMS:    ALLERGIES Allergies  Allergen Reactions  . Penicillins Other (See Comments)    Driving couldn't see and head felt heavy  Has patient had a PCN reaction causing immediate rash, facial/tongue/throat swelling, SOB or lightheadedness with hypotension: No Has patient had a PCN reaction causing severe rash involving mucus membranes or skin necrosis: No Has patient had a PCN reaction that  required hospitalization: No Has patient had a PCN reaction occurring within the last 10 years: No If all of the above answers are "NO", then may proceed with Cephalosporin use.   . Other Other (See Comments)    Allergic to beef and eggplant causes rash on skin    PAST MEDICAL HISTORY Past Medical History:  Diagnosis Date  . CVA (cerebral vascular accident) (HCC) 2015  . Depression   . Diabetes (HCC)    t2  . Gallstones   . Hyperlipemia   . Hypertension   . Migraines    Past Surgical History:  Procedure Laterality Date  . BREAST BIOPSY Right 05/2019  . CESAREAN SECTION     x2  . CHOLECYSTECTOMY N/A 05/25/2016   Procedure: LAPAROSCOPIC  CHOLECYSTECTOMY WITH INTRAOPERATIVE CHOLANGIOGRAM;  Surgeon: Luretha MurphyMatthew Martin, MD;  Location: [facility];  Service: General;  Laterality: N/A;  . IR GENERIC HISTORICAL  03/13/2016   IR PERC CHOLECYSTOSTOMY 03/13/2016 Simonne ComeJohn Watts, MD [facility]INTER[facility] RAD  . IR GENERIC HISTORICAL  04/28/2016   IR RADIOLOGIST EVAL & MGMT 04/28/2016 Malachy MoanHeath McCullough, MD [facility] INTER[facility]  . LOOP RECORDER IMPLANT N/A 11/29/2013   Procedure: LOOP RECORDER IMPLANT;  Surgeon: Marinus MawGregg W Taylor, MD;  [facility];  Service: Cardiovascular;  Laterality: N/A;  . TEE WITHOUT CARDIOVERSION N/A 08/24/2013   Procedure: TRANSESOPHAGEAL ECHOCARDIOGRAM (TEE);  Surgeon: Thurmon FairMihai Croitoru, MD;  [facility];  Service: Cardiovascular;  Laterality: N/A;    FAMILY HISTORY Family History  Problem Relation Age of Onset  . Stomach cancer Mother   . High blood pressure Mother   . Diabetes Mother   . Diabetes Maternal Grandfather   . High blood pressure Maternal Grandfather   . Heart disease Neg Hx     SOCIAL HISTORY Social History   Tobacco Use  . Smoking status: Never Smoker  . Smokeless tobacco: Never Used  Vaping Use  . Vaping Use: Never used  Substance Use Topics  . Alcohol use: No    Alcohol/week: 1.0 standard drink    Types: 1 Standard drinks or equivalent per week    Comment: Social  . Drug use: No         OPHTHALMIC EXAM: Base Eye Exam    Visual Acuity (ETDRS)      Right Left   Dist [place] 20/100 20/20   Dist ph [place] NI        Tonometry (Tonopen, 2:09 PM)      Right Left   Pressure 20 19       Pupils      Pupils Dark Light Shape React APD   Right PERRL 5 4 Round Brisk None   Left PERRL 5 4 Round Brisk None       Visual Fields (Counting fingers)      Left Right    Full Full       Extraocular Movement      Right Left    Full Full       Neuro/Psych    Oriented x3: Yes   Mood/Affect: Normal       Dilation    Right eye: 1.0% Mydriacyl, 2.5% Phenylephrine @ 2:13 PM        Slit Lamp and  Fundus Exam    External Exam      Right Left   External Normal Normal       Slit Lamp Exam      Right Left   Lids/Lashes Normal Normal   Conjunctiva/Sclera White and quiet  White and quiet   Cornea Clear Clear   Anterior Chamber Deep and quiet Deep and quiet   Iris Round and reactive Round and reactive   Lens Posterior chamber intraocular lens Clear   Anterior Vitreous Normal Normal       Fundus Exam      Right Left   Posterior Vitreous Posterior vitreous detachment    Disc Normal    C/D Ratio 0.5    Macula Focal laser scars,   Macular thickening, Microaneurysms, Clinically significant macular edema, Improved  clinically significant macular edema    Vessels Retinopathy,, severe    Periphery Normal, good prp, room inferiorly for more laser in the future           IMAGING AND PROCEDURES  Imaging and Procedures for 03/11/20  OCT, Retina - OU - Both Eyes       Right Eye Quality was good. Scan locations included subfoveal. Central Foveal Thickness: 207. Progression has improved.   Left Eye Quality was good. Scan locations included subfoveal. Central Foveal Thickness: 291. Progression has been stable. Findings include normal foveal contour.   Notes Some foveal atrophy OD as a consequence of prior massive CME, CSME                ASSESSMENT/PLAN:  Miliary aneurysms of retina Much less active and fewer miliary aneurysms visible  Coat's disease, right Improved overall      ICD-10-CM   1. Severe nonproliferative diabetic retinopathy of right eye, with macular edema, associated with type 2 diabetes mellitus (HCC)  E11.3411 OCT, Retina - OU - Both Eyes  2. Miliary aneurysms of retina  H35.049   3. Coat's disease, right  H35.021     1.  OD, with much less CME from combination of diabetic macular edema as well as adult Coats disease (labors miliary aneurysms ) status post recent PRP to regions of retinal nonperfusion and focal ablations of miliary aneurysms  2.   We will continue to monitor.  Should CME recur, may need repeat Ozurdex in the future.  Next visit dilate OU and restudy macular and retinal perfusion peripherally looking for signs of retinal nonperfusion leading to macular edema OD  3.  Ophthalmic Meds Ordered this visit:  No orders of the defined types were placed in this encounter.      Return in about 8 weeks (around 05/06/2020) for DILATE OU, COLOR FP, OPTOS FFA R/L, OCT.  There are no Patient Instructions on file for this visit.   Explained the diagnoses, plan, and follow up with the patient and they expressed understanding.  Patient expressed understanding of the importance of proper follow up care.   Alford Highland Braxson Hollingsworth M.D. Diseases & Surgery of the Retina and Vitreous [facility] 03/11/20     Abbreviations: M myopia (nearsighted); A astigmatism; H hyperopia (farsighted); P presbyopia; Mrx spectacle prescription;  CTL contact lenses; OD right eye; OS left eye; OU both eyes  XT exotropia; ET esotropia; PEK punctate epithelial keratitis; PEE punctate epithelial erosions; DES dry eye syndrome; MGD meibomian gland dysfunction; ATs artificial tears; PFAT's preservative free artificial tears; NSC nuclear sclerotic cataract; PSC posterior subcapsular cataract; ERM epi-retinal membrane; PVD posterior vitreous detachment; RD retinal detachment; DM diabetes mellitus; DR diabetic retinopathy; NPDR non-proliferative diabetic retinopathy; PDR proliferative diabetic retinopathy; CSME clinically significant macular edema; DME diabetic macular edema; dbh dot blot hemorrhages; CWS cotton wool spot; POAG primary open angle glaucoma; C/D cup-to-disc ratio; HVF humphrey visual field; GVF goldmann visual  field; OCT optical coherence tomography; IOP intraocular pressure; BRVO Branch retinal vein occlusion; CRVO central retinal vein occlusion; CRAO central retinal artery occlusion; BRAO branch retinal artery occlusion; RT retinal tear; SB  scleral buckle; PPV pars plana vitrectomy; VH Vitreous hemorrhage; PRP panretinal laser photocoagulation; IVK intravitreal kenalog; VMT vitreomacular traction; MH Macular hole;  NVD neovascularization of the disc; NVE neovascularization elsewhere; AREDS age related eye disease study; ARMD age related macular degeneration; POAG primary open angle glaucoma; EBMD epithelial/anterior basement membrane dystrophy; ACIOL anterior chamber intraocular lens; IOL intraocular lens; PCIOL posterior chamber intraocular lens; Phaco/IOL phacoemulsification with intraocular lens placement; PRK photorefractive keratectomy; LASIK laser assisted in situ keratomileusis; HTN hypertension; DM diabetes mellitus; COPD chronic obstructive pulmonary disease

## 2020-03-11 NOTE — Assessment & Plan Note (Signed)
Much less active and fewer miliary aneurysms visible

## 2020-03-27 ENCOUNTER — Telehealth: Payer: Self-pay | Admitting: Cardiology

## 2020-03-27 NOTE — Telephone Encounter (Signed)
Spoke to pt who was inquiring about specific dates of when she had both CVA. Nurse informed pt that based on chart review, dates aren't noted and to contact medical records.

## 2020-03-27 NOTE — Telephone Encounter (Signed)
Patient wants to know the two dates that were recorded of her having a stroke.

## 2020-04-09 ENCOUNTER — Telehealth: Payer: Self-pay | Admitting: Endocrinology

## 2020-04-09 NOTE — Telephone Encounter (Signed)
Patient called stating [facility] has tried faxing [place] the PA for the freestyle [place] sensors. Have we gotten anything from them? Please advise.

## 2020-04-09 NOTE — Telephone Encounter (Signed)
Please call patient, I explained to her in November that her insurance will not pay for this since she's not on Insulin. She stated at that time she was just going to pay for it out of pocket.

## 2020-04-09 NOTE — Telephone Encounter (Signed)
I spoke with patient again today, she states that she has changed insurance to [facility] and that they would pay for the dexcom, I explained to her again that since this was medicare they will not cover the dexcom if she is not on insulin injections, she voiced understanding.

## 2020-04-29 ENCOUNTER — Other Ambulatory Visit: Payer: Medicare Other

## 2020-04-29 ENCOUNTER — Telehealth: Payer: Self-pay | Admitting: Endocrinology

## 2020-04-29 NOTE — Telephone Encounter (Signed)
[facility] called asking to please fax them the last office clinical note for patient's Freestyle Libre 2 Sensors. Fax # [phone]

## 2020-04-29 NOTE — Telephone Encounter (Signed)
Office notes were faxed back

## 2020-05-06 ENCOUNTER — Encounter (INDEPENDENT_AMBULATORY_CARE_PROVIDER_SITE_OTHER): Payer: Medicare Other | Admitting: Ophthalmology

## 2020-05-06 ENCOUNTER — Ambulatory Visit: Payer: Medicare Other | Admitting: Endocrinology

## 2020-05-07 ENCOUNTER — Encounter (INDEPENDENT_AMBULATORY_CARE_PROVIDER_SITE_OTHER): Payer: Medicare Other | Admitting: Ophthalmology

## 2020-05-07 ENCOUNTER — Ambulatory Visit (INDEPENDENT_AMBULATORY_CARE_PROVIDER_SITE_OTHER): Payer: Medicare PPO | Admitting: Ophthalmology

## 2020-05-07 ENCOUNTER — Encounter (INDEPENDENT_AMBULATORY_CARE_PROVIDER_SITE_OTHER): Payer: Self-pay | Admitting: Ophthalmology

## 2020-05-07 ENCOUNTER — Other Ambulatory Visit: Payer: Self-pay

## 2020-05-07 DIAGNOSIS — E113411 Type 2 diabetes mellitus with severe nonproliferative diabetic retinopathy with macular edema, right eye: Secondary | ICD-10-CM

## 2020-05-07 DIAGNOSIS — E113492 Type 2 diabetes mellitus with severe nonproliferative diabetic retinopathy without macular edema, left eye: Secondary | ICD-10-CM

## 2020-05-07 MED ORDER — FLUORESCEIN SODIUM 10 % IV SOLN
500.0000 mg | INTRAVENOUS | Status: AC | PRN
  Administered 2020-05-07: 500 mg via INTRAVENOUS

## 2020-05-07 NOTE — Assessment & Plan Note (Addendum)
Plan Ozurdex injection next 2 weeks followed thereafter by PRP 1 to 3 weeks later,And 4 to 5 weeks later

## 2020-05-07 NOTE — Progress Notes (Signed)
05/07/2020     CHIEF COMPLAINT Patient presents for Retina Follow Up (8 Week F/U OU, FP/FFA R/L//Pt c/o blurry VA off and on when reading, especially when looking down. Pt sts VA will blur while reading, and she has to rub her eyes to clear them. Pt sts, "if I'm looking up, it's not as bad." VA OU overall stable. /LBS: has not checked recently)   HISTORY OF PRESENT ILLNESS: Marie [name] is a 65 y.o. female who presents to the clinic today for:   HPI    Retina Follow Up    Patient presents with  Diabetic Retinopathy.  In both eyes.  This started 8 weeks ago.  Severity is mild.  Duration of 8 weeks.  Since onset it is stable. Additional comments: 8 Week F/U OU, FP/FFA R/L  Pt c/o blurry VA off and on when reading, especially when looking down. Pt sts VA will blur while reading, and she has to rub her eyes to clear them. Pt sts, "if I'm looking up, it's not as bad." VA OU overall stable.  LBS: has not checked recently       Last edited by Ileana Roup, COA on 05/07/2020  3:47 PM. (History)      Referring physician: Mliss Sax, MD [address] [place],  [place] [address]  HISTORICAL INFORMATION:   Selected notes from the MEDICAL RECORD NUMBER    Lab Results  Component Value Date   HGBA1C 6.3 (A) 02/08/2020     CURRENT MEDICATIONS: No current outpatient medications on file. (Ophthalmic Drugs)   No current facility-administered medications for this visit. (Ophthalmic Drugs)   Current Outpatient Medications (Other)  Medication Sig  . amLODipine (NORVASC) 5 MG tablet Take 1 tablet (5 mg total) by mouth daily.  [name] aspirin EC 81 MG tablet Take 81 mg by mouth at bedtime.  [name] atorvastatin (LIPITOR) 40 MG tablet Take 1 tablet (40 mg total) by mouth daily.  . Cholecalciferol (VITAMIN D3) 3000 units TABS Take 3,000 Units by mouth at bedtime.  . Coenzyme Q10 (COQ10) 200 MG CAPS Take 200 mg by mouth at bedtime.   . Continuous Blood Gluc Receiver (DEXCOM G6 RECEIVER)  DEVI Use to monitor blood sugar  . Continuous Blood Gluc Sensor (DEXCOM G6 SENSOR) MISC Use to monitor blood sugar  . Continuous Blood Gluc Transmit (DEXCOM G6 TRANSMITTER) MISC Use to monitor blood sugar  . dapagliflozin propanediol (FARXIGA) 10 MG TABS tablet Take 10 mg by mouth daily.  . Ginkgo Biloba 500 MG CAPS Take 500 mg by mouth at bedtime.   [name] glucose blood (ONETOUCH VERIO) test strip 1 each by Other route as needed for other. Use as instructed (Patient not taking: Reported on 02/08/2020)  . lisinopril (ZESTRIL) 40 MG tablet Take 1 tablet (40 mg total) by mouth at bedtime.  . metFORMIN (GLUCOPHAGE-XR) 500 MG 24 hr tablet Take 2 tablets (1,000 mg total) by mouth in the morning and at bedtime.  . Multiple Vitamin (MULTIVITAMIN WITH MINERALS) TABS tablet Take 1 tablet by mouth daily.  . RYBELSUS 7 MG TABS Take 1 tablet by mouth daily.   No current facility-administered medications for this visit. (Other)      REVIEW OF SYSTEMS:    ALLERGIES Allergies  Allergen Reactions  . Penicillins Other (See Comments)    Driving couldn't see and head felt heavy  Has patient had a PCN reaction causing immediate rash, facial/tongue/throat swelling, SOB or lightheadedness with hypotension: No Has patient had a PCN  reaction causing severe rash involving mucus membranes or skin necrosis: No Has patient had a PCN reaction that required hospitalization: No Has patient had a PCN reaction occurring within the last 10 years: No If all of the above answers are "NO", then may proceed with Cephalosporin use.   . Other Other (See Comments)    Allergic to beef and eggplant causes rash on skin    PAST MEDICAL HISTORY Past Medical History:  Diagnosis Date  . CVA (cerebral vascular accident) (HCC) 2015  . Depression   . Diabetes (HCC)    t2  . Gallstones   . Hyperlipemia   . Hypertension   . Migraines    Past Surgical History:  Procedure Laterality Date  . BREAST BIOPSY Right 05/2019  .  CESAREAN SECTION     x2  . CHOLECYSTECTOMY N/A 05/25/2016   Procedure: LAPAROSCOPIC CHOLECYSTECTOMY WITH INTRAOPERATIVE CHOLANGIOGRAM;  Surgeon: Luretha Murphy, MD;  Location: [facility] ORS;  Service: General;  Laterality: N/A;  . IR GENERIC HISTORICAL  03/13/2016   IR PERC CHOLECYSTOSTOMY 03/13/2016 [name], MD [facility]INTERV RAD  . IR GENERIC HISTORICAL  04/28/2016   IR RADIOLOGIST EVAL & MGMT 04/28/2016 [name], MD [facility] INTERV RAD  . LOOP RECORDER IMPLANT N/A 11/29/2013   Procedure: LOOP RECORDER IMPLANT;  Surgeon: Marinus Maw, MD;  Location: [facility] CATH LAB;  Service: Cardiovascular;  Laterality: N/A;  . TEE WITHOUT CARDIOVERSION N/A 08/24/2013   Procedure: TRANSESOPHAGEAL ECHOCARDIOGRAM (TEE);  Surgeon: [name], MD;  Location: [facility] ENDOSCOPY;  Service: Cardiovascular;  Laterality: N/A;    FAMILY HISTORY Family History  Problem Relation Age of Onset  . Stomach cancer Mother   . High blood pressure Mother   . Diabetes Mother   . Diabetes Maternal Grandfather   . High blood pressure Maternal Grandfather   . Heart disease Neg Hx     SOCIAL HISTORY Social History   Tobacco Use  . Smoking status: Never Smoker  . Smokeless tobacco: Never Used  Vaping Use  . Vaping Use: Never used  Substance Use Topics  . Alcohol use: No    Alcohol/week: 1.0 standard drink    Types: 1 Standard drinks or equivalent per week    Comment: Social  . Drug use: No         OPHTHALMIC EXAM: Base Eye Exam    Visual Acuity (ETDRS)      Right Left   Dist [place] 20/200 20/20   Dist ph [place] NI        Tonometry (Tonopen, 3:47 PM)      Right Left   Pressure 18 19       Pupils      Pupils Dark Light Shape React APD   Right PERRL 5 4 Round Brisk None   Left PERRL 5 4 Round Brisk None       Visual Fields (Counting fingers)      Left Right    Full Full       Extraocular Movement      Right Left    Full Full       Neuro/Psych    Oriented x3: Yes   Mood/Affect: Normal       Dilation     Both eyes: 1.0% Mydriacyl, 2.5% Phenylephrine @ 3:50 PM        Slit Lamp and Fundus Exam    External Exam      Right Left   External Normal Normal       Slit Lamp Exam  Right Left   Lids/Lashes Normal Normal   Conjunctiva/Sclera White and quiet White and quiet   Cornea Clear Clear   Anterior Chamber Deep and quiet Deep and quiet   Iris Round and reactive Round and reactive   Lens Posterior chamber intraocular lens, 1+ haze 2+ Nuclear sclerosis, Cortical cataract   Anterior Vitreous Normal Normal       Fundus Exam      Right Left   Posterior Vitreous Posterior vitreous detachment, Central vitreous floaters Normal   Disc Normal Normal   C/D Ratio 0.5 0.5-0.6   Macula Focal laser scars,   Macular thickening, Microaneurysms, Severe clinically significant macular edema Microaneurysms   Vessels Retinopathy,, severe NPDR-Severe   Periphery Normal, good prp, room inferiorly for more laser in the future Normal          IMAGING AND PROCEDURES  Imaging and Procedures for 05/07/20  OCT, Retina - OU - Both Eyes       Right Eye Quality was good. Scan locations included subfoveal. Central Foveal Thickness: 668. Progression has worsened.   Left Eye Quality was good. Scan locations included subfoveal. Central Foveal Thickness: 318. Progression has been stable. Findings include normal foveal contour.   Notes Massive CSME recurs OD, now 3 months post injection Ozurdex.  This means we will need to repeat the Ozurdex OD to improve the CSME and also to protect the macular from complete "wall-to-wall" and retinal photocoagulation for the severe retinal nonperfusion present in this right eye       Color Fundus Photography Optos - OU - Both Eyes       Right Eye Progression has worsened. Disc findings include normal observations. Macula : edema, microaneurysms.   Left Eye Progression has been stable. Disc findings include normal observations. Macula : normal observations.  Vessels : normal observations. Periphery : normal observations.   Notes Good PRP anteriorly nearly 360 yet room for PRP posteriorly and superiorly OD       Fluorescein Angiography Optos (Transit OD)       Injection:  500 mg Fluorescein Sodium 10 % injection   NDC: [phone]   Route: IntravenousRight Eye   Progression has worsened. Early phase findings include leakage, microaneurysm. Mid/Late phase findings include microaneurysm, leakage.   Left Eye   Progression has been stable. Mid/Late phase findings include leakage.   Notes Severe CSME recurrent, now 16-month months post injection of Ozurdex OD, will need repeat Ozurdex to protect the macula from planned wall-to-wall PRP multi session OD                ASSESSMENT/PLAN:  Severe nonproliferative diabetic retinopathy of right eye, with macular edema, associated with type 2 diabetes mellitus (HCC) Plan Ozurdex injection next 2 weeks followed thereafter by PRP 1 to 3 weeks later,And 4 to 5 weeks later      ICD-10-CM   1. Severe nonproliferative diabetic retinopathy of right eye, with macular edema, associated with type 2 diabetes mellitus (HCC)  E11.3411 OCT, Retina - OU - Both Eyes    Color Fundus Photography Optos - OU - Both Eyes    Fluorescein Angiography Optos (Transit OD)    Fluorescein Sodium 10 % injection 500 mg  2. Severe nonproliferative diabetic retinopathy of left eye without macular edema associated with type 2 diabetes mellitus (HCC)  E11.3492 OCT, Retina - OU - Both Eyes    Color Fundus Photography Optos - OU - Both Eyes    Fluorescein Angiography Optos (Transit OD)  Fluorescein Sodium 10 % injection 500 mg    1.  Findings reviewed in detail with the patient,  I have discussed that the macular condition of the right eye is only sensitive to Ozurdex, thus we will deliver this next visit followed soon thereafter by complete wall-to-wall PRP to decrease the vegF burden in this right eye and  hopefully to protect and prevent CSME massive CME from recurring.  I will also explained that the only therapy thereafter would be of complete vitrectomy which would also increase oxygenation to the entire posterior segment of the right eye.  2.  3.  Ophthalmic Meds Ordered this visit:  Meds ordered this encounter  Medications  . Fluorescein Sodium 10 % injection 500 mg       Return in about 2 weeks (around 05/21/2020) for dilate, OD,   OZURDEX AND OCT OD.  There are no Patient Instructions on file for this visit.   Explained the diagnoses, plan, and follow up with the patient and they expressed understanding.  Patient expressed understanding of the importance of proper follow up care.   Alford Highland Coleta Grosshans M.D. Diseases & Surgery of the Retina and Vitreous [facility] 05/07/20     Abbreviations: M myopia (nearsighted); A astigmatism; H hyperopia (farsighted); P presbyopia; Mrx spectacle prescription;  CTL contact lenses; OD right eye; OS left eye; OU both eyes  XT exotropia; ET esotropia; PEK punctate epithelial keratitis; PEE punctate epithelial erosions; DES dry eye syndrome; MGD meibomian gland dysfunction; ATs artificial tears; PFAT's preservative free artificial tears; NSC nuclear sclerotic cataract; PSC posterior subcapsular cataract; ERM epi-retinal membrane; PVD posterior vitreous detachment; RD retinal detachment; DM diabetes mellitus; DR diabetic retinopathy; NPDR non-proliferative diabetic retinopathy; PDR proliferative diabetic retinopathy; CSME clinically significant macular edema; DME diabetic macular edema; dbh dot blot hemorrhages; CWS cotton wool spot; POAG primary open angle glaucoma; C/D cup-to-disc ratio; HVF humphrey visual field; GVF goldmann visual field; OCT optical coherence tomography; IOP intraocular pressure; BRVO Branch retinal vein occlusion; CRVO central retinal vein occlusion; CRAO central retinal artery occlusion; BRAO branch retinal artery  occlusion; RT retinal tear; SB scleral buckle; PPV pars plana vitrectomy; VH Vitreous hemorrhage; PRP panretinal laser photocoagulation; IVK intravitreal kenalog; VMT vitreomacular traction; MH Macular hole;  NVD neovascularization of the disc; NVE neovascularization elsewhere; AREDS age related eye disease study; ARMD age related macular degeneration; POAG primary open angle glaucoma; EBMD epithelial/anterior basement membrane dystrophy; ACIOL anterior chamber intraocular lens; IOL intraocular lens; PCIOL posterior chamber intraocular lens; Phaco/IOL phacoemulsification with intraocular lens placement; PRK photorefractive keratectomy; LASIK laser assisted in situ keratomileusis; HTN hypertension; DM diabetes mellitus; COPD chronic obstructive pulmonary disease

## 2020-05-09 ENCOUNTER — Ambulatory Visit: Payer: Medicare Other | Admitting: Family Medicine

## 2020-05-13 ENCOUNTER — Other Ambulatory Visit: Payer: Self-pay

## 2020-05-14 ENCOUNTER — Encounter: Payer: Self-pay | Admitting: Family Medicine

## 2020-05-14 ENCOUNTER — Ambulatory Visit: Payer: Medicare PPO | Admitting: Family Medicine

## 2020-05-14 VITALS — BP 138/76 | HR 89 | Temp 97.7°F | Ht 61.0 in | Wt 125.8 lb

## 2020-05-14 DIAGNOSIS — R262 Difficulty in walking, not elsewhere classified: Secondary | ICD-10-CM

## 2020-05-14 DIAGNOSIS — R42 Dizziness and giddiness: Secondary | ICD-10-CM | POA: Diagnosis not present

## 2020-05-14 DIAGNOSIS — E11319 Type 2 diabetes mellitus with unspecified diabetic retinopathy without macular edema: Secondary | ICD-10-CM | POA: Diagnosis not present

## 2020-05-14 DIAGNOSIS — L84 Corns and callosities: Secondary | ICD-10-CM | POA: Diagnosis not present

## 2020-05-14 DIAGNOSIS — I10 Essential (primary) hypertension: Secondary | ICD-10-CM

## 2020-05-14 MED ORDER — LISINOPRIL 40 MG PO TABS: 40.0000 mg | ORAL_TABLET | Freq: Every day | ORAL | 3 refills | Status: DC

## 2020-05-14 NOTE — Progress Notes (Signed)
[facility] [address] [place] [address] Dept: [phone] Dept Fax: [phone]  Acute Office Visit  Subjective:    Patient ID: Marie [name], female    DOB: Aug 12, 1955, 65 y.o..   MRN: [id number]  Chief Complaint  Patient presents with  . Foot Injury    Heel of right foot painful    History of Present Illness:  Patient is in today with severla issues:  Marie [name] notes the development of several hard areas on the sole of her right foot. She had previously been seen with a right foot injury in the Fall. She notes this has actually resolved. The current areas of concern, she had looked at previously and had the hardened areas removed, but now they have recurred. As she has a history of Type 2 diabetes, she would like these examined.  Marie [name] notes that she periodically has had lightheadedness when standing up. She fell at one point and broke a tooth. She is no longer taking her amlodipine or lisinopril and doesn't know if she needs to continue these.  Marie [name] reports she continues to see Dr. Luciana Axe (ophthalmology). He has been treating her for proliferative diabetic retinopathy. She was referred to Dr. Emily Filbert and underwent cataract surgery. He referred her back to Dr. Luciana Axe after the surgery due to the degree of issues with her right eye. She has had multiple laser treatments, however, at this point, she notes she is blind in the right eye.   Marie [name] notes that she was previously screened for colon cancer with a Cologuard tests. It was normal. She is unsure of how often she needs to have this done.  Past Medical History: Patient Active Problem List   Diagnosis Date Noted  . Coat's disease, right 10/31/2019  . Miliary aneurysms of retina 10/31/2019  . Retinopathy due to secondary diabetes mellitus (HCC) 10/26/2019  . Spastic bladder 09/26/2019  . Overflow incontinence 09/26/2019  . Severe nonproliferative diabetic  retinopathy of right eye, with macular edema, associated with type 2 diabetes mellitus (HCC) 08/14/2019  . Severe nonproliferative diabetic retinopathy of left eye (HCC) 08/14/2019  . Type 2 diabetes mellitus without obesity (HCC) 05/07/2019  . Essential hypertension 05/07/2019  . Health maintenance alteration 06/03/2018  . S/P laparoscopic cholecystectomy Feb 2018 05/25/2016  . Left ovarian enlargement 03/13/2016  . Leukocytosis 03/13/2016  . Cholecystitis 03/12/2016  . Weakness 07/01/2015  . Diabetic peripheral neuropathy (HCC) 01/24/2015  . Hypertensive heart disease   . History of CVA (cerebrovascular accident) (multiple, recurrent) 06/12/2013  . Cerebral atherosclerosis 06/12/2013  . Hyperlipidemia   . Migraines   . Diabetes with retinopathy [facility])    Past Surgical History:  Procedure Laterality Date  . BREAST BIOPSY Right 05/2019  . CESAREAN SECTION     x2  . CHOLECYSTECTOMY N/A 05/25/2016   Procedure: LAPAROSCOPIC CHOLECYSTECTOMY WITH INTRAOPERATIVE CHOLANGIOGRAM;  Surgeon: Luretha Murphy, MD;  Location: [facility] ORS;  Service: General;  Laterality: N/A;  . IR GENERIC HISTORICAL  03/13/2016   IR PERC CHOLECYSTOSTOMY 03/13/2016 [name], MD [facility]INTERV RAD  . IR GENERIC HISTORICAL  04/28/2016   IR RADIOLOGIST EVAL & MGMT 04/28/2016 [name], MD [facility] INTER[facility] RAD  . LOOP RECORDER IMPLANT N/A 11/29/2013   Procedure: LOOP RECORDER IMPLANT;  Surgeon: Marinus Maw, MD;  Location: [facility] CATH LAB;  Service: Cardiovascular;  Laterality: N/A;  . TEE WITHOUT CARDIOVERSION N/A 08/24/2013   Procedure: TRANSESOPHAGEAL ECHOCARDIOGRAM (TEE);  Surgeon: [name], MD;  Location: [facility]  ENDOSCOPY;  Service: Cardiovascular;  Laterality: N/A;   Family History  Problem Relation Age of Onset  . Stomach cancer Mother   . High blood pressure Mother   . Diabetes Mother   . Diabetes Maternal Grandfather   . High blood pressure Maternal Grandfather   . Heart disease Neg Hx    Outpatient Medications  Prior to Visit  Medication Sig Dispense Refill  . aspirin EC 81 MG tablet Take 81 mg by mouth at bedtime.    [name] atorvastatin (LIPITOR) 40 MG tablet Take 1 tablet (40 mg total) by mouth daily. 90 tablet 3  . Cholecalciferol (VITAMIN D3) 3000 units TABS Take 3,000 Units by mouth at bedtime.    . dapagliflozin propanediol (FARXIGA) 10 MG TABS tablet Take 10 mg by mouth daily. 90 tablet 3  . Ginkgo Biloba 500 MG CAPS Take 500 mg by mouth at bedtime.     . metFORMIN (GLUCOPHAGE-XR) 500 MG 24 hr tablet Take 2 tablets (1,000 mg total) by mouth in the morning and at bedtime. 360 tablet 3  . Multiple Vitamin (MULTIVITAMIN WITH MINERALS) TABS tablet Take 1 tablet by mouth daily.    . RYBELSUS 7 MG TABS Take 1 tablet by mouth daily. 30 tablet 3  . glucose blood test strip 1 each by Other route as needed for other. Use as instructed (Patient not taking: No sig reported)    . amLODipine (NORVASC) 5 MG tablet Take 1 tablet (5 mg total) by mouth daily. (Patient not taking: Reported on 05/14/2020) 90 tablet 3  . Coenzyme Q10 (COQ10) 200 MG CAPS Take 200 mg by mouth at bedtime.  (Patient not taking: Reported on 05/14/2020)    . Continuous Blood Gluc Receiver (DEXCOM G6 RECEIVER) DEVI Use to monitor blood sugar (Patient not taking: Reported on 05/14/2020) 1 each 0  . Continuous Blood Gluc Sensor (DEXCOM G6 SENSOR) MISC Use to monitor blood sugar (Patient not taking: Reported on 05/14/2020) 1 each 1  . Continuous Blood Gluc Transmit (DEXCOM G6 TRANSMITTER) MISC Use to monitor blood sugar (Patient not taking: Reported on 05/14/2020) 1 each 0  . lisinopril (ZESTRIL) 40 MG tablet Take 1 tablet (40 mg total) by mouth at bedtime. (Patient not taking: Reported on 05/14/2020) 90 tablet 3   No facility-administered medications prior to visit.   Allergies  Allergen Reactions  . Penicillins Other (See Comments)    Driving couldn't see and head felt heavy  Has patient had a PCN reaction causing immediate rash, facial/tongue/throat  swelling, SOB or lightheadedness with hypotension: No Has patient had a PCN reaction causing severe rash involving mucus membranes or skin necrosis: No Has patient had a PCN reaction that required hospitalization: No Has patient had a PCN reaction occurring within the last 10 years: No If all of the above answers are "NO", then may proceed with Cephalosporin use.   . Other Other (See Comments)    Allergic to beef and eggplant causes rash on skin     Objective:   Today's Vitals   05/14/20 1433  BP: 138/76  Pulse: 89  Temp: 97.7 F (36.5 C)  TempSrc: Temporal  SpO2: 96%  Weight: 125 lb 12.8 oz (57.1 kg)  Height: 5\' 1"  (1.549 m)   Body mass index is 23.77 kg/m.   General: Well developed, well nourished. No acute distress. Foot: On the right foot there are two hyperkeratotic lesions over the metatarsal heads on the sole of the foot. No   redness noted. Psych: Alert and oriented.  Normal mood and affect.  Health Maintenance Due  Topic Date Due  . HIV Screening  Never done  . COLONOSCOPY (Pts 45-29yrs Insurance coverage will need to be confirmed)  Never done  . DEXA SCAN  Never done  . PNA vac Low Risk Adult (1 of 2 - PCV13) Never done     Assessment & Plan:   1. Corn of foot The lesions on the foot are corns. I discussed management of these with pads to relieve pressure and showed the patient examples.  2. Orthostatic dizziness We discussed leaving her amlodipine off, as her blood pressure looks good today. However, I do feel the lisinopril gives her renal protection related to her diabetes, so will resume this, but monitor for any further orthostasis.  3. Type 2 diabetes mellitus with retinopathy, without long-term current use of insulin, macular edema presence unspecified, unspecified laterality, unspecified retinopathy severity (HCC) She will have a HbA1c and BMP today, as previously ordered by Dr. Doreene Burke.  4. Essential hypertension As noted above.  - lisinopril  (ZESTRIL) 40 MG tablet; Take 1 tablet (40 mg total) by mouth at bedtime.  Dispense: 90 tablet; Refill: 3  We did discuss Cologuard use. Current recommendations are screening every 3 years.  Loyola Mast, MD

## 2020-05-15 LAB — HEMOGLOBIN A1C: Hgb A1c MFr Bld: 7.2 % — ABNORMAL HIGH (ref 4.6–6.5)

## 2020-05-15 LAB — BASIC METABOLIC PANEL
BUN: 15 mg/dL (ref 6–23)
CO2: 29 mEq/L (ref 19–32)
Calcium: 9 mg/dL (ref 8.4–10.5)
Chloride: 102 mEq/L (ref 96–112)
Creatinine, Ser: 0.74 mg/dL (ref 0.40–1.20)
GFR: 84.58 mL/min (ref >60.00)
Glucose, Bld: 133 mg/dL — ABNORMAL HIGH (ref 70–99)
Potassium: 3.9 mEq/L (ref 3.5–5.1)
Sodium: 141 mEq/L (ref 135–145)

## 2020-05-16 ENCOUNTER — Telehealth: Payer: Self-pay | Admitting: Family Medicine

## 2020-05-16 ENCOUNTER — Telehealth: Payer: Self-pay | Admitting: Endocrinology

## 2020-05-16 DIAGNOSIS — E119 Type 2 diabetes mellitus without complications: Secondary | ICD-10-CM

## 2020-05-16 NOTE — Telephone Encounter (Signed)
Pt called because Dr asked her to get her labs done but when she went to her PCP she got her labs done. Pt is wanting to know if she still needs her labs done here or if she needs to keep the appt because it will be testing for something different?  Please Advise  [phone]

## 2020-05-16 NOTE — Telephone Encounter (Signed)
Patient is calling to get her lab results (once reviewed). Please call her at [phone].

## 2020-05-16 NOTE — Telephone Encounter (Signed)
Please advise. BMP and A1C were completed.

## 2020-05-17 MED ORDER — RYBELSUS 14 MG PO TABS: 1.0000 | ORAL_TABLET | Freq: Every day | ORAL | 5 refills | Status: DC

## 2020-05-17 NOTE — Addendum Note (Signed)
Addended by: Loyola Mast on: 05/17/2020 05:03 PM   Modules accepted: Orders

## 2020-05-17 NOTE — Telephone Encounter (Signed)
She will not need to have labs repeated

## 2020-05-17 NOTE — Telephone Encounter (Signed)
Spoke with patient who verbally understood lab results. Patient willing to start on additional diabetes medication. Please advise.

## 2020-05-17 NOTE — Telephone Encounter (Signed)
Patient calling for lab results. Informed patient that doctor have not had a chance to view labs just yet but she will receive a call once they are viewed.

## 2020-05-18 NOTE — Telephone Encounter (Signed)
Patient have been notified. Lab appointment canceled. Remind patient follow up appointment on 06/06/2020

## 2020-05-21 ENCOUNTER — Other Ambulatory Visit: Payer: Self-pay

## 2020-05-21 ENCOUNTER — Ambulatory Visit (INDEPENDENT_AMBULATORY_CARE_PROVIDER_SITE_OTHER): Payer: Medicare PPO | Admitting: Ophthalmology

## 2020-05-21 ENCOUNTER — Encounter (INDEPENDENT_AMBULATORY_CARE_PROVIDER_SITE_OTHER): Payer: Self-pay | Admitting: Ophthalmology

## 2020-05-21 DIAGNOSIS — E113411 Type 2 diabetes mellitus with severe nonproliferative diabetic retinopathy with macular edema, right eye: Secondary | ICD-10-CM

## 2020-05-21 MED ORDER — DEXAMETHASONE 0.7 MG IZ IMPL
0.7000 mg | DRUG_IMPLANT | INTRAVITREAL | Status: AC | PRN
  Administered 2020-05-21: .7 mg via INTRAVITREAL

## 2020-05-21 NOTE — Assessment & Plan Note (Addendum)
Massive clinically significant macular edema OD with some elements and peculiar miliary aneurysms that look like adult Coats disease however it is steroid sensitive. At this lesion on the right eye with massive CME, CSME has not been responsive to antivegF therapy.  Repeat Ozurdex today and then recommend complete "wall-to-wall PRP" to decrease overall ophthalmic vegF load in this right eye  Today I reviewed with the patient the location of the macula, and the relative size of the macula to the remainder of the peripheral retina and the need for using wall-to-wall PRP. I gave her demonstrable photograph pictures from other patients of what that looks like.  She understands this and attempt to preserve visual functioning

## 2020-05-21 NOTE — Telephone Encounter (Signed)
Spoke with patient who states that she have not picked up Rx just yet she would like to wait until she see's Dr. Lucianne Muss and then she would let us know if she will start on new meds

## 2020-05-21 NOTE — Patient Instructions (Signed)
Patient asked to contact the office promptly for new onset visual acuity declines or distortions. 

## 2020-05-21 NOTE — Progress Notes (Signed)
05/21/2020     CHIEF COMPLAINT Patient presents for Retina Follow Up (2 Week NPDR f\u OD. Ozurdex OD. OCT/Pt states vision is the same. Denies complaints./BGL: did not check)   HISTORY OF PRESENT ILLNESS: Marie [name] is a 65 y.o. female who presents to the clinic today for:   HPI    Retina Follow Up    Patient presents with  Diabetic Retinopathy.  In right eye.  Severity is severe.  Duration of 2 weeks.  Since onset it is stable.  I, the attending physician,  performed the HPI with the patient and updated documentation appropriately. Additional comments: 2 Week NPDR f\u OD. Ozurdex OD. OCT Pt states vision is the same. Denies complaints. BGL: did not check       Last edited by Elyse Jarvis on 05/21/2020  2:12 PM. (History)      Referring physician: Mliss Sax, MD [address] [place],  [place] [address]  HISTORICAL INFORMATION:   Selected notes from the MEDICAL RECORD NUMBER    Lab Results  Component Value Date   HGBA1C 7.2 (H) 05/14/2020     CURRENT MEDICATIONS: No current outpatient medications on file. (Ophthalmic Drugs)   No current facility-administered medications for this visit. (Ophthalmic Drugs)   Current Outpatient Medications (Other)  Medication Sig  . aspirin EC 81 MG tablet Take 81 mg by mouth at bedtime.  [name] atorvastatin (LIPITOR) 40 MG tablet Take 1 tablet (40 mg total) by mouth daily.  . Cholecalciferol (VITAMIN D3) 3000 units TABS Take 3,000 Units by mouth at bedtime.  . dapagliflozin propanediol (FARXIGA) 10 MG TABS tablet Take 10 mg by mouth daily.  . Ginkgo Biloba 500 MG CAPS Take 500 mg by mouth at bedtime.   [name] glucose blood test strip 1 each by Other route as needed for other. Use as instructed (Patient not taking: No sig reported)  . lisinopril (ZESTRIL) 40 MG tablet Take 1 tablet (40 mg total) by mouth at bedtime.  . metFORMIN (GLUCOPHAGE-XR) 500 MG 24 hr tablet Take 2 tablets (1,000 mg total) by mouth in the morning and  at bedtime.  . Multiple Vitamin (MULTIVITAMIN WITH MINERALS) TABS tablet Take 1 tablet by mouth daily.  . Semaglutide (RYBELSUS) 14 MG TABS Take 1 tablet by mouth daily.   No current facility-administered medications for this visit. (Other)      REVIEW OF SYSTEMS: ROS    Positive for: Endocrine   Last edited by Elyse Jarvis on 05/21/2020  2:12 PM. (History)       ALLERGIES Allergies  Allergen Reactions  . Penicillins Other (See Comments)    Driving couldn't see and head felt heavy  Has patient had a PCN reaction causing immediate rash, facial/tongue/throat swelling, SOB or lightheadedness with hypotension: No Has patient had a PCN reaction causing severe rash involving mucus membranes or skin necrosis: No Has patient had a PCN reaction that required hospitalization: No Has patient had a PCN reaction occurring within the last 10 years: No If all of the above answers are "NO", then may proceed with Cephalosporin use.   . Other Other (See Comments)    Allergic to beef and eggplant causes rash on skin    PAST MEDICAL HISTORY Past Medical History:  Diagnosis Date  . CVA (cerebral vascular accident) (HCC) 2015  . Depression   . Diabetes (HCC)    t2  . Gallstones   . Hyperlipemia   . Hypertension   . Migraines  Past Surgical History:  Procedure Laterality Date  . BREAST BIOPSY Right 05/2019  . CESAREAN SECTION     x2  . CHOLECYSTECTOMY N/A 05/25/2016   Procedure: LAPAROSCOPIC CHOLECYSTECTOMY WITH INTRAOPERATIVE CHOLANGIOGRAM;  Surgeon: Luretha Murphy, MD;  Location: [facility] ORS;  Service: General;  Laterality: N/A;  . IR GENERIC HISTORICAL  03/13/2016   IR PERC CHOLECYSTOSTOMY 03/13/2016 [name], MD [facility]INTER[facility] RAD  . IR GENERIC HISTORICAL  04/28/2016   IR RADIOLOGIST EVAL & MGMT 04/28/2016 [name], MD [facility] INTER[facility] RAD  . LOOP RECORDER IMPLANT N/A 11/29/2013   Procedure: LOOP RECORDER IMPLANT;  Surgeon: Marinus Maw, MD;  Location: [facility] CATH LAB;  Service:  Cardiovascular;  Laterality: N/A;  . TEE WITHOUT CARDIOVERSION N/A 08/24/2013   Procedure: TRANSESOPHAGEAL ECHOCARDIOGRAM (TEE);  Surgeon: [name], MD;  Location: [facility] ENDOSCOPY;  Service: Cardiovascular;  Laterality: N/A;    FAMILY HISTORY Family History  Problem Relation Age of Onset  . Stomach cancer Mother   . High blood pressure Mother   . Diabetes Mother   . Diabetes Maternal Grandfather   . High blood pressure Maternal Grandfather   . Heart disease Neg Hx     SOCIAL HISTORY Social History   Tobacco Use  . Smoking status: Never Smoker  . Smokeless tobacco: Never Used  Vaping Use  . Vaping Use: Never used  Substance Use Topics  . Alcohol use: No    Alcohol/week: 1.0 standard drink    Types: 1 Standard drinks or equivalent per week    Comment: Social  . Drug use: No         OPHTHALMIC EXAM: Base Eye Exam    Visual Acuity (Snellen - Linear)      Right Left   Dist [place] E Card @ 5' 20/20   Dist ph [place] 20/400        Tonometry (Tonopen, 2:16 PM)      Right Left   Pressure 18 17       Neuro/Psych    Oriented x3: Yes   Mood/Affect: Normal       Dilation    Right eye: 1.0% Mydriacyl, 2.5% Phenylephrine @ 2:16 PM        Slit Lamp and Fundus Exam    External Exam      Right Left   External Normal Normal       Slit Lamp Exam      Right Left   Lids/Lashes Normal Normal   Conjunctiva/Sclera White and quiet White and quiet   Cornea Clear Clear   Anterior Chamber Deep and quiet Deep and quiet   Iris Round and reactive Round and reactive   Lens Posterior chamber intraocular lens, 1+ haze 2+ Nuclear sclerosis, Cortical cataract   Anterior Vitreous Normal Normal       Fundus Exam      Right Left   Posterior Vitreous Posterior vitreous detachment, Central vitreous floaters    Disc Normal    C/D Ratio 0.5    Macula Focal laser scars,   Macular thickening, Microaneurysms, Severe clinically significant macular edema    Vessels Retinopathy,, severe     Periphery Normal, good prp, room 360 for more laser in the future, posteriorly, and nasally           IMAGING AND PROCEDURES  Imaging and Procedures for 05/21/20  OCT, Retina - OU - Both Eyes       Right Eye Quality was good. Scan locations included subfoveal. Central Foveal Thickness: 703. Progression has  worsened.   Left Eye Quality was good. Scan locations included subfoveal. Central Foveal Thickness: 298. Progression has been stable. Findings include normal foveal contour.   Notes Massive CSME recurs OD, now 3 months post injection Ozurdex.  This means we will need to repeat the Ozurdex OD to improve the CSME and also to protect the macular from complete "wall-to-wall" and retinal photocoagulation for the severe retinal nonperfusion present in this right eye   Ozurdex OD today                 ASSESSMENT/PLAN:  Severe nonproliferative diabetic retinopathy of right eye, with macular edema, associated with type 2 diabetes mellitus (HCC) Massive clinically significant macular edema OD with some elements and peculiar miliary aneurysms that look like adult Coats disease however it is steroid sensitive. At this lesion on the right eye with massive CME, CSME has not been responsive to antivegF therapy.  Repeat Ozurdex today and then recommend complete "wall-to-wall PRP" to decrease overall ophthalmic vegF load in this right eye  Today I reviewed with the patient the location of the macula, and the relative size of the macula to the remainder of the peripheral retina and the need for using wall-to-wall PRP. I gave her demonstrable photograph pictures from other patients of what that looks like.  She understands this and attempt to preserve visual functioning      ICD-10-CM   1. Severe nonproliferative diabetic retinopathy of right eye, with macular edema, associated with type 2 diabetes mellitus (HCC)  E11.3411 OCT, Retina - OU - Both Eyes    1. The right eye prepped  and draped for injection intravitreal Ozurdex today.  2. Plan is return visit in 3 weeks for OCT, dilate right eye and PRP OD  3.  Ophthalmic Meds Ordered this visit:  No orders of the defined types were placed in this encounter.      Return in about 3 weeks (around 06/11/2020) for dilate, OD, PRP, OCT.  There are no Patient Instructions on file for this visit.   Explained the diagnoses, plan, and follow up with the patient and they expressed understanding.  Patient expressed understanding of the importance of proper follow up care.   Alford Highland Ltanya Bayley M.D. Diseases & Surgery of the Retina and Vitreous [facility] 05/21/20     Abbreviations: M myopia (nearsighted); A astigmatism; H hyperopia (farsighted); P presbyopia; Mrx spectacle prescription;  CTL contact lenses; OD right eye; OS left eye; OU both eyes  XT exotropia; ET esotropia; PEK punctate epithelial keratitis; PEE punctate epithelial erosions; DES dry eye syndrome; MGD meibomian gland dysfunction; ATs artificial tears; PFAT's preservative free artificial tears; NSC nuclear sclerotic cataract; PSC posterior subcapsular cataract; ERM epi-retinal membrane; PVD posterior vitreous detachment; RD retinal detachment; DM diabetes mellitus; DR diabetic retinopathy; NPDR non-proliferative diabetic retinopathy; PDR proliferative diabetic retinopathy; CSME clinically significant macular edema; DME diabetic macular edema; dbh dot blot hemorrhages; CWS cotton wool spot; POAG primary open angle glaucoma; C/D cup-to-disc ratio; HVF humphrey visual field; GVF goldmann visual field; OCT optical coherence tomography; IOP intraocular pressure; BRVO Branch retinal vein occlusion; CRVO central retinal vein occlusion; CRAO central retinal artery occlusion; BRAO branch retinal artery occlusion; RT retinal tear; SB scleral buckle; PPV pars plana vitrectomy; VH Vitreous hemorrhage; PRP panretinal laser photocoagulation; IVK intravitreal  kenalog; VMT vitreomacular traction; MH Macular hole;  NVD neovascularization of the disc; NVE neovascularization elsewhere; AREDS age related eye disease study; ARMD age related macular degeneration; POAG primary open  angle glaucoma; EBMD epithelial/anterior basement membrane dystrophy; ACIOL anterior chamber intraocular lens; IOL intraocular lens; PCIOL posterior chamber intraocular lens; Phaco/IOL phacoemulsification with intraocular lens placement; [place] photorefractive keratectomy; LASIK laser assisted in situ keratomileusis; HTN hypertension; DM diabetes mellitus; COPD chronic obstructive pulmonary disease

## 2020-05-30 ENCOUNTER — Other Ambulatory Visit: Payer: Medicare Other

## 2020-06-06 ENCOUNTER — Other Ambulatory Visit: Payer: Self-pay

## 2020-06-06 ENCOUNTER — Encounter: Payer: Self-pay | Admitting: Endocrinology

## 2020-06-06 ENCOUNTER — Ambulatory Visit: Payer: Medicare Other | Admitting: Endocrinology

## 2020-06-06 VITALS — BP 134/86 | HR 86 | Ht 62.5 in | Wt 126.0 lb

## 2020-06-06 DIAGNOSIS — E1165 Type 2 diabetes mellitus with hyperglycemia: Secondary | ICD-10-CM | POA: Diagnosis not present

## 2020-06-06 LAB — POCT GLUCOSE (DEVICE FOR HOME USE): POC Glucose: 102 mg/dl — AB (ref 70–99)

## 2020-06-06 MED ORDER — RYBELSUS 7 MG PO TABS: 1.0000 | ORAL_TABLET | Freq: Every day | ORAL | 3 refills | Status: DC

## 2020-06-06 MED ORDER — FREESTYLE LIBRE 2 SENSOR MISC: 2.0000 | 3 refills | Status: DC

## 2020-06-06 NOTE — Patient Instructions (Addendum)
Check blood sugars on waking up 7  days a week  Also check blood sugars about 2 hours after meals and do this after different meals by rotation  Recommended blood sugar levels on waking up are 90-130 and about 2 hours after meal is 130-160  Please bring your blood sugar monitor to each visit, thank you  Take Farxiga in am   Metformin 500mg  in am and 1000mg  in pm daily AFTER 1 WEEK GO TO 100 0MG  TWICE DAILY

## 2020-06-06 NOTE — Progress Notes (Signed)
Patient ID: Marie [name], female   DOB: Jun 26, 1955, 65 y.o.   MRN: [id number]           Reason for Appointment: for Type 2 Diabetes   History of Present Illness:          Date of diagnosis of type 2 diabetes mellitus: 2010       Background history:   She was likely treated with Metformin alone for the first few years and subsequently given Januvia In 2018 she was also given [place] in addition Current records indicate her highest A1c was 9.6 in 03/2017.  Has been progressively improving since then She was also started on Rybelsus in 02/2019  Recent history:   A1c is now 7.2 compared to 6.3  Non-insulin hypoglycemic drugs the patient is taking are: Metformin 0.5 g twice daily, Farxiga 10 mg daily, Rybelsus 7 mg daily  Current management, blood sugar patterns and problems identified:  She was last seen in 11/21  At that time she was having decreased appetite and was concerned about the cost of Rybelsus  She was told to try 3 mg of the Rybelsus; however she is still taking 7 mg  She does not think she has any anorexia and her weight has come up 2 pounds  She usually does not check her blood sugars with fingersticks and only started using the freestyle libre on Sunday using her phone  However blood sugars at this have been quite erratic starting with 337 initially and as low as 66  Blood sugars apparently running around 200 on Tuesday all day but yesterday they have been only between 130-150 and now in the office is only 102  She is taking her FARXIGA in the evening instead of morning  She apparently is only taking 500 mg metformin twice daily, has not taken higher doses  She does try to do exercise on her home machines as much as possible at least 15 minutes almost daily  She thinks she is eating some protein with every meal like chicken or meat         Side effects from medications have been: Decreased appetite from Rybelsus   Typical meal intake: Breakfast is egg,  toast .              Exercise:  Exercise equipment, possibly elliptical, using for 15 min daily   Glucose monitoring:  Using freestyle libre now    Blood Glucose readings as above, recent average 160   Dietician visit, most recent: 11/14  Weight history:   Wt Readings from Last 3 Encounters:  06/06/20 126 lb (57.2 kg)  05/14/20 125 lb 12.8 oz (57.1 kg)  02/08/20 124 lb (56.2 kg)    Glycemic control:   Lab Results  Component Value Date   HGBA1C 7.2 (H) 05/14/2020   HGBA1C 6.3 (A) 02/08/2020   HGBA1C 7.2 (H) 10/27/2019   Lab Results  Component Value Date   MICROALBUR 1.0 10/27/2019   LDLCALC 76 10/27/2019   CREATININE 0.74 05/14/2020   Lab Results  Component Value Date   MICRALBCREAT 1.2 10/27/2019    No results found for: FRUCTOSAMINE  Office Visit on 06/06/2020  Component Date Value Ref Range Status   POC Glucose 06/06/2020 102* 70 - 99 mg/dl Final    Allergies as of 06/06/2020      Reactions   Penicillins Other (See Comments)   Driving couldn't see and head felt heavy  Has patient had a PCN reaction causing immediate rash, facial/tongue/throat swelling,  SOB or lightheadedness with hypotension: No Has patient had a PCN reaction causing severe rash involving mucus membranes or skin necrosis: No Has patient had a PCN reaction that required hospitalization: No Has patient had a PCN reaction occurring within the last 10 years: No If all of the above answers are "NO", then may proceed with Cephalosporin use.   Other Other (See Comments)   Allergic to beef and eggplant causes rash on skin      Medication List       Accurate as of June 06, 2020 11:59 PM. If you have any questions, ask your nurse or doctor.        aspirin EC 81 MG tablet Take 81 mg by mouth at bedtime.   atorvastatin 40 MG tablet Commonly known as: LIPITOR Take 1 tablet (40 mg total) by mouth daily.   Farxiga 10 MG Tabs tablet Generic drug: dapagliflozin propanediol Take 10 mg by mouth  daily.   FreeStyle Libre 2 Sensor Misc 2 Devices by Does not apply route every 14 (fourteen) days. Started by: Reather Littler, MD   Ginkgo Biloba 500 MG Caps Take 500 mg by mouth at bedtime.   glucose blood test strip 1 each by Other route as needed for other. Use as instructed   lisinopril 40 MG tablet Commonly known as: ZESTRIL Take 1 tablet (40 mg total) by mouth at bedtime.   metFORMIN 500 MG 24 hr tablet Commonly known as: GLUCOPHAGE-XR Take 2 tablets (1,000 mg total) by mouth in the morning and at bedtime.   multivitamin with minerals Tabs tablet Take 1 tablet by mouth daily.   Rybelsus 7 MG Tabs Generic drug: Semaglutide Take 1 tablet by mouth daily before breakfast. Take 30 minutes before breakfast with water What changed:   medication strength  when to take this  additional instructions Changed by: Reather Littler, MD   Vitamin D3 75 MCG (3000 UT) Tabs Take 3,000 Units by mouth at bedtime.       Allergies:  Allergies  Allergen Reactions   Penicillins Other (See Comments)    Driving couldn't see and head felt heavy  Has patient had a PCN reaction causing immediate rash, facial/tongue/throat swelling, SOB or lightheadedness with hypotension: No Has patient had a PCN reaction causing severe rash involving mucus membranes or skin necrosis: No Has patient had a PCN reaction that required hospitalization: No Has patient had a PCN reaction occurring within the last 10 years: No If all of the above answers are "NO", then may proceed with Cephalosporin use.    Other Other (See Comments)    Allergic to beef and eggplant causes rash on skin    Past Medical History:  Diagnosis Date   CVA (cerebral vascular accident) (HCC) 2015   Depression    Diabetes (HCC)    t2   Gallstones    Hyperlipemia    Hypertension    Migraines     Past Surgical History:  Procedure Laterality Date   BREAST BIOPSY Right 05/2019   CESAREAN SECTION     x2    CHOLECYSTECTOMY N/A 05/25/2016   Procedure: LAPAROSCOPIC CHOLECYSTECTOMY WITH INTRAOPERATIVE CHOLANGIOGRAM;  Surgeon: Luretha Murphy, MD;  Location: [facility] ORS;  Service: General;  Laterality: N/A;   IR GENERIC HISTORICAL  03/13/2016   IR PERC CHOLECYSTOSTOMY 03/13/2016 [name], MD [facility]INTER[facility] RAD   IR GENERIC HISTORICAL  04/28/2016   IR RADIOLOGIST EVAL & MGMT 04/28/2016 [name], MD [facility] INTER[facility] RAD   LOOP RECORDER IMPLANT N/A 11/29/2013  Procedure: LOOP RECORDER IMPLANT;  Surgeon: Marinus Maw, MD;  Location: [facility] CATH LAB;  Service: Cardiovascular;  Laterality: N/A;   TEE WITHOUT CARDIOVERSION N/A 08/24/2013   Procedure: TRANSESOPHAGEAL ECHOCARDIOGRAM (TEE);  Surgeon: [name], MD;  Location: [facility] ENDOSCOPY;  Service: Cardiovascular;  Laterality: N/A;    Family History  Problem Relation Age of Onset   Stomach cancer Mother    High blood pressure Mother    Diabetes Mother    Diabetes Maternal Grandfather    High blood pressure Maternal Grandfather    Heart disease Neg Hx     Social History:  reports that she has never smoked. She has never used smokeless tobacco. She reports that she does not drink alcohol and does not use drugs.   Review of Systems    Lipid history: Cholesterol well controlled with atorvastatin 40 mg    Lab Results  Component Value Date   CHOL 150 10/27/2019   HDL 45.60 10/27/2019   LDLCALC 76 10/27/2019   TRIG 141.0 10/27/2019   CHOLHDL 3 10/27/2019           Hypertension: Has been present and treated with lisinopril 40 mg and amlodipine 5 mg Followed by PCP  BP Readings from Last 3 Encounters:  06/06/20 134/86  05/14/20 138/76  02/08/20 120/70    Most recent eye exam was in 11/21  Most recent foot exam: 11/21  Currently known complications of diabetes: Retinopathy with decreased vision on the right Normal microalbumin in 7/21  History of CVA with left facial numbness and speech difficulty  LABS:  Office Visit on  06/06/2020  Component Date Value Ref Range Status   POC Glucose 06/06/2020 102* 70 - 99 mg/dl Final    Physical Examination:  BP 134/86    Pulse 86    Ht 5' 2.5" (1.588 m)    Wt 126 lb (57.2 kg)    SpO2 97%    BMI 22.68 kg/m          ASSESSMENT:  Diabetes type 2  See history of present illness for detailed discussion of current diabetes management, blood sugar patterns and problems identified  A1c is 7.2  Current treatment regimen is Metformin, Farxiga and Rybelsus 7 mg  Not clear if she has consistent hyperglycemia as her recent freestyle libre data is inconsistent However today it appears to be relatively accurate and her blood sugar is 102 in the office before dinner She reports fairly good regimen of exercise and appears to be getting some protein with every meal  Previously had decreased appetite but not clear if this was related to Rybelsus, currently tolerating 7 mg    PLAN:    Glucose monitoring: She can continue the freestyle libre and if she prefers to use the freestyle reader she can do that, may need help from the customer support to start this if needed   Therapy changes:  INCREASE Metformin to 3 tablets daily and if tolerated after 1 week go to 4 tablets  Continue Rybelsus 7 mg in the morning and again discussed how this should be taken  Change Farxiga to the morning instead of evening  Make sure she has some protein with every meal  Follow-up in 6 weeks    Patient Instructions  Check blood sugars on waking up 7  days a week  Also check blood sugars about 2 hours after meals and do this after different meals by rotation  Recommended blood sugar levels on waking up are 90-130 and about  2 hours after meal is 130-160  Please bring your blood sugar monitor to each visit, thank you  Take Farxiga in am   Metformin 500mg  in am and 1000mg  in pm daily AFTER 1 WEEK GO TO 100 0MG  TWICE DAILY         Reather LittlerAjay Townes Fuhs 06/07/2020, 8:09 AM   Note: This  office note was prepared with Dragon voice recognition system technology. Any transcriptional errors that result from this process are unintentional.

## 2020-06-11 ENCOUNTER — Encounter (INDEPENDENT_AMBULATORY_CARE_PROVIDER_SITE_OTHER): Payer: Medicare PPO | Admitting: Ophthalmology

## 2020-06-12 ENCOUNTER — Telehealth: Payer: Self-pay | Admitting: Endocrinology

## 2020-06-12 ENCOUNTER — Other Ambulatory Visit: Payer: Self-pay | Admitting: *Deleted

## 2020-06-12 DIAGNOSIS — E1165 Type 2 diabetes mellitus with hyperglycemia: Secondary | ICD-10-CM

## 2020-06-12 MED ORDER — FREESTYLE LIBRE 2 SENSOR MISC: 2.0000 | 3 refills | Status: DC

## 2020-06-12 NOTE — Telephone Encounter (Signed)
If she has received sensors from [facility] then we will need to get the fax from them to order

## 2020-06-12 NOTE — Telephone Encounter (Signed)
Pt called back with some concerns about the process for getting her Freestyle [place] 2 sensors. She says she is very confused about this process because she keeps calling people and getting different information. Pt called [facility] prior, and they told her to reach out to her Dr to send the prescription and that her Dr needs to call them at [phone]. Pt just wanted to relay the message they told her.

## 2020-06-12 NOTE — Telephone Encounter (Signed)
Pt stated verified used [facility]. Print Rx libre 2 sensor and sent to Dr. Lucianne Muss table for signature.

## 2020-06-12 NOTE — Telephone Encounter (Signed)
Please advise below message 

## 2020-06-12 NOTE — Telephone Encounter (Signed)
Patient requests to be called at ph# [phone] re: status PA for [employer] 2 Sensors for [employer] told Patient that they have sent PA request for the above with no response). Patient states she has changed insurance from [facility] to [facility].

## 2020-06-12 NOTE — Telephone Encounter (Signed)
LVM-- to call the office back regarding freestyle libre 2.

## 2020-06-13 ENCOUNTER — Ambulatory Visit (INDEPENDENT_AMBULATORY_CARE_PROVIDER_SITE_OTHER): Payer: Medicare Other | Admitting: Ophthalmology

## 2020-06-13 ENCOUNTER — Other Ambulatory Visit: Payer: Self-pay

## 2020-06-13 ENCOUNTER — Ambulatory Visit: Payer: Medicare PPO | Admitting: Family Medicine

## 2020-06-13 ENCOUNTER — Encounter (INDEPENDENT_AMBULATORY_CARE_PROVIDER_SITE_OTHER): Payer: Self-pay | Admitting: Ophthalmology

## 2020-06-13 DIAGNOSIS — E113411 Type 2 diabetes mellitus with severe nonproliferative diabetic retinopathy with macular edema, right eye: Secondary | ICD-10-CM

## 2020-06-13 NOTE — Assessment & Plan Note (Signed)
Massive CSME resistant in the past to all antivegF agents now again sensitive and responsive to Ozurdex implant.  I spent some time explaining to the patient a prior to laser PRP that with improvement in macular edema to this extent, decreasing the amount of disease in the eye by laser photocoagulation to decrease the vegF release is the best chance at preventing recurrence of this type of diffuse macular edema.

## 2020-06-13 NOTE — Progress Notes (Signed)
06/13/2020     CHIEF COMPLAINT Patient presents for Retina Follow Up (3 Wk PRP OD//Pt sts she feels [facility] OD has improved since last visit, and sts she is able to see things now with OD. No new symptoms reported./LBS: 153 average over last 24 hours)   HISTORY OF PRESENT ILLNESS: Marie [name] is a 65 y.o. female who presents to the clinic today for:   HPI    Retina Follow Up    Patient presents with  Diabetic Retinopathy.  In right eye.  This started 3 weeks ago.  Severity is moderate.  Duration of 3 weeks.  Since onset it is gradually improving. Additional comments: 3 Wk PRP OD  Pt sts she feels VA OD has improved since last visit, and sts she is able to see things now with OD. No new symptoms reported. LBS: 153 average over last 24 hours       Last edited by Ileana Roup, COA on 06/13/2020  2:54 PM. (History)      Referring physician: Mliss Sax, MD [address] [place],  [place] [address]  HISTORICAL INFORMATION:   Selected notes from the MEDICAL RECORD NUMBER    Lab Results  Component Value Date   HGBA1C 7.2 (H) 05/14/2020     CURRENT MEDICATIONS: No current outpatient medications on file. (Ophthalmic Drugs)   No current facility-administered medications for this visit. (Ophthalmic Drugs)   Current Outpatient Medications (Other)  Medication Sig  . aspirin EC 81 MG tablet Take 81 mg by mouth at bedtime.  [name] atorvastatin (LIPITOR) 40 MG tablet Take 1 tablet (40 mg total) by mouth daily.  . Cholecalciferol (VITAMIN D3) 3000 units TABS Take 3,000 Units by mouth at bedtime.  . Continuous Blood Gluc Sensor (FREESTYLE LIBRE 2 SENSOR) MISC 2 Devices by Does not apply route every 14 (fourteen) days.  . dapagliflozin propanediol (FARXIGA) 10 MG TABS tablet Take 10 mg by mouth daily.  . Ginkgo Biloba 500 MG CAPS Take 500 mg by mouth at bedtime.   [name] glucose blood test strip 1 each by Other route as needed for other. Use as instructed  . lisinopril (ZESTRIL)  40 MG tablet Take 1 tablet (40 mg total) by mouth at bedtime.  . metFORMIN (GLUCOPHAGE-XR) 500 MG 24 hr tablet Take 2 tablets (1,000 mg total) by mouth in the morning and at bedtime.  . Multiple Vitamin (MULTIVITAMIN WITH MINERALS) TABS tablet Take 1 tablet by mouth daily.  . Semaglutide (RYBELSUS) 7 MG TABS Take 1 tablet by mouth daily before breakfast. Take 30 minutes before breakfast with water   No current facility-administered medications for this visit. (Other)      REVIEW OF SYSTEMS:    ALLERGIES Allergies  Allergen Reactions  . Penicillins Other (See Comments)    Driving couldn't see and head felt heavy  Has patient had a PCN reaction causing immediate rash, facial/tongue/throat swelling, SOB or lightheadedness with hypotension: No Has patient had a PCN reaction causing severe rash involving mucus membranes or skin necrosis: No Has patient had a PCN reaction that required hospitalization: No Has patient had a PCN reaction occurring within the last 10 years: No If all of the above answers are "NO", then may proceed with Cephalosporin use.   . Other Other (See Comments)    Allergic to beef and eggplant causes rash on skin    PAST MEDICAL HISTORY Past Medical History:  Diagnosis Date  . CVA (cerebral vascular accident) (HCC) 2015  . Depression   .  Diabetes (HCC)    t2  . Gallstones   . Hyperlipemia   . Hypertension   . Migraines    Past Surgical History:  Procedure Laterality Date  . BREAST BIOPSY Right 05/2019  . CESAREAN SECTION     x2  . CHOLECYSTECTOMY N/A 05/25/2016   Procedure: LAPAROSCOPIC CHOLECYSTECTOMY WITH INTRAOPERATIVE CHOLANGIOGRAM;  Surgeon: Luretha Murphy, MD;  Location: [facility];  Service: General;  Laterality: N/A;  . IR GENERIC HISTORICAL  03/13/2016   IR PERC CHOLECYSTOSTOMY 03/13/2016 [name], MD [facility]  . IR GENERIC HISTORICAL  04/28/2016   IR RADIOLOGIST EVAL & MGMT 04/28/2016 [name], MD [facility] INTER[facility] RAD  . LOOP RECORDER  IMPLANT N/A 11/29/2013   Procedure: LOOP RECORDER IMPLANT;  Surgeon: Marinus Maw, MD;  Location: [facility] CATH LAB;  Service: Cardiovascular;  Laterality: N/A;  . TEE WITHOUT CARDIOVERSION N/A 08/24/2013   Procedure: TRANSESOPHAGEAL ECHOCARDIOGRAM (TEE);  Surgeon: [name], MD;  Location: [facility] ENDOSCOPY;  Service: Cardiovascular;  Laterality: N/A;    FAMILY HISTORY Family History  Problem Relation Age of Onset  . Stomach cancer Mother   . High blood pressure Mother   . Diabetes Mother   . Diabetes Maternal Grandfather   . High blood pressure Maternal Grandfather   . Heart disease Neg Hx     SOCIAL HISTORY Social History   Tobacco Use  . Smoking status: Never Smoker  . Smokeless tobacco: Never Used  Vaping Use  . Vaping Use: Never used  Substance Use Topics  . Alcohol use: No    Alcohol/week: 1.0 standard drink    Types: 1 Standard drinks or equivalent per week    Comment: Social  . Drug use: No         OPHTHALMIC EXAM: Base Eye Exam    Visual Acuity (ETDRS)      Right Left   Dist [place] 20/200 -1 20/20 -1   Dist ph  20/80 -3        Tonometry (Tonopen, 2:53 PM)      Right Left   Pressure 15 13       Pupils      Pupils Dark Light Shape React APD   Right PERRL 6 5 Round Brisk None   Left PERRL 6 5 Round Brisk None       Visual Fields (Counting fingers)      Left Right    Full Full       Extraocular Movement      Right Left    Full Full       Neuro/Psych    Oriented x3: Yes   Mood/Affect: Normal       Dilation    Right eye: 1.0% Mydriacyl, 2.5% Phenylephrine @ 2:58 PM          IMAGING AND PROCEDURES  Imaging and Procedures for 06/13/20  OCT, Retina - OU - Both Eyes       Right Eye Quality was good. Scan locations included subfoveal. Central Foveal Thickness: 239.   Left Eye Quality was good. Scan locations included subfoveal. Central Foveal Thickness: 301.   Notes Vastly improved center involved CSME steroid responsive secondary to  recent Ozurdex injection OD.  We will need to complete aggressive PRP peripherally today to decrease vegF load in an attempt to prevent recurrence of CSME OD       Panretinal Photocoagulation - OD - Right Eye       Anesthesia Topical anesthesia was used. Anesthetic medications included Proparacaine 0.5%.  Laser Information The type of laser was diode. Color was yellow. The duration in seconds was 0.04. The spot size was 390 microns. Laser power was 320. Total spots was 1354.   Post-op The patient tolerated the procedure well. There were no complications. The patient received written and verbal post procedure care education.   Notes Therapy delivered nasal, inferonasal and inferiorly OD                ASSESSMENT/PLAN:  Severe nonproliferative diabetic retinopathy of right eye, with macular edema, associated with type 2 diabetes mellitus (HCC) Massive CSME resistant in the past to all antivegF agents now again sensitive and responsive to Ozurdex implant.  I spent some time explaining to the patient a prior to laser PRP that with improvement in macular edema to this extent, decreasing the amount of disease in the eye by laser photocoagulation to decrease the vegF release is the best chance at preventing recurrence of this type of diffuse macular edema.        ICD-10-CM   1. Severe nonproliferative diabetic retinopathy of right eye, with macular edema, associated with type 2 diabetes mellitus (HCC)  E11.3411 OCT, Retina - OU - Both Eyes    Panretinal Photocoagulation - OD - Right Eye    1.  PRP applied today OD to decrease vegF load  2.  Dilate next.  May consider additional PRP temporal quadrants  3.  Given 2 Tylenol extra strength today for moderate discomfort at and around the time of laser delivery  Ophthalmic Meds Ordered this visit:  No orders of the defined types were placed in this encounter.      Return in about 6 weeks (around 07/25/2020) for DILATE OU,  COLOR FP.  There are no Patient Instructions on file for this visit.   Explained the diagnoses, plan, and follow up with the patient and they expressed understanding.  Patient expressed understanding of the importance of proper follow up care.   Alford Highland Rankin M.D. Diseases & Surgery of the Retina and Vitreous [facility] 06/13/20     Abbreviations: M myopia (nearsighted); A astigmatism; H hyperopia (farsighted); P presbyopia; Mrx spectacle prescription;  CTL contact lenses; OD right eye; OS left eye; OU both eyes  XT exotropia; ET esotropia; PEK punctate epithelial keratitis; PEE punctate epithelial erosions; DES dry eye syndrome; MGD meibomian gland dysfunction; ATs artificial tears; PFAT's preservative free artificial tears; NSC nuclear sclerotic cataract; PSC posterior subcapsular cataract; ERM epi-retinal membrane; PVD posterior vitreous detachment; RD retinal detachment; DM diabetes mellitus; DR diabetic retinopathy; NPDR non-proliferative diabetic retinopathy; PDR proliferative diabetic retinopathy; CSME clinically significant macular edema; DME diabetic macular edema; dbh dot blot hemorrhages; CWS cotton wool spot; POAG primary open angle glaucoma; C/D cup-to-disc ratio; HVF humphrey visual field; GVF goldmann visual field; OCT optical coherence tomography; IOP intraocular pressure; BRVO Branch retinal vein occlusion; CRVO central retinal vein occlusion; CRAO central retinal artery occlusion; BRAO branch retinal artery occlusion; RT retinal tear; SB scleral buckle; PPV pars plana vitrectomy; VH Vitreous hemorrhage; PRP panretinal laser photocoagulation; IVK intravitreal kenalog; VMT vitreomacular traction; MH Macular hole;  NVD neovascularization of the disc; NVE neovascularization elsewhere; AREDS age related eye disease study; ARMD age related macular degeneration; POAG primary open angle glaucoma; EBMD epithelial/anterior basement membrane dystrophy; ACIOL anterior chamber  intraocular lens; IOL intraocular lens; PCIOL posterior chamber intraocular lens; Phaco/IOL phacoemulsification with intraocular lens placement; PRK photorefractive keratectomy; LASIK laser assisted in situ keratomileusis; HTN hypertension; DM diabetes mellitus; COPD chronic  obstructive pulmonary disease

## 2020-06-14 ENCOUNTER — Telehealth: Payer: Self-pay | Admitting: Family Medicine

## 2020-06-14 DIAGNOSIS — R262 Difficulty in walking, not elsewhere classified: Secondary | ICD-10-CM

## 2020-06-14 NOTE — Telephone Encounter (Signed)
Pt calling to request getting a walker because when she goes to the bathroom at night she has trouble getting up. She also said when she had her laser surgery for her right eye it went bad, but she said The doctor she saw took get care of her. She has trouble walking without help

## 2020-06-14 NOTE — Telephone Encounter (Signed)
Pt called to inform us that she still has not received her sensors. She called [facility] and they told her they still have not received anything from [place]. Pt just wanted to check the status.    Pt also wanted Dr Lucianne Muss to see how bad her eye appt with Dr.Rankin went. Pt states right eye is doing really bad. She can barely open her eyes, and can't walk without assistance.

## 2020-06-17 NOTE — Telephone Encounter (Signed)
Pt aware.

## 2020-06-17 NOTE — Telephone Encounter (Signed)
Pt requests a call from Dr.Kumar's nurse. She wants clarification on what is going on with her sensors. Pt also wants to make sure of a few things with the nurse.  Callback # [phone]

## 2020-06-17 NOTE — Telephone Encounter (Signed)
Rx libre sensor sent to [facility]--[id number].

## 2020-06-17 NOTE — Addendum Note (Signed)
Addended by: Lake Bells on: 06/17/2020 10:44 AM   Modules accepted: Orders

## 2020-06-17 NOTE — Telephone Encounter (Signed)
Called to inform patient that DME order was plaed for requested walker and someone would give her a call regarding this. No answer LM on VM asked patient to call back with any questions.

## 2020-06-17 NOTE — Telephone Encounter (Signed)
Pt called Marie [name] because she said she hasnt heard anything back, I advised her to wait for a call back from the nurse and that I can see where a message was sent back. She said if you cant get her to please leave a voicemail

## 2020-06-27 NOTE — Telephone Encounter (Signed)
Pt is calling in checking on status of this message. She still does not have her walker. She has an upcoming appointment with Dr. Doreene Burke on 07/04/20.

## 2020-06-28 NOTE — Addendum Note (Signed)
Addended by: Lake Bells on: 06/28/2020 10:22 AM   Modules accepted: Orders

## 2020-07-02 ENCOUNTER — Other Ambulatory Visit: Payer: Self-pay

## 2020-07-02 DIAGNOSIS — E119 Type 2 diabetes mellitus without complications: Secondary | ICD-10-CM | POA: Diagnosis not present

## 2020-07-02 DIAGNOSIS — E1142 Type 2 diabetes mellitus with diabetic polyneuropathy: Secondary | ICD-10-CM

## 2020-07-02 DIAGNOSIS — Z794 Long term (current) use of insulin: Secondary | ICD-10-CM | POA: Diagnosis not present

## 2020-07-02 DIAGNOSIS — R2681 Unsteadiness on feet: Secondary | ICD-10-CM

## 2020-07-03 DIAGNOSIS — E1140 Type 2 diabetes mellitus with diabetic neuropathy, unspecified: Secondary | ICD-10-CM | POA: Diagnosis not present

## 2020-07-03 DIAGNOSIS — R269 Unspecified abnormalities of gait and mobility: Secondary | ICD-10-CM | POA: Diagnosis not present

## 2020-07-04 ENCOUNTER — Encounter: Payer: Self-pay | Admitting: Family Medicine

## 2020-07-04 ENCOUNTER — Other Ambulatory Visit: Payer: Self-pay

## 2020-07-04 ENCOUNTER — Ambulatory Visit (INDEPENDENT_AMBULATORY_CARE_PROVIDER_SITE_OTHER): Payer: Medicare Other | Admitting: Family Medicine

## 2020-07-04 ENCOUNTER — Telehealth: Payer: Self-pay | Admitting: Cardiology

## 2020-07-04 VITALS — BP 138/82 | HR 93 | Temp 97.3°F | Wt 130.0 lb

## 2020-07-04 DIAGNOSIS — I10 Essential (primary) hypertension: Secondary | ICD-10-CM | POA: Diagnosis not present

## 2020-07-04 DIAGNOSIS — Z0000 Encounter for general adult medical examination without abnormal findings: Secondary | ICD-10-CM

## 2020-07-04 DIAGNOSIS — Z23 Encounter for immunization: Secondary | ICD-10-CM

## 2020-07-04 DIAGNOSIS — E7800 Pure hypercholesterolemia, unspecified: Secondary | ICD-10-CM | POA: Diagnosis not present

## 2020-07-04 NOTE — Progress Notes (Signed)
Established Patient Office Visit  Subjective:  Patient ID: Marie [name], female    DOB: 07-29-1955  Age: 65 y.o. MRN: [id number]  CC:  Chief Complaint  Patient presents with  . Annual Exam    Pt is here for physical exam,CPE, pt is not fasting, freestyle libre 2 is giving pt complications, pt states that her reading are both high and low, and when this happens she feels like something is forcing her down, pt states she fell a month losing her tooth, called emergency hotline, states they directed her to drink water to help lower Blood sugar. Current reading is 245mg /dL pt states her taste is now bland is concenred if medication is causing this     HPI Marie [name] presents for follow-up of hypertension and elevated cholesterol.  Continues to see endocrinology for diabetes.  Diabetes control is improving.  She shows me the results of her average blood sugar over the last week or so to be 149.  She reports compliance with her atorvastatin and lisinopril.  Unfortunately her vision continues to deteriorate.  She is the primary caregiver of her son who accompanies to her today.  He has special needs.  Past Medical History:  Diagnosis Date  . CVA (cerebral vascular accident) (HCC) 2015  . Depression   . Diabetes (HCC)    t2  . Gallstones   . Hyperlipemia   . Hypertension   . Migraines     Past Surgical History:  Procedure Laterality Date  . BREAST BIOPSY Right 05/2019  . CESAREAN SECTION     x2  . CHOLECYSTECTOMY N/A 05/25/2016   Procedure: LAPAROSCOPIC CHOLECYSTECTOMY WITH INTRAOPERATIVE CHOLANGIOGRAM;  Surgeon: 05/27/2016, MD;  Location: [facility];  Service: General;  Laterality: N/A;  . IR GENERIC HISTORICAL  03/13/2016   IR PERC CHOLECYSTOSTOMY 03/13/2016 14/11/2015, MD [facility]INTER[facility]  . IR GENERIC HISTORICAL  04/28/2016   IR RADIOLOGIST EVAL & MGMT 04/28/2016 04/30/2016, MD [facility]  . LOOP RECORDER IMPLANT N/A 11/29/2013   Procedure: LOOP RECORDER IMPLANT;  Surgeon:  12/01/2013, MD;  Location: [facility] CATH LAB;  Service: Cardiovascular;  Laterality: N/A;  . TEE WITHOUT CARDIOVERSION N/A 08/24/2013   Procedure: TRANSESOPHAGEAL ECHOCARDIOGRAM (TEE);  Surgeon: 08/26/2013, MD;  Location: [facility] ENDOSCOPY;  Service: Cardiovascular;  Laterality: N/A;    Family History  Problem Relation Age of Onset  . Stomach cancer Mother   . High blood pressure Mother   . Diabetes Mother   . Diabetes Maternal Grandfather   . High blood pressure Maternal Grandfather   . Heart disease Neg Hx     Social History   Socioeconomic History  . Marital status: Married    Spouse name: Not on file  . Number of children: 3  . Years of education: MBA  . Highest education level: Not on file  Occupational History    Employer: [employer]    Comment: [employer]  Tobacco Use  . Smoking status: Never Smoker  . Smokeless tobacco: Never Used  Vaping Use  . Vaping Use: Never used  Substance and Sexual Activity  . Alcohol use: No    Alcohol/week: 1.0 standard drink    Types: 1 Standard drinks or equivalent per week    Comment: Social  . Drug use: No  . Sexual activity: Not on file  Other Topics Concern  . Not on file  Social History Narrative   Patient lives at home with [name].   Patient works  full time [employer]     College education MBA   Right handed   Caffeine two cups daily.         Social Determinants of Health   Financial Resource Strain: Not on file  Food Insecurity: Not on file  Transportation Needs: Not on file  Physical Activity: Not on file  Stress: Not on file  Social Connections: Not on file  Intimate Partner Violence: Not on file    Outpatient Medications Prior to Visit  Medication Sig Dispense Refill  . aspirin EC 81 MG tablet Take 81 mg by mouth at bedtime.    [name]. atorvastatin (LIPITOR) 40 MG tablet Take 1 tablet (40 mg total) by mouth daily. 90 tablet 3  . Cholecalciferol (VITAMIN D3) 3000 units TABS Take 3,000 Units by  mouth at bedtime.    . Continuous Blood Gluc Sensor (FREESTYLE LIBRE 2 SENSOR) MISC 2 Devices by Does not apply route every 14 (fourteen) days. 6 each 3  . dapagliflozin propanediol (FARXIGA) 10 MG TABS tablet Take 10 mg by mouth daily. 90 tablet 3  . Ginkgo Biloba 500 MG CAPS Take 500 mg by mouth at bedtime.     [name]. glucose blood test strip 1 each by Other route as needed for other. Use as instructed    . lisinopril (ZESTRIL) 40 MG tablet Take 1 tablet (40 mg total) by mouth at bedtime. 90 tablet 3  . metFORMIN (GLUCOPHAGE-XR) 500 MG 24 hr tablet Take 2 tablets (1,000 mg total) by mouth in the morning and at bedtime. 360 tablet 3  . Multiple Vitamin (MULTIVITAMIN WITH MINERALS) TABS tablet Take 1 tablet by mouth daily.    . Semaglutide (RYBELSUS) 7 MG TABS Take 1 tablet by mouth daily before breakfast. Take 30 minutes before breakfast with water 30 tablet 3   No facility-administered medications prior to visit.    Allergies  Allergen Reactions  . Penicillins Other (See Comments)    Driving couldn't see and head felt heavy  Has patient had a PCN reaction causing immediate rash, facial/tongue/throat swelling, SOB or lightheadedness with hypotension: No Has patient had a PCN reaction causing severe rash involving mucus membranes or skin necrosis: No Has patient had a PCN reaction that required hospitalization: No Has patient had a PCN reaction occurring within the last 10 years: No If all of the above answers are "NO", then may proceed with Cephalosporin use.     ROS Review of Systems  Constitutional: Negative.   HENT: Negative.   Eyes: Positive for visual disturbance. Negative for photophobia.  Respiratory: Negative.   Cardiovascular: Negative.   Gastrointestinal: Negative.  Negative for abdominal pain, anal bleeding, blood in stool, constipation and diarrhea.  Endocrine: Negative for polyphagia and polyuria.  Genitourinary: Negative for difficulty urinating, dysuria and frequency.   Skin: Negative for pallor and rash.  Neurological: Negative for speech difficulty and weakness.  Psychiatric/Behavioral: Negative.       Objective:    Physical Exam Vitals and nursing note reviewed.  Constitutional:      General: She is not in acute distress.    Appearance: Normal appearance. She is not ill-appearing, toxic-appearing or diaphoretic.  HENT:     Head: Normocephalic and atraumatic.     Right Ear: Tympanic membrane, ear canal and external ear normal.     Left Ear: Tympanic membrane, ear canal and external ear normal.     Mouth/Throat:     Mouth: Mucous membranes are moist.     Pharynx: Oropharynx  is clear. No oropharyngeal exudate or posterior oropharyngeal erythema.  Eyes:     General: No scleral icterus.    Extraocular Movements: Extraocular movements intact.     Conjunctiva/sclera: Conjunctivae normal.     Pupils: Pupils are equal, round, and reactive to light.  Cardiovascular:     Rate and Rhythm: Normal rate and regular rhythm.  Pulmonary:     Effort: Pulmonary effort is normal.     Breath sounds: Normal breath sounds.  Abdominal:     General: Abdomen is flat. Bowel sounds are normal. There is no distension.     Palpations: Abdomen is soft. There is no mass.     Tenderness: There is no abdominal tenderness. There is no guarding or rebound.     Hernia: No hernia is present.  Musculoskeletal:     Cervical back: No rigidity or tenderness.     Right lower leg: No edema.     Left lower leg: No edema.  Lymphadenopathy:     Cervical: No cervical adenopathy.  Skin:    General: Skin is warm and dry.     Coloration: Skin is not jaundiced.  Neurological:     Mental Status: She is alert and oriented to person, place, and time.  Psychiatric:        Mood and Affect: Mood normal.        Behavior: Behavior normal.     BP 138/82 (BP Location: Left Arm, Patient Position: Sitting, Cuff Size: Normal)   Pulse 93   Temp (!) 97.3 F (36.3 C) (Temporal)   Wt 130 lb  (59 kg)   SpO2 98%   BMI 23.40 kg/m  Wt Readings from Last 3 Encounters:  07/04/20 130 lb (59 kg)  06/06/20 126 lb (57.2 kg)  05/14/20 125 lb 12.8 oz (57.1 kg)     Health Maintenance Due  Topic Date Due  . COLONOSCOPY (Pts 45-37yrs Insurance coverage will need to be confirmed)  Never done  . DEXA SCAN  Never done  . PNA vac Low Risk Adult (1 of 2 - PCV13) Never done    There are no preventive care reminders to display for this patient.  Lab Results  Component Value Date   TSH 2.42 10/13/2018   Lab Results  Component Value Date   WBC 6.0 10/27/2019   HGB 13.6 10/27/2019   HCT 40.9 10/27/2019   MCV 87.0 10/27/2019   PLT 220.0 10/27/2019   Lab Results  Component Value Date   NA 141 05/14/2020   K 3.9 05/14/2020   CO2 29 05/14/2020   GLUCOSE 133 (H) 05/14/2020   BUN 15 05/14/2020   CREATININE 0.74 05/14/2020   BILITOT 0.4 10/27/2019   ALKPHOS 76 10/27/2019   AST 20 10/27/2019   ALT 26 10/27/2019   PROT 6.5 10/27/2019   ALBUMIN 4.1 10/27/2019   CALCIUM 9.0 05/14/2020   ANIONGAP 11 05/21/2016   GFR 84.58 05/14/2020   Lab Results  Component Value Date   CHOL 150 10/27/2019   Lab Results  Component Value Date   HDL 45.60 10/27/2019   Lab Results  Component Value Date   LDLCALC 76 10/27/2019   Lab Results  Component Value Date   TRIG 141.0 10/27/2019   Lab Results  Component Value Date   CHOLHDL 3 10/27/2019   Lab Results  Component Value Date   HGBA1C 7.2 (H) 05/14/2020      Assessment & Plan:   Problem List Items Addressed This Visit  Cardiovascular and Mediastinum   Essential hypertension - Primary   Relevant Orders   CBC   Comprehensive metabolic panel    Other Visit Diagnoses    Elevated LDL cholesterol level       Relevant Orders   Comprehensive metabolic panel   LDL cholesterol, direct   Healthcare maintenance       Relevant Orders   Ambulatory referral to Gastroenterology   DG Bone Density      No orders of the  defined types were placed in this encounter.   Follow-up: Return in about 6 months (around 01/03/2021).  Stressed the importance of compliance with her medications.  Given information on preventative maintenance and managing high cholesterol.  She was given a pneumococcal 23 vaccine today.  Follow-up with the Prevnar 13 in 1 year.  Mliss Sax, MD

## 2020-07-04 NOTE — Addendum Note (Signed)
Addended by: Varney Biles on: 07/04/2020 11:45 AM   Modules accepted: Orders

## 2020-07-04 NOTE — Telephone Encounter (Signed)
CALLED LEFT MESSAGE TO CALL BACK - DO  NOT KNOW WHAT SIGNS AND SYMPTOMS PATIENT IS SPEAKING OF -  ? CVA OR HEART DISEASE.  ASK FOR PATIENT TO CALL BACK AND ASK TO SPEAK TO ANY TRIAGE NURSE.

## 2020-07-04 NOTE — Patient Instructions (Addendum)
Health Maintenance After Age 66 After age 64, you are at a higher risk for certain long-term diseases and infections as well as injuries from falls. Falls are a major cause of broken bones and head injuries in people who are older than age 41. Getting regular preventive care can help to keep you healthy and well. Preventive care includes getting regular testing and making lifestyle changes as recommended by your health care provider. Talk with your health care provider about:  Which screenings and tests you should have. A screening is a test that checks for a disease when you have no symptoms.  A diet and exercise plan that is right for you. What should I know about screenings and tests to prevent falls? Screening and testing are the best ways to find a health problem early. Early diagnosis and treatment give you the best chance of managing medical conditions that are common after age 19. Certain conditions and lifestyle choices may make you more likely to have a fall. Your health care provider may recommend:  Regular vision checks. Poor vision and conditions such as cataracts can make you more likely to have a fall. If you wear glasses, make sure to get your prescription updated if your vision changes.  Medicine review. Work with your health care provider to regularly review all of the medicines you are taking, including over-the-counter medicines. Ask your health care provider about any side effects that may make you more likely to have a fall. Tell your health care provider if any medicines that you take make you feel dizzy or sleepy.  Osteoporosis screening. Osteoporosis is a condition that causes the bones to get weaker. This can make the bones weak and cause them to break more easily.  Blood pressure screening. Blood pressure changes and medicines to control blood pressure can make you feel dizzy.  Strength and balance checks. Your health care provider may recommend certain tests to check your  strength and balance while standing, walking, or changing positions.  Foot health exam. Foot pain and numbness, as well as not wearing proper footwear, can make you more likely to have a fall.  Depression screening. You may be more likely to have a fall if you have a fear of falling, feel emotionally low, or feel unable to do activities that you used to do.  Alcohol use screening. Using too much alcohol can affect your balance and may make you more likely to have a fall. What actions can I take to lower my risk of falls? General instructions  Talk with your health care provider about your risks for falling. Tell your health care provider if: ? You fall. Be sure to tell your health care provider about all falls, even ones that seem minor. ? You feel dizzy, sleepy, or off-balance.  Take over-the-counter and prescription medicines only as told by your health care provider. These include any supplements.  Eat a healthy diet and maintain a healthy weight. A healthy diet includes low-fat dairy products, low-fat (lean) meats, and fiber from whole grains, beans, and lots of fruits and vegetables. Home safety  Remove any tripping hazards, such as rugs, cords, and clutter.  Install safety equipment such as grab bars in bathrooms and safety rails on stairs.  Keep rooms and walkways well-lit. Activity  Follow a regular exercise program to stay fit. This will help you maintain your balance. Ask your health care provider what types of exercise are appropriate for you.  If you need a cane or walker,  use it as recommended by your health care provider.  Wear supportive shoes that have nonskid soles.   Lifestyle  Do not drink alcohol if your health care provider tells you not to drink.  If you drink alcohol, limit how much you have: ? 0-1 drink a day for women. ? 0-2 drinks a day for men.  Be aware of how much alcohol is in your drink. In the [place] one drink equals one typical bottle of beer (12  oz), one-half glass of wine (5 oz), or one shot of hard liquor (1 oz).  Do not use any products that contain nicotine or tobacco, such as cigarettes and e-cigarettes. If you need help quitting, ask your health care provider. Summary  Having a healthy lifestyle and getting preventive care can help to protect your health and wellness after age 47.  Screening and testing are the best way to find a health problem early and help you avoid having a fall. Early diagnosis and treatment give you the best chance for managing medical conditions that are more common for people who are older than age 60.  Falls are a major cause of broken bones and head injuries in people who are older than age 30. Take precautions to prevent a fall at home.  Work with your health care provider to learn what changes you can make to improve your health and wellness and to prevent falls. This information is not intended to replace advice given to you by your health care provider. Make sure you discuss any questions you have with your health care provider. Document Revised: 07/14/2018 Document Reviewed: 02/03/2017 Elsevier Patient Education  2021 Elsevier Inc.  Preventing High Cholesterol Cholesterol is a white, waxy substance similar to fat that the human body needs to help build cells. The liver makes all the cholesterol that a person's body needs. Having high cholesterol (hypercholesterolemia) increases your risk for heart disease and stroke. Extra or excess cholesterol comes from the food that you eat. High cholesterol can often be prevented with diet and lifestyle changes. If you already have high cholesterol, you can control it with diet, lifestyle changes, and medicines. How can high cholesterol affect me? If you have high cholesterol, fatty deposits (plaques) may build up on the walls of your blood vessels. The blood vessels that carry blood away from your heart are called arteries. Plaques make the arteries narrower  and stiffer. This in turn can:  Restrict or block blood flow and cause blood clots to form.  Increase your risk for heart attack and stroke. What can increase my risk for high cholesterol? This condition is more likely to develop in people who:  Eat foods that are high in saturated fat or cholesterol. Saturated fat is mostly found in foods that come from animal sources.  Are overweight.  Are not getting enough exercise.  Have a family history of high cholesterol (familial hypercholesterolemia). What actions can I take to prevent this? Nutrition  Eat less saturated fat.  Avoid trans fats (partially hydrogenated oils). These are often found in margarine and in some baked goods, fried foods, and snacks bought in packages.  Avoid precooked or cured meat, such as bacon, sausages, or meat loaves.  Avoid foods and drinks that have added sugars.  Eat more fruits, vegetables, and whole grains.  Choose healthy sources of protein, such as fish, poultry, lean cuts of red meat, beans, peas, lentils, and nuts.  Choose healthy sources of fat, such as: ? Nuts. ? Vegetable oils,  especially olive oil. ? Fish that have healthy fats, such as omega-3 fatty acids. These fish include mackerel or salmon.   Lifestyle  Lose weight if you are overweight. Maintaining a healthy body mass index (BMI) can help prevent or control high cholesterol. It can also lower your risk for diabetes and high blood pressure. Ask your health care provider to help you with a diet and exercise plan to lose weight safely.  Do not use any products that contain nicotine or tobacco, such as cigarettes, e-cigarettes, and chewing tobacco. If you need help quitting, ask your health care provider. Alcohol use  Do not drink alcohol if: ? Your health care provider tells you not to drink. ? You are pregnant, may be pregnant, or are planning to become pregnant.  If you drink alcohol: ? Limit how much you use to:  0-1 drink a  day for women.  0-2 drinks a day for men. ? Be aware of how much alcohol is in your drink. In the [place] one drink equals one 12 oz bottle of beer (355 mL), one 5 oz glass of wine (148 mL), or one 1 oz glass of hard liquor (44 mL). Activity  Get enough exercise. Do exercises as told by your health care provider.  Each week, do at least 150 minutes of exercise that takes a medium level of effort (moderate-intensity exercise). This kind of exercise: ? Makes your heart beat faster while allowing you to still be able to talk. ? Can be done in short sessions several times a day or longer sessions a few times a week. For example, on 5 days each week, you could walk fast or ride your bike 3 times a day for 10 minutes each time.   Medicines  Your health care provider may recommend medicines to help lower cholesterol. This may be a medicine to lower the amount of cholesterol that your liver makes. You may need medicine if: ? Diet and lifestyle changes have not lowered your cholesterol enough. ? You have high cholesterol and other risk factors for heart disease or stroke.  Take over-the-counter and prescription medicines only as told by your health care provider. General information  Manage your risk factors for high cholesterol. Talk with your health care provider about all your risk factors and how to lower your risk.  Manage other conditions that you have, such as diabetes or high blood pressure (hypertension).  Have blood tests to check your cholesterol levels at regular points in time as told by your health care provider.  Keep all follow-up visits as told by your health care provider. This is important. Where to find more information  American Heart Association: www.heart.org  National Heart, Lung, and Blood Institute: [employer].is Summary  High cholesterol increases your risk for heart disease and stroke. By keeping your cholesterol level low, you can reduce your risk for these  conditions.  High cholesterol can often be prevented with diet and lifestyle changes.  Work with your health care provider to manage your risk factors, and have your blood tested regularly. This information is not intended to replace advice given to you by your health care provider. Make sure you discuss any questions you have with your health care provider. Document Revised: 01/03/2019 Document Reviewed: 01/03/2019 Elsevier Patient Education  2021 [employer].

## 2020-07-04 NOTE — Telephone Encounter (Signed)
Patient states she lives alone and wants to know what signs and symptoms to look out for. She states she would also like to know if she would need an echo.

## 2020-07-05 ENCOUNTER — Other Ambulatory Visit: Payer: Self-pay | Admitting: Endocrinology

## 2020-07-15 ENCOUNTER — Telehealth: Payer: Self-pay | Admitting: Family Medicine

## 2020-07-15 NOTE — Telephone Encounter (Signed)
Pt received letter from [facility] and said it had been 3 years since her lasts exam and told her to call her pcp so that her pcp can get her setup for her next one

## 2020-07-16 NOTE — Telephone Encounter (Signed)
Spoke with patient form mailed to for patient to sign and send back so that we can fax off for patient to receive kit.

## 2020-07-19 NOTE — Telephone Encounter (Signed)
Pt calling stating she wanted to know when Dr. Cristal Deer wanted to see her back and what signs should she look for with a MI.  Pt informed per chart review Dr. Cristal Deer recommended 1 year follow up. Appointment scheduled for 11/04/20. Pt also provided a brief description of signs and symptoms of a MI but informed her that everyone is different and to report to ER if she develops any symptoms.

## 2020-07-22 ENCOUNTER — Other Ambulatory Visit: Payer: Medicare Other

## 2020-07-23 ENCOUNTER — Encounter (INDEPENDENT_AMBULATORY_CARE_PROVIDER_SITE_OTHER): Payer: Medicare Other | Admitting: Ophthalmology

## 2020-07-24 ENCOUNTER — Other Ambulatory Visit: Payer: Self-pay

## 2020-07-24 ENCOUNTER — Ambulatory Visit (INDEPENDENT_AMBULATORY_CARE_PROVIDER_SITE_OTHER): Payer: Medicare Other | Admitting: Ophthalmology

## 2020-07-24 ENCOUNTER — Encounter (INDEPENDENT_AMBULATORY_CARE_PROVIDER_SITE_OTHER): Payer: Self-pay | Admitting: Ophthalmology

## 2020-07-24 DIAGNOSIS — E113411 Type 2 diabetes mellitus with severe nonproliferative diabetic retinopathy with macular edema, right eye: Secondary | ICD-10-CM

## 2020-07-24 DIAGNOSIS — H2512 Age-related nuclear cataract, left eye: Secondary | ICD-10-CM | POA: Diagnosis not present

## 2020-07-24 DIAGNOSIS — E113492 Type 2 diabetes mellitus with severe nonproliferative diabetic retinopathy without macular edema, left eye: Secondary | ICD-10-CM | POA: Diagnosis not present

## 2020-07-24 NOTE — Progress Notes (Signed)
07/24/2020     CHIEF COMPLAINT Patient presents for Retina Follow Up (6 Wk F/U OU//Pt sts VA has improved OD, and she is no longer seeing floaters "hanging down" in [place]. Stable VA OS. //LBS: 127 this AM)   HISTORY OF PRESENT ILLNESS: Marie [name] is a 65 y.o. female who presents to the clinic today for:   HPI    Retina Follow Up    Patient presents with  Diabetic Retinopathy.  In right eye.  This started 6 weeks ago.  Severity is mild.  Duration of 6 weeks.  Since onset it is gradually improving. Additional comments: 6 Wk F/U OU  Pt sts VA has improved OD, and she is no longer seeing floaters "hanging down" in [place]. Stable VA OS.   LBS: 127 this AM       Last edited by Ileana Roup, COA on 07/24/2020  1:54 PM. (History)      Referring physician: Mliss Sax, MD [address] [place],  [place] [address]  HISTORICAL INFORMATION:   Selected notes from the MEDICAL RECORD NUMBER    Lab Results  Component Value Date   HGBA1C 7.2 (H) 05/14/2020     CURRENT MEDICATIONS: No current outpatient medications on file. (Ophthalmic Drugs)   No current facility-administered medications for this visit. (Ophthalmic Drugs)   Current Outpatient Medications (Other)  Medication Sig  . aspirin EC 81 MG tablet Take 81 mg by mouth at bedtime.  [name] atorvastatin (LIPITOR) 40 MG tablet Take 1 tablet (40 mg total) by mouth daily.  . Cholecalciferol (VITAMIN D3) 3000 units TABS Take 3,000 Units by mouth at bedtime.  . Continuous Blood Gluc Sensor (FREESTYLE LIBRE 2 SENSOR) MISC 2 Devices by Does not apply route every 14 (fourteen) days.  . dapagliflozin propanediol (FARXIGA) 10 MG TABS tablet Take 10 mg by mouth daily.  . Ginkgo Biloba 500 MG CAPS Take 500 mg by mouth at bedtime.   [name] glucose blood test strip 1 each by Other route as needed for other. Use as instructed  . lisinopril (ZESTRIL) 40 MG tablet Take 1 tablet (40 mg total) by mouth at bedtime.  . metFORMIN (GLUCOPHAGE-XR)  500 MG 24 hr tablet Take 2 tablets (1,000 mg total) by mouth in the morning and at bedtime.  . Multiple Vitamin (MULTIVITAMIN WITH MINERALS) TABS tablet Take 1 tablet by mouth daily.  . RYBELSUS 7 MG TABS TAKE 1 TABLET BY MOUTH DAILY   No current facility-administered medications for this visit. (Other)      REVIEW OF SYSTEMS:    ALLERGIES Allergies  Allergen Reactions  . Penicillins Other (See Comments)    Driving couldn't see and head felt heavy  Has patient had a PCN reaction causing immediate rash, facial/tongue/throat swelling, SOB or lightheadedness with hypotension: No Has patient had a PCN reaction causing severe rash involving mucus membranes or skin necrosis: No Has patient had a PCN reaction that required hospitalization: No Has patient had a PCN reaction occurring within the last 10 years: No If all of the above answers are "NO", then may proceed with Cephalosporin use.     PAST MEDICAL HISTORY Past Medical History:  Diagnosis Date  . CVA (cerebral vascular accident) (HCC) 2015  . Depression   . Diabetes (HCC)    t2  . Gallstones   . Hyperlipemia   . Hypertension   . Migraines    Past Surgical History:  Procedure Laterality Date  . BREAST BIOPSY Right 05/2019  . CESAREAN  SECTION     x2  . CHOLECYSTECTOMY N/A 05/25/2016   Procedure: LAPAROSCOPIC CHOLECYSTECTOMY WITH INTRAOPERATIVE CHOLANGIOGRAM;  Surgeon: Luretha Murphy, MD;  Location: [facility] ORS;  Service: General;  Laterality: N/A;  . IR GENERIC HISTORICAL  03/13/2016   IR PERC CHOLECYSTOSTOMY 03/13/2016 [name], MD [facility]INTERV RAD  . IR GENERIC HISTORICAL  04/28/2016   IR RADIOLOGIST EVAL & MGMT 04/28/2016 [name], MD [facility] INTERV RAD  . LOOP RECORDER IMPLANT N/A 11/29/2013   Procedure: LOOP RECORDER IMPLANT;  Surgeon: Marinus Maw, MD;  Location: [facility] CATH LAB;  Service: Cardiovascular;  Laterality: N/A;  . TEE WITHOUT CARDIOVERSION N/A 08/24/2013   Procedure: TRANSESOPHAGEAL ECHOCARDIOGRAM (TEE);   Surgeon: [name], MD;  Location: [facility] ENDOSCOPY;  Service: Cardiovascular;  Laterality: N/A;    FAMILY HISTORY Family History  Problem Relation Age of Onset  . Stomach cancer Mother   . High blood pressure Mother   . Diabetes Mother   . Diabetes Maternal Grandfather   . High blood pressure Maternal Grandfather   . Heart disease Neg Hx     SOCIAL HISTORY Social History   Tobacco Use  . Smoking status: Never Smoker  . Smokeless tobacco: Never Used  Vaping Use  . Vaping Use: Never used  Substance Use Topics  . Alcohol use: No    Alcohol/week: 1.0 standard drink    Types: 1 Standard drinks or equivalent per week    Comment: Social  . Drug use: No         OPHTHALMIC EXAM: Base Eye Exam    Visual Acuity (ETDRS)      Right Left   Dist [place] 20/200 20/20 -1   Dist ph [place] 20/70 -1        Tonometry (Tonopen, 2:00 PM)      Right Left   Pressure 22 21       Pupils      Pupils Dark Light Shape React APD   Right PERRL 6 5 Round Brisk None   Left PERRL 6 5 Round Brisk None       Visual Fields (Counting fingers)      Left Right    Full Full       Extraocular Movement      Right Left    Full Full       Neuro/Psych    Oriented x3: Yes   Mood/Affect: Normal       Dilation    Both eyes: 1.0% Mydriacyl, 2.5% Phenylephrine @ 2:00 PM        Slit Lamp and Fundus Exam    External Exam      Right Left   External Normal Normal       Slit Lamp Exam      Right Left   Lids/Lashes Normal Normal   Conjunctiva/Sclera White and quiet White and quiet   Cornea Clear Clear   Anterior Chamber Deep and quiet Deep and quiet   Iris Round and reactive Round and reactive   Lens Posterior chamber intraocular lens, 1+ haze 2+ Nuclear sclerosis, Cortical cataract   Anterior Vitreous Normal Normal       Fundus Exam      Right Left   Posterior Vitreous Posterior vitreous detachment, Central vitreous floaters Normal   Disc Normal Normal   C/D Ratio 0.5 0.5-0.6   Macula  Focal laser scars,   much less macular thickening, Microaneurysms, much less Microaneurysms   Vessels Retinopathy,, severe NPDR-Severe   Periphery Normal, good prp, room  360 for more laser in the future, posteriorly, and nasally Normal          IMAGING AND PROCEDURES  Imaging and Procedures for 07/24/20  Color Fundus Photography Optos - OU - Both Eyes       Right Eye Progression has worsened. Disc findings include normal observations. Macula : edema, microaneurysms.   Left Eye Progression has been stable. Disc findings include normal observations. Macula : normal observations. Vessels : normal observations. Periphery : normal observations.   Notes Good PRP nearly 360, some small region inferonasal available for more laser but clear media at this time with quiet PDR less CSME  OS with severe NPDR                ASSESSMENT/PLAN:  Severe nonproliferative diabetic retinopathy of right eye, with macular edema, associated with type 2 diabetes mellitus (HCC) Good PRP delivered March 2022, with Ozurdex in place.  We will continue to monitor for decreasing vegF load in his eye with rather massive CSME responsive only to Ozurdex, and not any available of antivegF  Severe nonproliferative diabetic retinopathy of left eye (HCC) Stable over time  Nuclear sclerotic cataract of left eye Patient complains of some near vision fatigability with reading even with her glasses.  I explained to her that the functioning macula the left eye is the eye that affords her excellent opportunity to read thus likely the cataract is having some effect on this and she should follow-up with Dr. Racheal Patcheshristopher Grote at the time of her choosing      ICD-10-CM   1. Severe nonproliferative diabetic retinopathy of right eye, with macular edema, associated with type 2 diabetes mellitus (HCC)  [id number] Color Fundus Photography Optos - OU - Both Eyes  2. Severe nonproliferative diabetic retinopathy of left eye  without macular edema associated with type 2 diabetes mellitus (HCC)  [id number] Color Fundus Photography Optos - OU - Both Eyes  3. Nuclear sclerotic cataract of left eye  H25.12     1.  CSME OD continues to respond nicely to ongoing Ozurdex elution present from the vitreal insert, from February 2022.  This effect is likely to wane over the coming months, with improvement in vision in the right eye from count fingers now to 20/70 pinhole  2.  Good PRP aggressively delivered March 2022 to decrease vegF burden, we will await the macular response to this therapy in the coming weeks to months  3.  OS remained stable with excellent visual acuity  Ophthalmic Meds Ordered this visit:  No orders of the defined types were placed in this encounter.      Return in about 8 weeks (around 09/18/2020) for dilate, OD, COLOR FP, OCT.  There are no Patient Instructions on file for this visit.   Explained the diagnoses, plan, and follow up with the patient and they expressed understanding.  Patient expressed understanding of the importance of proper follow up care.   Alford HighlandGary A. Randol Zumstein M.D. Diseases & Surgery of the Retina and Vitreous [facility] 07/24/20     Abbreviations: M myopia (nearsighted); A astigmatism; H hyperopia (farsighted); P presbyopia; Mrx spectacle prescription;  CTL contact lenses; OD right eye; OS left eye; OU both eyes  XT exotropia; ET esotropia; PEK punctate epithelial keratitis; PEE punctate epithelial erosions; DES dry eye syndrome; MGD meibomian gland dysfunction; ATs artificial tears; PFAT's preservative free artificial tears; NSC nuclear sclerotic cataract; PSC posterior subcapsular cataract; ERM epi-retinal membrane; PVD posterior vitreous detachment; RD  retinal detachment; DM diabetes mellitus; DR diabetic retinopathy; NPDR non-proliferative diabetic retinopathy; PDR proliferative diabetic retinopathy; CSME clinically significant macular edema; DME diabetic  macular edema; dbh dot blot hemorrhages; CWS cotton wool spot; POAG primary open angle glaucoma; C/D cup-to-disc ratio; HVF humphrey visual field; GVF goldmann visual field; OCT optical coherence tomography; IOP intraocular pressure; BRVO Branch retinal vein occlusion; CRVO central retinal vein occlusion; CRAO central retinal artery occlusion; BRAO branch retinal artery occlusion; RT retinal tear; SB scleral buckle; PPV pars plana vitrectomy; VH Vitreous hemorrhage; PRP panretinal laser photocoagulation; IVK intravitreal kenalog; VMT vitreomacular traction; MH Macular hole;  NVD neovascularization of the disc; NVE neovascularization elsewhere; AREDS age related eye disease study; ARMD age related macular degeneration; POAG primary open angle glaucoma; EBMD epithelial/anterior basement membrane dystrophy; ACIOL anterior chamber intraocular lens; IOL intraocular lens; PCIOL posterior chamber intraocular lens; Phaco/IOL phacoemulsification with intraocular lens placement; PRK photorefractive keratectomy; LASIK laser assisted in situ keratomileusis; HTN hypertension; DM diabetes mellitus; COPD chronic obstructive pulmonary disease

## 2020-07-24 NOTE — Assessment & Plan Note (Signed)
Good PRP delivered March 2022, with Ozurdex in place.  We will continue to monitor for decreasing vegF load in his eye with rather massive CSME responsive only to Ozurdex, and not any available of antivegF

## 2020-07-24 NOTE — Assessment & Plan Note (Signed)
Stable over time. 

## 2020-07-24 NOTE — Assessment & Plan Note (Signed)
Patient complains of some near vision fatigability with reading even with her glasses.  I explained to her that the functioning macula the left eye is the eye that affords her excellent opportunity to read thus likely the cataract is having some effect on this and she should follow-up with Dr. Racheal Patches at the time of her choosing

## 2020-07-25 ENCOUNTER — Encounter: Payer: Self-pay | Admitting: Endocrinology

## 2020-07-25 ENCOUNTER — Ambulatory Visit (INDEPENDENT_AMBULATORY_CARE_PROVIDER_SITE_OTHER): Payer: Medicare Other | Admitting: Endocrinology

## 2020-07-25 VITALS — BP 122/78 | HR 79 | Ht 63.0 in | Wt 127.4 lb

## 2020-07-25 DIAGNOSIS — E1165 Type 2 diabetes mellitus with hyperglycemia: Secondary | ICD-10-CM

## 2020-07-25 NOTE — Progress Notes (Signed)
Patient ID: Marie [name], female   DOB: [birth year], 65 y.o.   MRN: [id number]           Reason for Appointment: for Type 2 Diabetes   History of Present Illness:          Date of diagnosis of type 2 diabetes mellitus: 2010       Background history:   She was likely treated with Metformin alone for the first few years and subsequently given Januvia In 2018 she was also given [place] in addition Current records indicate her highest A1c was 9.6 in 03/2017.  Has been progressively improving since then She was also started on Rybelsus in 02/2019  Recent history:   A1c is last 7.2 compared to 6.3  Non-insulin hypoglycemic drugs the patient is taking are: Metformin 0.5 g 3 daily, Farxiga 10 mg daily, Rybelsus 7 mg daily  Current management, blood sugar patterns and problems identified:  She did not complete her lab work and no assessment available for diabetes  However she appears to have more consistent and reliable results with her freestyle [name] now  Although previously she was getting some extra protein and glucose she is only eating 2 slices of bread and blood sugars are frequently high midmorning  She did increase her metformin to 3 pills a day but not to 4 as directed  Still concerned about her fasting readings being mostly high  Blood sugar readings and averages are discussed in the interpretation of her CGM below  Her weight is about the same as last month  No nausea or decreased appetite with Rybelsus 7 mg  She does try to do exercise on her home exercise improvement, usually over 15 minutes almost daily  She thinks she is eating some protein with lunch and dinner, usually chicken or meat         Side effects from medications have been: Decreased appetite from Rybelsus   Typical meal intake: Breakfast is egg, toast .              Exercise:  Exercise equipment, ? elliptical, using for 15 min daily   Glucose monitoring:  Using freestyle libre   Blood sugar  patterns from a download of the freestyle [name] is interpreted as follows   Blood sugars are mildly increased overall averaging 130-150 except after breakfast currently are as much as 195 average  Blood sugar fluctuation is more significant between 6 AM-11 AM and also mild variability in the late afternoon  Blood sugar data is somewhat incomplete after 7 PM until 11 PM  No hypoglycemia  Postprandial spikes are mostly ordering after breakfast and only occasionally mid afternoon  Overnight blood sugars are relatively stable especially after 2 AM but may be showing dawn phenomenon at times  CGM use % of time  67  2-week average/GV  158  Time in range    76    %  % Time Above 180  21+3  % Time above 250   % Time Below 70 0     PRE-MEAL Fasting Lunch Dinner Bedtime Overall  Glucose range:       Averages:  134  150  149   158   POST-MEAL PC Breakfast PC Lunch PC Dinner  Glucose range:     Averages:  195  149  159       Dietician visit, most recent: 11/14  Weight history:   Wt Readings from Last 3 Encounters:  07/25/20 127 lb 6.4 oz (57.8  kg)  07/04/20 130 lb (59 kg)  06/06/20 126 lb (57.2 kg)    Glycemic control:   Lab Results  Component Value Date   HGBA1C 7.2 (H) 05/14/2020   HGBA1C 6.3 (A) 02/08/2020   HGBA1C 7.2 (H) 10/27/2019   Lab Results  Component Value Date   MICROALBUR 1.0 10/27/2019   LDLCALC 76 10/27/2019   CREATININE 0.74 05/14/2020   Lab Results  Component Value Date   MICRALBCREAT 1.2 10/27/2019    No results found for: FRUCTOSAMINE  No visits with results within 1 Week(s) from this visit.  Latest known visit with results is:  Office Visit on 06/06/2020  Component Date Value Ref Range Status  . POC Glucose 06/06/2020 102* 70 - 99 mg/dl Final    Allergies as of 07/25/2020      Reactions   Penicillins Other (See Comments)   Driving couldn't see and head felt heavy  Has patient had a PCN reaction causing immediate rash,  facial/tongue/throat swelling, SOB or lightheadedness with hypotension: No Has patient had a PCN reaction causing severe rash involving mucus membranes or skin necrosis: No Has patient had a PCN reaction that required hospitalization: No Has patient had a PCN reaction occurring within the last 10 years: No If all of the above answers are "NO", then may proceed with Cephalosporin use.      Medication List       Accurate as of July 25, 2020  2:10 PM. If you have any questions, ask your nurse or doctor.        aspirin EC 81 MG tablet Take 81 mg by mouth at bedtime.   atorvastatin 40 MG tablet Commonly known as: LIPITOR Take 1 tablet (40 mg total) by mouth daily.   Farxiga 10 MG Tabs tablet Generic drug: dapagliflozin propanediol Take 10 mg by mouth daily.   FreeStyle Libre 2 Sensor Misc 2 Devices by Does not apply route every 14 (fourteen) days.   Ginkgo Biloba 500 MG Caps Take 500 mg by mouth at bedtime.   glucose blood test strip 1 each by Other route as needed for other. Use as instructed   lisinopril 40 MG tablet Commonly known as: ZESTRIL Take 1 tablet (40 mg total) by mouth at bedtime.   metFORMIN 500 MG 24 hr tablet Commonly known as: GLUCOPHAGE-XR Take 2 tablets (1,000 mg total) by mouth in the morning and at bedtime.   multivitamin with minerals Tabs tablet Take 1 tablet by mouth daily.   Rybelsus 7 MG Tabs Generic drug: Semaglutide TAKE 1 TABLET BY MOUTH DAILY   Vitamin D3 75 MCG (3000 UT) Tabs Take 3,000 Units by mouth at bedtime.       Allergies:  Allergies  Allergen Reactions  . Penicillins Other (See Comments)    Driving couldn't see and head felt heavy  Has patient had a PCN reaction causing immediate rash, facial/tongue/throat swelling, SOB or lightheadedness with hypotension: No Has patient had a PCN reaction causing severe rash involving mucus membranes or skin necrosis: No Has patient had a PCN reaction that required hospitalization:  No Has patient had a PCN reaction occurring within the last 10 years: No If all of the above answers are "NO", then may proceed with Cephalosporin use.     Past Medical History:  Diagnosis Date  . CVA (cerebral vascular accident) (HCC) 2015  . Depression   . Diabetes (HCC)    t2  . Gallstones   . Hyperlipemia   . Hypertension   .  Migraines     Past Surgical History:  Procedure Laterality Date  . BREAST BIOPSY Right 05/2019  . CESAREAN SECTION     x2  . CHOLECYSTECTOMY N/A 05/25/2016   Procedure: LAPAROSCOPIC CHOLECYSTECTOMY WITH INTRAOPERATIVE CHOLANGIOGRAM;  Surgeon: Luretha Murphy, MD;  Location: [facility] ORS;  Service: General;  Laterality: N/A;  . IR GENERIC HISTORICAL  03/13/2016   IR PERC CHOLECYSTOSTOMY 03/13/2016 [name], MD [facility]INTER[facility] RAD  . IR GENERIC HISTORICAL  04/28/2016   IR RADIOLOGIST EVAL & MGMT 04/28/2016 [name], MD [facility] INTER[facility] RAD  . LOOP RECORDER IMPLANT N/A 11/29/2013   Procedure: LOOP RECORDER IMPLANT;  Surgeon: Marinus Maw, MD;  Location: [facility] CATH LAB;  Service: Cardiovascular;  Laterality: N/A;  . TEE WITHOUT CARDIOVERSION N/A 08/24/2013   Procedure: TRANSESOPHAGEAL ECHOCARDIOGRAM (TEE);  Surgeon: [name], MD;  Location: [facility] ENDOSCOPY;  Service: Cardiovascular;  Laterality: N/A;    Family History  Problem Relation Age of Onset  . Stomach cancer Mother   . High blood pressure Mother   . Diabetes Mother   . Diabetes Maternal Grandfather   . High blood pressure Maternal Grandfather   . Heart disease Neg Hx     Social History:  reports that she has never smoked. She has never used smokeless tobacco. She reports that she does not drink alcohol and does not use drugs.   Review of Systems    Lipid history: LDL well controlled with atorvastatin 40 mg    Lab Results  Component Value Date   CHOL 150 10/27/2019   HDL 45.60 10/27/2019   LDLCALC 76 10/27/2019   TRIG 141.0 10/27/2019   CHOLHDL 3 10/27/2019           Hypertension: Has  been present and treated with lisinopril 40 mg and amlodipine 5 mg Followed by PCP  BP Readings from Last 3 Encounters:  07/25/20 122/78  07/04/20 138/82  06/06/20 134/86    Most recent eye exam was in 11/21.  Has retinopathy  Most recent foot exam: 11/21  Currently known complications of diabetes: Retinopathy with decreased vision on the right Normal microalbumin in 7/21  History of CVA with left facial numbness and speech difficulty  LABS:  No visits with results within 1 Week(s) from this visit.  Latest known visit with results is:  Office Visit on 06/06/2020  Component Date Value Ref Range Status  . POC Glucose 06/06/2020 102* 70 - 99 mg/dl Final    Physical Examination:  BP 122/78   Pulse 79   Ht 5\' 3"  (1.6 m)   Wt 127 lb 6.4 oz (57.8 kg)   SpO2 99%   BMI 22.57 kg/m      Exam not indicated     ASSESSMENT:  Diabetes type 2  See history of present illness for detailed discussion of current diabetes management, blood sugar patterns and problems identified  A1c is last 7.2  Current treatment regimen is Metformin, Farxiga 10 mg and Rybelsus 7 mg  She now has a freestyle libre used fairly consistently with more reliable blood sugar information However has not checked readings after dinner usually and only 67% of data is available for the last 2 weeks With eating no protein with her carbohydrate intake at breakfast blood sugars are the highest in the morning and frequently over 180 Usually not having high readings after lunch or dinner  She does have previous information from dietitian but may not be always following. Her weight is stable and he she is not having  any side effects from any medication including nausea or weight loss now    PLAN:    . She will try to check her blood sugars consistently after dinner also . INCREASE Metformin ER to 2 tablets twice daily . She will call if she has any diarrhea with this . Continue Rybelsus 7 mg in the morning  at least 30 minutes before breakfast . Marcelline Deist to be taken in the morning at breakfast . Make sure she has some protein such as eggs, [place] yogurt or cheese with breakfast and may cut back on portion of carbohydrate . Continue regular exercise  A1c in 2 months    There are no Patient Instructions on file for this visit.      Reather Littler 07/25/2020, 2:10 PM   Note: This office note was prepared with Dragon voice recognition system technology. Any transcriptional errors that result from this process are unintentional.

## 2020-07-25 NOTE — Patient Instructions (Addendum)
Take 2 of Metformin 500  at Breakfast and 2 at dinner  Check blood sugars on waking up 7 days a week  Also check blood sugars about 2 hours after meals and do this after different meals by rotation  Recommended blood sugar levels on waking up are 90-130 and about 2 hours after meal is 130-180  Please bring your blood sugar monitor to each visit, thank you

## 2020-07-29 ENCOUNTER — Ambulatory Visit (HOSPITAL_BASED_OUTPATIENT_CLINIC_OR_DEPARTMENT_OTHER): Payer: Medicare Other

## 2020-08-13 ENCOUNTER — Ambulatory Visit: Payer: Medicare Other

## 2020-08-20 ENCOUNTER — Ambulatory Visit: Payer: Medicare Other

## 2020-08-20 NOTE — Progress Notes (Deleted)
Subjective:   Anh Bigos is a 65 y.o. female who presents for an Initial Medicare Annual Wellness Visit.  Review of Systems    ***       Objective:    There were no vitals filed for this visit. There is no height or weight on file to calculate BMI.  Advanced Directives 05/25/2016 05/21/2016 03/13/2016 03/08/2016 01/23/2015 11/29/2013 08/24/2013  Does Patient Have a Medical Advance Directive? No No No No No No Patient does not have advance directive  Would patient like information on creating a medical advance directive? No - Patient declined No - Patient declined No - Patient declined - - Yes - Transport planner given -  Pre-existing out of facility DNR order (yellow form or pink MOST form) - - - - - - No    Current Medications (verified) Outpatient Encounter Medications as of 08/20/2020  Medication Sig  . aspirin EC 81 MG tablet Take 81 mg by mouth at bedtime.  [name] atorvastatin (LIPITOR) 40 MG tablet Take 1 tablet (40 mg total) by mouth daily.  . Cholecalciferol (VITAMIN D3) 3000 units TABS Take 3,000 Units by mouth at bedtime.  . Continuous Blood Gluc Sensor (FREESTYLE LIBRE 2 SENSOR) MISC 2 Devices by Does not apply route every 14 (fourteen) days.  . dapagliflozin propanediol (FARXIGA) 10 MG TABS tablet Take 10 mg by mouth daily.  . Ginkgo Biloba 500 MG CAPS Take 500 mg by mouth at bedtime.  (Patient not taking: Reported on 07/25/2020)  . glucose blood test strip 1 each by Other route as needed for other. Use as instructed  . lisinopril (ZESTRIL) 40 MG tablet Take 1 tablet (40 mg total) by mouth at bedtime.  . metFORMIN (GLUCOPHAGE-XR) 500 MG 24 hr tablet Take 2 tablets (1,000 mg total) by mouth in the morning and at bedtime.  . Multiple Vitamin (MULTIVITAMIN WITH MINERALS) TABS tablet Take 1 tablet by mouth daily.  . RYBELSUS 7 MG TABS TAKE 1 TABLET BY MOUTH DAILY   No facility-administered encounter medications on file as of 08/20/2020.    Allergies (verified) Penicillins    History: Past Medical History:  Diagnosis Date  . CVA (cerebral vascular accident) (HCC) 2015  . Depression   . Diabetes (HCC)    t2  . Gallstones   . Hyperlipemia   . Hypertension   . Migraines    Past Surgical History:  Procedure Laterality Date  . BREAST BIOPSY Right 05/2019  . CESAREAN SECTION     x2  . CHOLECYSTECTOMY N/A 05/25/2016   Procedure: LAPAROSCOPIC CHOLECYSTECTOMY WITH INTRAOPERATIVE CHOLANGIOGRAM;  Surgeon: Luretha Murphy, MD;  Location: [facility]S;  Service: General;  Laterality: N/A;  . IR GENERIC HISTORICAL  03/13/2016   IR PERC CHOLECYSTOSTOMY 03/13/2016 [name], MD [facility]INTER[facility]  . IR GENERIC HISTORICAL  04/28/2016   IR RADIOLOGIST EVAL & MGMT 04/28/2016 [name], MD [facility] INTER[facility] RAD  . LOOP RECORDER IMPLANT N/A 11/29/2013   Procedure: LOOP RECORDER IMPLANT;  Surgeon: Marinus Maw, MD;  Location: [facility] CATH LAB;  Service: Cardiovascular;  Laterality: N/A;  . TEE WITHOUT CARDIOVERSION N/A 08/24/2013   Procedure: TRANSESOPHAGEAL ECHOCARDIOGRAM (TEE);  Surgeon: [name], MD;  Location: [facility] ENDOSCOPY;  Service: Cardiovascular;  Laterality: N/A;   Family History  Problem Relation Age of Onset  . Stomach cancer Mother   . High blood pressure Mother   . Diabetes Mother   . Diabetes Maternal Grandfather   . High blood pressure Maternal Grandfather   . Heart disease Neg  Hx    Social History   Socioeconomic History  . Marital status: Married    Spouse name: Not on file  . Number of children: 3  . Years of education: MBA  . Highest education level: Not on file  Occupational History    Employer: [employer]    Comment: [employer]  Tobacco Use  . Smoking status: Never Smoker  . Smokeless tobacco: Never Used  Vaping Use  . Vaping Use: Never used  Substance and Sexual Activity  . Alcohol use: No    Alcohol/week: 1.0 standard drink    Types: 1 Standard drinks or equivalent per week    Comment: Social  . Drug use: No  . Sexual activity: Not on  file  Other Topics Concern  . Not on file  Social History Narrative   Patient lives at home with [name].   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         Social Determinants of Health   Financial Resource Strain: Not on file  Food Insecurity: Not on file  Transportation Needs: Not on file  Physical Activity: Not on file  Stress: Not on file  Social Connections: Not on file    Tobacco Counseling Counseling given: Not Answered   Clinical Intake:                 Diabetes:  Is the patient diabetic?  Yes  If diabetic, was a CBG obtained today?  No  Did the patient bring in their glucometer from home?  No  How often do you monitor your CBG's? ***.   Financial Strains and Diabetes Management:  Are you having any financial strains with the device, your supplies or your medication? {YES/NO:21197}.  Does the patient want to be seen by Chronic Care Management for management of their diabetes?  {YES/NO:21197} Would the patient like to be referred to a Nutritionist or for Diabetic Management?  {YES/NO:21197}  Diabetic Exams:  Diabetic Eye Exam: Completed 01/08/2020.   Diabetic Foot Exam: Completed 07/04/2020.          Activities of Daily Living No flowsheet data found.  Patient Care Team: Mliss Sax, MD as PCP - General (Family Medicine) Jodelle Red, MD as PCP - Cardiology (Cardiology) Levert Feinstein, MD as Consulting Physician (Neurology)  Indicate any recent Medical Services you may have received from other than [facility] providers in the past year (date may be approximate).     Assessment:   This is a routine wellness examination for [place].  Hearing/Vision screen No exam data present  Dietary issues and exercise activities discussed:    Goals Addressed   None    Depression Screen No flowsheet data found.  Fall Risk Fall Risk  07/04/2020  05/14/2020 06/23/2019  Falls in the past year? 1 1 0  Number falls in past yr: 1 1 -  Injury with Fall? 1 1 -  Comment - broken tooth -    FALL RISK PREVENTION PERTAINING TO THE HOME:  Any stairs in or around the home? {YES/NO:21197} If so, are there any without handrails? {YES/NO:21197} Home free of loose throw rugs in walkways, pet beds, electrical cords, etc? {YES/NO:21197} Adequate lighting in your home to reduce risk of falls? {YES/NO:21197}  ASSISTIVE DEVICES UTILIZED TO PREVENT FALLS:  Life alert? {YES/NO:21197} Use of a cane, walker or w/c? {YES/NO:21197} Grab bars in the bathroom? {  YES/NO:21197} Shower chair or bench in shower? {YES/NO:21197} Elevated toilet seat or a handicapped toilet? {YES/NO:21197}  TIMED UP AND GO:  Was the test performed? {YES/NO:21197}.  Length of time to ambulate 10 feet: *** sec.   {Appearance of JTTS:1779390}  Cognitive Function:        Immunizations Immunization History  Administered Date(s) Administered  . Influenza,inj,Quad PF,6+ Mos 02/05/2018, 11/23/2018  . Influenza-Unspecified 12/22/2019  . PFIZER(Purple Top)SARS-COV-2 Vaccination 06/01/2019, 06/23/2019, 01/19/2020  . Pneumococcal Polysaccharide-23 07/04/2020  . Tdap 12/27/2019  . Zoster Recombinat (Shingrix) 07/20/2017, 01/06/2018    TDAP status: Up to date  Flu Vaccine status: Up to date  Pneumococcal vaccine status: Up to date  Covid-19 vaccine status: Completed vaccines  Qualifies for Shingles Vaccine? No   Zostavax completed No   Shingrix Completed?: Yes  Screening Tests Health Maintenance  Topic Date Due  . COLONOSCOPY (Pts 45-77yrs Insurance coverage will need to be confirmed)  Never done  . DEXA SCAN  Never done  . INFLUENZA VACCINE  11/04/2020  . HEMOGLOBIN A1C  11/11/2020  . OPHTHALMOLOGY EXAM  01/07/2021  . FOOT EXAM  07/04/2021  . PNA vac Low Risk Adult (2 of 2 - PCV13) 07/04/2021  . MAMMOGRAM  02/18/2022  . TETANUS/TDAP  12/26/2029  . COVID-19  Vaccine  Completed  . Hepatitis C Screening  Completed  . HPV VACCINES  Aged Out    Health Maintenance  Health Maintenance Due  Topic Date Due  . COLONOSCOPY (Pts 45-25yrs Insurance coverage will need to be confirmed)  Never done  . DEXA SCAN  Never done    {Colorectal cancer screening:2101809}  Mammogram status: Completed Bilateral 02/19/2020. Repeat every year  {Bone Density status:21018021}  Lung Cancer Screening: (Low Dose CT Chest recommended if Age 22-80 years, 30 pack-year currently smoking OR have quit w/in 15years.) does not qualify.     Additional Screening:  Hepatitis C Screening:Completed 10/27/2019  Vision Screening: Recommended annual ophthalmology exams for early detection of glaucoma and other disorders of the eye. Is the patient up to date with their annual eye exam?  Yes  Who is the provider or what is the name of the office in which the patient attends annual eye exams? ***   Dental Screening: Recommended annual dental exams for proper oral hygiene  Community Resource Referral / Chronic Care Management: CRR required this visit?  {YES/NO:21197}  CCM required this visit?  {YES/NO:21197}     Plan:     I have personally reviewed and noted the following in the patient's chart:   . Medical and social history . Use of alcohol, tobacco or illicit drugs  . Current medications and supplements including opioid prescriptions. {Opioid Prescriptions:[phone]} . Functional ability and status . Nutritional status . Physical activity . Advanced directives . List of other physicians . Hospitalizations, surgeries, and ER visits in previous 12 months . Vitals . Screenings to include cognitive, depression, and falls . Referrals and appointments  In addition, I have reviewed and discussed with patient certain preventive protocols, quality metrics, and best practice recommendations. A written personalized care plan for preventive services as well as general  preventive health recommendations were provided to patient.     Roanna Raider, LPN   3/00/9233  Nurse Health Advisor  Nurse Notes: ***

## 2020-09-17 ENCOUNTER — Other Ambulatory Visit: Payer: Self-pay

## 2020-09-17 ENCOUNTER — Encounter (INDEPENDENT_AMBULATORY_CARE_PROVIDER_SITE_OTHER): Payer: Self-pay | Admitting: Ophthalmology

## 2020-09-17 ENCOUNTER — Ambulatory Visit (INDEPENDENT_AMBULATORY_CARE_PROVIDER_SITE_OTHER): Payer: Medicare Other | Admitting: Ophthalmology

## 2020-09-17 DIAGNOSIS — E113411 Type 2 diabetes mellitus with severe nonproliferative diabetic retinopathy with macular edema, right eye: Secondary | ICD-10-CM | POA: Diagnosis not present

## 2020-09-17 NOTE — Progress Notes (Signed)
09/17/2020     CHIEF COMPLAINT Patient presents for Retina Follow Up (8 Wk F/U OD//Pt sts VA OD has improved. VA OS stable. Pt denies ocular pain, flashes of light, or floaters OU. //LBS: 157 recently, "up and down" per pt)   HISTORY OF PRESENT ILLNESS: Marie [name] is a 65 y.o. female who presents to the clinic today for:   HPI     Retina Follow Up           Diagnosis: Diabetic Retinopathy   Laterality: right eye   Onset: 8 weeks ago   Severity: moderate   Duration: 8 weeks   Course: stable   Comments: 8 Wk F/U OD  Pt sts VA OD has improved. VA OS stable. Pt denies ocular pain, flashes of light, or floaters OU.   LBS: 157 recently, "up and down" per pt       Last edited by Gwendel Hanson, COA on 09/17/2020  1:27 PM.      Referring physician: Mliss Sax, MD [address] [place],  [place] [address]  HISTORICAL INFORMATION:   Selected notes from the MEDICAL RECORD NUMBER    Lab Results  Component Value Date   HGBA1C 7.2 (H) 05/14/2020     CURRENT MEDICATIONS: No current outpatient medications on file. (Ophthalmic Drugs)   No current facility-administered medications for this visit. (Ophthalmic Drugs)   Current Outpatient Medications (Other)  Medication Sig   aspirin EC 81 MG tablet Take 81 mg by mouth at bedtime.   atorvastatin (LIPITOR) 40 MG tablet Take 1 tablet (40 mg total) by mouth daily.   Cholecalciferol (VITAMIN D3) 3000 units TABS Take 3,000 Units by mouth at bedtime.   Continuous Blood Gluc Sensor (FREESTYLE LIBRE 2 SENSOR) MISC 2 Devices by Does not apply route every 14 (fourteen) days.   dapagliflozin propanediol (FARXIGA) 10 MG TABS tablet Take 10 mg by mouth daily.   Ginkgo Biloba 500 MG CAPS Take 500 mg by mouth at bedtime.  (Patient not taking: Reported on 07/25/2020)   glucose blood test strip 1 each by Other route as needed for other. Use as instructed   lisinopril (ZESTRIL) 40 MG tablet Take 1 tablet (40 mg total) by mouth  at bedtime.   metFORMIN (GLUCOPHAGE-XR) 500 MG 24 hr tablet Take 2 tablets (1,000 mg total) by mouth in the morning and at bedtime.   Multiple Vitamin (MULTIVITAMIN WITH MINERALS) TABS tablet Take 1 tablet by mouth daily.   RYBELSUS 7 MG TABS TAKE 1 TABLET BY MOUTH DAILY   No current facility-administered medications for this visit. (Other)      REVIEW OF SYSTEMS:    ALLERGIES Allergies  Allergen Reactions   Penicillins Other (See Comments)    Driving couldn't see and head felt heavy  Has patient had a PCN reaction causing immediate rash, facial/tongue/throat swelling, SOB or lightheadedness with hypotension: No Has patient had a PCN reaction causing severe rash involving mucus membranes or skin necrosis: No Has patient had a PCN reaction that required hospitalization: No Has patient had a PCN reaction occurring within the last 10 years: No If all of the above answers are "NO", then may proceed with Cephalosporin use.     PAST MEDICAL HISTORY Past Medical History:  Diagnosis Date   CVA (cerebral vascular accident) (HCC) 2015   Depression    Diabetes (HCC)    t2   Gallstones    Hyperlipemia    Hypertension    Migraines    Past  Surgical History:  Procedure Laterality Date   BREAST BIOPSY Right 05/2019   CESAREAN SECTION     x2   CHOLECYSTECTOMY N/A 05/25/2016   Procedure: LAPAROSCOPIC CHOLECYSTECTOMY WITH INTRAOPERATIVE CHOLANGIOGRAM;  Surgeon: Luretha MurphyMatthew Martin, MD;  Location: [facility];  Service: General;  Laterality: N/A;   IR GENERIC HISTORICAL  03/13/2016   IR PERC CHOLECYSTOSTOMY 03/13/2016 Simonne ComeJohn Watts, MD [facility]   IR GENERIC HISTORICAL  04/28/2016   IR RADIOLOGIST EVAL & MGMT 04/28/2016 Malachy MoanHeath McCullough, MD [facility]   LOOP RECORDER IMPLANT N/A 11/29/2013   Procedure: LOOP RECORDER IMPLANT;  Surgeon: Marinus MawGregg W Taylor, MD;  [facility];  Service: Cardiovascular;  Laterality: N/A;   TEE WITHOUT CARDIOVERSION N/A 08/24/2013   Procedure: TRANSESOPHAGEAL  ECHOCARDIOGRAM (TEE);  Surgeon: Thurmon FairMihai Croitoru, MD;  [facility];  Service: Cardiovascular;  Laterality: N/A;    FAMILY HISTORY Family History  Problem Relation Age of Onset   Stomach cancer Mother    High blood pressure Mother    Diabetes Mother    Diabetes Maternal Grandfather    High blood pressure Maternal Grandfather    Heart disease Neg Hx     SOCIAL HISTORY Social History   Tobacco Use   Smoking status: Never   Smokeless tobacco: Never  Vaping Use   Vaping Use: Never used  Substance Use Topics   Alcohol use: No    Alcohol/week: 1.0 standard drink    Types: 1 Standard drinks or equivalent per week    Comment: Social   Drug use: No         OPHTHALMIC EXAM:  Base Eye Exam     Visual Acuity (ETDRS)       Right Left   Dist [place] 20/400 20/20   Dist ph [place] NI          Tonometry (Tonopen, 1:31 PM)       Right Left   Pressure 19 18         Pupils       Pupils Dark Light Shape React APD   Right PERRL 7 7 Round Minimal None   Left PERRL 7 7 Round Minimal None         Visual Fields (Counting fingers)       Left Right    Full Full         Extraocular Movement       Right Left    Full Full         Neuro/Psych     Oriented x3: Yes   Mood/Affect: Normal         Dilation     Right eye: 1.0% Mydriacyl, 2.5% Phenylephrine @ 1:31 PM           Slit Lamp and Fundus Exam     External Exam       Right Left   External Normal Normal         Slit Lamp Exam       Right Left   Lids/Lashes Normal Normal   Conjunctiva/Sclera White and quiet White and quiet   Cornea Clear Clear   Anterior Chamber Deep and quiet Deep and quiet   Iris Round and reactive Round and reactive   Lens Posterior chamber intraocular lens, 1+ haze 2+ Nuclear sclerosis, Cortical cataract   Anterior Vitreous Normal Normal         Fundus Exam       Right Left   Posterior Vitreous Posterior vitreous detachment, Central vitreous  floaters     Disc Normal    C/D Ratio 0.5    Macula Focal laser scars,   much less macular thickening, Microaneurysms, much less    Vessels Retinopathy,, severe NPDR    Periphery Normal, good prp, room 360,,, for more laser in the future, posteriorly, and nasally             IMAGING AND PROCEDURES  Imaging and Procedures for 09/17/20  OCT, Retina - OU - Both Eyes       Right Eye Quality was good. Scan locations included subfoveal. Central Foveal Thickness: 638. Progression has worsened. Findings include abnormal foveal contour, cystoid macular edema.   Left Eye Quality was good. Scan locations included subfoveal. Central Foveal Thickness: 314. Progression has been stable. Findings include abnormal foveal contour.   Notes Recurrence of massive CSME and CME OD post resolution of beneficial effect of Ozurdex OD. OS with no CSME detected     Color Fundus Photography Optos - OU - Both Eyes       Right Eye Progression has worsened. Disc findings include normal observations. Macula : edema, microaneurysms.   Left Eye Progression has been stable. Disc findings include normal observations. Macula : normal observations. Vessels : normal observations. Periphery : normal observations.   Notes Good PRP nearly 360, some small region inferonasal available for more laser but clear media at this time with quiet PDR, increased CSME recurrent at the end of Ozurdex beneficial effect  OS with severe NPDR             ASSESSMENT/PLAN:  Severe nonproliferative diabetic retinopathy of right eye, with macular edema, associated with type 2 diabetes mellitus (HCC) CSME, now 4 months postop at last Ozurdex delivered February 2022 and prior to that October 2021.  Each with nice improvement in CSME yet now with recurrence as the effect is worn off.  OD may be a candidate for using long-term intravitreal steroid implant.  Will have to check with her insurance for possibilities of reimbursement and  coverage of that implant.     ICD-10-CM   1. Severe nonproliferative diabetic retinopathy of right eye, with macular edema, associated with type 2 diabetes mellitus (HCC)  E11.3411 OCT, Retina - OU - Both Eyes    Color Fundus Photography Optos - OU - Both Eyes      1.  Chronic recurrent CSME OD on the basis of diabetic retinopathy which is resistant to Lucentis, Avastin and Eylea.  The condition in the right eye is only steroid responsive with no secondary side effect.  Ozurdex has been used twice with no side effects, For the right eye.  He may contemplate use of long-acting steroid implant pending insurance approval availability  2.  Return visit in 3 weeks to evaluate the capability of steroid long-term implant available through her current insurance coverage  3.  Ophthalmic Meds Ordered this visit:  No orders of the defined types were placed in this encounter.      Return in about 3 weeks (around 10/08/2020) for dilate, OD, inject long-term steroid implant OD if approved.  There are no Patient Instructions on file for this visit.   Explained the diagnoses, plan, and follow up with the patient and they expressed understanding.  Patient expressed understanding of the importance of proper follow up care.   Alford Highland Donnalee Cellucci M.D. Diseases & Surgery of the Retina and Vitreous [facility] 09/17/20     Abbreviations: M myopia (nearsighted); A astigmatism; H  hyperopia (farsighted); P presbyopia; Mrx spectacle prescription;  CTL contact lenses; OD right eye; OS left eye; OU both eyes  XT exotropia; ET esotropia; PEK punctate epithelial keratitis; PEE punctate epithelial erosions; DES dry eye syndrome; MGD meibomian gland dysfunction; ATs artificial tears; PFAT's preservative free artificial tears; NSC nuclear sclerotic cataract; PSC posterior subcapsular cataract; ERM epi-retinal membrane; PVD posterior vitreous detachment; RD retinal detachment; DM diabetes mellitus; DR  diabetic retinopathy; NPDR non-proliferative diabetic retinopathy; PDR proliferative diabetic retinopathy; CSME clinically significant macular edema; DME diabetic macular edema; dbh dot blot hemorrhages; CWS cotton wool spot; POAG primary open angle glaucoma; C/D cup-to-disc ratio; HVF humphrey visual field; GVF goldmann visual field; OCT optical coherence tomography; IOP intraocular pressure; BRVO Branch retinal vein occlusion; CRVO central retinal vein occlusion; CRAO central retinal artery occlusion; BRAO branch retinal artery occlusion; RT retinal tear; SB scleral buckle; PPV pars plana vitrectomy; VH Vitreous hemorrhage; PRP panretinal laser photocoagulation; IVK intravitreal kenalog; VMT vitreomacular traction; MH Macular hole;  NVD neovascularization of the disc; NVE neovascularization elsewhere; AREDS age related eye disease study; ARMD age related macular degeneration; POAG primary open angle glaucoma; EBMD epithelial/anterior basement membrane dystrophy; ACIOL anterior chamber intraocular lens; IOL intraocular lens; PCIOL posterior chamber intraocular lens; Phaco/IOL phacoemulsification with intraocular lens placement; PRK photorefractive keratectomy; LASIK laser assisted in situ keratomileusis; HTN hypertension; DM diabetes mellitus; COPD chronic obstructive pulmonary disease

## 2020-09-17 NOTE — Assessment & Plan Note (Signed)
CSME, now 4 months postop at last Ozurdex delivered February 2022 and prior to that October 2021.  Each with nice improvement in CSME yet now with recurrence as the effect is worn off.  OD may be a candidate for using long-term intravitreal steroid implant.  Will have to check with her insurance for possibilities of reimbursement and coverage of that implant.

## 2020-09-18 ENCOUNTER — Encounter (INDEPENDENT_AMBULATORY_CARE_PROVIDER_SITE_OTHER): Payer: Medicare Other | Admitting: Ophthalmology

## 2020-09-18 ENCOUNTER — Telehealth: Payer: Self-pay | Admitting: Family Medicine

## 2020-09-18 NOTE — Telephone Encounter (Signed)
Left message for patient to call back and schedule Medicare Annual Wellness Visit (AWV).   Please offer to do virtually or by telephone.   Due for AWVI  Please schedule at anytime with Nurse Health Advisor.   

## 2020-09-20 ENCOUNTER — Telehealth: Payer: Self-pay | Admitting: Family Medicine

## 2020-09-20 NOTE — Telephone Encounter (Signed)
Pt would like to know if she is able to give her urine sample here for Dr. Remus Blake.  It is very difficult for ger to line up transportation. She can walk up to [facility]. Please advise pt at [phone].

## 2020-09-20 NOTE — Telephone Encounter (Signed)
Yes that should be fine but it looks like she has an appointment with Dr. Lucianne Muss next week.

## 2020-09-23 ENCOUNTER — Other Ambulatory Visit: Payer: Medicare Other

## 2020-09-24 ENCOUNTER — Other Ambulatory Visit: Payer: Medicare Other

## 2020-09-24 ENCOUNTER — Other Ambulatory Visit (INDEPENDENT_AMBULATORY_CARE_PROVIDER_SITE_OTHER): Payer: Medicare Other

## 2020-09-24 ENCOUNTER — Other Ambulatory Visit: Payer: Self-pay

## 2020-09-24 DIAGNOSIS — I10 Essential (primary) hypertension: Secondary | ICD-10-CM | POA: Diagnosis not present

## 2020-09-24 DIAGNOSIS — E7800 Pure hypercholesterolemia, unspecified: Secondary | ICD-10-CM

## 2020-09-24 LAB — COMPREHENSIVE METABOLIC PANEL
ALT: 24 U/L (ref 0–35)
AST: 17 U/L (ref 0–37)
Albumin: 4.4 g/dL (ref 3.5–5.2)
Alkaline Phosphatase: 70 U/L (ref 39–117)
BUN: 13 mg/dL (ref 6–23)
CO2: 28 mEq/L (ref 19–32)
Calcium: 9.4 mg/dL (ref 8.4–10.5)
Chloride: 101 mEq/L (ref 96–112)
Creatinine, Ser: 0.68 mg/dL (ref 0.40–1.20)
GFR: 90.81 mL/min (ref >60.00)
Glucose, Bld: 127 mg/dL — ABNORMAL HIGH (ref 70–99)
Potassium: 4.2 mEq/L (ref 3.5–5.1)
Sodium: 138 mEq/L (ref 135–145)
Total Bilirubin: 0.5 mg/dL (ref 0.2–1.2)
Total Protein: 7.1 g/dL (ref 6.0–8.3)

## 2020-09-24 LAB — CBC
HCT: 41.4 % (ref 36.0–46.0)
Hemoglobin: 13.8 g/dL (ref 12.0–15.0)
MCHC: 33.4 g/dL (ref 30.0–36.0)
MCV: 85.6 fl (ref 78.0–100.0)
Platelets: 211 10*3/uL (ref 150.0–400.0)
RBC: 4.83 Mil/uL (ref 3.87–5.11)
RDW: 13.7 % (ref 11.5–15.5)
WBC: 5.7 10*3/uL (ref 4.0–10.5)

## 2020-09-24 LAB — LDL CHOLESTEROL, DIRECT: Direct LDL: 136 mg/dL

## 2020-09-24 NOTE — Progress Notes (Signed)
Per orders of Dr. Kremer pt is here for labs pt tolerated draw well.  

## 2020-09-25 MED ORDER — ATORVASTATIN CALCIUM 80 MG PO TABS: 80.0000 mg | ORAL_TABLET | Freq: Every day | ORAL | 3 refills | Status: DC

## 2020-09-25 NOTE — Addendum Note (Signed)
Addended by: Nadene Rubins A on: 09/25/2020 08:00 AM   Modules accepted: Orders

## 2020-09-26 ENCOUNTER — Ambulatory Visit (INDEPENDENT_AMBULATORY_CARE_PROVIDER_SITE_OTHER): Payer: Medicare Other | Admitting: Endocrinology

## 2020-09-26 ENCOUNTER — Encounter: Payer: Self-pay | Admitting: Endocrinology

## 2020-09-26 ENCOUNTER — Other Ambulatory Visit: Payer: Self-pay

## 2020-09-26 VITALS — BP 146/82 | HR 82 | Ht 62.5 in | Wt 127.4 lb

## 2020-09-26 DIAGNOSIS — E782 Mixed hyperlipidemia: Secondary | ICD-10-CM

## 2020-09-26 DIAGNOSIS — E1165 Type 2 diabetes mellitus with hyperglycemia: Secondary | ICD-10-CM

## 2020-09-26 DIAGNOSIS — I10 Essential (primary) hypertension: Secondary | ICD-10-CM

## 2020-09-26 LAB — POCT GLYCOSYLATED HEMOGLOBIN (HGB A1C): Hemoglobin A1C: 7.6 % — AB (ref 4.0–5.6)

## 2020-09-26 MED ORDER — REPAGLINIDE 1 MG PO TABS: ORAL_TABLET | ORAL | 2 refills | Status: DC

## 2020-09-26 NOTE — Progress Notes (Signed)
Patient ID: Marie [name], female   DOB: 28-Nov-1955, 65 y.o.   MRN: [id number]           Reason for Appointment: for Type 2 Diabetes   History of Present Illness:          Date of diagnosis of type 2 diabetes mellitus: 2010       Background history:   She was likely treated with Metformin alone for the first few years and subsequently given Januvia In 2018 she was also given [place] in addition Current records indicate her highest A1c was 9.6 in 03/2017.  Has been progressively improving since then She was also started on Rybelsus in 02/2019  Recent history:   A1c is 7.6, previously was 7.2   Non-insulin hypoglycemic drugs the patient is taking are: Metformin 1 g 3 daily, Farxiga 10 mg daily,   Discontinued Rybelsus 7 mg daily  Current management, blood sugar patterns and problems identified: She did not let us know that she was going to stop Rybelsus about a month or so ago when it was too expensive for her  Although her A1c is higher her blood sugars are similar to her last visit She was last seen 2 months ago She was told to increase her metformin to 4 tablets a day but she misunderstood and is taking 2 tablets with every meal without side effects Taking [place] as before Although previously she was asked to take protein with every meal especially breakfast she is eating an egg only every other day otherwise only eating bread in the morning Mostly eating fruit at lunch without a meal as such and usually does not have high readings after lunch She does appear to have better fasting readings overall with increasing metformin Her weight is about the same as last month She does try to do exercise on her home exercise equipment, usually 15 minutes almost daily She thinks she is eating some protein with lunch and dinner, usually chicken or meat She does have occasional significantly high readings after dinner but data is incomplete especially in the last week         Side effects  from medications have been: Decreased appetite from Rybelsus   Typical meal intake: Breakfast is egg, toast .              Exercise:  Exercise equipment, ? elliptical, using for 15 min daily   Glucose monitoring:  Using freestyle libre   Data for the last 2 weeks  CGM use % of time 61  2-week average/GV 151  Time in range       78%  % Time Above 180 22  % Time above 250   % Time Below 70 0     PRE-MEAL Fasting Lunch Dinner Bedtime Overall  Glucose range:       Averages: 124  150  151   POST-MEAL PC Breakfast PC Lunch PC Dinner  Glucose range:     Averages: 198 153 162   Prior data:   CGM use % of time  67  2-week average/GV  158  Time in range    76    %  % Time Above 180  21+3  % Time above 250   % Time Below 70 0     PRE-MEAL Fasting Lunch Dinner Bedtime Overall  Glucose range:       Averages:  134  150  149   158   POST-MEAL PC Breakfast PC Lunch PC Dinner  Glucose range:     Averages:  195  149  159     Dietician visit, most recent: 11/14  Weight history:   Wt Readings from Last 3 Encounters:  09/26/20 127 lb 6.4 oz (57.8 kg)  07/25/20 127 lb 6.4 oz (57.8 kg)  07/04/20 130 lb (59 kg)    Glycemic control:   Lab Results  Component Value Date   HGBA1C 7.6 (A) 09/26/2020   HGBA1C 7.2 (H) 05/14/2020   HGBA1C 6.3 (A) 02/08/2020   Lab Results  Component Value Date   MICROALBUR 1.0 10/27/2019   LDLCALC 76 10/27/2019   CREATININE 0.68 09/24/2020   Lab Results  Component Value Date   MICRALBCREAT 1.2 10/27/2019    No results found for: FRUCTOSAMINE  Office Visit on 09/26/2020  Component Date Value Ref Range Status   Hemoglobin A1C 09/26/2020 7.6 (A) 4.0 - 5.6 % Final  Lab on 09/24/2020  Component Date Value Ref Range Status   Direct LDL 09/24/2020 136.0  mg/dL Final   Optimal:  <034 mg/dLNear or Above Optimal:  100-129 mg/dLBorderline High:  130-159 mg/dLHigh:  160-189 mg/dLVery High:  >190 mg/dL   Sodium 91/79/1505 697  135 - 145 mEq/L  Final   Potassium 09/24/2020 4.2  3.5 - 5.1 mEq/L Final   Chloride 09/24/2020 101  96 - 112 mEq/L Final   CO2 09/24/2020 28  19 - 32 mEq/L Final   Glucose, Bld 09/24/2020 127 (A) 70 - 99 mg/dL Final   BUN 94/80/1655 13  6 - 23 mg/dL Final   Creatinine, Ser 09/24/2020 0.68  0.40 - 1.20 mg/dL Final   Total Bilirubin 09/24/2020 0.5  0.2 - 1.2 mg/dL Final   Alkaline Phosphatase 09/24/2020 70  39 - 117 U/L Final   AST 09/24/2020 17  0 - 37 U/L Final   ALT 09/24/2020 24  0 - 35 U/L Final   Total Protein 09/24/2020 7.1  6.0 - 8.3 g/dL Final   Albumin 37/48/2707 4.4  3.5 - 5.2 g/dL Final   GFR 86/75/4492 90.81  >60.00 mL/min Final   Calculated using the CKD-EPI Creatinine Equation (2021)   Calcium 09/24/2020 9.4  8.4 - 10.5 mg/dL Final   WBC 01/00/7121 5.7  4.0 - 10.5 K/uL Final   RBC 09/24/2020 4.83  3.87 - 5.11 Mil/uL Final   Platelets 09/24/2020 211.0  150.0 - 400.0 K/uL Final   Hemoglobin 09/24/2020 13.8  12.0 - 15.0 g/dL Final   HCT 97/58/8325 41.4  36.0 - 46.0 % Final   MCV 09/24/2020 85.6  78.0 - 100.0 fl Final   MCHC 09/24/2020 33.4  30.0 - 36.0 g/dL Final   RDW 49/82/6415 13.7  11.5 - 15.5 % Final    Allergies as of 09/26/2020       Reactions   Penicillins Other (See Comments)   Driving couldn't see and head felt heavy  Has patient had a PCN reaction causing immediate rash, facial/tongue/throat swelling, SOB or lightheadedness with hypotension: No Has patient had a PCN reaction causing severe rash involving mucus membranes or skin necrosis: No Has patient had a PCN reaction that required hospitalization: No Has patient had a PCN reaction occurring within the last 10 years: No If all of the above answers are "NO", then may proceed with Cephalosporin use.        Medication List        Accurate as of September 26, 2020  2:37 PM. If you have any questions, ask your nurse or doctor.  STOP taking these medications    Rybelsus 7 MG Tabs Generic drug:  Semaglutide Stopped by: Reather Littler, MD       TAKE these medications    aspirin EC 81 MG tablet Take 81 mg by mouth at bedtime.   atorvastatin 80 MG tablet Commonly known as: LIPITOR Take 1 tablet (80 mg total) by mouth daily.   Farxiga 10 MG Tabs tablet Generic drug: dapagliflozin propanediol Take 10 mg by mouth daily.   FreeStyle Libre 2 Sensor Misc 2 Devices by Does not apply route every 14 (fourteen) days.   Ginkgo Biloba 500 MG Caps Take 500 mg by mouth at bedtime.   glucose blood test strip 1 each by Other route as needed for other. Use as instructed   lisinopril 40 MG tablet Commonly known as: ZESTRIL Take 1 tablet (40 mg total) by mouth at bedtime.   metFORMIN 500 MG 24 hr tablet Commonly known as: GLUCOPHAGE-XR Take 2 tablets (1,000 mg total) by mouth in the morning and at bedtime.   multivitamin with minerals Tabs tablet Take 1 tablet by mouth daily.   repaglinide 1 MG tablet Commonly known as: Prandin 2 pills in am and 1 in pm before meals Started by: Reather Littler, MD   Vitamin D3 75 MCG (3000 UT) Tabs Take 3,000 Units by mouth at bedtime.        Allergies:  Allergies  Allergen Reactions   Penicillins Other (See Comments)    Driving couldn't see and head felt heavy  Has patient had a PCN reaction causing immediate rash, facial/tongue/throat swelling, SOB or lightheadedness with hypotension: No Has patient had a PCN reaction causing severe rash involving mucus membranes or skin necrosis: No Has patient had a PCN reaction that required hospitalization: No Has patient had a PCN reaction occurring within the last 10 years: No If all of the above answers are "NO", then may proceed with Cephalosporin use.     Past Medical History:  Diagnosis Date   CVA (cerebral vascular accident) (HCC) 2015   Depression    Diabetes (HCC)    t2   Gallstones    Hyperlipemia    Hypertension    Migraines     Past Surgical History:  Procedure Laterality Date    BREAST BIOPSY Right 05/2019   CESAREAN SECTION     x2   CHOLECYSTECTOMY N/A 05/25/2016   Procedure: LAPAROSCOPIC CHOLECYSTECTOMY WITH INTRAOPERATIVE CHOLANGIOGRAM;  Surgeon: Luretha Murphy, MD;  Location: [facility] ORS;  Service: General;  Laterality: N/A;   IR GENERIC HISTORICAL  03/13/2016   IR PERC CHOLECYSTOSTOMY 03/13/2016 [name], MD [facility] RAD   IR GENERIC HISTORICAL  04/28/2016   IR RADIOLOGIST EVAL & MGMT 04/28/2016 [name], MD [facility]   LOOP RECORDER IMPLANT N/A 11/29/2013   Procedure: LOOP RECORDER IMPLANT;  Surgeon: Marinus Maw, MD;  Location: [facility] CATH LAB;  Service: Cardiovascular;  Laterality: N/A;   TEE WITHOUT CARDIOVERSION N/A 08/24/2013   Procedure: TRANSESOPHAGEAL ECHOCARDIOGRAM (TEE);  Surgeon: [name], MD;  Location: [facility] ENDOSCOPY;  Service: Cardiovascular;  Laterality: N/A;    Family History  Problem Relation Age of Onset   Stomach cancer Mother    High blood pressure Mother    Diabetes Mother    Diabetes Maternal Grandfather    High blood pressure Maternal Grandfather    Heart disease Neg Hx     Social History:  reports that she has never smoked. She has never used smokeless tobacco. She reports that she does  not drink alcohol and does not use drugs.   Review of Systems    Lipid history: LDL previously controlled with atorvastatin 40 mg. She says that she is taking her atorvastatin regularly but her prescription bottle is dated last year in July She was also supposed to change to 80 mg atorvastatin and she is not aware of this, prescription apparently was sent by PCP yesterday    Lab Results  Component Value Date   CHOL 150 10/27/2019   HDL 45.60 10/27/2019   LDLCALC 76 10/27/2019   LDLDIRECT 136.0 09/24/2020   TRIG 141.0 10/27/2019   CHOLHDL 3 10/27/2019           Hypertension: Has been present and treated with lisinopril 40 mg and amlodipine 5 mg Followed by PCP  BP Readings from Last 3 Encounters:  09/26/20 (!) 146/82   07/25/20 122/78  07/04/20 138/82    Most recent eye exam was in 11/21.  Has retinopathy  Most recent foot exam: 11/21  Currently known complications of diabetes: Retinopathy with decreased vision on the right, regularly followed by retinal surgery  Normal microalbumin in 7/21  History of CVA with left facial numbness and speech difficulty  LABS:  Office Visit on 09/26/2020  Component Date Value Ref Range Status   Hemoglobin A1C 09/26/2020 7.6 (A) 4.0 - 5.6 % Final  Lab on 09/24/2020  Component Date Value Ref Range Status   Direct LDL 09/24/2020 136.0  mg/dL Final   Optimal:  <193 mg/dLNear or Above Optimal:  100-129 mg/dLBorderline High:  130-159 mg/dLHigh:  160-189 mg/dLVery High:  >190 mg/dL   Sodium 79/05/4095 353  135 - 145 mEq/L Final   Potassium 09/24/2020 4.2  3.5 - 5.1 mEq/L Final   Chloride 09/24/2020 101  96 - 112 mEq/L Final   CO2 09/24/2020 28  19 - 32 mEq/L Final   Glucose, Bld 09/24/2020 127 (A) 70 - 99 mg/dL Final   BUN 29/92/4268 13  6 - 23 mg/dL Final   Creatinine, Ser 09/24/2020 0.68  0.40 - 1.20 mg/dL Final   Total Bilirubin 09/24/2020 0.5  0.2 - 1.2 mg/dL Final   Alkaline Phosphatase 09/24/2020 70  39 - 117 U/L Final   AST 09/24/2020 17  0 - 37 U/L Final   ALT 09/24/2020 24  0 - 35 U/L Final   Total Protein 09/24/2020 7.1  6.0 - 8.3 g/dL Final   Albumin 34/19/6222 4.4  3.5 - 5.2 g/dL Final   GFR 97/98/9211 90.81  >60.00 mL/min Final   Calculated using the CKD-EPI Creatinine Equation (2021)   Calcium 09/24/2020 9.4  8.4 - 10.5 mg/dL Final   WBC 94/17/4081 5.7  4.0 - 10.5 K/uL Final   RBC 09/24/2020 4.83  3.87 - 5.11 Mil/uL Final   Platelets 09/24/2020 211.0  150.0 - 400.0 K/uL Final   Hemoglobin 09/24/2020 13.8  12.0 - 15.0 g/dL Final   HCT 44/81/8563 41.4  36.0 - 46.0 % Final   MCV 09/24/2020 85.6  78.0 - 100.0 fl Final   MCHC 09/24/2020 33.4  30.0 - 36.0 g/dL Final   RDW 14/97/0263 13.7  11.5 - 15.5 % Final    Physical Examination:  BP (!)  146/82   Pulse 82   Ht 5' 2.5" (1.588 m)   Wt 127 lb 6.4 oz (57.8 kg)   SpO2 99%   BMI 22.93 kg/m      Exam not indicated     ASSESSMENT:  Diabetes type 2 nonobese  See history of  present illness for detailed discussion of current diabetes management, blood sugar patterns and problems identified  A1c is 7.6, last 7.2  Current treatment regimen is Metformin, Farxiga 10 mg  Although her A1c is higher and her blood sugars do not appear to be significantly different with not taking her Rybelsus in the last month She cannot take this because of cost, likely in the donut hole  Appears to have significantly high readings after breakfast especially when she is not getting her protein and periodically after dinner Diet is fairly good except for needing more protein at breakfast daily Weight is about the same She is usually trying to exercise Also by mistake she is taking 3000 mg of metformin a day instead of 2000 without side effects  LIPIDS: Likely has not taken her atorvastatin regularly and her LDL is 136, previously much better  PLAN:    She will try PRANDIN 2 mg before breakfast and 1 mg before dinner Explained how this works and potential for adjustment of dosage and possible hypoglycemia if she is not getting enough carbohydrate She will get a protein with breakfast daily and given her sources of protein still use She is to check her blood sugars consistently after dinner also Go back to metformin ER to 2 tablets twice daily She will call if she has consistently high or low readings with starting Prandin Continue Farxiga  Continue regular exercise  New prescription for atorvastatin has been sent yesterday by PCP and she will need to make sure she takes this regularly  Follow-up in 2 months    Patient Instructions  Repeglanide 2 before Bfst and 1   Metformin 2 with Bfsft and dinner  Check at nite daily  Check blood sugars on waking up 7 days a week  Also check  blood sugars about 2 hours after meals and do this after different meals by rotation  Recommended blood sugar levels on waking up are 90-130 and about 2 hours after meal is 130-160  Please bring your blood sugar monitor to each visit, thank you        Reather Littler 09/26/2020, 2:37 PM   Note: This office note was prepared with Dragon voice recognition system technology. Any transcriptional errors that result from this process are unintentional.

## 2020-09-26 NOTE — Patient Instructions (Addendum)
Repeglanide 2 before Bfst and 1   Metformin 2 with Bfsft and dinner  Check at nite daily  Check blood sugars on waking up 7 days a week  Also check blood sugars about 2 hours after meals and do this after different meals by rotation  Recommended blood sugar levels on waking up are 90-130 and about 2 hours after meal is 130-160  Please bring your blood sugar monitor to each visit, thank you

## 2020-10-14 ENCOUNTER — Encounter (INDEPENDENT_AMBULATORY_CARE_PROVIDER_SITE_OTHER): Payer: Medicare Other | Admitting: Ophthalmology

## 2020-10-16 ENCOUNTER — Telehealth: Payer: Self-pay | Admitting: Endocrinology

## 2020-10-16 NOTE — Telephone Encounter (Signed)
We have not received a fax from [facility]. Once received provider will sign and we will fax back

## 2020-10-16 NOTE — Telephone Encounter (Signed)
Pt called after speaking with [facility], they have sent Marie [name] over a fax to refill her Freestyle sensors and she was just calling to check the status of it. Pt would just like Dr. Lucianne Muss to know she is now completely out of her sensors.

## 2020-10-18 DIAGNOSIS — Z794 Long term (current) use of insulin: Secondary | ICD-10-CM | POA: Diagnosis not present

## 2020-10-18 DIAGNOSIS — E119 Type 2 diabetes mellitus without complications: Secondary | ICD-10-CM | POA: Diagnosis not present

## 2020-10-18 NOTE — Telephone Encounter (Signed)
Paperwork has been completed and faxed along with chart notes.

## 2020-10-18 NOTE — Telephone Encounter (Signed)
Spoke to pt regarding status of [facility] paperwork. I informed her it is just awaiting Dr's signature and then it will be sent back out to [facility].

## 2020-10-21 ENCOUNTER — Other Ambulatory Visit: Payer: Self-pay | Admitting: Family Medicine

## 2020-10-21 ENCOUNTER — Telehealth: Payer: Self-pay | Admitting: Family Medicine

## 2020-10-21 NOTE — Telephone Encounter (Signed)
Forms received and will be viewed/signed then given to patient at [facility] this week.

## 2020-10-21 NOTE — Telephone Encounter (Signed)
Pt dropped off form that had been filled out previously but was missing some info and it was returned to her, She said what was missing was highlighted. I put on Marie [name]'s desk

## 2020-10-24 ENCOUNTER — Other Ambulatory Visit: Payer: Self-pay

## 2020-10-24 ENCOUNTER — Ambulatory Visit (INDEPENDENT_AMBULATORY_CARE_PROVIDER_SITE_OTHER): Payer: Medicare Other | Admitting: Family Medicine

## 2020-10-24 ENCOUNTER — Encounter: Payer: Self-pay | Admitting: Family Medicine

## 2020-10-24 VITALS — BP 140/72 | HR 82 | Temp 97.7°F | Ht 62.0 in | Wt 131.2 lb

## 2020-10-24 DIAGNOSIS — Z0000 Encounter for general adult medical examination without abnormal findings: Secondary | ICD-10-CM | POA: Insufficient documentation

## 2020-10-24 DIAGNOSIS — E7800 Pure hypercholesterolemia, unspecified: Secondary | ICD-10-CM | POA: Insufficient documentation

## 2020-10-24 DIAGNOSIS — Z01419 Encounter for gynecological examination (general) (routine) without abnormal findings: Secondary | ICD-10-CM

## 2020-10-24 DIAGNOSIS — Z7189 Other specified counseling: Secondary | ICD-10-CM

## 2020-10-24 NOTE — Progress Notes (Addendum)
Established Patient Office Visit  Subjective:  Patient ID: Marie [name], female    DOB: 09/12/1955  Age: 65 y.o. MRN: [id number]  CC:  Chief Complaint  Patient presents with   Annual Exam    CPE, no concerns. Patient fasting.     HPI Marie [name] presents for follow-up of elevated LDL cholesterol, request for DNR form, bladder control issues, and medication dosage confusion.  Patient says that her endocrinologist asked her to take 3 500 mg metformin twice daily.  Chart review shows that he had asked her to take 2 500 mg tablets twice daily.  We discussed this.  Patient would like for me to sign a DNR form for her.  She has been having some issues with bladder control.  No recent Pap smear.  She is having no issues taking the higher dose of Lipitor.  Dr. Lucianne MussKumar has a lipid panel and fructosamine ordered for her.  She is nonfasting.  Past Medical History:  Diagnosis Date   CVA (cerebral vascular accident) (HCC) 2015   Depression    Diabetes (HCC)    t2   Gallstones    Hyperlipemia    Hypertension    Migraines     Past Surgical History:  Procedure Laterality Date   BREAST BIOPSY Right 05/2019   CESAREAN SECTION     x2   CHOLECYSTECTOMY N/A 05/25/2016   Procedure: LAPAROSCOPIC CHOLECYSTECTOMY WITH INTRAOPERATIVE CHOLANGIOGRAM;  Surgeon: Luretha MurphyMatthew Martin, MD;  Location: [facility];  Service: General;  Laterality: N/A;   IR GENERIC HISTORICAL  03/13/2016   IR PERC CHOLECYSTOSTOMY 03/13/2016 Simonne ComeJohn Watts, MD [facility]   IR GENERIC HISTORICAL  04/28/2016   IR RADIOLOGIST EVAL & MGMT 04/28/2016 Malachy MoanHeath McCullough, MD [facility]   LOOP RECORDER IMPLANT N/A 11/29/2013   Procedure: LOOP RECORDER IMPLANT;  Surgeon: Marinus MawGregg W Taylor, MD;  [facility];  Service: Cardiovascular;  Laterality: N/A;   TEE WITHOUT CARDIOVERSION N/A 08/24/2013   Procedure: TRANSESOPHAGEAL ECHOCARDIOGRAM (TEE);  Surgeon: Thurmon FairMihai Croitoru, MD;  [facility];  Service: Cardiovascular;  Laterality: N/A;     Family History  Problem Relation Age of Onset   Stomach cancer Mother    High blood pressure Mother    Diabetes Mother    Diabetes Maternal Grandfather    High blood pressure Maternal Grandfather    Heart disease Neg Hx     Social History   Socioeconomic History   Marital status: Married    Spouse name: Not on file   Number of children: 3   Years of education: MBA   Highest education level: Not on file  Occupational History    Employer: [employer]    Comment: [employer]  Tobacco Use   Smoking status: Never   Smokeless tobacco: Never  Vaping Use   Vaping Use: Never used  Substance and Sexual Activity   Alcohol use: No    Alcohol/week: 1.0 standard drink    Types: 1 Standard drinks or equivalent per week    Comment: Social   Drug use: No   Sexual activity: Not on file  Other Topics Concern   Not on file  Social History Narrative   Patient lives at home with [name].   Patient works full time [employer]     College education MBA   Right handed   Caffeine two cups daily.         Social Determinants of Health   Financial Resource Strain: Not on file  Food Insecurity: Not on file  Transportation Needs: Not on file  Physical Activity: Not on file  Stress: Not on file  Social Connections: Not on file  Intimate Partner Violence: Not on file    Outpatient Medications Prior to Visit  Medication Sig Dispense Refill   aspirin EC 81 MG tablet Take 81 mg by mouth at bedtime.     Cholecalciferol (VITAMIN D3) 3000 units TABS Take 3,000 Units by mouth at bedtime.     Continuous Blood Gluc Sensor (FREESTYLE LIBRE 2 SENSOR) MISC 2 Devices by Does not apply route every 14 (fourteen) days. 6 each 3   Ginkgo Biloba 500 MG CAPS Take 500 mg by mouth at bedtime.     glucose blood test strip 1 each by Other route as needed for other. Use as instructed     metFORMIN (GLUCOPHAGE-XR) 500 MG 24 hr tablet Take 2 tablets (1,000 mg total) by mouth in the  morning and at bedtime. 360 tablet 3   Multiple Vitamin (MULTIVITAMIN WITH MINERALS) TABS tablet Take 1 tablet by mouth daily.     atorvastatin (LIPITOR) 80 MG tablet Take 1 tablet (80 mg total) by mouth daily. 90 tablet 3   FARXIGA 10 MG TABS tablet TAKE 1 TABLET BY MOUTH DAILY 90 tablet 3   lisinopril (ZESTRIL) 40 MG tablet Take 1 tablet (40 mg total) by mouth at bedtime. (Patient not taking: Reported on 11/04/2020) 90 tablet 3   repaglinide (PRANDIN) 1 MG tablet 2 pills in am and 1 in pm before meals 90 tablet 2   No facility-administered medications prior to visit.    Allergies  Allergen Reactions   Penicillins Other (See Comments)    Driving couldn't see and head felt heavy  Has patient had a PCN reaction causing immediate rash, facial/tongue/throat swelling, SOB or lightheadedness with hypotension: No Has patient had a PCN reaction causing severe rash involving mucus membranes or skin necrosis: No Has patient had a PCN reaction that required hospitalization: No Has patient had a PCN reaction occurring within the last 10 years: No If all of the above answers are "NO", then may proceed with Cephalosporin use.     ROS Review of Systems  Constitutional: Negative.   HENT: Negative.    Eyes:  Positive for visual disturbance. Negative for photophobia.  Respiratory: Negative.    Cardiovascular: Negative.   Gastrointestinal: Negative.   Genitourinary:  Negative for difficulty urinating, frequency and urgency.  Neurological:  Negative for speech difficulty and weakness.  Psychiatric/Behavioral:  Positive for confusion.      Objective:    Physical Exam Vitals and nursing note reviewed.  Constitutional:      General: She is not in acute distress.    Appearance: Normal appearance. She is not ill-appearing, toxic-appearing or diaphoretic.  HENT:     Head: Normocephalic and atraumatic.     Right Ear: Tympanic membrane, ear canal and external ear normal.     Left Ear: Tympanic  membrane, ear canal and external ear normal.  Eyes:     General: No scleral icterus.       Right eye: No discharge.        Left eye: No discharge.     Extraocular Movements: Extraocular movements intact.     Conjunctiva/sclera: Conjunctivae normal.     Pupils: Pupils are equal, round, and reactive to light.  Neck:     Vascular: No carotid bruit.  Cardiovascular:     Rate and Rhythm: Normal rate and regular rhythm.  Pulmonary:     Effort: Pulmonary effort is normal.     Breath sounds: Normal breath sounds.  Musculoskeletal:     Cervical back: No rigidity or tenderness.  Lymphadenopathy:     Cervical: No cervical adenopathy.  Skin:    General: Skin is warm and dry.  Neurological:     Mental Status: She is alert and oriented to person, place, and time.  Psychiatric:        Mood and Affect: Mood normal.        Behavior: Behavior normal.    BP 140/72   Pulse 82   Temp 97.7 F (36.5 C) (Temporal)   Ht 5\' 2"  (1.575 m)   Wt 131 lb 3.2 oz (59.5 kg)   SpO2 96%   BMI 24.00 kg/m  Wt Readings from Last 3 Encounters:  11/04/20 131 lb 12.8 oz (59.8 kg)  10/24/20 131 lb 3.2 oz (59.5 kg)  09/26/20 127 lb 6.4 oz (57.8 kg)     Health Maintenance Due  Topic Date Due   DEXA SCAN  Never done   INFLUENZA VACCINE  11/04/2020    There are no preventive care reminders to display for this patient.  Lab Results  Component Value Date   TSH 2.42 10/13/2018   Lab Results  Component Value Date   WBC 5.7 09/24/2020   HGB 13.8 09/24/2020   HCT 41.4 09/24/2020   MCV 85.6 09/24/2020   PLT 211.0 09/24/2020   Lab Results  Component Value Date   NA 142 10/25/2020   K 4.6 10/25/2020   CO2 28 10/25/2020   GLUCOSE 88 10/25/2020   BUN 15 10/25/2020   CREATININE 0.73 10/25/2020   BILITOT 0.5 09/24/2020   ALKPHOS 70 09/24/2020   AST 17 09/24/2020   ALT 24 09/24/2020   PROT 7.1 09/24/2020   ALBUMIN 4.4 09/24/2020   CALCIUM 9.1 10/25/2020   ANIONGAP 11 05/21/2016   GFR 85.70  10/25/2020   Lab Results  Component Value Date   CHOL 136 10/25/2020   Lab Results  Component Value Date   HDL 49.20 10/25/2020   Lab Results  Component Value Date   LDLCALC 68 10/25/2020   Lab Results  Component Value Date   TRIG 94.0 10/25/2020   Lab Results  Component Value Date   CHOLHDL 3 10/25/2020   Lab Results  Component Value Date   HGBA1C 7.4 (H) 10/25/2020      Assessment & Plan:   Problem List Items Addressed This Visit       Other   Healthcare maintenance   Elevated LDL cholesterol level - Primary   Other Visit Diagnoses     Well female exam with routine gynecological exam       Relevant Orders   Ambulatory referral to Gynecology       No orders of the defined types were placed in this encounter.   Follow-up: Return in about 3 months (around 01/24/2021), or Return fastinf for blood work Dr.Kumar ordered., for METFORMIN DOSE IS 2 PILLS TWICE DAILY.01/26/2021  We had a 10-minute discussion about the difference between DNR and living well.  Patient was confused.  She does not have a will.  Advised to follow-up with her family attorney a living will.  Information was given to her on advanced directives.  Advised her not to sign a DNR form.  Advised patient to see her GYN provider for Pap and pelvic exam and discuss her incontinence issues with them.  Discussed routine health maintenance.  Follow-up in 3 months.  Mliss Sax, MD

## 2020-10-25 ENCOUNTER — Other Ambulatory Visit (INDEPENDENT_AMBULATORY_CARE_PROVIDER_SITE_OTHER): Payer: Medicare Other

## 2020-10-25 DIAGNOSIS — E1165 Type 2 diabetes mellitus with hyperglycemia: Secondary | ICD-10-CM | POA: Diagnosis not present

## 2020-10-25 DIAGNOSIS — E782 Mixed hyperlipidemia: Secondary | ICD-10-CM | POA: Diagnosis not present

## 2020-10-25 DIAGNOSIS — E11319 Type 2 diabetes mellitus with unspecified diabetic retinopathy without macular edema: Secondary | ICD-10-CM | POA: Diagnosis not present

## 2020-10-25 LAB — BASIC METABOLIC PANEL
BUN: 15 mg/dL (ref 6–23)
CO2: 28 mEq/L (ref 19–32)
Calcium: 9.1 mg/dL (ref 8.4–10.5)
Chloride: 105 mEq/L (ref 96–112)
Creatinine, Ser: 0.73 mg/dL (ref 0.40–1.20)
GFR: 85.7 mL/min (ref >60.00)
Glucose, Bld: 88 mg/dL (ref 70–99)
Potassium: 4.6 mEq/L (ref 3.5–5.1)
Sodium: 142 mEq/L (ref 135–145)

## 2020-10-25 LAB — LIPID PANEL
Cholesterol: 136 mg/dL (ref 0–200)
HDL: 49.2 mg/dL (ref >39.00)
LDL Cholesterol: 68 mg/dL (ref 0–99)
NonHDL: 87.13
Total CHOL/HDL Ratio: 3
Triglycerides: 94 mg/dL (ref 0.0–149.0)
VLDL: 18.8 mg/dL (ref 0.0–40.0)

## 2020-10-25 LAB — HEMOGLOBIN A1C: Hgb A1c MFr Bld: 7.4 % — ABNORMAL HIGH (ref 4.6–6.5)

## 2020-10-26 LAB — FRUCTOSAMINE: Fructosamine: 260 umol/L (ref 0–285)

## 2020-10-28 ENCOUNTER — Encounter (INDEPENDENT_AMBULATORY_CARE_PROVIDER_SITE_OTHER): Payer: Medicare Other | Admitting: Ophthalmology

## 2020-10-29 ENCOUNTER — Telehealth: Payer: Self-pay | Admitting: Endocrinology

## 2020-10-29 ENCOUNTER — Other Ambulatory Visit: Payer: Self-pay

## 2020-10-29 DIAGNOSIS — E1165 Type 2 diabetes mellitus with hyperglycemia: Secondary | ICD-10-CM

## 2020-10-29 MED ORDER — DAPAGLIFLOZIN PROPANEDIOL 10 MG PO TABS: 10.0000 mg | ORAL_TABLET | Freq: Every day | ORAL | 3 refills | Status: DC

## 2020-10-29 MED ORDER — REPAGLINIDE 1 MG PO TABS: ORAL_TABLET | ORAL | 2 refills | Status: DC

## 2020-10-29 NOTE — Telephone Encounter (Signed)
Rx was sent today to [facility]

## 2020-10-29 NOTE — Telephone Encounter (Signed)
MEDICATION: farxiga and repaglinide  PHARMACY:   Electronics engineer  [facility]) - [place], [place] - [address] Phone:  [phone]  Fax:  [phone]      HAS THE PATIENT CONTACTED THEIR PHARMACY?  no  IS THIS A 90 DAY SUPPLY : yes  IS PATIENT OUT OF MEDICATION: yes  IF NOT; HOW MUCH IS LEFT:   LAST APPOINTMENT DATE: @7 /13/2022  NEXT APPOINTMENT DATE:@9 /03/2021  DO WE HAVE YOUR PERMISSION TO LEAVE A DETAILED MESSAGE?: yes    **Let patient know to contact pharmacy at the end of the day to make sure medication is ready. **  ** Please notify patient to allow 48-72 hours to process**  **Encourage patient to contact the pharmacy for refills or they can request refills through [facility]**

## 2020-11-01 ENCOUNTER — Telehealth: Payer: Self-pay | Admitting: Family Medicine

## 2020-11-01 DIAGNOSIS — E7800 Pure hypercholesterolemia, unspecified: Secondary | ICD-10-CM

## 2020-11-01 NOTE — Telephone Encounter (Signed)
What is the name of the medication? atorvastatin (LIPITOR) 80 MG tablet [[id number]]   Have you contacted your pharmacy to request a refill? She is needing a refill on this script.   Which pharmacy would you like this sent to? Pharmacy  [id number] - [place], [place] - [address] AT [facility] OF [address]  [address] [place] [address]  Phone:  [phone]  Fax:  [phone]  DEA #:  [id number]     Patient notified that their request is being sent to the clinical staff for review and that they should receive a call once it is complete. If they do not receive a call within 72 hours they can check with their pharmacy or our office.

## 2020-11-04 ENCOUNTER — Encounter (HOSPITAL_BASED_OUTPATIENT_CLINIC_OR_DEPARTMENT_OTHER): Payer: Self-pay | Admitting: Cardiology

## 2020-11-04 ENCOUNTER — Ambulatory Visit (INDEPENDENT_AMBULATORY_CARE_PROVIDER_SITE_OTHER): Payer: Medicare Other | Admitting: Cardiology

## 2020-11-04 ENCOUNTER — Other Ambulatory Visit: Payer: Self-pay

## 2020-11-04 VITALS — BP 152/82 | HR 74 | Ht 62.0 in | Wt 131.8 lb

## 2020-11-04 DIAGNOSIS — Z79899 Other long term (current) drug therapy: Secondary | ICD-10-CM

## 2020-11-04 DIAGNOSIS — I10 Essential (primary) hypertension: Secondary | ICD-10-CM | POA: Diagnosis not present

## 2020-11-04 DIAGNOSIS — E119 Type 2 diabetes mellitus without complications: Secondary | ICD-10-CM

## 2020-11-04 DIAGNOSIS — Z7189 Other specified counseling: Secondary | ICD-10-CM

## 2020-11-04 DIAGNOSIS — Z8673 Personal history of transient ischemic attack (TIA), and cerebral infarction without residual deficits: Secondary | ICD-10-CM | POA: Diagnosis not present

## 2020-11-04 MED ORDER — ATORVASTATIN CALCIUM 80 MG PO TABS: 80.0000 mg | ORAL_TABLET | Freq: Every day | ORAL | 3 refills | Status: DC

## 2020-11-04 MED ORDER — VALSARTAN 80 MG PO TABS
80.0000 mg | ORAL_TABLET | Freq: Every day | ORAL | 3 refills | Status: DC
Start: 2020-11-04 — End: 2021-06-10

## 2020-11-04 MED ORDER — BLOOD PRESSURE CUFF MISC: 1.0000 [IU] | Freq: Once | 0 refills | Status: AC

## 2020-11-04 NOTE — Progress Notes (Signed)
Cardiology Office Note:    Date:  11/04/2020   ID:  Marie [name], DOB 09/23/1955, MRN [id number]  PCP:  Marie Maw, MD  Cardiologist:  Marie Dresser, MD  Referring MD: Marie [name],*   CC: follow up  History of Present Illness:    Marie [name] is a 65 y.o. female with a hx of CVA, hypertension, type II diabetes, dyslipidemia, carotid stenosis who is seen for follow up today. I initially met her 05/01/19 as a new patient to me/prior patient of Dr. Bettina [name] for the evaluation and management of CVA and hypertension.  She was last seen by Dr. Bettina [name] on 02/04/2018. She has a history of CVA. Workup unrevealing, loop recorder has not shown atrial fibrillation during the entire life of the device (implanted 2015). Her hypertension was well controlled on ACEi and amlodipine.  Today: Her main concern today is her diabetes and subsequent retinal issues. She has received multiple injections, but she has not seen much improvement. If she closes her left eye, she can only see a figure out of her right eye.   During her previous clinic visits, she has been told that her blood pressure is stable. She does not check her blood pressure at home, and does not have a blood pressure cuff. It has been three months since she has not had her lisinopril. She self-discontinued this after running out because she is on many medications.  Minor cuts will cause her to bleed significantly, and this is bothersome for her. Asking about stopping aspirin, I recommended continuing as long as bleeding is self-limited and minor.  She denies any chest pain, shortness of breath, palpitations, or exertional symptoms. No headaches, lightheadedness, or syncope to report. Also has no lower extremity edema, orthopnea or PND.  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 We discussed symptoms to watch for from a cardiovascular standpoint. They are vaccinated against  COVID and typically stay indoors.  Past Medical History:  Diagnosis Date   CVA (cerebral vascular accident) ([place]) 2015   Depression    Diabetes ([place])    t2   Gallstones    Hyperlipemia    Hypertension    Migraines     Past Surgical History:  Procedure Laterality Date   BREAST BIOPSY Right 05/2019   CESAREAN SECTION     x2   CHOLECYSTECTOMY N/A 05/25/2016   Procedure: LAPAROSCOPIC CHOLECYSTECTOMY WITH INTRAOPERATIVE CHOLANGIOGRAM;  Surgeon: Marie Hausen, MD;  [facility];  Service: General;  Laterality: N/A;   IR GENERIC HISTORICAL  03/13/2016   IR PERC CHOLECYSTOSTOMY 03/13/2016 Marie Mariscal, MD [facility]   IR GENERIC HISTORICAL  04/28/2016   IR RADIOLOGIST EVAL & MGMT 04/28/2016 Marie Cadet, MD [facility]   LOOP RECORDER IMPLANT N/A 11/29/2013   Procedure: LOOP RECORDER IMPLANT;  Surgeon: Marie Lance, MD;  Location: [facility] CATH LAB;  Service: Cardiovascular;  Laterality: N/A;   TEE WITHOUT CARDIOVERSION N/A 08/24/2013   Procedure: TRANSESOPHAGEAL ECHOCARDIOGRAM (TEE);  Surgeon: Marie Klein, MD;  Location: [facility] ENDOSCOPY;  Service: Cardiovascular;  Laterality: N/A;    Current Medications: Current Outpatient Medications on File Prior to Visit  Medication Sig   aspirin EC 81 MG tablet Take 81 mg by mouth at bedtime.   atorvastatin (LIPITOR) 80 MG tablet Take 1 tablet (80 mg total) by mouth daily.   Cholecalciferol (VITAMIN D3) 3000 units TABS Take 3,000 Units by mouth at bedtime.   Continuous Blood Gluc Sensor (  FREESTYLE LIBRE 2 SENSOR) MISC 2 Devices by Does not apply route every 14 (fourteen) days.   dapagliflozin propanediol (FARXIGA) 10 MG TABS tablet Take 1 tablet (10 mg total) by mouth daily.   Ginkgo Biloba 500 MG CAPS Take 500 mg by mouth at bedtime.   glucose blood test strip 1 each by Other route as needed for other. Use as instructed   metFORMIN (GLUCOPHAGE-XR) 500 MG 24 hr tablet Take 2 tablets (1,000 mg total) by mouth in the morning and at bedtime.    Multiple Vitamin (MULTIVITAMIN WITH MINERALS) TABS tablet Take 1 tablet by mouth daily.   repaglinide (PRANDIN) 1 MG tablet 2 pills in am and 1 in pm before meals   No current facility-administered medications on file prior to visit.     Allergies:   Penicillins   Social History   Tobacco Use   Smoking status: Never   Smokeless tobacco: Never  Vaping Use   Vaping Use: Never used  Substance Use Topics   Alcohol use: No    Alcohol/week: 1.0 standard drink    Types: 1 Standard drinks or equivalent per week    Comment: Social   Drug use: No    Family History: family history includes Diabetes in her maternal grandfather and mother; High blood pressure in her maternal grandfather and mother; Stomach cancer in her mother. There is no history of Heart disease.  ROS:   Please see the history of present illness.   (+) Loss of vision (+) Easy bleed Additional pertinent ROS otherwise unremarkable.   EKGs/Labs/Other Studies Reviewed:    The following studies were reviewed today:  TEE 08/24/13 - Left ventricle: Systolic function was normal. Wall motion was    normal; there were no regional wall motion abnormalities.  - Left atrium: No evidence of thrombus in the atrial cavity or    appendage.  - Right atrium: No evidence of thrombus in the atrial cavity or    appendage.  - Atrial septum: No defect or patent foramen ovale was identified.    Echo contrast study showed no right-to-left atrial level shunt,    at baseline or with provocation. Echo contrast study showed no    right-to-left shunt, following an increase in RA pressure induced    by provocative maneuvers.    EKG:  EKG is personally reviewed.   11/04/2020: NSR at 74 bpm 11/20/2019: EKG was not ordered. 05/01/19: sinus rhythm with sinus arrhythmia  Recent Labs: 09/24/2020: ALT 24; Hemoglobin 13.8; Platelets 211.0 10/25/2020: BUN 15; Creatinine, Ser 0.73; Potassium 4.6; Sodium 142  Recent Lipid Panel    Component Value  Date/Time   CHOL 136 10/25/2020 0833   TRIG 94.0 10/25/2020 0833   HDL 49.20 10/25/2020 0833   CHOLHDL 3 10/25/2020 0833   VLDL 18.8 10/25/2020 0833   LDLCALC 68 10/25/2020 0833   LDLDIRECT 136.0 09/24/2020 0850    Physical Exam:    VS:  BP (!) 152/82 (BP Location: Left Arm, Patient Position: Sitting)   Pulse 74   Ht _0  (1.575 m)   Wt 131 lb 12.8 oz (59.8 kg)   BMI 24.11 kg/m     Wt Readings from Last 3 Encounters:  11/04/20 131 lb 12.8 oz (59.8 kg)  10/24/20 131 lb 3.2 oz (59.5 kg)  09/26/20 127 lb 6.4 oz (57.8 kg)    GEN: Well nourished, well developed in no acute distress HEENT: Normal, moist mucous membranes NECK: No JVD CARDIAC: regular rhythm, normal S1 and S2, no  rubs or gallops. No murmur. VASCULAR: Radial and DP pulses 2+ bilaterally. No carotid bruits RESPIRATORY:  Clear to auscultation without rales, wheezing or rhonchi  ABDOMEN: Soft, non-tender, non-distended MUSCULOSKELETAL:  Ambulates independently SKIN: Warm and dry, no edema NEUROLOGIC:  Alert and oriented x 3. No focal neuro deficits noted. PSYCHIATRIC:  Normal affect   ASSESSMENT:    1. Essential hypertension   2. Type 2 diabetes mellitus without obesity ([place])   3. History of CVA (cerebrovascular accident)   4. Cardiac risk counseling   5. Counseling on health promotion and disease prevention   6. Medication management     PLAN:    History of CVA: no further events -no further strokes -no detection of afib on loop -continue aspirin, atorvastatin, discussed again today  Hypertension: goal <130/80, elevated today -has not taken lisinopril in about 3 mos, when she ran out. BP elevated today. We discussed options. Will start ARB, recheck BMET in 2 weeks, follow up BP in 6-8 weeks to determine if dose needs to be increased  Type II diabetes, without obesity, with history of ASCVD: -she is already on SGLT2i -with history of stroke, would recommend GLP1RA, but this has been stopped. Defer to  her diabetes team on this. Also on metformin and repaglinide currently  Cardiac risk counseling and secondary prevention recommendations: -recommend heart healthy/Mediterranean diet, with whole grains, fruits, vegetable, fish, lean meats, nuts, and olive oil. Limit salt. -recommend moderate walking, 3-5 times/week for 30-50 minutes each session. Aim for at least 150 minutes.week. Goal should be pace of 3 miles/hours, or walking 1.5 miles in 30 minutes -recommend avoidance of tobacco products. Avoid excess alcohol.  Plan for follow up: 6-8 weeks or sooner PRN  Marie Dresser, MD, PhD [place]  [facility]    Medication Adjustments/Labs and Tests Ordered: Current medicines are reviewed at length with the patient today.  Concerns regarding medicines are outlined above.  Orders Placed This Encounter  Procedures   Basic metabolic panel   EKG 92-EQAS    Meds ordered this encounter  Medications   valsartan (DIOVAN) 80 MG tablet    Sig: Take 1 tablet (80 mg total) by mouth daily.    Dispense:  90 tablet    Refill:  3   Blood Pressure Monitoring (BLOOD PRESSURE CUFF) MISC    Sig: 1 Units by Does not apply route once for 1 dose.    Dispense:  1 each    Refill:  0    Patient Instructions  Medication Instructions:  We are going to start valsartan (replaces lisinopril).   *If you need a refill on your cardiac medications before your next appointment, please call your pharmacy*   Lab Work: Two weeks after you start taking it, come to the lab and we will check your kidney function. You can eat (does not need to be a fasting lab).   Testing/Procedures: None ordered today   Follow-Up: At [facility], you and your health needs are our priority.  As part of our continuing mission to provide you with exceptional heart care, we have created designated Provider Care Teams.  These Care Teams include your primary Cardiologist (physician) and Advanced Practice Providers (APPs  -  Physician Assistants and Nurse Practitioners) who all work together to provide you with the care you need, when you need it.  We recommend signing up for the patient portal called "MyChart".  Sign up information is provided on this After Visit Summary.  MyChart is used to connect  with patients for Virtual Visits (Telemedicine).  Patients are able to view lab/test results, encounter notes, upcoming appointments, etc.  Non-urgent messages can be sent to your provider as well.   To learn more about what you can do with MyChart, go to [email or web address].    Your next appointment:   6-8 week(s)  The format for your next appointment:   In Person  Provider:   Buford Dresser, MD   Other Instructions -how to check blood pressure:  -sit comfortably in a chair, feet uncrossed and flat on floor, for 5-10 minutes  -arm ideally should rest at the level of the heart. However, arm should be relaxed and not tense (for example, do not hold the arm up unsupported)  -avoid exercise, caffeine, and tobacco for at least 30 minutes prior to BP reading  -don't take BP cuff reading over clothes (always place on skin directly)  -I prefer to know how well the medication is working, so I would like you to take your readings 1-2 hours after taking your blood pressure medication if possible   I like the [employer] brand of blood pressure cuff. The arm cuff is better than the wrist cuff.   I,Mathew Stumpf,acting as a Education administrator for [employer], MD.,have documented all relevant documentation on the behalf of Marie Dresser, MD,as directed by  Marie Dresser, MD while in the presence of Marie Dresser, MD.  I, Marie Dresser, MD, have reviewed all documentation for this visit. The documentation on 11/04/20 for the exam, diagnosis, procedures, and orders are all accurate and complete.   Signed, Marie Dresser, MD PhD 11/04/2020  [facility]

## 2020-11-04 NOTE — Telephone Encounter (Signed)
Refill sent in

## 2020-11-04 NOTE — Patient Instructions (Addendum)
Medication Instructions:  We are going to start valsartan (replaces lisinopril).   *If you need a refill on your cardiac medications before your next appointment, please call your pharmacy*   Lab Work: Two weeks after you start taking it, come to the lab and we will check your kidney function. You can eat (does not need to be a fasting lab).   Testing/Procedures: None ordered today   Follow-Up: At [facility], you and your health needs are our priority.  As part of our continuing mission to provide you with exceptional heart care, we have created designated Provider Care Teams.  These Care Teams include your primary Cardiologist (physician) and Advanced Practice Providers (APPs -  Physician Assistants and Nurse Practitioners) who all work together to provide you with the care you need, when you need it.  We recommend signing up for the patient portal called "MyChart".  Sign up information is provided on this After Visit Summary.  MyChart is used to connect with patients for Virtual Visits (Telemedicine).  Patients are able to view lab/test results, encounter notes, upcoming appointments, etc.  Non-urgent messages can be sent to your provider as well.   To learn more about what you can do with MyChart, go to [email or web address].    Your next appointment:   6-8 week(s)  The format for your next appointment:   In Person  Provider:   Jodelle Red, MD   Other Instructions -how to check blood pressure:  -sit comfortably in a chair, feet uncrossed and flat on floor, for 5-10 minutes  -arm ideally should rest at the level of the heart. However, arm should be relaxed and not tense (for example, do not hold the arm up unsupported)  -avoid exercise, caffeine, and tobacco for at least 30 minutes prior to BP reading  -don't take BP cuff reading over clothes (always place on skin directly)  -I prefer to know how well the medication is working, so I would like you to take your  readings 1-2 hours after taking your blood pressure medication if possible   I like the Omron brand of blood pressure cuff. The arm cuff is better than the wrist cuff.

## 2020-11-05 ENCOUNTER — Encounter (INDEPENDENT_AMBULATORY_CARE_PROVIDER_SITE_OTHER): Payer: Self-pay | Admitting: Ophthalmology

## 2020-11-05 ENCOUNTER — Ambulatory Visit (INDEPENDENT_AMBULATORY_CARE_PROVIDER_SITE_OTHER): Payer: Medicare Other | Admitting: Ophthalmology

## 2020-11-05 DIAGNOSIS — E113411 Type 2 diabetes mellitus with severe nonproliferative diabetic retinopathy with macular edema, right eye: Secondary | ICD-10-CM

## 2020-11-05 NOTE — Progress Notes (Signed)
11/05/2020     CHIEF COMPLAINT Patient presents for Retina Follow Up (3 week fu od and discussion of Iluvien/Pt states, "I really do feel like my vision is a little better."/A1C: 7.4/LBS: 160)   HISTORY OF PRESENT ILLNESS: Marie [name] is a 65 y.o. female who presents to the clinic today for:   HPI     Retina Follow Up           Diagnosis: Diabetic Retinopathy   Laterality: right eye   Onset: 3 weeks ago   Severity: mild   Duration: 3 weeks   Course: gradually improving   Comments: 3 week fu od and discussion of Iluvien Pt states, "I really do feel like my vision is a little better." A1C: 7.4 LBS: 160       Last edited by Demetrios Loll, COA on 11/05/2020  2:58 PM.      Referring physician: Mliss Sax, MD [address] [place],  [place] [address]  HISTORICAL INFORMATION:   Selected notes from the MEDICAL RECORD NUMBER    Lab Results  Component Value Date   HGBA1C 7.4 (H) 10/25/2020     CURRENT MEDICATIONS: No current outpatient medications on file. (Ophthalmic Drugs)   No current facility-administered medications for this visit. (Ophthalmic Drugs)   Current Outpatient Medications (Other)  Medication Sig   aspirin EC 81 MG tablet Take 81 mg by mouth at bedtime.   atorvastatin (LIPITOR) 80 MG tablet Take 1 tablet (80 mg total) by mouth daily.   Cholecalciferol (VITAMIN D3) 3000 units TABS Take 3,000 Units by mouth at bedtime.   Continuous Blood Gluc Sensor (FREESTYLE LIBRE 2 SENSOR) MISC 2 Devices by Does not apply route every 14 (fourteen) days.   dapagliflozin propanediol (FARXIGA) 10 MG TABS tablet Take 1 tablet (10 mg total) by mouth daily.   Ginkgo Biloba 500 MG CAPS Take 500 mg by mouth at bedtime.   glucose blood test strip 1 each by Other route as needed for other. Use as instructed   metFORMIN (GLUCOPHAGE-XR) 500 MG 24 hr tablet Take 2 tablets (1,000 mg total) by mouth in the morning and at bedtime.   Multiple Vitamin (MULTIVITAMIN  WITH MINERALS) TABS tablet Take 1 tablet by mouth daily.   repaglinide (PRANDIN) 1 MG tablet 2 pills in am and 1 in pm before meals   valsartan (DIOVAN) 80 MG tablet Take 1 tablet (80 mg total) by mouth daily.   No current facility-administered medications for this visit. (Other)      REVIEW OF SYSTEMS:    ALLERGIES Allergies  Allergen Reactions   Penicillins Other (See Comments)    Driving couldn't see and head felt heavy  Has patient had a PCN reaction causing immediate rash, facial/tongue/throat swelling, SOB or lightheadedness with hypotension: No Has patient had a PCN reaction causing severe rash involving mucus membranes or skin necrosis: No Has patient had a PCN reaction that required hospitalization: No Has patient had a PCN reaction occurring within the last 10 years: No If all of the above answers are "NO", then may proceed with Cephalosporin use.     PAST MEDICAL HISTORY Past Medical History:  Diagnosis Date   CVA (cerebral vascular accident) (HCC) 2015   Depression    Diabetes (HCC)    t2   Gallstones    Hyperlipemia    Hypertension    Migraines    Past Surgical History:  Procedure Laterality Date   BREAST BIOPSY Right 05/2019   CESAREAN  SECTION     x2   CHOLECYSTECTOMY N/A 05/25/2016   Procedure: LAPAROSCOPIC CHOLECYSTECTOMY WITH INTRAOPERATIVE CHOLANGIOGRAM;  Surgeon: Luretha Murphy, MD;  Location: [facility] ORS;  Service: General;  Laterality: N/A;   IR GENERIC HISTORICAL  03/13/2016   IR PERC CHOLECYSTOSTOMY 03/13/2016 [name], MD [facility]INTER[facility] RAD   IR GENERIC HISTORICAL  04/28/2016   IR RADIOLOGIST EVAL & MGMT 04/28/2016 [name], MD [facility] INTER[facility] RAD   LOOP RECORDER IMPLANT N/A 11/29/2013   Procedure: LOOP RECORDER IMPLANT;  Surgeon: Marinus Maw, MD;  Location: [facility] CATH LAB;  Service: Cardiovascular;  Laterality: N/A;   TEE WITHOUT CARDIOVERSION N/A 08/24/2013   Procedure: TRANSESOPHAGEAL ECHOCARDIOGRAM (TEE);  Surgeon: [name], MD;  Location:  [facility] ENDOSCOPY;  Service: Cardiovascular;  Laterality: N/A;    FAMILY HISTORY Family History  Problem Relation Age of Onset   Stomach cancer Mother    High blood pressure Mother    Diabetes Mother    Diabetes Maternal Grandfather    High blood pressure Maternal Grandfather    Heart disease Neg Hx     SOCIAL HISTORY Social History   Tobacco Use   Smoking status: Never   Smokeless tobacco: Never  Vaping Use   Vaping Use: Never used  Substance Use Topics   Alcohol use: No    Alcohol/week: 1.0 standard drink    Types: 1 Standard drinks or equivalent per week    Comment: Social   Drug use: No         OPHTHALMIC EXAM:  Base Eye Exam     Visual Acuity (ETDRS)       Right Left   Dist [place] CF at 3' 20/20         Tonometry (Tonopen, 3:01 PM)       Right Left   Pressure 14 13         Pupils       Pupils Dark Light Shape React APD   Right PERRL 6 5 Round Brisk None   Left PERRL 6 5 Round Brisk None         Visual Fields (Counting fingers)       Left Right    Full Full         Extraocular Movement       Right Left    Full Full         Neuro/Psych     Oriented x3: Yes   Mood/Affect: Normal         Dilation     Right eye: 1.0% Mydriacyl, 2.5% Phenylephrine @ 3:01 PM           Slit Lamp and Fundus Exam     External Exam       Right Left   External Normal Normal         Slit Lamp Exam       Right Left   Lids/Lashes Normal Normal   Conjunctiva/Sclera White and quiet White and quiet   Cornea Clear Clear   Anterior Chamber Deep and quiet Deep and quiet   Iris Round and reactive Round and reactive   Lens Posterior chamber intraocular lens, , 2+ Posterior capsular opacification 2+ Nuclear sclerosis, Cortical cataract   Anterior Vitreous Normal Normal         Fundus Exam       Right Left   Posterior Vitreous Posterior vitreous detachment, Central vitreous floaters    Disc Normal    C/D Ratio 0.5    Macula Focal  laser  scars,  macular thickening, Microaneurysms, much less    Vessels Retinopathy,, severe NPDR    Periphery Normal, good prp, room 360,,, for more laser in the future, posteriorly, and nasally             IMAGING AND PROCEDURES  Imaging and Procedures for 11/05/20  OCT, Retina - OU - Both Eyes       Right Eye Quality was good. Scan locations included subfoveal. Central Foveal Thickness: 765. Progression has worsened. Findings include abnormal foveal contour, cystoid macular edema.   Left Eye Quality was good. Scan locations included subfoveal. Central Foveal Thickness: 333. Progression has been stable. Findings include abnormal foveal contour.   Notes Recurrence of massive CSME and CME OD post resolution of beneficial effect of Ozurdex OD. OS with no CSME detected             ASSESSMENT/PLAN:  Severe nonproliferative diabetic retinopathy of right eye, with macular edema, associated with type 2 diabetes mellitus (HCC) Recurrence of CSME despite Ozurdex use on 2 occasions.  No history of elevation of intraocular pressure on the 2 occasions use of Ozurdex, yet with near complete resolution anatomically of CSME centrally OD  Thus excellent candidate for use of intravitreal Iluvien, and precertification process has been undergoing, with benefits analysis as well as resection today from health management, of the typical charge and reimbursement  Will order for ready within 2 weeks intravitreal Iluvien OD  I described with the patient the potential risk for developing of glaucoma over the next 2 years including the management possible with drops medications or more rarely surgical intervention.  I explained to the patient the fact that no prior to Ozurdex injections did not result in elevation of intraocular pressure is a excellent predictor that glaucoma will not develop but no guarantee.     ICD-10-CM   1. Severe nonproliferative diabetic retinopathy of right eye, with macular  edema, associated with type 2 diabetes mellitus (HCC)  E11.3411 OCT, Retina - OU - Both Eyes      1.  Stable CSME OD, still active, will need to plan intravitreal injection Iluvien implant right eye.  Risk and benefits reviewed as described  As above.    2.  Dilate OD next, in preparation for Iluvien implant 3.  Ophthalmic Meds Ordered this visit:  No orders of the defined types were placed in this encounter.      Return in about 2 weeks (around 11/19/2020) for dilate, OD, OCT, injection Iluvien implant.  There are no Patient Instructions on file for this visit.   Explained the diagnoses, plan, and follow up with the patient and they expressed understanding.  Patient expressed understanding of the importance of proper follow up care.   Alford Highland Andron Marrazzo M.D. Diseases & Surgery of the Retina and Vitreous [facility] 11/05/20     Abbreviations: M myopia (nearsighted); A astigmatism; H hyperopia (farsighted); P presbyopia; Mrx spectacle prescription;  CTL contact lenses; OD right eye; OS left eye; OU both eyes  XT exotropia; ET esotropia; PEK punctate epithelial keratitis; PEE punctate epithelial erosions; DES dry eye syndrome; MGD meibomian gland dysfunction; ATs artificial tears; PFAT's preservative free artificial tears; NSC nuclear sclerotic cataract; PSC posterior subcapsular cataract; ERM epi-retinal membrane; PVD posterior vitreous detachment; RD retinal detachment; DM diabetes mellitus; DR diabetic retinopathy; NPDR non-proliferative diabetic retinopathy; PDR proliferative diabetic retinopathy; CSME clinically significant macular edema; DME diabetic macular edema; dbh dot blot hemorrhages; CWS cotton wool spot; POAG primary  open angle glaucoma; C/D cup-to-disc ratio; HVF humphrey visual field; GVF goldmann visual field; OCT optical coherence tomography; IOP intraocular pressure; BRVO Branch retinal vein occlusion; CRVO central retinal vein occlusion; CRAO central  retinal artery occlusion; BRAO branch retinal artery occlusion; RT retinal tear; SB scleral buckle; PPV pars plana vitrectomy; VH Vitreous hemorrhage; PRP panretinal laser photocoagulation; IVK intravitreal kenalog; VMT vitreomacular traction; MH Macular hole;  NVD neovascularization of the disc; NVE neovascularization elsewhere; AREDS age related eye disease study; ARMD age related macular degeneration; POAG primary open angle glaucoma; EBMD epithelial/anterior basement membrane dystrophy; ACIOL anterior chamber intraocular lens; IOL intraocular lens; PCIOL posterior chamber intraocular lens; Phaco/IOL phacoemulsification with intraocular lens placement; PRK photorefractive keratectomy; LASIK laser assisted in situ keratomileusis; HTN hypertension; DM diabetes mellitus; COPD chronic obstructive pulmonary disease

## 2020-11-05 NOTE — Assessment & Plan Note (Signed)
Recurrence of CSME despite Ozurdex use on 2 occasions.  No history of elevation of intraocular pressure on the 2 occasions use of Ozurdex, yet with near complete resolution anatomically of CSME centrally OD  Thus excellent candidate for use of intravitreal Iluvien, and precertification process has been undergoing, with benefits analysis as well as resection today from health management, of the typical charge and reimbursement  Will order for ready within 2 weeks intravitreal Iluvien OD  I described with the patient the potential risk for developing of glaucoma over the next 2 years including the management possible with drops medications or more rarely surgical intervention.  I explained to the patient the fact that no prior to Ozurdex injections did not result in elevation of intraocular pressure is a excellent predictor that glaucoma will not develop but no guarantee.

## 2020-11-06 NOTE — Telephone Encounter (Signed)
Mailed medical records Pt was supposed to pick up January.

## 2020-11-18 ENCOUNTER — Ambulatory Visit (INDEPENDENT_AMBULATORY_CARE_PROVIDER_SITE_OTHER): Payer: Medicare Other | Admitting: Ophthalmology

## 2020-11-18 ENCOUNTER — Other Ambulatory Visit: Payer: Self-pay

## 2020-11-18 ENCOUNTER — Encounter (INDEPENDENT_AMBULATORY_CARE_PROVIDER_SITE_OTHER): Payer: Self-pay | Admitting: Ophthalmology

## 2020-11-18 DIAGNOSIS — E113411 Type 2 diabetes mellitus with severe nonproliferative diabetic retinopathy with macular edema, right eye: Secondary | ICD-10-CM

## 2020-11-18 MED ORDER — FLUOCINOLONE ACETONIDE 0.19 MG IZ IMPL
0.1900 mg | DRUG_IMPLANT | INTRAVITREAL | Status: AC | PRN
  Administered 2020-11-18: .19 mg via INTRAVITREAL

## 2020-11-18 NOTE — Assessment & Plan Note (Signed)
OD with CSME resistant to all forms of antivegF.  Sensitive to Ozurdex in the past yet still with recurrence post Ozurdex affect waning  Prove that there is no steroid sensitivity to elevated intraocular pressure  We will use Iluvien injectable implant OD today  Informed consent reviewed with the patient

## 2020-11-18 NOTE — Progress Notes (Signed)
11/18/2020     CHIEF COMPLAINT Patient presents for Retina Follow Up   HISTORY OF PRESENT ILLNESS: Marie [name] is a 65 y.o. female who presents to the clinic today for:   HPI     Retina Follow Up           Diagnosis: Diabetic Retinopathy   Laterality: right eye   Onset: 2 weeks ago   Severity: mild   Duration: 2 weeks   Course: stable         Comments   2 week fu OD OCT and Iluuvien steroid implant OD Pt states VA OU stable since last visit. Pt denies FOL, floaters, or ocular pain OU.  LBS:       Last edited by Demetrios LollBusick, Erica L, COA on 11/18/2020  2:44 PM.      Referring physician: Mliss SaxKremer, William Alfred, MD [address] [place] [id number]  HISTORICAL INFORMATION:   Selected notes from the MEDICAL RECORD NUMBER    Lab Results  Component Value Date   HGBA1C 7.4 (H) 10/25/2020     CURRENT MEDICATIONS: No current outpatient medications on file. (Ophthalmic Drugs)   No current facility-administered medications for this visit. (Ophthalmic Drugs)   Current Outpatient Medications (Other)  Medication Sig   aspirin EC 81 MG tablet Take 81 mg by mouth at bedtime.   atorvastatin (LIPITOR) 80 MG tablet Take 1 tablet (80 mg total) by mouth daily.   Cholecalciferol (VITAMIN D3) 3000 units TABS Take 3,000 Units by mouth at bedtime.   Continuous Blood Gluc Sensor (FREESTYLE LIBRE 2 SENSOR) MISC 2 Devices by Does not apply route every 14 (fourteen) days.   dapagliflozin propanediol (FARXIGA) 10 MG TABS tablet Take 1 tablet (10 mg total) by mouth daily.   Ginkgo Biloba 500 MG CAPS Take 500 mg by mouth at bedtime.   glucose blood test strip 1 each by Other route as needed for other. Use as instructed   metFORMIN (GLUCOPHAGE-XR) 500 MG 24 hr tablet Take 2 tablets (1,000 mg total) by mouth in the morning and at bedtime.   Multiple Vitamin (MULTIVITAMIN WITH MINERALS) TABS tablet Take 1 tablet by mouth daily.   repaglinide (PRANDIN) 1 MG tablet 2 pills in am  and 1 in pm before meals   valsartan (DIOVAN) 80 MG tablet Take 1 tablet (80 mg total) by mouth daily.   No current facility-administered medications for this visit. (Other)      REVIEW OF SYSTEMS:    ALLERGIES Allergies  Allergen Reactions   Penicillins Other (See Comments)    Driving couldn't see and head felt heavy  Has patient had a PCN reaction causing immediate rash, facial/tongue/throat swelling, SOB or lightheadedness with hypotension: No Has patient had a PCN reaction causing severe rash involving mucus membranes or skin necrosis: No Has patient had a PCN reaction that required hospitalization: No Has patient had a PCN reaction occurring within the last 10 years: No If all of the above answers are "NO", then may proceed with Cephalosporin use.     PAST MEDICAL HISTORY Past Medical History:  Diagnosis Date   CVA (cerebral vascular accident) (HCC) 2015   Depression    Diabetes (HCC)    t2   Gallstones    Hyperlipemia    Hypertension    Migraines    Past Surgical History:  Procedure Laterality Date   BREAST BIOPSY Right 05/2019   CESAREAN SECTION     x2   CHOLECYSTECTOMY N/A 05/25/2016  Procedure: LAPAROSCOPIC CHOLECYSTECTOMY WITH INTRAOPERATIVE CHOLANGIOGRAM;  Surgeon: Luretha Murphy, MD;  Location: [facility]S;  Service: General;  Laterality: N/A;   IR GENERIC HISTORICAL  03/13/2016   IR PERC CHOLECYSTOSTOMY 03/13/2016 [name], MD [facility]INTER[facility] RAD   IR GENERIC HISTORICAL  04/28/2016   IR RADIOLOGIST EVAL & MGMT 04/28/2016 [name], MD [facility]   LOOP RECORDER IMPLANT N/A 11/29/2013   Procedure: LOOP RECORDER IMPLANT;  Surgeon: Marinus Maw, MD;  Location: [facility] CATH LAB;  Service: Cardiovascular;  Laterality: N/A;   TEE WITHOUT CARDIOVERSION N/A 08/24/2013   Procedure: TRANSESOPHAGEAL ECHOCARDIOGRAM (TEE);  Surgeon: [name], MD;  Location: [facility] ENDOSCOPY;  Service: Cardiovascular;  Laterality: N/A;    FAMILY HISTORY Family History  Problem  Relation Age of Onset   Stomach cancer Mother    High blood pressure Mother    Diabetes Mother    Diabetes Maternal Grandfather    High blood pressure Maternal Grandfather    Heart disease Neg Hx     SOCIAL HISTORY Social History   Tobacco Use   Smoking status: Never   Smokeless tobacco: Never  Vaping Use   Vaping Use: Never used  Substance Use Topics   Alcohol use: No    Alcohol/week: 1.0 standard drink    Types: 1 Standard drinks or equivalent per week    Comment: Social   Drug use: No         OPHTHALMIC EXAM:  Base Eye Exam     Visual Acuity (ETDRS)       Right Left   Dist [place] CF at 3' 20/25 +2         Tonometry (Tonopen, 2:47 PM)       Right Left   Pressure 12 12         Pupils       Pupils Dark Light Shape React APD   Right PERRL 6 5 Round Brisk None   Left PERRL 6 5 Round Brisk None         Visual Fields (Counting fingers)       Left Right    Full Full         Extraocular Movement       Right Left    Full Full         Neuro/Psych     Oriented x3: Yes   Mood/Affect: Normal         Dilation     Right eye: 1.0% Mydriacyl, 2.5% Phenylephrine @ 2:47 PM           Slit Lamp and Fundus Exam     External Exam       Right Left   External Normal Normal         Slit Lamp Exam       Right Left   Lids/Lashes Normal Normal   Conjunctiva/Sclera White and quiet White and quiet   Cornea Clear Clear   Anterior Chamber Deep and quiet Deep and quiet   Iris Round and reactive Round and reactive   Lens Posterior chamber intraocular lens, , 2+ Posterior capsular opacification 2+ Nuclear sclerosis, Cortical cataract   Anterior Vitreous Normal Normal         Fundus Exam       Right Left   Posterior Vitreous Posterior vitreous detachment, Central vitreous floaters    Disc Normal    C/D Ratio 0.5    Macula Focal laser scars,  macular thickening, Microaneurysms, much less    Vessels  Retinopathy,, severe NPDR     Periphery Normal, good prp, room 360,,, for more laser in the future, posteriorly, and nasally             IMAGING AND PROCEDURES  Imaging and Procedures for 11/18/20  OCT, Retina - OU - Both Eyes       Right Eye Quality was good. Scan locations included subfoveal. Central Foveal Thickness: 710. Progression has worsened. Findings include abnormal foveal contour, cystoid macular edema.   Left Eye Quality was good. Scan locations included subfoveal. Central Foveal Thickness: 328. Progression has been stable. Findings include abnormal foveal contour.   Notes Recurrence of massive CSME and CME OD post resolution of beneficial effect of Ozurdex OD.  Will deliver intravitreal Iluvien implant OD today OS with no CSME detected     Intravitreal Injection, Pharmacologic Agent - OD - Right Eye       Time Out 11/18/2020. 2:54 PM. Confirmed correct patient, procedure, site, and patient consented.   Anesthesia Topical anesthesia was used. Anesthetic medications included Akten 3.5%.   Procedure Preparation included 5% betadine to ocular surface, 10% betadine to eyelids. A supplied needle was used.   Injection: 0.19 mg Fluocinolone Acetonide 0.19 MG   Route: Intravitreal, Site: Right Eye   NDC: [id number], Lot: [id number], Expiration date: 04/06/2022   Post-op Post injection exam found visual acuity of at least counting fingers. The patient tolerated the procedure well. There were no complications. The patient received written and verbal post procedure care education. Post injection medications were not given.   Notes We completed the injection without complication, eyewash applied to diminish the effect of acting long-term followed thereafter by another antibiotic eyedrop             ASSESSMENT/PLAN:  Severe nonproliferative diabetic retinopathy of right eye, with macular edema, associated with type 2 diabetes mellitus (HCC) OD with CSME resistant to all forms of antivegF.   Sensitive to Ozurdex in the past yet still with recurrence post Ozurdex affect waning  Prove that there is no steroid sensitivity to elevated intraocular pressure  We will use Iluvien injectable implant OD today  Informed consent reviewed with the patient     ICD-10-CM   1. Severe nonproliferative diabetic retinopathy of right eye, with macular edema, associated with type 2 diabetes mellitus (HCC)  E11.3411 OCT, Retina - OU - Both Eyes    Intravitreal Injection, Pharmacologic Agent - OD - Right Eye    Fluocinolone Acetonide IMPL 0.19 mg      1.  Procedure OD completed without difficulty.  Confirmational implant is in the vitreous cavity with 20 diopter and 28 diopter lens  2.  Follow-up in 1 week look for signs of inflammation  3.  Ophthalmic Meds Ordered this visit:  Meds ordered this encounter  Medications   Fluocinolone Acetonide IMPL 0.19 mg       Return in about 1 week (around 11/25/2020) for dilate, OD, OCT.  There are no Patient Instructions on file for this visit.   Explained the diagnoses, plan, and follow up with the patient and they expressed understanding.  Patient expressed understanding of the importance of proper follow up care.   Alford Highland Missy Baksh M.D. Diseases & Surgery of the Retina and Vitreous [facility] 11/18/20     Abbreviations: M myopia (nearsighted); A astigmatism; H hyperopia (farsighted); P presbyopia; Mrx spectacle prescription;  CTL contact lenses; OD right eye; OS left eye; OU both eyes  XT exotropia; ET esotropia; PEK  punctate epithelial keratitis; PEE punctate epithelial erosions; DES dry eye syndrome; MGD meibomian gland dysfunction; ATs artificial tears; PFAT's preservative free artificial tears; NSC nuclear sclerotic cataract; PSC posterior subcapsular cataract; ERM epi-retinal membrane; PVD posterior vitreous detachment; RD retinal detachment; DM diabetes mellitus; DR diabetic retinopathy; NPDR non-proliferative diabetic  retinopathy; PDR proliferative diabetic retinopathy; CSME clinically significant macular edema; DME diabetic macular edema; dbh dot blot hemorrhages; CWS cotton wool spot; POAG primary open angle glaucoma; C/D cup-to-disc ratio; HVF humphrey visual field; GVF goldmann visual field; OCT optical coherence tomography; IOP intraocular pressure; BRVO Branch retinal vein occlusion; CRVO central retinal vein occlusion; CRAO central retinal artery occlusion; BRAO branch retinal artery occlusion; RT retinal tear; SB scleral buckle; PPV pars plana vitrectomy; VH Vitreous hemorrhage; PRP panretinal laser photocoagulation; IVK intravitreal kenalog; VMT vitreomacular traction; MH Macular hole;  NVD neovascularization of the disc; NVE neovascularization elsewhere; AREDS age related eye disease study; ARMD age related macular degeneration; POAG primary open angle glaucoma; EBMD epithelial/anterior basement membrane dystrophy; ACIOL anterior chamber intraocular lens; IOL intraocular lens; PCIOL posterior chamber intraocular lens; Phaco/IOL phacoemulsification with intraocular lens placement; PRK photorefractive keratectomy; LASIK laser assisted in situ keratomileusis; HTN hypertension; DM diabetes mellitus; COPD chronic obstructive pulmonary disease

## 2020-11-19 ENCOUNTER — Encounter (INDEPENDENT_AMBULATORY_CARE_PROVIDER_SITE_OTHER): Payer: Medicare Other | Admitting: Ophthalmology

## 2020-11-20 ENCOUNTER — Telehealth: Payer: Self-pay | Admitting: Family Medicine

## 2020-11-20 NOTE — Telephone Encounter (Signed)
Spoke with patient who verbally understood that she have aged out of Polio vaccine. Shot record printed off.

## 2020-11-20 NOTE — Telephone Encounter (Signed)
Pt called and wanted to know if we could look up to see if she has gotten her 3 shots of polio and call her and let her know. Please advise

## 2020-11-22 IMAGING — MG DIGITAL SCREENING BILAT W/ TOMO W/ CAD
8 series · 8 of 24 positions shown · non-contrast
Comparison: None.

CLINICAL DATA: Screening.

EXAM:
DIGITAL SCREENING BILATERAL MAMMOGRAM WITH TOMO AND CAD

[R MLO synth-2D]
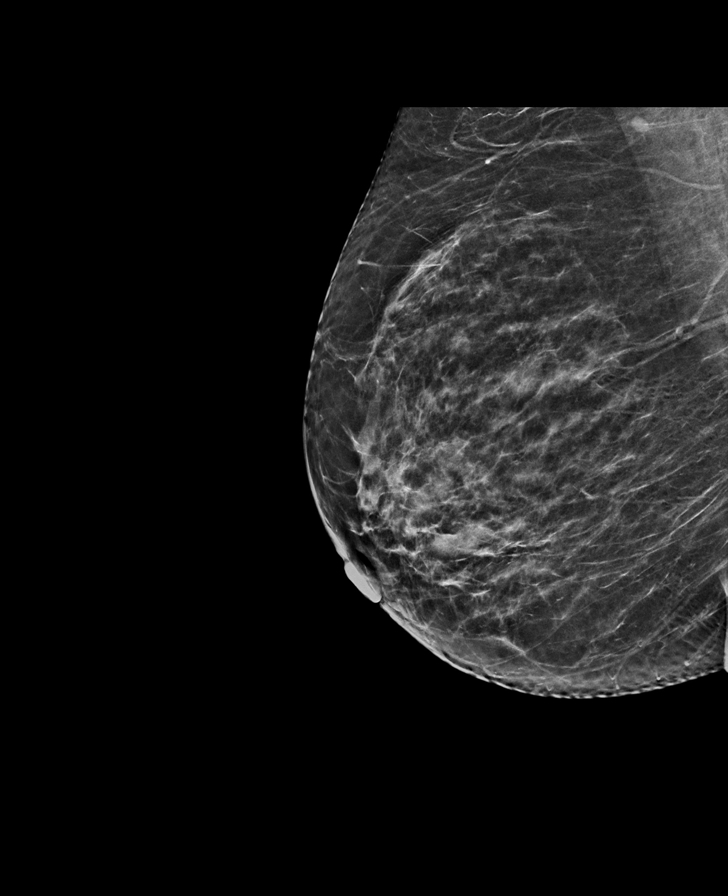

[L CC synth-2D]
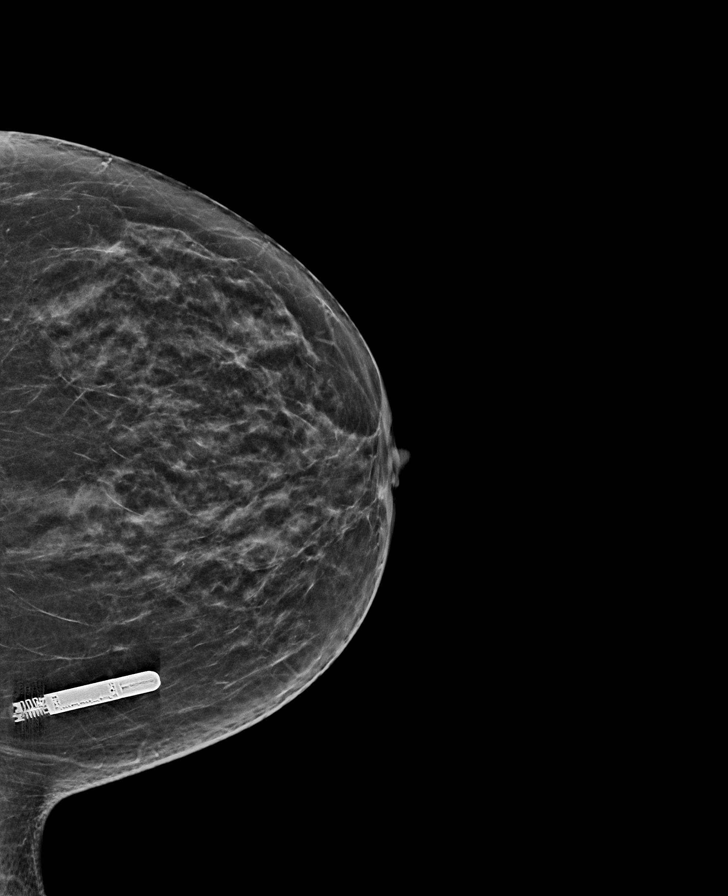

[L MLO synth-2D]
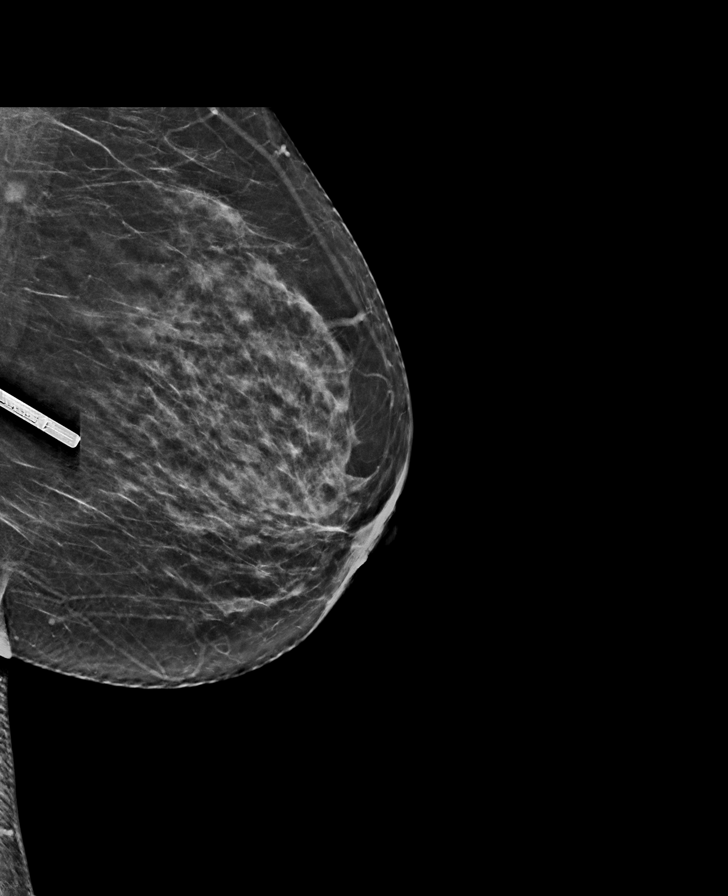

[R CC synth-2D]
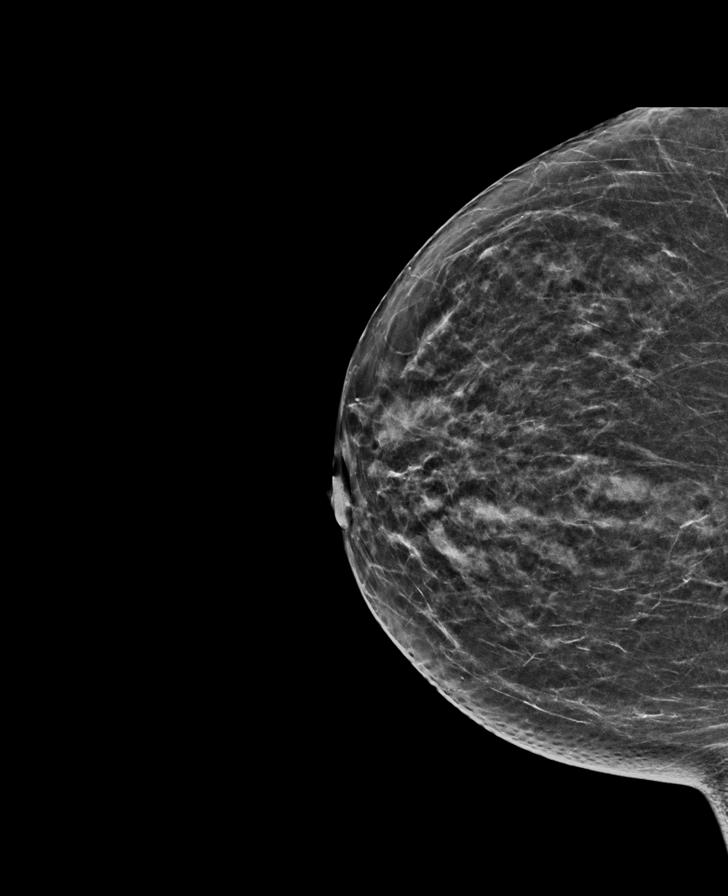

[R CC tomo · tomo slice 30/59.0]
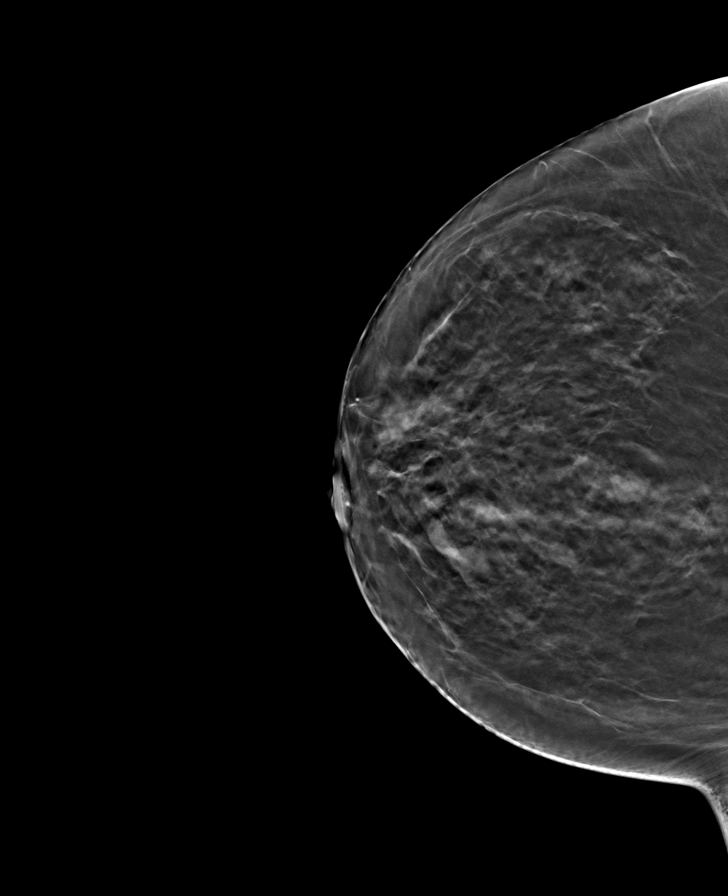

[R MLO tomo · tomo slice 33/65.0]
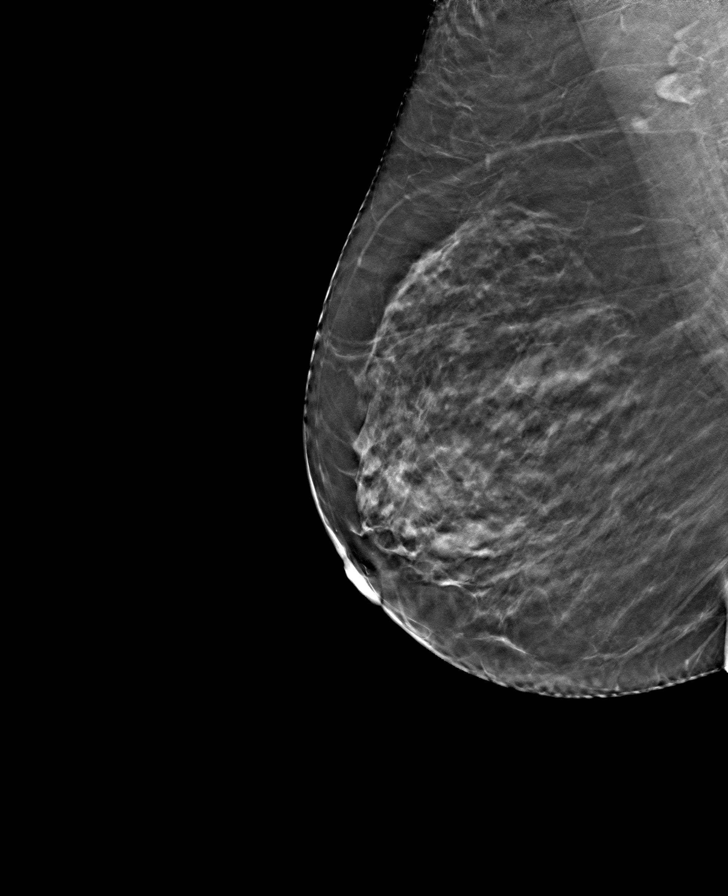

[L MLO tomo · tomo slice 32/63.0]
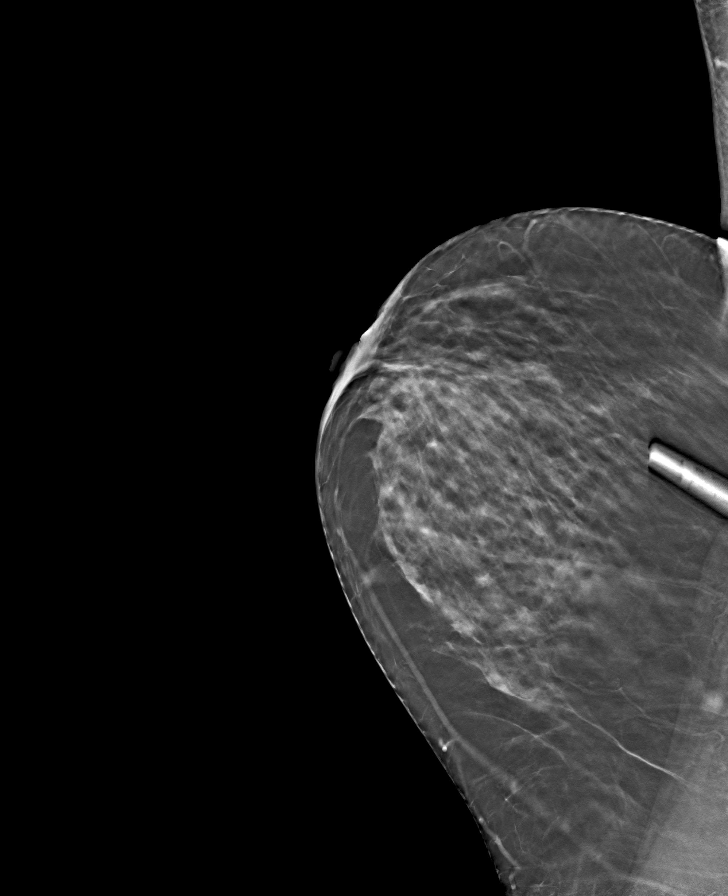

[L CC tomo · tomo slice 29/58.0]
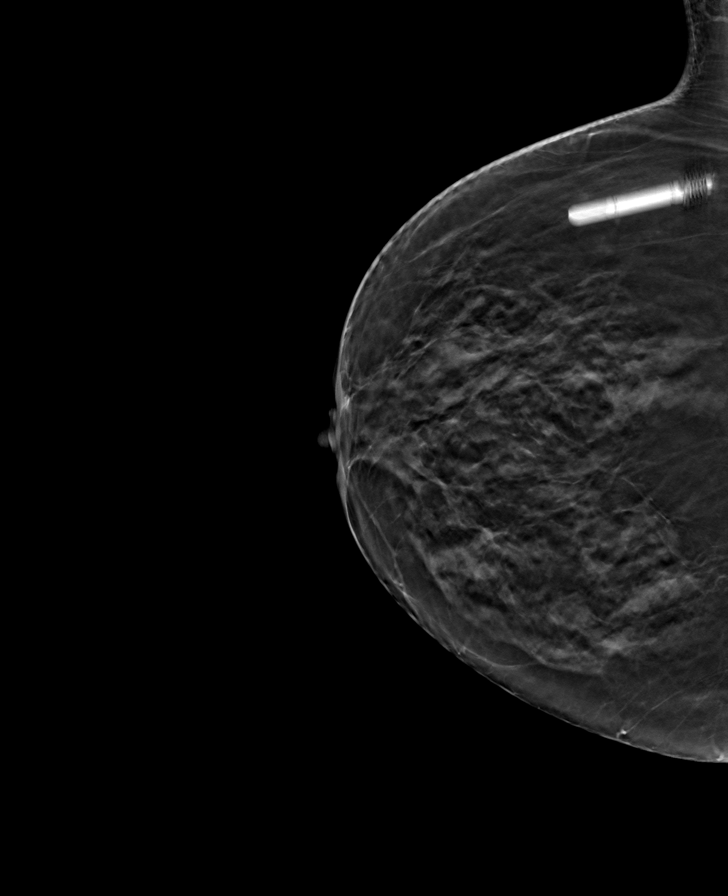

[8 of 24 positions shown; findings below may reference images not displayed]

ACR Breast Density Category b: There are scattered areas of
fibroglandular density.
FINDINGS: In the right breast 2 possible masses requires further evaluation.

In the left breast a possible asymmetry requires further evaluation.

Images were processed with CAD.
IMPRESSION: Further evaluation is suggested for 2 possible masses in the right
breast.

Further evaluation is suggested for possible asymmetry in the left
breast.

RECOMMENDATION:
Diagnostic mammogram and possibly ultrasound of both breasts.
(Code:[id number])

The patient will be contacted regarding the findings, and additional
imaging will be scheduled.

BI-RADS CATEGORY  0: Incomplete. Need additional imaging evaluation
and/or prior mammograms for comparison.

## 2020-11-25 ENCOUNTER — Other Ambulatory Visit: Payer: Self-pay

## 2020-11-25 ENCOUNTER — Ambulatory Visit (INDEPENDENT_AMBULATORY_CARE_PROVIDER_SITE_OTHER): Payer: Medicare Other | Admitting: Ophthalmology

## 2020-11-25 ENCOUNTER — Encounter (INDEPENDENT_AMBULATORY_CARE_PROVIDER_SITE_OTHER): Payer: Self-pay | Admitting: Ophthalmology

## 2020-11-25 DIAGNOSIS — H26491 Other secondary cataract, right eye: Secondary | ICD-10-CM

## 2020-11-25 DIAGNOSIS — E113411 Type 2 diabetes mellitus with severe nonproliferative diabetic retinopathy with macular edema, right eye: Secondary | ICD-10-CM | POA: Diagnosis not present

## 2020-11-25 NOTE — Progress Notes (Signed)
11/25/2020     CHIEF COMPLAINT Patient presents for  Chief Complaint  Patient presents with   Retina Follow Up      HISTORY OF PRESENT ILLNESS: Marie [name] is a 65 y.o. female who presents to the clinic today for:   HPI     Retina Follow Up           Diagnosis: Diabetic Retinopathy   Laterality: right eye   Onset: 1 week ago   Severity: severe   Duration: 1 week         Comments   1 week f/u OD with OCT after injection of Iluvien implant for chronic resistant CME/CSME of severe NPDR  Pt states her vision starting to improve some in the right eye since previous visit. She states she is now able to make out shapes, no details. Pt states that when she rubs her right eye some, her vision improves very slightly. Pt denies having any eye pain in or around the eye. Pt denies any floaters, FOL, dark curtain/veil.   Eye Meds: None      Last edited by Edmon Crape, MD on 11/25/2020  2:20 PM.      Referring physician: Olivia Canter, MD [address] [place],  [place] [address]  HISTORICAL INFORMATION:   Selected notes from the MEDICAL RECORD NUMBER    Lab Results  Component Value Date   HGBA1C 7.4 (H) 10/25/2020     CURRENT MEDICATIONS: No current outpatient medications on file. (Ophthalmic Drugs)   No current facility-administered medications for this visit. (Ophthalmic Drugs)   Current Outpatient Medications (Other)  Medication Sig   aspirin EC 81 MG tablet Take 81 mg by mouth at bedtime.   atorvastatin (LIPITOR) 80 MG tablet Take 1 tablet (80 mg total) by mouth daily.   Cholecalciferol (VITAMIN D3) 3000 units TABS Take 3,000 Units by mouth at bedtime.   Continuous Blood Gluc Sensor (FREESTYLE LIBRE 2 SENSOR) MISC 2 Devices by Does not apply route every 14 (fourteen) days.   dapagliflozin propanediol (FARXIGA) 10 MG TABS tablet Take 1 tablet (10 mg total) by mouth daily.   Ginkgo Biloba 500 MG CAPS Take 500 mg by mouth at bedtime.   glucose  blood test strip 1 each by Other route as needed for other. Use as instructed   metFORMIN (GLUCOPHAGE-XR) 500 MG 24 hr tablet Take 2 tablets (1,000 mg total) by mouth in the morning and at bedtime.   Multiple Vitamin (MULTIVITAMIN WITH MINERALS) TABS tablet Take 1 tablet by mouth daily.   repaglinide (PRANDIN) 1 MG tablet 2 pills in am and 1 in pm before meals   valsartan (DIOVAN) 80 MG tablet Take 1 tablet (80 mg total) by mouth daily.   No current facility-administered medications for this visit. (Other)      REVIEW OF SYSTEMS:    ALLERGIES Allergies  Allergen Reactions   Penicillins Other (See Comments)    Driving couldn't see and head felt heavy  Has patient had a PCN reaction causing immediate rash, facial/tongue/throat swelling, SOB or lightheadedness with hypotension: No Has patient had a PCN reaction causing severe rash involving mucus membranes or skin necrosis: No Has patient had a PCN reaction that required hospitalization: No Has patient had a PCN reaction occurring within the last 10 years: No If all of the above answers are "NO", then may proceed with Cephalosporin use.     PAST MEDICAL HISTORY Past Medical History:  Diagnosis Date  CVA (cerebral vascular accident) (HCC) 2015   Depression    Diabetes (HCC)    t2   Gallstones    Hyperlipemia    Hypertension    Migraines    Past Surgical History:  Procedure Laterality Date   BREAST BIOPSY Right 05/2019   CESAREAN SECTION     x2   CHOLECYSTECTOMY N/A 05/25/2016   Procedure: LAPAROSCOPIC CHOLECYSTECTOMY WITH INTRAOPERATIVE CHOLANGIOGRAM;  Surgeon: Luretha Murphy, MD;  Location: [facility];  Service: General;  Laterality: N/A;   IR GENERIC HISTORICAL  03/13/2016   IR PERC CHOLECYSTOSTOMY 03/13/2016 [name], MD [facility]   IR GENERIC HISTORICAL  04/28/2016   IR RADIOLOGIST EVAL & MGMT 04/28/2016 [name], MD [facility]   LOOP RECORDER IMPLANT N/A 11/29/2013   Procedure: LOOP RECORDER IMPLANT;   Surgeon: Marinus Maw, MD;  Location: [facility] CATH LAB;  Service: Cardiovascular;  Laterality: N/A;   TEE WITHOUT CARDIOVERSION N/A 08/24/2013   Procedure: TRANSESOPHAGEAL ECHOCARDIOGRAM (TEE);  Surgeon: [name], MD;  Location: [facility] ENDOSCOPY;  Service: Cardiovascular;  Laterality: N/A;    FAMILY HISTORY Family History  Problem Relation Age of Onset   Stomach cancer Mother    High blood pressure Mother    Diabetes Mother    Diabetes Maternal Grandfather    High blood pressure Maternal Grandfather    Heart disease Neg Hx     SOCIAL HISTORY Social History   Tobacco Use   Smoking status: Never   Smokeless tobacco: Never  Vaping Use   Vaping Use: Never used  Substance Use Topics   Alcohol use: No    Alcohol/week: 1.0 standard drink    Types: 1 Standard drinks or equivalent per week    Comment: Social   Drug use: No         OPHTHALMIC EXAM:  Base Eye Exam     Visual Acuity (ETDRS)       Right Left   Dist [place] 20/400 20/20 -1   Dist ph [place] NI          Tonometry (Tonopen, 1:58 PM)       Right Left   Pressure 12 13         Pupils       Pupils Dark Light Shape React APD   Right PERRL 6 5 Round Brisk None   Left PERRL 6 5 Round Brisk None         Visual Fields (Counting fingers)       Left Right    Full Full         Extraocular Movement       Right Left    Full, Ortho Full, Ortho         Neuro/Psych     Oriented x3: Yes   Mood/Affect: Normal         Dilation     Right eye: 1.0% Mydriacyl, 2.5% Phenylephrine @ 1:58 PM           Slit Lamp and Fundus Exam     External Exam       Right Left   External Normal Normal         Slit Lamp Exam       Right Left   Lids/Lashes Normal Normal   Conjunctiva/Sclera White and quiet White and quiet   Cornea Clear Clear   Anterior Chamber Deep and quiet Deep and quiet   Iris Round and reactive Round and reactive   Lens Posterior chamber intraocular lens, ,  2+ Posterior capsular  opacification 2+ Nuclear sclerosis, Cortical cataract   Anterior Vitreous Normal Normal         Fundus Exam       Right Left   Posterior Vitreous Posterior vitreous detachment, Central vitreous floaters, Iluvien implant visualized in the inferotemporal quadrant after injection procedure completed    Disc Normal    C/D Ratio 0.5    Macula Focal laser scars,  macular thickening, Microaneurysms, much less, no discernible thickening    Vessels Retinopathy,, severe NPDR    Periphery Normal, good prp, room 360,,, for more laser in the future, posteriorly, and nasally             IMAGING AND PROCEDURES  Imaging and Procedures for 11/25/20  OCT, Retina - OU - Both Eyes       Right Eye Quality was good. Scan locations included subfoveal. Central Foveal Thickness: 289. Findings include abnormal foveal contour.   Left Eye Central Foveal Thickness: 325. Findings include abnormal foveal contour.   Notes Less than 1 week post injection implant, Iluvien OD with rather massive improvement in CME center involved from CSME resistant to all other antivegF medications  OS stable overall             ASSESSMENT/PLAN:  Right posterior capsular opacification OD with some some significant PC opacity now present.  Now that CME from CSME OD are is improved post Iluvien, will recommend YAG capsulotomy OD  Follow-up with Dr. Fabian Sharp     ICD-10-CM   1. Severe nonproliferative diabetic retinopathy of right eye, with macular edema, associated with type 2 diabetes mellitus (HCC)  E11.3411 OCT, Retina - OU - Both Eyes    2. Right posterior capsular opacification  H26.491       1.  OD with improved CME and improved acuity post Iluvien implant.  Goal is to provide some 3 years of intravitreal slow release steroid.  Patient has responded in times past with 2 previous injection of Ozurdex with no sign of secondary glaucoma in the last steroid induced glaucoma will be monitored for over  time.  2.  NPDR OS, continue to monitor and follow-up dilate OU next  3.  Follow-up for cataract evaluation left eye as scheduled  4.  Okay to proceed with YAG capsulotomy in the right eye at any time per Dr. Emogene Morgan  Ophthalmic Meds Ordered this visit:  No orders of the defined types were placed in this encounter.      Return in about 3 months (around 02/25/2021) for DILATE OU, OCT.  There are no Patient Instructions on file for this visit.   Explained the diagnoses, plan, and follow up with the patient and they expressed understanding.  Patient expressed understanding of the importance of proper follow up care.   Alford Highland Mirka Barbone M.D. Diseases & Surgery of the Retina and Vitreous [facility] 11/25/20     Abbreviations: M myopia (nearsighted); A astigmatism; H hyperopia (farsighted); P presbyopia; Mrx spectacle prescription;  CTL contact lenses; OD right eye; OS left eye; OU both eyes  XT exotropia; ET esotropia; PEK punctate epithelial keratitis; PEE punctate epithelial erosions; DES dry eye syndrome; MGD meibomian gland dysfunction; ATs artificial tears; PFAT's preservative free artificial tears; NSC nuclear sclerotic cataract; PSC posterior subcapsular cataract; ERM epi-retinal membrane; PVD posterior vitreous detachment; RD retinal detachment; DM diabetes mellitus; DR diabetic retinopathy; NPDR non-proliferative diabetic retinopathy; PDR proliferative diabetic retinopathy; CSME clinically significant macular edema; DME diabetic macular  edema; dbh dot blot hemorrhages; CWS cotton wool spot; POAG primary open angle glaucoma; C/D cup-to-disc ratio; HVF humphrey visual field; GVF goldmann visual field; OCT optical coherence tomography; IOP intraocular pressure; BRVO Branch retinal vein occlusion; CRVO central retinal vein occlusion; CRAO central retinal artery occlusion; BRAO branch retinal artery occlusion; RT retinal tear; SB scleral buckle; PPV pars plana  vitrectomy; VH Vitreous hemorrhage; PRP panretinal laser photocoagulation; IVK intravitreal kenalog; VMT vitreomacular traction; MH Macular hole;  NVD neovascularization of the disc; NVE neovascularization elsewhere; AREDS age related eye disease study; ARMD age related macular degeneration; POAG primary open angle glaucoma; EBMD epithelial/anterior basement membrane dystrophy; ACIOL anterior chamber intraocular lens; IOL intraocular lens; PCIOL posterior chamber intraocular lens; Phaco/IOL phacoemulsification with intraocular lens placement; PRK photorefractive keratectomy; LASIK laser assisted in situ keratomileusis; HTN hypertension; DM diabetes mellitus; COPD chronic obstructive pulmonary disease

## 2020-11-25 NOTE — Assessment & Plan Note (Signed)
OD with some some significant PC opacity now present.  Now that CME from CSME OD are is improved post Iluvien, will recommend YAG capsulotomy OD  Follow-up with Dr. Fabian Sharp

## 2020-11-27 NOTE — Addendum Note (Signed)
Addended by: Andrez Grime on: 11/27/2020 10:43 AM   Modules accepted: Level of Service

## 2020-11-29 ENCOUNTER — Other Ambulatory Visit: Payer: Self-pay

## 2020-11-29 ENCOUNTER — Ambulatory Visit (INDEPENDENT_AMBULATORY_CARE_PROVIDER_SITE_OTHER): Payer: Medicare Other | Admitting: Family Medicine

## 2020-11-29 ENCOUNTER — Other Ambulatory Visit: Payer: Self-pay | Admitting: Family Medicine

## 2020-11-29 ENCOUNTER — Ambulatory Visit: Payer: Medicare Other | Admitting: Family Medicine

## 2020-11-29 ENCOUNTER — Encounter: Payer: Self-pay | Admitting: Family Medicine

## 2020-11-29 VITALS — BP 138/76 | HR 71 | Temp 97.7°F | Ht 62.0 in | Wt 128.6 lb

## 2020-11-29 DIAGNOSIS — T7840XA Allergy, unspecified, initial encounter: Secondary | ICD-10-CM | POA: Diagnosis not present

## 2020-11-29 DIAGNOSIS — R197 Diarrhea, unspecified: Secondary | ICD-10-CM | POA: Diagnosis not present

## 2020-11-29 DIAGNOSIS — E785 Hyperlipidemia, unspecified: Secondary | ICD-10-CM

## 2020-11-29 DIAGNOSIS — I10 Essential (primary) hypertension: Secondary | ICD-10-CM

## 2020-11-29 LAB — BASIC METABOLIC PANEL
BUN: 12 mg/dL (ref 6–23)
CO2: 26 mEq/L (ref 19–32)
Calcium: 8.6 mg/dL (ref 8.4–10.5)
Chloride: 104 mEq/L (ref 96–112)
Creatinine, Ser: 0.7 mg/dL (ref 0.40–1.20)
GFR: 90.07 mL/min (ref >60.00)
Glucose, Bld: 135 mg/dL — ABNORMAL HIGH (ref 70–99)
Potassium: 3.5 mEq/L (ref 3.5–5.1)
Sodium: 140 mEq/L (ref 135–145)

## 2020-11-29 NOTE — Progress Notes (Signed)
Established Patient Office Visit  Subjective:  Patient ID: Marie [name], female    DOB: 04-11-1955  Age: 65 y.o. MRN: [id number]  CC:  Chief Complaint  Patient presents with   Cough    C/O dry cough and diarrhea x 3 days. Per patient she feels better still has cough.     HPI [name] presents for follow-up of hypertension, cough and loose stool.  Coughing loose stool began 3 days ago after she had slept overnight near a rug that had recently been dry clean.  Cough is nonproductive.  There is no fever or chills.  Denies sneezing postnasal drip sore throat.  There is no wheezing or tightness in the chest.  Denies fevers blood or pus in her stool.  Cough and loose stool are resolving.  Saw cardiology back at the first of the month where she admitted stopping the lisinopril on her own.  Cardiology started her on losartan and advised her to have her kidneys checked in a few weeks.  Past Medical History:  Diagnosis Date   CVA (cerebral vascular accident) (HCC) 2015   Depression    Diabetes (HCC)    t2   Gallstones    Hyperlipemia    Hypertension    Migraines     Past Surgical History:  Procedure Laterality Date   BREAST BIOPSY Right 05/2019   CESAREAN SECTION     x2   CHOLECYSTECTOMY N/A 05/25/2016   Procedure: LAPAROSCOPIC CHOLECYSTECTOMY WITH INTRAOPERATIVE CHOLANGIOGRAM;  Surgeon: Luretha Murphy, MD;  Location: [facility];  Service: General;  Laterality: N/A;   IR GENERIC HISTORICAL  03/13/2016   IR PERC CHOLECYSTOSTOMY 03/13/2016 [name], MD [facility]   IR GENERIC HISTORICAL  04/28/2016   IR RADIOLOGIST EVAL & MGMT 04/28/2016 [name], MD [facility]   LOOP RECORDER IMPLANT N/A 11/29/2013   Procedure: LOOP RECORDER IMPLANT;  Surgeon: Marinus Maw, MD;  Location: [facility] CATH LAB;  Service: Cardiovascular;  Laterality: N/A;   TEE WITHOUT CARDIOVERSION N/A 08/24/2013   Procedure: TRANSESOPHAGEAL ECHOCARDIOGRAM (TEE);  Surgeon: [name], MD;  Location: [facility]  ENDOSCOPY;  Service: Cardiovascular;  Laterality: N/A;    Family History  Problem Relation Age of Onset   Stomach cancer Mother    High blood pressure Mother    Diabetes Mother    Diabetes Maternal Grandfather    High blood pressure Maternal Grandfather    Heart disease Neg Hx     Social History   Socioeconomic History   Marital status: Married    Spouse name: Not on file   Number of children: 3   Years of education: MBA   Highest education level: Not on file  Occupational History    Employer: [employer]    Comment: [employer]  Tobacco Use   Smoking status: Never   Smokeless tobacco: Never  Vaping Use   Vaping Use: Never used  Substance and Sexual Activity   Alcohol use: No    Alcohol/week: 1.0 standard drink    Types: 1 Standard drinks or equivalent per week    Comment: Social   Drug use: No   Sexual activity: Not on file  Other Topics Concern   Not on file  Social History Narrative   Patient lives at home with [name].   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         Social  Determinants of Health   Financial Resource Strain: Not on file  Food Insecurity: Not on file  Transportation Needs: Not on file  Physical Activity: Not on file  Stress: Not on file  Social Connections: Not on file  Intimate Partner Violence: Not on file    Outpatient Medications Prior to Visit  Medication Sig Dispense Refill   aspirin EC 81 MG tablet Take 81 mg by mouth at bedtime.     atorvastatin (LIPITOR) 80 MG tablet Take 1 tablet (80 mg total) by mouth daily. 90 tablet 3   Cholecalciferol (VITAMIN D3) 3000 units TABS Take 3,000 Units by mouth at bedtime.     Continuous Blood Gluc Sensor (FREESTYLE LIBRE 2 SENSOR) MISC 2 Devices by Does not apply route every 14 (fourteen) days. 6 each 3   dapagliflozin propanediol (FARXIGA) 10 MG TABS tablet Take 1 tablet (10 mg total) by mouth daily. 90  tablet 3   glucose blood test strip 1 each by Other route as needed for other. Use as instructed     metFORMIN (GLUCOPHAGE-XR) 500 MG 24 hr tablet Take 2 tablets (1,000 mg total) by mouth in the morning and at bedtime. 360 tablet 3   Multiple Vitamin (MULTIVITAMIN WITH MINERALS) TABS tablet Take 1 tablet by mouth daily.     repaglinide (PRANDIN) 1 MG tablet 2 pills in am and 1 in pm before meals 180 tablet 2   valsartan (DIOVAN) 80 MG tablet Take 1 tablet (80 mg total) by mouth daily. 90 tablet 3   Ginkgo Biloba 500 MG CAPS Take 500 mg by mouth at bedtime.     No facility-administered medications prior to visit.    Allergies  Allergen Reactions   Penicillins Other (See Comments)    Driving couldn't see and head felt heavy  Has patient had a PCN reaction causing immediate rash, facial/tongue/throat swelling, SOB or lightheadedness with hypotension: No Has patient had a PCN reaction causing severe rash involving mucus membranes or skin necrosis: No Has patient had a PCN reaction that required hospitalization: No Has patient had a PCN reaction occurring within the last 10 years: No If all of the above answers are "NO", then may proceed with Cephalosporin use.     ROS Review of Systems  Constitutional:  Negative for chills, diaphoresis, fatigue, fever and unexpected weight change.  HENT:  Negative for congestion, postnasal drip, sneezing and sore throat.   Eyes:  Positive for visual disturbance. Negative for photophobia.  Respiratory:  Positive for cough. Negative for chest tightness, shortness of breath and wheezing.   Cardiovascular: Negative.   Gastrointestinal:  Negative for abdominal pain, anal bleeding and blood in stool.  Genitourinary: Negative.   Musculoskeletal:  Negative for arthralgias and myalgias.     Objective:    Physical Exam Vitals and nursing note reviewed.  Constitutional:      General: She is not in acute distress.    Appearance: Normal appearance. She is not  ill-appearing, toxic-appearing or diaphoretic.  HENT:     Head: Normocephalic and atraumatic.     Right Ear: External ear normal.     Left Ear: External ear normal.     Mouth/Throat:     Mouth: Mucous membranes are moist.     Pharynx: Oropharynx is clear. No oropharyngeal exudate or posterior oropharyngeal erythema.  Eyes:     General: No scleral icterus.       Right eye: No discharge.        Left eye: No discharge.  Conjunctiva/sclera: Conjunctivae normal.  Neck:     Vascular: No carotid bruit.  Cardiovascular:     Rate and Rhythm: Normal rate and regular rhythm.  Pulmonary:     Effort: Pulmonary effort is normal. No respiratory distress.     Breath sounds: Normal breath sounds. No stridor. No wheezing, rhonchi or rales.  Abdominal:     General: Bowel sounds are normal.  Musculoskeletal:     Cervical back: No rigidity or tenderness.  Lymphadenopathy:     Cervical: No cervical adenopathy.  Skin:    General: Skin is warm and dry.  Neurological:     Mental Status: She is alert and oriented to person, place, and time.  Psychiatric:        Mood and Affect: Mood normal.        Behavior: Behavior normal.    BP 138/76 (BP Location: Left Arm, Patient Position: Sitting, Cuff Size: Normal)   Pulse 71   Temp 97.7 F (36.5 C) (Temporal)   Ht 5\' 2"  (1.575 m)   Wt 128 lb 9.6 oz (58.3 kg)   SpO2 98%   BMI 23.52 kg/m  Wt Readings from Last 3 Encounters:  11/29/20 128 lb 9.6 oz (58.3 kg)  11/04/20 131 lb 12.8 oz (59.8 kg)  10/24/20 131 lb 3.2 oz (59.5 kg)     Health Maintenance Due  Topic Date Due   DEXA SCAN  Never done   INFLUENZA VACCINE  11/04/2020    There are no preventive care reminders to display for this patient.  Lab Results  Component Value Date   TSH 2.42 10/13/2018   Lab Results  Component Value Date   WBC 5.7 09/24/2020   HGB 13.8 09/24/2020   HCT 41.4 09/24/2020   MCV 85.6 09/24/2020   PLT 211.0 09/24/2020   Lab Results  Component Value Date    NA 142 10/25/2020   K 4.6 10/25/2020   CO2 28 10/25/2020   GLUCOSE 88 10/25/2020   BUN 15 10/25/2020   CREATININE 0.73 10/25/2020   BILITOT 0.5 09/24/2020   ALKPHOS 70 09/24/2020   AST 17 09/24/2020   ALT 24 09/24/2020   PROT 7.1 09/24/2020   ALBUMIN 4.4 09/24/2020   CALCIUM 9.1 10/25/2020   ANIONGAP 11 05/21/2016   GFR 85.70 10/25/2020   Lab Results  Component Value Date   CHOL 136 10/25/2020   Lab Results  Component Value Date   HDL 49.20 10/25/2020   Lab Results  Component Value Date   LDLCALC 68 10/25/2020   Lab Results  Component Value Date   TRIG 94.0 10/25/2020   Lab Results  Component Value Date   CHOLHDL 3 10/25/2020   Lab Results  Component Value Date   HGBA1C 7.4 (H) 10/25/2020      Assessment & Plan:   Problem List Items Addressed This Visit       Cardiovascular and Mediastinum   Essential hypertension - Primary   Relevant Orders   Basic metabolic panel     Other   Allergic reaction   Diarrhea    No orders of the defined types were placed in this encounter.   Follow-up: Return in about 3 months (around 03/01/2021), or if symptoms worsen or fail to improve.   Discussed the importance of taking an ACE or an ARB for control of her blood pressure and heart kidney protection.  Rechecking renal function today.  Continue Diovan.  Blood pressure much improved today.  Cough and loose stool are resolving.  Follow-up if these do not continue to improve. Mliss Sax, MD

## 2020-12-04 ENCOUNTER — Telehealth: Payer: Self-pay | Admitting: Endocrinology

## 2020-12-04 NOTE — Telephone Encounter (Signed)
Tequila with [facility] requests to be called at ph# [phone]

## 2020-12-05 ENCOUNTER — Other Ambulatory Visit: Payer: Self-pay | Admitting: Endocrinology

## 2020-12-05 DIAGNOSIS — E1165 Type 2 diabetes mellitus with hyperglycemia: Secondary | ICD-10-CM

## 2020-12-05 NOTE — Telephone Encounter (Signed)
Labs have been ordered.  However she has an appointment with her PCP later than her appointment here

## 2020-12-11 ENCOUNTER — Telehealth: Payer: Self-pay | Admitting: Family Medicine

## 2020-12-11 DIAGNOSIS — Z0000 Encounter for general adult medical examination without abnormal findings: Secondary | ICD-10-CM

## 2020-12-11 NOTE — Telephone Encounter (Signed)
Patient calling with questions about cologuard checking to see if she should have another kit sent to her home for testing since she is at the 3 year mark. Patient would also like to know if she could have an order for bone density. Please advise.

## 2020-12-11 NOTE — Telephone Encounter (Signed)
Pt requested a call back from Tequila about cologaurd, she has some questions

## 2020-12-12 ENCOUNTER — Encounter: Payer: Self-pay | Admitting: Family Medicine

## 2020-12-13 ENCOUNTER — Other Ambulatory Visit: Payer: Self-pay

## 2020-12-13 ENCOUNTER — Other Ambulatory Visit (INDEPENDENT_AMBULATORY_CARE_PROVIDER_SITE_OTHER): Payer: Medicare Other

## 2020-12-13 ENCOUNTER — Other Ambulatory Visit: Payer: Medicare Other

## 2020-12-13 DIAGNOSIS — E1165 Type 2 diabetes mellitus with hyperglycemia: Secondary | ICD-10-CM | POA: Diagnosis not present

## 2020-12-13 LAB — BASIC METABOLIC PANEL
BUN: 17 mg/dL (ref 6–23)
CO2: 29 mEq/L (ref 19–32)
Calcium: 9.4 mg/dL (ref 8.4–10.5)
Chloride: 105 mEq/L (ref 96–112)
Creatinine, Ser: 0.84 mg/dL (ref 0.40–1.20)
GFR: 72.35 mL/min (ref >60.00)
Glucose, Bld: 170 mg/dL — ABNORMAL HIGH (ref 70–99)
Potassium: 4.9 mEq/L (ref 3.5–5.1)
Sodium: 142 mEq/L (ref 135–145)

## 2020-12-13 LAB — MICROALBUMIN / CREATININE URINE RATIO
Creatinine,U: 68.7 mg/dL
Microalb Creat Ratio: 1.2 mg/g (ref 0.0–30.0)
Microalb, Ur: 0.8 mg/dL (ref 0.0–1.9)

## 2020-12-13 LAB — HEMOGLOBIN A1C: Hgb A1c MFr Bld: 7.6 % — ABNORMAL HIGH (ref 4.6–6.5)

## 2020-12-13 NOTE — Telephone Encounter (Signed)
Patient aware that orders were put in.

## 2020-12-16 ENCOUNTER — Encounter: Payer: Self-pay | Admitting: Endocrinology

## 2020-12-16 ENCOUNTER — Ambulatory Visit (INDEPENDENT_AMBULATORY_CARE_PROVIDER_SITE_OTHER): Payer: Medicare Other | Admitting: Endocrinology

## 2020-12-16 ENCOUNTER — Other Ambulatory Visit: Payer: Self-pay

## 2020-12-16 VITALS — BP 138/82 | HR 81 | Ht 61.0 in | Wt 132.0 lb

## 2020-12-16 DIAGNOSIS — E1165 Type 2 diabetes mellitus with hyperglycemia: Secondary | ICD-10-CM

## 2020-12-16 MED ORDER — REPAGLINIDE 2 MG PO TABS: ORAL_TABLET | ORAL | 1 refills | Status: DC

## 2020-12-16 NOTE — Progress Notes (Signed)
Patient ID: Marie [name], female   DOB: 20-Dec-1955, 65 y.o.   MRN: [id number]           Reason for Appointment: for Type 2 Diabetes   History of Present Illness:          Date of diagnosis of type 2 diabetes mellitus: 2010       Background history:   She was likely treated with Metformin alone for the first few years and subsequently given Januvia In 2018 she was also given [place] in addition Current records indicate her highest A1c was 9.6 in 03/2017.  Has been progressively improving since then She was also started on Rybelsus in 02/2019  Recent history:   A1c is 7.6, previously was 7.4 Last fructosamine 260  Non-insulin hypoglycemic drugs the patient is taking are: Metformin 1 g twice daily, Farxiga 10 mg daily, Prandin 2.0 mg before breakfast and 1 mg before dinner   Current management, blood sugar patterns and problems identified  Her blood sugars have been more difficult with her stopping Rybelsus which she could not afford  She is taking her Prandin before breakfast and dinner directed  She is taking multiple doses of metformin she is taking her Farxiga at dinnertime instead of in the morning However blood sugars overall are not improving  She has higher postprandial readings especially after breakfast  Today she had 2 slices of bread along with a banana and another carbohydrate snack at breakfast  With this her blood sugar was over 300  However blood sugars rise after breakfast is not consistently as high  Overall postprandial data is incomplete in the evenings after dinner  Sometimes will have a half sandwich at lunchtime without any findings but usually not having much increase in blood sugar at that time  With the freestyle libre her average blood sugar is 165 with previous 151 and also less time in range  She is trying to do periodic walking and using her home equipment         Side effects from medications have been: Decreased appetite from Rybelsus   Typical  meal intake: Breakfast is egg, toast .              Exercise:  Exercise equipment, ? elliptical, using for 15 min daily   Glucose monitoring:  Using freestyle libre   Data for the last 2 weeks  CGM use % of time 62  2-week average/GV 165/26  Time in range    67    %  % Time Above 180 29+4  % Time above 250   % Time Below 70 0     PRE-MEAL Fasting Lunch Dinner Bedtime Overall  Glucose range:       Averages: 147    165   POST-MEAL PC Breakfast PC Lunch PC Dinner  Glucose range:     Averages: 193 186 177   Previously:   CGM use % of time 61  2-week average/GV 151  Time in range       78%  % Time Above 180 22  % Time above 250   % Time Below 70 0     PRE-MEAL Fasting Lunch Dinner Bedtime Overall  Glucose range:       Averages: 124  150  151   POST-MEAL PC Breakfast PC Lunch PC Dinner  Glucose range:     Averages: 198 153 162     Dietician visit, most recent: 11/14  Weight history:   Wt Readings from  Last 3 Encounters:  12/16/20 132 lb (59.9 kg)  11/29/20 128 lb 9.6 oz (58.3 kg)  11/04/20 131 lb 12.8 oz (59.8 kg)    Glycemic control:   Lab Results  Component Value Date   HGBA1C 7.6 (H) 12/13/2020   HGBA1C 7.4 (H) 10/25/2020   HGBA1C 7.6 (A) 09/26/2020   Lab Results  Component Value Date   MICROALBUR 0.8 12/13/2020   LDLCALC 68 10/25/2020   CREATININE 0.84 12/13/2020   Lab Results  Component Value Date   MICRALBCREAT 1.2 12/13/2020    Lab Results  Component Value Date   FRUCTOSAMINE 260 10/25/2020    Lab on 12/13/2020  Component Date Value Ref Range Status   Microalb, Ur 12/13/2020 0.8  0.0 - 1.9 mg/dL Final   Creatinine,U 37/16/9678 68.7  mg/dL Final   Microalb Creat Ratio 12/13/2020 1.2  0.0 - 30.0 mg/g Final   Sodium 12/13/2020 142  135 - 145 mEq/L Final   Potassium 12/13/2020 4.9  3.5 - 5.1 mEq/L Final   Chloride 12/13/2020 105  96 - 112 mEq/L Final   CO2 12/13/2020 29  19 - 32 mEq/L Final   Glucose, Bld 12/13/2020 170 (A) 70 - 99  mg/dL Final   BUN 93/81/0175 17  6 - 23 mg/dL Final   Creatinine, Ser 12/13/2020 0.84  0.40 - 1.20 mg/dL Final   GFR 01/28/8526 72.35  >60.00 mL/min Final   Calculated using the CKD-EPI Creatinine Equation (2021)   Calcium 12/13/2020 9.4  8.4 - 10.5 mg/dL Final   Hgb P8E MFr Bld 12/13/2020 7.6 (A) 4.6 - 6.5 % Final   Glycemic Control Guidelines for People with Diabetes:Non Diabetic:  <6%Goal of Therapy: <7%Additional Action Suggested:  >8%     Allergies as of 12/16/2020       Reactions   Penicillins Other (See Comments)   Driving couldn't see and head felt heavy  Has patient had a PCN reaction causing immediate rash, facial/tongue/throat swelling, SOB or lightheadedness with hypotension: No Has patient had a PCN reaction causing severe rash involving mucus membranes or skin necrosis: No Has patient had a PCN reaction that required hospitalization: No Has patient had a PCN reaction occurring within the last 10 years: No If all of the above answers are "NO", then may proceed with Cephalosporin use.        Medication List        Accurate as of December 16, 2020 11:59 PM. If you have any questions, ask your nurse or doctor.          aspirin EC 81 MG tablet Take 81 mg by mouth at bedtime.   atorvastatin 80 MG tablet Commonly known as: LIPITOR Take 1 tablet (80 mg total) by mouth daily.   dapagliflozin propanediol 10 MG Tabs tablet Commonly known as: Farxiga Take 1 tablet (10 mg total) by mouth daily.   FreeStyle Libre 2 Sensor Misc 2 Devices by Does not apply route every 14 (fourteen) days.   glucose blood test strip 1 each by Other route as needed for other. Use as instructed   metFORMIN 500 MG 24 hr tablet Commonly known as: GLUCOPHAGE-XR Take 2 tablets (1,000 mg total) by mouth in the morning and at bedtime.   multivitamin with minerals Tabs tablet Take 1 tablet by mouth daily.   repaglinide 2 MG tablet Commonly known as: Prandin 2 before breakfast 1 at lunch  and 1 before dinner What changed:  medication strength additional instructions Changed by: Reather Littler, MD  valsartan 80 MG tablet Commonly known as: Diovan Take 1 tablet (80 mg total) by mouth daily.   Vitamin D3 75 MCG (3000 UT) Tabs Take 3,000 Units by mouth at bedtime.        Allergies:  Allergies  Allergen Reactions   Penicillins Other (See Comments)    Driving couldn't see and head felt heavy  Has patient had a PCN reaction causing immediate rash, facial/tongue/throat swelling, SOB or lightheadedness with hypotension: No Has patient had a PCN reaction causing severe rash involving mucus membranes or skin necrosis: No Has patient had a PCN reaction that required hospitalization: No Has patient had a PCN reaction occurring within the last 10 years: No If all of the above answers are "NO", then may proceed with Cephalosporin use.     Past Medical History:  Diagnosis Date   CVA (cerebral vascular accident) (HCC) 2015   Depression    Diabetes (HCC)    t2   Gallstones    Hyperlipemia    Hypertension    Migraines     Past Surgical History:  Procedure Laterality Date   BREAST BIOPSY Right 05/2019   CESAREAN SECTION     x2   CHOLECYSTECTOMY N/A 05/25/2016   Procedure: LAPAROSCOPIC CHOLECYSTECTOMY WITH INTRAOPERATIVE CHOLANGIOGRAM;  Surgeon: Luretha Murphy, MD;  Location: [facility];  Service: General;  Laterality: N/A;   IR GENERIC HISTORICAL  03/13/2016   IR PERC CHOLECYSTOSTOMY 03/13/2016 [name], MD [facility]   IR GENERIC HISTORICAL  04/28/2016   IR RADIOLOGIST EVAL & MGMT 04/28/2016 [name], MD [facility] RAD   LOOP RECORDER IMPLANT N/A 11/29/2013   Procedure: LOOP RECORDER IMPLANT;  Surgeon: Marinus Maw, MD;  Location: [facility] CATH LAB;  Service: Cardiovascular;  Laterality: N/A;   TEE WITHOUT CARDIOVERSION N/A 08/24/2013   Procedure: TRANSESOPHAGEAL ECHOCARDIOGRAM (TEE);  Surgeon: [name], MD;  Location: [facility] ENDOSCOPY;  Service: Cardiovascular;   Laterality: N/A;    Family History  Problem Relation Age of Onset   Stomach cancer Mother    High blood pressure Mother    Diabetes Mother    Diabetes Maternal Grandfather    High blood pressure Maternal Grandfather    Heart disease Neg Hx     Social History:  reports that she has never smoked. She has never used smokeless tobacco. She reports that she does not drink alcohol and does not use drugs.   Review of Systems    Lipid history: LDL controlled with atorvastatin 40 mg. She says that she is taking her atorvastatin regularly but her prescription bottle is dated last year in July She was also supposed to change to 80 mg atorvastatin and she is not aware of this, prescription apparently was sent by PCP yesterday    Lab Results  Component Value Date   CHOL 136 10/25/2020   HDL 49.20 10/25/2020   LDLCALC 68 10/25/2020   LDLDIRECT 136.0 09/24/2020   TRIG 94.0 10/25/2020   CHOLHDL 3 10/25/2020           Hypertension: Has been present and treated with lisinopril 40 mg and amlodipine 5 mg Followed by PCP  BP Readings from Last 3 Encounters:  12/16/20 138/82  11/29/20 138/76  11/04/20 (!) 152/82    Most recent eye exam was in 11/21.  Has retinopathy  Most recent foot exam: 11/21  Currently known complications of diabetes: Retinopathy with decreased vision on the right, regularly followed by retinal surgery  Normal microalbumin in 7/21  History of CVA with  left facial numbness and speech difficulty  LABS:  Lab on 12/13/2020  Component Date Value Ref Range Status   Microalb, Ur 12/13/2020 0.8  0.0 - 1.9 mg/dL Final   Creatinine,U 16/01/9603 68.7  mg/dL Final   Microalb Creat Ratio 12/13/2020 1.2  0.0 - 30.0 mg/g Final   Sodium 12/13/2020 142  135 - 145 mEq/L Final   Potassium 12/13/2020 4.9  3.5 - 5.1 mEq/L Final   Chloride 12/13/2020 105  96 - 112 mEq/L Final   CO2 12/13/2020 29  19 - 32 mEq/L Final   Glucose, Bld 12/13/2020 170 (A) 70 - 99 mg/dL Final   BUN  54/12/8117 17  6 - 23 mg/dL Final   Creatinine, Ser 12/13/2020 0.84  0.40 - 1.20 mg/dL Final   GFR 14/78/2956 72.35  >60.00 mL/min Final   Calculated using the CKD-EPI Creatinine Equation (2021)   Calcium 12/13/2020 9.4  8.4 - 10.5 mg/dL Final   Hgb O1H MFr Bld 12/13/2020 7.6 (A) 4.6 - 6.5 % Final   Glycemic Control Guidelines for People with Diabetes:Non Diabetic:  <6%Goal of Therapy: <7%Additional Action Suggested:  >8%     Physical Examination:  BP 138/82   Pulse 81   Ht 5\' 1"  (1.549 m)   Wt 132 lb (59.9 kg)   SpO2 99%   BMI 24.94 kg/m      Exam not indicated     ASSESSMENT:  Diabetes type 2 nonobese  See history of present illness for detailed discussion of current diabetes management, blood sugar patterns and problems identified  A1c is 7.6, last 7.2  Current treatment regimen is Metformin, Farxiga 10 mg and Prandin  She is still having high postprandial readings although most of these are related to high carbohydrate intake Previously had done better with Rybelsus and A1c is still inadequately controlled Her time in target on her CGM is 67% although data after dinner is inconsistent    PLAN:    She will need to significantly cut back on carbohydrates especially at breakfast and increase protein  She will be seen by the dietitian for meal planning  Given examples of how to add more protein  If she is eating a half sandwich at lunch she will take 1 tablet of Prandin at that time also However if she is eating a light meal in the morning she can reduce the Prandin to 1 tablet Consider trying Rybelsus again when she is over the donut hole Continue metformin unchanged  Follow-up in 2 months    Patient Instructions  Farxiga before Breakfast  Protein at each meal        12/17/2020, 8:57 AM   Note: This office note was prepared with Dragon voice recognition system technology. Any transcriptional errors that result from this process are  unintentional.

## 2020-12-16 NOTE — Patient Instructions (Signed)
Marcelline Deist before Breakfast  Protein at each meal

## 2020-12-18 ENCOUNTER — Ambulatory Visit (HOSPITAL_BASED_OUTPATIENT_CLINIC_OR_DEPARTMENT_OTHER): Payer: Medicare Other | Admitting: Cardiology

## 2020-12-19 NOTE — Addendum Note (Signed)
Addended by: Nadene Rubins A on: 12/19/2020 12:55 PM   Modules accepted: Level of Service

## 2020-12-23 ENCOUNTER — Telehealth: Payer: Self-pay | Admitting: Family Medicine

## 2020-12-23 NOTE — Telephone Encounter (Signed)
Pt is wondering if she is needing any shots. She has an upcoming appointment with Dr. Doreene Burke in October and is going to request a flu shot with that visit. Please advise pt

## 2020-12-25 ENCOUNTER — Other Ambulatory Visit: Payer: Self-pay

## 2020-12-25 ENCOUNTER — Other Ambulatory Visit (HOSPITAL_COMMUNITY)
Admission: RE | Admit: 2020-12-25 | Discharge: 2020-12-25 | Disposition: A | Discharge status: Home / Self Care | Payer: Medicare Other | Source: Ambulatory Visit | Attending: Obstetrics & Gynecology | Admitting: Obstetrics & Gynecology

## 2020-12-25 ENCOUNTER — Ambulatory Visit (INDEPENDENT_AMBULATORY_CARE_PROVIDER_SITE_OTHER): Payer: Medicare Other | Admitting: Obstetrics & Gynecology

## 2020-12-25 ENCOUNTER — Encounter: Payer: Self-pay | Admitting: Family Medicine

## 2020-12-25 ENCOUNTER — Encounter: Payer: Self-pay | Admitting: Obstetrics & Gynecology

## 2020-12-25 VITALS — BP 153/76 | HR 80 | Ht 62.0 in | Wt 131.0 lb

## 2020-12-25 DIAGNOSIS — Z01419 Encounter for gynecological examination (general) (routine) without abnormal findings: Secondary | ICD-10-CM | POA: Insufficient documentation

## 2020-12-25 DIAGNOSIS — L292 Pruritus vulvae: Secondary | ICD-10-CM

## 2020-12-25 DIAGNOSIS — Z1231 Encounter for screening mammogram for malignant neoplasm of breast: Secondary | ICD-10-CM

## 2020-12-25 DIAGNOSIS — Z1211 Encounter for screening for malignant neoplasm of colon: Secondary | ICD-10-CM | POA: Diagnosis not present

## 2020-12-25 DIAGNOSIS — Z1212 Encounter for screening for malignant neoplasm of rectum: Secondary | ICD-10-CM | POA: Diagnosis not present

## 2020-12-25 LAB — COLOGUARD

## 2020-12-25 MED ORDER — TERCONAZOLE 0.4 % VA CREA: 1.0000 | TOPICAL_CREAM | Freq: Every day | VAGINAL | 0 refills | Status: DC

## 2020-12-25 NOTE — Progress Notes (Signed)
Subjective:     Marie [name] is a 65 y.o. female here for a routine exam.  Has a son with Downs. 2 other sons. She has DM that is intermittedly controlled. She has had vulvar itching for 3 days.  Has DM and reports intermittent control.    Gynecologic History No LMP recorded. Patient is postmenopausal. Contraception: post menopausal status Last Pap: 02/15/2019. Results were: normal Last mammogram: 02/15/2020. Results were: abnormal. Birad 3  Obstetric History OB History  Gravida Para Term Preterm AB Living  4 4 4    0 4  SAB IAB Ectopic Multiple Live Births  0 0 0 0 4    # Outcome Date GA Lbr Len/2nd Weight Sex Delivery Anes PTL Lv  4 Term           3 Term           2 Term           1 Term            The following portions of the patient's history were reviewed and updated as appropriate: allergies, current medications, past family history, past medical history, past social history, past surgical history, and problem list.  Review of Systems Pertinent items are noted in HPI.    Objective:  BP (!) 153/76   Pulse 80   Ht 5\' 2"  (1.575 m)   Wt 131 lb (59.4 kg)   BMI 23.96 kg/m   General Appearance:    Alert, cooperative, no distress, appears stated age  Head:    Normocephalic, without obvious abnormality, atraumatic  Eyes:    conjunctiva/corneas clear, EOM's intact, both eyes  Ears:    Normal external ear canals, both ears  Nose:   Nares normal, septum midline, mucosa normal, no drainage    or sinus tenderness  Throat:   Lips, mucosa, and tongue normal; teeth and gums normal  Neck:   Supple, symmetrical, trachea midline, no adenopathy;    thyroid:  no enlargement/tenderness/nodules  Back:     Symmetric, no curvature, ROM normal, no CVA tenderness  Lungs:     respirations unlabored  Chest Wall:    No tenderness or deformity   Heart:    Regular rate and rhythm  Breast Exam:    No tenderness, masses, or nipple abnormality  Abdomen:     Soft, non-tender, bowel sounds active all  four quadrants,    no masses, no organomegaly  Genitalia:    Normal female without lesion, discharge or tenderness   There is erythema of the vulva c/w yeast.   Extremities:   Extremities normal, atraumatic, no cyanosis or edema  Pulses:   2+ and symmetric all extremities  Skin:   Skin color, texture, turgor normal, no rashes or lesions     Assessment:    Healthy female exam.  Yeast vulvovaginitis    Plan:  Diagnoses and all orders for this visit:  Well female exam with routine gynecological exam -     Cervicovaginal ancillary only( [place])  Vulvar itching -     terconazole (TERAZOL 7) 0.4 % vaginal cream; Place 1 applicator vaginally at bedtime. Use for seven days  Breast cancer screening by mammogram -     Cancel: MM DIGITAL SCREENING BILATERAL; Future -     Cancel: MM DIAG BREAST TOMO BILATERAL; Future   F/u in 1 year or sooner prn   Jenesa Foresta L. Harraway-Smith, M.D., 

## 2020-12-25 NOTE — Telephone Encounter (Signed)
Called patient to inform that she will need her flu vaccine when she comes in for her OV with Dr.Kremer and all other vaccines are UTD. No answer LM on VM

## 2020-12-27 ENCOUNTER — Other Ambulatory Visit: Payer: Self-pay | Admitting: Obstetrics & Gynecology

## 2020-12-27 DIAGNOSIS — N6489 Other specified disorders of breast: Secondary | ICD-10-CM

## 2020-12-27 LAB — CERVICOVAGINAL ANCILLARY ONLY
Bacterial Vaginitis (gardnerella): NEGATIVE
Candida Glabrata: NEGATIVE
Candida Vaginitis: POSITIVE — AB
Comment: NEGATIVE
Comment: NEGATIVE
Comment: NEGATIVE

## 2020-12-30 ENCOUNTER — Telehealth: Payer: Self-pay

## 2020-12-30 LAB — COLOGUARD: COLOGUARD: NEGATIVE

## 2020-12-30 NOTE — Telephone Encounter (Signed)
Called pt to inform her of positive yeast results. Pt's medication was sent to the pharmacy. Understanding was voiced. Medard Decuir l Abbagayle Zaragoza, CMA

## 2020-12-31 ENCOUNTER — Telehealth: Payer: Self-pay | Admitting: Family Medicine

## 2021-01-01 NOTE — Telephone Encounter (Signed)
Called and lvm for patient to return phone call for lab results. Sw, cma

## 2021-01-02 ENCOUNTER — Ambulatory Visit (INDEPENDENT_AMBULATORY_CARE_PROVIDER_SITE_OTHER): Payer: Medicare Other | Admitting: Ophthalmology

## 2021-01-02 ENCOUNTER — Other Ambulatory Visit: Payer: Self-pay

## 2021-01-02 ENCOUNTER — Encounter: Payer: Self-pay | Admitting: Obstetrics & Gynecology

## 2021-01-02 ENCOUNTER — Encounter (INDEPENDENT_AMBULATORY_CARE_PROVIDER_SITE_OTHER): Payer: Medicare Other | Admitting: Ophthalmology

## 2021-01-02 DIAGNOSIS — E113411 Type 2 diabetes mellitus with severe nonproliferative diabetic retinopathy with macular edema, right eye: Secondary | ICD-10-CM | POA: Diagnosis not present

## 2021-01-02 DIAGNOSIS — E113492 Type 2 diabetes mellitus with severe nonproliferative diabetic retinopathy without macular edema, left eye: Secondary | ICD-10-CM

## 2021-01-02 NOTE — Progress Notes (Signed)
01/02/2021     CHIEF COMPLAINT Patient presents for  Chief Complaint  Patient presents with   Blurred Vision      HISTORY OF PRESENT ILLNESS: Marie [name] is a 65 y.o. female who presents to the clinic today for:   HPI   Blurred vision defined as by the patient looking down upon NVI tearing up in the right significantly. Last edited by Edmon Crape, MD on 01/02/2021  3:44 PM.      Referring physician: Mliss Sax, MD [id number] [place],  [place] [address]  HISTORICAL INFORMATION:   Selected notes from the MEDICAL RECORD NUMBER    Lab Results  Component Value Date   HGBA1C 7.6 (H) 12/13/2020     CURRENT MEDICATIONS: No current outpatient medications on file. (Ophthalmic Drugs)   No current facility-administered medications for this visit. (Ophthalmic Drugs)   Current Outpatient Medications (Other)  Medication Sig   aspirin EC 81 MG tablet Take 81 mg by mouth at bedtime.   atorvastatin (LIPITOR) 80 MG tablet Take 1 tablet (80 mg total) by mouth daily.   Cholecalciferol (VITAMIN D3) 3000 units TABS Take 3,000 Units by mouth at bedtime.   Continuous Blood Gluc Sensor (FREESTYLE LIBRE 2 SENSOR) MISC 2 Devices by Does not apply route every 14 (fourteen) days.   dapagliflozin propanediol (FARXIGA) 10 MG TABS tablet Take 1 tablet (10 mg total) by mouth daily.   glucose blood test strip 1 each by Other route as needed for other. Use as instructed   metFORMIN (GLUCOPHAGE-XR) 500 MG 24 hr tablet Take 2 tablets (1,000 mg total) by mouth in the morning and at bedtime.   Multiple Vitamin (MULTIVITAMIN WITH MINERALS) TABS tablet Take 1 tablet by mouth daily.   repaglinide (PRANDIN) 2 MG tablet 2 before breakfast 1 at lunch and 1 before dinner   terconazole (TERAZOL 7) 0.4 % vaginal cream Place 1 applicator vaginally at bedtime. Use for seven days   valsartan (DIOVAN) 80 MG tablet Take 1 tablet (80 mg total) by mouth daily.   No current facility-administered  medications for this visit. (Other)      REVIEW OF SYSTEMS:    ALLERGIES Allergies  Allergen Reactions   Penicillins Other (See Comments)    Driving couldn't see and head felt heavy  Has patient had a PCN reaction causing immediate rash, facial/tongue/throat swelling, SOB or lightheadedness with hypotension: No Has patient had a PCN reaction causing severe rash involving mucus membranes or skin necrosis: No Has patient had a PCN reaction that required hospitalization: No Has patient had a PCN reaction occurring within the last 10 years: No If all of the above answers are "NO", then may proceed with Cephalosporin use.     PAST MEDICAL HISTORY Past Medical History:  Diagnosis Date   CVA (cerebral vascular accident) (HCC) 2015   Depression    Diabetes (HCC)    t2   Gallstones    Hyperlipemia    Hypertension    Migraines    Past Surgical History:  Procedure Laterality Date   BREAST BIOPSY Right 05/2019   CESAREAN SECTION     x2   CHOLECYSTECTOMY N/A 05/25/2016   Procedure: LAPAROSCOPIC CHOLECYSTECTOMY WITH INTRAOPERATIVE CHOLANGIOGRAM;  Surgeon: Luretha Murphy, MD;  Location: [facility];  Service: General;  Laterality: N/A;   IR GENERIC HISTORICAL  03/13/2016   IR PERC CHOLECYSTOSTOMY 03/13/2016 [name], MD [facility]   IR GENERIC HISTORICAL  04/28/2016   IR RADIOLOGIST EVAL & MGMT  04/28/2016 [name], MD [facility]   LOOP RECORDER IMPLANT N/A 11/29/2013   Procedure: LOOP RECORDER IMPLANT;  Surgeon: Marinus Maw, MD;  Location: [facility] CATH LAB;  Service: Cardiovascular;  Laterality: N/A;   TEE WITHOUT CARDIOVERSION N/A 08/24/2013   Procedure: TRANSESOPHAGEAL ECHOCARDIOGRAM (TEE);  Surgeon: [name], MD;  Location: [facility] ENDOSCOPY;  Service: Cardiovascular;  Laterality: N/A;    FAMILY HISTORY Family History  Problem Relation Age of Onset   Stomach cancer Mother    High blood pressure Mother    Diabetes Mother    Diabetes Maternal Grandfather    High  blood pressure Maternal Grandfather    Heart disease Neg Hx     SOCIAL HISTORY Social History   Tobacco Use   Smoking status: Never   Smokeless tobacco: Never  Vaping Use   Vaping Use: Never used  Substance Use Topics   Alcohol use: No    Alcohol/week: 1.0 standard drink    Types: 1 Standard drinks or equivalent per week    Comment: Social   Drug use: No         OPHTHALMIC EXAM:  Base Eye Exam     Visual Acuity (ETDRS)       Right Left   Dist [place] 20/150 20/20 -1         Tonometry (Tonopen, 2:57 PM)       Right Left   Pressure 20 12         Neuro/Psych     Oriented x3: Yes   Mood/Affect: Normal           Slit Lamp and Fundus Exam     External Exam       Right Left   External Normal Normal         Slit Lamp Exam       Right Left   Lids/Lashes Normal Normal   Conjunctiva/Sclera White and quiet White and quiet   Cornea Clear Clear   Anterior Chamber Deep and quiet Deep and quiet   Iris Round and reactive Round and reactive   Lens Posterior chamber intraocular lens, , 2+ Posterior capsular opacification 2+ Nuclear sclerosis, Cortical cataract   Anterior Vitreous Normal Normal         Fundus Exam       Right Left   Posterior Vitreous Posterior vitreous detachment, Central vitreous floaters, Iluvien implant visualized in the inferotemporal quadrant after injection procedure completed    Disc Normal    C/D Ratio 0.5    Macula Focal laser scars,  macular thickening, Microaneurysms, much less, no discernible thickening    Vessels Retinopathy,, severe NPDR    Periphery Normal, good prp, room 360,,, for more laser in the future, posteriorly, and nasally             IMAGING AND PROCEDURES  Imaging and Procedures for 01/02/21  OCT, Retina - OU - Both Eyes       Right Eye Quality was good. Scan locations included subfoveal. Central Foveal Thickness: 395. Findings include abnormal foveal contour, cystoid macular edema.   Left  Eye Quality was good. Scan locations included subfoveal. Central Foveal Thickness: 335. Findings include abnormal foveal contour.   Notes  Now 6-week status post injection intravitreal Iluvien for long-term steroid slow release OD for CSME   OS stable overall, with micro CME not progressive     Color Fundus Photography Optos - OU - Both Eyes       Right Eye Progression has  worsened. Disc findings include normal observations. Macula : edema, microaneurysms.   Left Eye Progression has been stable. Disc findings include normal observations. Macula : normal observations. Vessels : normal observations. Periphery : normal observations.   Notes Good PRP nearly 360, some small region inferonasal available for more laser but clear media at this time with quiet PDR, decreased CSME overall, implant not seen   OS with severe NPDR             ASSESSMENT/PLAN:  Severe nonproliferative diabetic retinopathy of right eye, with macular edema, associated with type 2 diabetes mellitus (HCC) Now some 6-week status post injection intravitreal Iluvien slow release steroid for long-term control of CSME responsive previously to Ozurdex  Due to visual acuity is no improvement today with 20/150 vision now present OD.  I reassured patient that the blurry vision from teary eyes is not a disease of the right eye  Severe nonproliferative diabetic retinopathy of left eye (HCC) We will continue to monitor OS     ICD-10-CM   1. Severe nonproliferative diabetic retinopathy of right eye, with macular edema, associated with type 2 diabetes mellitus (HCC)  E11.3411 OCT, Retina - OU - Both Eyes    Color Fundus Photography Optos - OU - Both Eyes    2. Severe nonproliferative diabetic retinopathy of left eye without macular edema associated with type 2 diabetes mellitus (HCC)  W65.6812       1.  OD, follow-up today with chronic CME, CSME from diabetic macular edema, responsive only to steroids.  Now 6  weeks postinjection Iluvien implant, chronic CSME stabilized although not completely resolved.  Acuity has improved.  Complaints of blurred vision associated with teary eyes I assured the patient is not in disease.  2.  We will continue to monitor the degree of diabetic retinopathy in the future.  3.  Ophthalmic Meds Ordered this visit:  No orders of the defined types were placed in this encounter.      Return for As scheduled, DILATE OU, OCT, COLOR FP.  There are no Patient Instructions on file for this visit.   Explained the diagnoses, plan, and follow up with the patient and they expressed understanding.  Patient expressed understanding of the importance of proper follow up care.   Alford Highland Lanette Ell M.D. Diseases & Surgery of the Retina and Vitreous [facility] 01/02/21     Abbreviations: M myopia (nearsighted); A astigmatism; H hyperopia (farsighted); P presbyopia; Mrx spectacle prescription;  CTL contact lenses; OD right eye; OS left eye; OU both eyes  XT exotropia; ET esotropia; PEK punctate epithelial keratitis; PEE punctate epithelial erosions; DES dry eye syndrome; MGD meibomian gland dysfunction; ATs artificial tears; PFAT's preservative free artificial tears; NSC nuclear sclerotic cataract; PSC posterior subcapsular cataract; ERM epi-retinal membrane; PVD posterior vitreous detachment; RD retinal detachment; DM diabetes mellitus; DR diabetic retinopathy; NPDR non-proliferative diabetic retinopathy; PDR proliferative diabetic retinopathy; CSME clinically significant macular edema; DME diabetic macular edema; dbh dot blot hemorrhages; CWS cotton wool spot; POAG primary open angle glaucoma; C/D cup-to-disc ratio; HVF humphrey visual field; GVF goldmann visual field; OCT optical coherence tomography; IOP intraocular pressure; BRVO Branch retinal vein occlusion; CRVO central retinal vein occlusion; CRAO central retinal artery occlusion; BRAO branch retinal artery  occlusion; RT retinal tear; SB scleral buckle; PPV pars plana vitrectomy; VH Vitreous hemorrhage; PRP panretinal laser photocoagulation; IVK intravitreal kenalog; VMT vitreomacular traction; MH Macular hole;  NVD neovascularization of the disc; NVE neovascularization elsewhere; AREDS age related eye  disease study; ARMD age related macular degeneration; POAG primary open angle glaucoma; EBMD epithelial/anterior basement membrane dystrophy; ACIOL anterior chamber intraocular lens; IOL intraocular lens; PCIOL posterior chamber intraocular lens; Phaco/IOL phacoemulsification with intraocular lens placement; Gardner photorefractive keratectomy; LASIK laser assisted in situ keratomileusis; HTN hypertension; DM diabetes mellitus; COPD chronic obstructive pulmonary disease

## 2021-01-02 NOTE — Assessment & Plan Note (Signed)
Now some 6-week status post injection intravitreal Iluvien slow release steroid for long-term control of CSME responsive previously to Ozurdex  Due to visual acuity is no improvement today with 20/150 vision now present OD.  I reassured patient that the blurry vision from teary eyes is not a disease of the right eye

## 2021-01-02 NOTE — Assessment & Plan Note (Signed)
We will continue to monitor OS

## 2021-01-03 ENCOUNTER — Other Ambulatory Visit: Payer: Self-pay | Admitting: Family Medicine

## 2021-01-03 DIAGNOSIS — E11319 Type 2 diabetes mellitus with unspecified diabetic retinopathy without macular edema: Secondary | ICD-10-CM

## 2021-01-04 ENCOUNTER — Other Ambulatory Visit: Payer: Self-pay | Admitting: Family Medicine

## 2021-01-04 DIAGNOSIS — E7800 Pure hypercholesterolemia, unspecified: Secondary | ICD-10-CM

## 2021-01-07 ENCOUNTER — Telehealth (HOSPITAL_BASED_OUTPATIENT_CLINIC_OR_DEPARTMENT_OTHER): Payer: Self-pay

## 2021-01-16 DIAGNOSIS — E119 Type 2 diabetes mellitus without complications: Secondary | ICD-10-CM | POA: Diagnosis not present

## 2021-01-16 DIAGNOSIS — Z794 Long term (current) use of insulin: Secondary | ICD-10-CM | POA: Diagnosis not present

## 2021-01-23 ENCOUNTER — Other Ambulatory Visit: Payer: Self-pay

## 2021-01-24 ENCOUNTER — Encounter: Payer: Self-pay | Admitting: Family Medicine

## 2021-01-24 ENCOUNTER — Ambulatory Visit: Payer: Medicare Other | Admitting: Family Medicine

## 2021-01-24 ENCOUNTER — Ambulatory Visit (INDEPENDENT_AMBULATORY_CARE_PROVIDER_SITE_OTHER): Payer: Medicare Other | Admitting: Family Medicine

## 2021-01-24 VITALS — BP 124/74 | HR 93 | Temp 97.6°F | Ht 62.0 in | Wt 131.8 lb

## 2021-01-24 DIAGNOSIS — I10 Essential (primary) hypertension: Secondary | ICD-10-CM

## 2021-01-24 DIAGNOSIS — Z23 Encounter for immunization: Secondary | ICD-10-CM

## 2021-01-24 DIAGNOSIS — Z0000 Encounter for general adult medical examination without abnormal findings: Secondary | ICD-10-CM

## 2021-01-24 NOTE — Progress Notes (Signed)
Established Patient Office Visit  Subjective:  Patient ID: Marie [name], female    DOB: May 31, 1955  Age: 65 y.o. MRN: [id number]  CC:  Chief Complaint  Patient presents with   Follow-up    6 month follow patient would like PCV 20.     HPI Marie [name] presents for for flu shot and Prevnar 20 and also a question about her renal function.  She is concerned about her kidney function and how her diabetes medicines may be affecting it.  Diabetes is under better control with most recent hemoglobin A1c of 7.6.  She continues with valsartan and [place].  Recent micro albumin creatinine ratio was 1.2. Past Medical History:  Diagnosis Date   CVA (cerebral vascular accident) (HCC) 2015   Depression    Diabetes (HCC)    t2   Gallstones    Hyperlipemia    Hypertension    Migraines     Past Surgical History:  Procedure Laterality Date   BREAST BIOPSY Right 05/2019   CESAREAN SECTION     x2   CHOLECYSTECTOMY N/A 05/25/2016   Procedure: LAPAROSCOPIC CHOLECYSTECTOMY WITH INTRAOPERATIVE CHOLANGIOGRAM;  Surgeon: Luretha Murphy, MD;  Location: [facility];  Service: General;  Laterality: N/A;   IR GENERIC HISTORICAL  03/13/2016   IR PERC CHOLECYSTOSTOMY 03/13/2016 [name], MD [facility]   IR GENERIC HISTORICAL  04/28/2016   IR RADIOLOGIST EVAL & MGMT 04/28/2016 [name], MD [facility]   LOOP RECORDER IMPLANT N/A 11/29/2013   Procedure: LOOP RECORDER IMPLANT;  Surgeon: Marinus Maw, MD;  Location[facility] CATH LAB;  Service: Cardiovascular;  Laterality: N/A;   TEE WITHOUT CARDIOVERSION N/A 08/24/2013   Procedure: TRANSESOPHAGEAL ECHOCARDIOGRAM (TEE);  Surgeon: [name], MD;  Location: [facility] ENDOSCOPY;  Service: Cardiovascular;  Laterality: N/A;    Family History  Problem Relation Age of Onset   Stomach cancer Mother    High blood pressure Mother    Diabetes Mother    Diabetes Maternal Grandfather    High blood pressure Maternal Grandfather    Heart disease Neg Hx     Social  History   Socioeconomic History   Marital status: Married    Spouse name: Not on file   Number of children: 3   Years of education: MBA   Highest education level: Not on file  Occupational History    Employer: [employer]    Comment: [employer]  Tobacco Use   Smoking status: Never   Smokeless tobacco: Never  Vaping Use   Vaping Use: Never used  Substance and Sexual Activity   Alcohol use: No    Alcohol/week: 1.0 standard drink    Types: 1 Standard drinks or equivalent per week    Comment: Social   Drug use: No   Sexual activity: Not on file  Other Topics Concern   Not on file  Social History Narrative   Patient lives at home with [name].   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         Social Determinants of Health   Financial Resource Strain: Not on file  Food Insecurity: Not on file  Transportation Needs: Not on file  Physical Activity: Not on file  Stress: Not on file  Social Connections: Not on file  Intimate Partner Violence: Not on file    Outpatient Medications Prior to Visit  Medication Sig Dispense Refill  aspirin EC 81 MG tablet Take 81 mg by mouth at bedtime.     atorvastatin (LIPITOR) 80 MG tablet TAKE 1 TABLET(80 MG) BY MOUTH DAILY 90 tablet 3   Cholecalciferol (VITAMIN D3) 3000 units TABS Take 3,000 Units by mouth at bedtime.     Continuous Blood Gluc Sensor (FREESTYLE LIBRE 2 SENSOR) MISC 2 Devices by Does not apply route every 14 (fourteen) days. 6 each 3   dapagliflozin propanediol (FARXIGA) 10 MG TABS tablet Take 1 tablet (10 mg total) by mouth daily. 90 tablet 3   glucose blood test strip 1 each by Other route as needed for other. Use as instructed     metFORMIN (GLUCOPHAGE-XR) 500 MG 24 hr tablet TAKE 2 TABLETS(1000 MG) BY MOUTH IN THE MORNING AND AT BEDTIME 360 tablet 3   Multiple Vitamin (MULTIVITAMIN WITH MINERALS) TABS tablet Take 1 tablet by mouth  daily.     repaglinide (PRANDIN) 2 MG tablet 2 before breakfast 1 at lunch and 1 before dinner 360 tablet 1   terconazole (TERAZOL 7) 0.4 % vaginal cream Place 1 applicator vaginally at bedtime. Use for seven days 45 g 0   valsartan (DIOVAN) 80 MG tablet Take 1 tablet (80 mg total) by mouth daily. 90 tablet 3   No facility-administered medications prior to visit.    Allergies  Allergen Reactions   Penicillins Other (See Comments)    Driving couldn't see and head felt heavy  Has patient had a PCN reaction causing immediate rash, facial/tongue/throat swelling, SOB or lightheadedness with hypotension: No Has patient had a PCN reaction causing severe rash involving mucus membranes or skin necrosis: No Has patient had a PCN reaction that required hospitalization: No Has patient had a PCN reaction occurring within the last 10 years: No If all of the above answers are "NO", then may proceed with Cephalosporin use.     ROS Review of Systems  Constitutional: Negative.   Respiratory: Negative.    Cardiovascular: Negative.   Gastrointestinal: Negative.   Genitourinary:  Negative for decreased urine volume, difficulty urinating, frequency and hematuria.  Neurological: Negative.      Objective:    Physical Exam  BP 124/74 (BP Location: Right Arm, Patient Position: Sitting, Cuff Size: Normal)   Pulse 93   Temp 97.6 F (36.4 C) (Temporal)   Ht 5\' 2"  (1.575 m)   Wt 131 lb 12.8 oz (59.8 kg)   SpO2 98%   BMI 24.11 kg/m  Wt Readings from Last 3 Encounters:  01/24/21 131 lb 12.8 oz (59.8 kg)  12/25/20 131 lb (59.4 kg)  12/16/20 132 lb (59.9 kg)     Health Maintenance Due  Topic Date Due   DEXA SCAN  Never done   INFLUENZA VACCINE  11/04/2020    There are no preventive care reminders to display for this patient.  Lab Results  Component Value Date   TSH 2.42 10/13/2018   Lab Results  Component Value Date   WBC 5.7 09/24/2020   HGB 13.8 09/24/2020   HCT 41.4 09/24/2020    MCV 85.6 09/24/2020   PLT 211.0 09/24/2020   Lab Results  Component Value Date   NA 142 12/13/2020   K 4.9 12/13/2020   CO2 29 12/13/2020   GLUCOSE 170 (H) 12/13/2020   BUN 17 12/13/2020   CREATININE 0.84 12/13/2020   BILITOT 0.5 09/24/2020   ALKPHOS 70 09/24/2020   AST 17 09/24/2020   ALT 24 09/24/2020   PROT 7.1 09/24/2020   ALBUMIN 4.4  09/24/2020   CALCIUM 9.4 12/13/2020   ANIONGAP 11 05/21/2016   GFR 72.35 12/13/2020   Lab Results  Component Value Date   CHOL 136 10/25/2020   Lab Results  Component Value Date   HDL 49.20 10/25/2020   Lab Results  Component Value Date   LDLCALC 68 10/25/2020   Lab Results  Component Value Date   TRIG 94.0 10/25/2020   Lab Results  Component Value Date   CHOLHDL 3 10/25/2020   Lab Results  Component Value Date   HGBA1C 7.6 (H) 12/13/2020      Assessment & Plan:   Problem List Items Addressed This Visit       Cardiovascular and Mediastinum   Essential hypertension - Primary     Other   Healthcare maintenance    No orders of the defined types were placed in this encounter.   Follow-up: Return Follow-up scheduled on 26 November..  Has follow-up scheduled for November.  Reassured her her kidneys are functioning well.  We will continue valsartan blood pressure control and renal protection.  Prevnar 20 next year.  Continue follow-up with all consultants.  Flu vaccine today.  By Guerry Minors COVID-vaccine through her pharmacy.  Mliss Sax, MD

## 2021-02-10 ENCOUNTER — Telehealth: Payer: Self-pay | Admitting: Endocrinology

## 2021-02-10 NOTE — Telephone Encounter (Signed)
Pt calling in to inform us that her sugars have been going up and down. Her CGM freestyle libre 2 has been alarming for sugars reading 63-64 randomly throughout the day. Eating a candy now as we converse. This highest reading at 214.   Pt contact [phone]

## 2021-02-12 ENCOUNTER — Encounter (HOSPITAL_BASED_OUTPATIENT_CLINIC_OR_DEPARTMENT_OTHER): Payer: Self-pay | Admitting: Cardiology

## 2021-02-12 ENCOUNTER — Ambulatory Visit (INDEPENDENT_AMBULATORY_CARE_PROVIDER_SITE_OTHER): Payer: Medicare Other | Admitting: Cardiology

## 2021-02-12 ENCOUNTER — Other Ambulatory Visit: Payer: Self-pay

## 2021-02-12 VITALS — BP 130/76 | HR 80 | Ht 62.5 in | Wt 135.8 lb

## 2021-02-12 DIAGNOSIS — E119 Type 2 diabetes mellitus without complications: Secondary | ICD-10-CM

## 2021-02-12 DIAGNOSIS — I10 Essential (primary) hypertension: Secondary | ICD-10-CM | POA: Diagnosis not present

## 2021-02-12 DIAGNOSIS — Z8673 Personal history of transient ischemic attack (TIA), and cerebral infarction without residual deficits: Secondary | ICD-10-CM

## 2021-02-12 DIAGNOSIS — Z7189 Other specified counseling: Secondary | ICD-10-CM

## 2021-02-12 NOTE — Progress Notes (Signed)
Cardiology Office Note:    Date:  02/12/2021   ID:  Marie [name], DOB Jan 22, 1956, MRN [id number]  PCP:  Libby Maw, MD  Cardiologist:  Buford Dresser, MD  Referring MD: Libby Maw,*   CC: follow up  History of Present Illness:    Marie [name] is a 65 y.o. female with a hx of CVA, hypertension, type II diabetes, dyslipidemia, carotid stenosis who is seen for follow up today. I initially met her 05/01/19 as a new patient to me/prior patient of Dr. Bettina Gavia for the evaluation and management of CVA and hypertension.  She was last seen by Dr. Bettina Gavia on 02/04/2018. She has a history of CVA. Workup unrevealing, loop recorder has not shown atrial fibrillation during the entire life of the device (implanted 2015). Her hypertension was well controlled on ACEi and amlodipine.  Today: She reports that control of her diabetes continues to fluctuate. Recently she was started on repaglinide. Per her log, she often sees low blood sugars in the 60s or 70s, including earlier today. If she tries to stand up too quickly she feels lightheaded as if she is being pushed back. Lately she has been keeping candy with her to help with low blood sugars.  At night, she occasionally develops hot flashes.  She has been keeping up with her exercise routines and continues to watch her diet.  She remains compliant with atorvastatin and 81 mg Aspirin. She is up to date with her flu vaccine.  Also, she is concerned for her son who has down syndrome and is unable to explain how he feels. She reports 2 incidents of finding him lying on the restroom floor due to feeling ill with diaphoresis in the middle of the night.   She denies any palpitations, or chest pain. No headaches, syncope, orthopnea, PND, lower extremity edema or exertional symptoms.   Past Medical History:  Diagnosis Date   CVA (cerebral vascular accident) ([place]) 2015   Depression    Diabetes ([place])    t2   Gallstones     Hyperlipemia    Hypertension    Migraines     Past Surgical History:  Procedure Laterality Date   BREAST BIOPSY Right 05/2019   CESAREAN SECTION     x2   CHOLECYSTECTOMY N/A 05/25/2016   Procedure: LAPAROSCOPIC CHOLECYSTECTOMY WITH INTRAOPERATIVE CHOLANGIOGRAM;  Surgeon: Johnathan Hausen, MD;  Location: [facility];  Service: General;  Laterality: N/A;   IR GENERIC HISTORICAL  03/13/2016   IR PERC CHOLECYSTOSTOMY 03/13/2016 Sandi Mariscal, MD [facility] RAD   IR GENERIC HISTORICAL  04/28/2016   IR RADIOLOGIST EVAL & MGMT 04/28/2016 Jacqulynn Cadet, MD [facility]   LOOP RECORDER IMPLANT N/A 11/29/2013   Procedure: LOOP RECORDER IMPLANT;  Surgeon: Evans Lance, MD;  Location: [facility] CATH LAB;  Service: Cardiovascular;  Laterality: N/A;   TEE WITHOUT CARDIOVERSION N/A 08/24/2013   Procedure: TRANSESOPHAGEAL ECHOCARDIOGRAM (TEE);  Surgeon: Sanda Klein, MD;  Location: [facility] ENDOSCOPY;  Service: Cardiovascular;  Laterality: N/A;    Current Medications: Current Outpatient Medications on File Prior to Visit  Medication Sig   aspirin EC 81 MG tablet Take 81 mg by mouth at bedtime.   atorvastatin (LIPITOR) 80 MG tablet TAKE 1 TABLET(80 MG) BY MOUTH DAILY   Cholecalciferol (VITAMIN D3) 3000 units TABS Take 3,000 Units by mouth at bedtime.   Continuous Blood Gluc Sensor (FREESTYLE LIBRE 2 SENSOR) MISC 2 Devices by Does not apply route every 14 (fourteen) days.   dapagliflozin propanediol (FARXIGA) 10  MG TABS tablet Take 1 tablet (10 mg total) by mouth daily.   glucose blood test strip 1 each by Other route as needed for other. Use as instructed   metFORMIN (GLUCOPHAGE-XR) 500 MG 24 hr tablet TAKE 2 TABLETS(1000 MG) BY MOUTH IN THE MORNING AND AT BEDTIME   Multiple Vitamin (MULTIVITAMIN WITH MINERALS) TABS tablet Take 1 tablet by mouth daily.   repaglinide (PRANDIN) 2 MG tablet 2 before breakfast 1 at lunch and 1 before dinner   terconazole (TERAZOL 7) 0.4 % vaginal cream Place 1 applicator vaginally at  bedtime. Use for seven days   valsartan (DIOVAN) 80 MG tablet Take 1 tablet (80 mg total) by mouth daily.   No current facility-administered medications on file prior to visit.     Allergies:   Penicillins   Social History   Tobacco Use   Smoking status: Never   Smokeless tobacco: Never  Vaping Use   Vaping Use: Never used  Substance Use Topics   Alcohol use: No    Alcohol/week: 1.0 standard drink    Types: 1 Standard drinks or equivalent per week    Comment: Social   Drug use: No    Family History: family history includes Diabetes in her maternal grandfather and mother; High blood pressure in her maternal grandfather and mother; Stomach cancer in her mother. There is no history of Heart disease.  ROS:   Please see the history of present illness.   (+) Lightheadedness/Dizziness (+) Hot flashes Additional pertinent ROS otherwise unremarkable.   EKGs/Labs/Other Studies Reviewed:    The following studies were reviewed today:  TEE 08/24/13 - Left ventricle: Systolic function was normal. Wall motion was    normal; there were no regional wall motion abnormalities.  - Left atrium: No evidence of thrombus in the atrial cavity or    appendage.  - Right atrium: No evidence of thrombus in the atrial cavity or    appendage.  - Atrial septum: No defect or patent foramen ovale was identified.    Echo contrast study showed no right-to-left atrial level shunt,    at baseline or with provocation. Echo contrast study showed no    right-to-left shunt, following an increase in RA pressure induced    by provocative maneuvers.    EKG:  EKG is personally reviewed.   02/12/2021: not ordered today 11/04/2020: NSR at 74 bpm 11/20/2019: EKG was not ordered. 05/01/19: sinus rhythm with sinus arrhythmia  Recent Labs: 09/24/2020: ALT 24; Hemoglobin 13.8; Platelets 211.0 12/13/2020: BUN 17; Creatinine, Ser 0.84; Potassium 4.9; Sodium 142   Recent Lipid Panel    Component Value Date/Time    CHOL 136 10/25/2020 0833   TRIG 94.0 10/25/2020 0833   HDL 49.20 10/25/2020 0833   CHOLHDL 3 10/25/2020 0833   VLDL 18.8 10/25/2020 0833   LDLCALC 68 10/25/2020 0833   LDLDIRECT 136.0 09/24/2020 0850    Physical Exam:    VS:  BP 130/76 (BP Location: Right Arm, Patient Position: Sitting, Cuff Size: Normal)   Pulse 80   Ht 5' 2.5" (1.588 m)   Wt 135 lb 12.8 oz (61.6 kg)   SpO2 98%   BMI 24.44 kg/m     Wt Readings from Last 3 Encounters:  02/12/21 135 lb 12.8 oz (61.6 kg)  01/24/21 131 lb 12.8 oz (59.8 kg)  12/25/20 131 lb (59.4 kg)    GEN: Well nourished, well developed in no acute distress HEENT: Normal, moist mucous membranes NECK: No JVD CARDIAC: regular rhythm, normal  S1 and S2, no rubs or gallops. No murmur. VASCULAR: Radial and DP pulses 2+ bilaterally. No carotid bruits RESPIRATORY:  Clear to auscultation without rales, wheezing or rhonchi  ABDOMEN: Soft, non-tender, non-distended MUSCULOSKELETAL:  Ambulates independently SKIN: Warm and dry, no edema NEUROLOGIC:  Alert and oriented x 3. No focal neuro deficits noted. PSYCHIATRIC:  Normal affect   ASSESSMENT:    1. Essential hypertension   2. Type 2 diabetes mellitus without obesity ([place])   3. History of CVA (cerebrovascular accident)   4. Cardiac risk counseling    PLAN:    History of CVA: no further events -no further strokes -no detection of afib on loop -continue aspirin, atorvastatin. We reviewed how this affects ASCVD risk again today  Hypertension: goal <130/80, at goal today -tolerating valsartan well, continue. Labs reviewed  Type II diabetes, without obesity, with history of ASCVD: -she is already on SGLT2i -with history of stroke, would recommend GLP1RA, but this was stopped in the past. Defer to her diabetes team on this. Also on metformin and repaglinide currently, having some low sugars, has upcoming follow up in two days  Cardiac risk counseling and secondary prevention  recommendations: -recommend heart healthy/Mediterranean diet, with whole grains, fruits, vegetable, fish, lean meats, nuts, and olive oil. Limit salt. -recommend moderate walking, 3-5 times/week for 30-50 minutes each session. Aim for at least 150 minutes.week. Goal should be pace of 3 miles/hours, or walking 1.5 miles in 30 minutes -recommend avoidance of tobacco products. Avoid excess alcohol.  Plan for follow up: 6 months or sooner PRN  Buford Dresser, MD, PhD [place]  [facility]    Medication Adjustments/Labs and Tests Ordered: Current medicines are reviewed at length with the patient today.  Concerns regarding medicines are outlined above.   No orders of the defined types were placed in this encounter.  No orders of the defined types were placed in this encounter.  Patient Instructions  Medication Instructions:  Your Physician recommend you continue on your current medication as directed.    *If you need a refill on your cardiac medications before your next appointment, please call your pharmacy*   Lab Work: None ordered today   Testing/Procedures: None ordered today   Follow-Up: At [facility], you and your health needs are our priority.  As part of our continuing mission to provide you with exceptional heart care, we have created designated Provider Care Teams.  These Care Teams include your primary Cardiologist (physician) and Advanced Practice Providers (APPs -  Physician Assistants and Nurse Practitioners) who all work together to provide you with the care you need, when you need it.  We recommend signing up for the patient portal called "MyChart".  Sign up information is provided on this After Visit Summary.  MyChart is used to connect with patients for Virtual Visits (Telemedicine).  Patients are able to view lab/test results, encounter notes, upcoming appointments, etc.  Non-urgent messages can be sent to your provider as well.   To learn more about  what you can do with MyChart, go to [email or web address].    Your next appointment:   6 month(s)  The format for your next appointment:   In Person  Provider:   Buford Dresser, MD     [facility],acting as a scribe for Buford Dresser, MD.,have documented all relevant documentation on the behalf of Buford Dresser, MD,as directed by  Buford Dresser, MD while in the presence of Buford Dresser, MD.  I, Buford Dresser, MD, have reviewed all documentation  for this visit. The documentation on 02/12/21 for the exam, diagnosis, procedures, and orders are all accurate and complete.   Signed, Buford Dresser, MD PhD 02/12/2021  [place]

## 2021-02-12 NOTE — Patient Instructions (Signed)

## 2021-02-14 ENCOUNTER — Other Ambulatory Visit: Payer: Self-pay

## 2021-02-14 ENCOUNTER — Other Ambulatory Visit (INDEPENDENT_AMBULATORY_CARE_PROVIDER_SITE_OTHER): Payer: Medicare Other

## 2021-02-14 ENCOUNTER — Other Ambulatory Visit: Payer: Medicare Other

## 2021-02-14 DIAGNOSIS — E1165 Type 2 diabetes mellitus with hyperglycemia: Secondary | ICD-10-CM

## 2021-02-14 LAB — GLUCOSE, RANDOM: Glucose, Bld: 196 mg/dL — ABNORMAL HIGH (ref 70–99)

## 2021-02-15 LAB — FRUCTOSAMINE: Fructosamine: 262 umol/L (ref 0–285)

## 2021-02-17 ENCOUNTER — Ambulatory Visit: Payer: Medicare Other | Admitting: Endocrinology

## 2021-02-17 ENCOUNTER — Encounter: Payer: Self-pay | Admitting: Endocrinology

## 2021-02-17 ENCOUNTER — Other Ambulatory Visit: Payer: Self-pay

## 2021-02-17 VITALS — BP 122/80 | HR 77 | Ht 63.0 in | Wt 134.4 lb

## 2021-02-17 DIAGNOSIS — I10 Essential (primary) hypertension: Secondary | ICD-10-CM

## 2021-02-17 DIAGNOSIS — E1165 Type 2 diabetes mellitus with hyperglycemia: Secondary | ICD-10-CM

## 2021-02-17 NOTE — Progress Notes (Signed)
Patient ID: Marie [name], female   DOB: 1955-08-12, 65 y.o.   MRN: [id number]           Reason for Appointment: Follow-up for Type 2 Diabetes   History of Present Illness:          Date of diagnosis of type 2 diabetes mellitus: 2010       Background history:   She was likely treated with Metformin alone for the first few years and subsequently given Januvia In 2018 she was also given [place] in addition Current records indicate her highest A1c was 9.6 in 03/2017.  Has been progressively improving since then She was also started on Rybelsus in 02/2019  Recent history:   A1c is 7.6, previously was 7.4 Last fructosamine 260  Non-insulin hypoglycemic drugs the patient is taking are: Metformin 1.5g daily, Farxiga 10 mg daily, Prandin 2.0 mg before breakfast and 1 mg before dinner   Current management, blood sugar patterns and problems identified  Her blood sugars have been relatively better controlled since her last visit Although she was supposed to see the dietitian she appears to have made some changes in her diet especially at breakfast  Her main concern recently has been tendency to blood sugars in the upper 60s and she thinks she is symptomatic from this  She cannot check her fingerstick because she says she bleeds excessively  ACCURACY of the [name] is fairly close as judged by her lab glucose and her [name] tracing She is taking her Prandin before breakfast and dinner  Also on her own is also taking this at lunchtime regularly even though she is only eating a banana as a carbohydrate and not eating a ham sandwich as before She is taking 3 separate doses of metformin for a total of 1500 mg  Not clear if she is taking her Farxiga at dinnertime instead of in the morning She has increased her exercise and is regular with this HYPOGLYCEMIA: She thinks that when her blood sugars are in the 60s she feels off balance like she is going to fall back but no symptoms of weakness, shakiness,  sweating or hunger.  She thinks eating a piece of candy and lying down helps her feel better Has had some low sugar alarms during the night but not in the last few days         Side effects from medications have been: Decreased appetite from Rybelsus   Typical meal intake: Breakfast is egg, toast .              Exercise:  Exercise equipment, ? elliptical, using for 22  min daily   Glucose monitoring:  Using freestyle libre   Data for the last 2 weeks  Blood sugars may be low normal around 6 AM or between 2 PM-7 PM but only rarely low in the afternoons POSTPRANDIAL readings after breakfast are highly variable but generally above target except once No high readings after dinner except twice  CGM use % of time 85  2-week average/GV   Time in range      83%  % Time Above 180 16  % Time above 250   % Time Below 70 1     PRE-MEAL Fasting Lunch Dinner Bedtime Overall  Glucose range:       Averages:        POST-MEAL PC Breakfast PC Lunch PC Dinner  Glucose range:     Averages:        '  CGM  use % of time 62  2-week average/GV 165/26  Time in range    67    %  % Time Above 180 29+4  % Time above 250   % Time Below 70 0     PRE-MEAL Fasting Lunch Dinner Bedtime Overall  Glucose range:       Averages: 147    165   POST-MEAL PC Breakfast PC Lunch PC Dinner  Glucose range:     Averages: 193 186 177      Dietician visit, most recent: 11/14  Weight history:   Wt Readings from Last 3 Encounters:  02/17/21 134 lb 6.4 oz (61 kg)  02/12/21 135 lb 12.8 oz (61.6 kg)  01/24/21 131 lb 12.8 oz (59.8 kg)    Glycemic control:   Lab Results  Component Value Date   HGBA1C 7.6 (H) 12/13/2020   HGBA1C 7.4 (H) 10/25/2020   HGBA1C 7.6 (A) 09/26/2020   Lab Results  Component Value Date   MICROALBUR 0.8 12/13/2020   LDLCALC 68 10/25/2020   CREATININE 0.84 12/13/2020   Lab Results  Component Value Date   MICRALBCREAT 1.2 12/13/2020    Lab Results  Component Value  Date   FRUCTOSAMINE 262 02/14/2021   FRUCTOSAMINE 260 10/25/2020    Lab on 02/14/2021  Component Date Value Ref Range Status   Fructosamine 02/14/2021 262  0 - 285 umol/L Final   Comment: Published reference interval for apparently healthy subjects between age 77 and 26 is 32 - 285 umol/L and in a poorly controlled diabetic population is 228 - 563 umol/L with a mean of 396 umol/L.    Glucose, Bld 02/14/2021 196 (A)  70 - 99 mg/dL Final    Allergies as of 02/17/2021       Reactions   Penicillins Other (See Comments)   Driving couldn't see and head felt heavy  Has patient had a PCN reaction causing immediate rash, facial/tongue/throat swelling, SOB or lightheadedness with hypotension: No Has patient had a PCN reaction causing severe rash involving mucus membranes or skin necrosis: No Has patient had a PCN reaction that required hospitalization: No Has patient had a PCN reaction occurring within the last 10 years: No If all of the above answers are "NO", then may proceed with Cephalosporin use.        Medication List        Accurate as of February 17, 2021  4:26 PM. If you have any questions, ask your nurse or doctor.          aspirin EC 81 MG tablet Take 81 mg by mouth at bedtime.   atorvastatin 80 MG tablet Commonly known as: LIPITOR TAKE 1 TABLET(80 MG) BY MOUTH DAILY   dapagliflozin propanediol 10 MG Tabs tablet Commonly known as: Farxiga Take 1 tablet (10 mg total) by mouth daily.   FreeStyle Libre 2 Sensor Misc 2 Devices by Does not apply route every 14 (fourteen) days.   glucose blood test strip 1 each by Other route as needed for other. Use as instructed   metFORMIN 500 MG 24 hr tablet Commonly known as: GLUCOPHAGE-XR TAKE 2 TABLETS(1000 MG) BY MOUTH IN THE MORNING AND AT BEDTIME   multivitamin with minerals Tabs tablet Take 1 tablet by mouth daily.   repaglinide 2 MG tablet Commonly known as: Prandin 2 before breakfast 1 at lunch and 1 before  dinner   terconazole 0.4 % vaginal cream Commonly known as: TERAZOL 7 Place 1 applicator vaginally at bedtime. Use for  seven days   valsartan 80 MG tablet Commonly known as: Diovan Take 1 tablet (80 mg total) by mouth daily.   Vitamin D3 75 MCG (3000 UT) Tabs Take 3,000 Units by mouth at bedtime.        Allergies:  Allergies  Allergen Reactions   Penicillins Other (See Comments)    Driving couldn't see and head felt heavy  Has patient had a PCN reaction causing immediate rash, facial/tongue/throat swelling, SOB or lightheadedness with hypotension: No Has patient had a PCN reaction causing severe rash involving mucus membranes or skin necrosis: No Has patient had a PCN reaction that required hospitalization: No Has patient had a PCN reaction occurring within the last 10 years: No If all of the above answers are "NO", then may proceed with Cephalosporin use.     Past Medical History:  Diagnosis Date   CVA (cerebral vascular accident) ([facility]) 2015   Depression    Diabetes ([facility])    t2   Gallstones    Hyperlipemia    Hypertension    Migraines     Past Surgical History:  Procedure Laterality Date   BREAST BIOPSY Right 05/2019   CESAREAN SECTION     x2   CHOLECYSTECTOMY N/A 05/25/2016   Procedure: LAPAROSCOPIC CHOLECYSTECTOMY WITH INTRAOPERATIVE CHOLANGIOGRAM;  Surgeon: Luretha Murphy, MD;  Location: [facility];  Service: General;  Laterality: N/A;   IR GENERIC HISTORICAL  03/13/2016   IR PERC CHOLECYSTOSTOMY 03/13/2016 [name], MD [facility]   IR GENERIC HISTORICAL  04/28/2016   IR RADIOLOGIST EVAL & MGMT 04/28/2016 [name], MD [facility]   LOOP RECORDER IMPLANT N/A 11/29/2013   Procedure: LOOP RECORDER IMPLANT;  Surgeon: Marinus Maw, MD;  Location: [facility] CATH LAB;  Service: Cardiovascular;  Laterality: N/A;   TEE WITHOUT CARDIOVERSION N/A 08/24/2013   Procedure: TRANSESOPHAGEAL ECHOCARDIOGRAM (TEE);  Surgeon: [name], MD;  Location: [facility] ENDOSCOPY;   Service: Cardiovascular;  Laterality: N/A;    Family History  Problem Relation Age of Onset   Stomach cancer Mother    High blood pressure Mother    Diabetes Mother    Diabetes Maternal Grandfather    High blood pressure Maternal Grandfather    Heart disease Neg Hx     Social History:  reports that she has never smoked. She has never used smokeless tobacco. She reports that she does not drink alcohol and does not use drugs.   Review of Systems    Lipid history: LDL controlled with atorvastatin 40 mg. She says that she is taking her atorvastatin regularly but her prescription bottle is dated last year in July She was also supposed to change to 80 mg atorvastatin and she is not aware of this, prescription apparently was sent by PCP yesterday    Lab Results  Component Value Date   CHOL 136 10/25/2020   HDL 49.20 10/25/2020   LDLCALC 68 10/25/2020   LDLDIRECT 136.0 09/24/2020   TRIG 94.0 10/25/2020   CHOLHDL 3 10/25/2020           Hypertension: Has been present and treated with lisinopril 40 mg and amlodipine 5 mg Followed by PCP  BP Readings from Last 3 Encounters:  02/17/21 122/80  02/12/21 130/76  01/24/21 124/74    Most recent eye exam was in 11/21.  Has retinopathy  Most recent foot exam: 11/21  Currently known complications of diabetes: Retinopathy with decreased vision on the right, regularly followed by retinal surgery  Normal microalbumin in 7/21  History  of CVA with left facial numbness and speech difficulty  LABS:  Lab on 02/14/2021  Component Date Value Ref Range Status   Fructosamine 02/14/2021 262  0 - 285 umol/L Final   Comment: Published reference interval for apparently healthy subjects between age 65 and 60 is 10 - 285 umol/L and in a poorly controlled diabetic population is 228 - 563 umol/L with a mean of 396 umol/L.    Glucose, Bld 02/14/2021 196 (A)  70 - 99 mg/dL Final    Physical Examination:  BP 122/80   Pulse 77   Ht 5\' 3"   (1.6 m)   Wt 134 lb 6.4 oz (61 kg)   SpO2 99%   BMI 23.81 kg/m      Exam not indicated     ASSESSMENT:  Diabetes type 2 nonobese  See history of present illness for detailed discussion of current diabetes management, blood sugar patterns and problems identified  A1c is last 7.6, 2 months ago  Current treatment regimen is Metformin 1500 mg, Farxiga 10 mg and Prandin 3 times daily  Her blood sugar patterns were reviewed in detail today and also last week when she called Although she is having fairly good control she still has high readings after breakfast Appears to be more insulin resistant in the morning Her diet was reviewed in detail today  She is concerned about hypoglycemia but not clear if she is truly hypoglycemic as her only symptom is sensation of falling back Likely is having tendency to low sugars because of cutting back on her carbohydrates at lunch and still taking the Prandin However even with only 1 slice of bread and a half a glass of juice her blood sugars are averaging 190 AFTER breakfast  Fructosamine indicates relatively good control  Her time in target on her CGM is better at 83 compared to 67% on the last visit    PLAN:    Although she likely can benefit from seeing the dietitian and she appears to have made some changes herself  Other instructions:  1.  Instead of having orange juice at breakfast to eat a small orange or other small fruit  2.  Try to take your repaglinide 2 tablets about 15 to 20 minutes BEFORE eating in the morning  Stop taking the repaglinide at lunchtime unless eating significant carbohydrate like bread, rice or potatoes  METFORMIN to be taken 2 tablets with breakfast only  Continue to take repaglinide right before dinnertime Continue regular exercise Make sure to have some protein with every meal  Hypertension: Well-controlled    Patient Instructions  1.  Instead of having orange juice at breakfast to eat a small  orange or other small fruit  2.  Try to take your repaglinide 2 tablets about 15 to 20 minutes BEFORE eating in the morning  Stop taking the repaglinide at lunchtime unless eating significant carbohydrate like bread, rice or potatoes  METFORMIN to be taken 2 tablets with breakfast only  Continue to take repaglinide try before dinnertime         02/17/2021, 4:26 PM   Note: This office note was prepared with Dragon voice recognition system technology. Any transcriptional errors that result from this process are unintentional.

## 2021-02-17 NOTE — Patient Instructions (Signed)
1.  Instead of having orange juice at breakfast to eat a small orange or other small fruit  2.  Try to take your repaglinide 2 tablets about 15 to 20 minutes BEFORE eating in the morning  Stop taking the repaglinide at lunchtime unless eating significant carbohydrate like bread, rice or potatoes  METFORMIN to be taken 2 tablets with breakfast only  Continue to take repaglinide try before dinnertime

## 2021-02-24 ENCOUNTER — Ambulatory Visit (INDEPENDENT_AMBULATORY_CARE_PROVIDER_SITE_OTHER): Payer: Medicare Other | Admitting: Ophthalmology

## 2021-02-24 ENCOUNTER — Encounter (INDEPENDENT_AMBULATORY_CARE_PROVIDER_SITE_OTHER): Payer: Self-pay | Admitting: Ophthalmology

## 2021-02-24 ENCOUNTER — Other Ambulatory Visit: Payer: Self-pay

## 2021-02-24 DIAGNOSIS — H2512 Age-related nuclear cataract, left eye: Secondary | ICD-10-CM

## 2021-02-24 DIAGNOSIS — H26491 Other secondary cataract, right eye: Secondary | ICD-10-CM | POA: Diagnosis not present

## 2021-02-24 DIAGNOSIS — E113411 Type 2 diabetes mellitus with severe nonproliferative diabetic retinopathy with macular edema, right eye: Secondary | ICD-10-CM

## 2021-02-24 NOTE — Assessment & Plan Note (Signed)
Follow-up with Dr. Racheal Patches as scheduled consider CE IOL and/or refractive assistance

## 2021-02-24 NOTE — Progress Notes (Signed)
02/24/2021     CHIEF COMPLAINT Patient presents for  Chief Complaint  Patient presents with   Retina Follow Up      HISTORY OF PRESENT ILLNESS: Marie [name] is a 65 y.o. female who presents to the clinic today for:   HPI     Retina Follow Up   Patient presents with  Diabetic Retinopathy.  In both eyes.  This started 7 weeks ago.  Duration of 7 weeks.        Comments   7 week f/u OU with OCT  Pt c/o having a difficult time and requiring more time with adjusting and focusing from near vision while reading to distance vision. Pt also c/o requiring more light when reading small print. Pt states her PCP informed her that her diabetes is not stable at all right now, fluctuating quite a bit.      Last edited by Frederik Pear, COA on 02/24/2021  2:02 PM.      Referring physician: Sallye Lat, MD [address] [place],  [place] [address]  HISTORICAL INFORMATION:   Selected notes from the MEDICAL RECORD NUMBER    Lab Results  Component Value Date   HGBA1C 7.6 (H) 12/13/2020     CURRENT MEDICATIONS: No current outpatient medications on file. (Ophthalmic Drugs)   No current facility-administered medications for this visit. (Ophthalmic Drugs)   Current Outpatient Medications (Other)  Medication Sig   aspirin EC 81 MG tablet Take 81 mg by mouth at bedtime.   atorvastatin (LIPITOR) 80 MG tablet TAKE 1 TABLET(80 MG) BY MOUTH DAILY   Cholecalciferol (VITAMIN D3) 3000 units TABS Take 3,000 Units by mouth at bedtime.   Continuous Blood Gluc Sensor (FREESTYLE LIBRE 2 SENSOR) MISC 2 Devices by Does not apply route every 14 (fourteen) days.   dapagliflozin propanediol (FARXIGA) 10 MG TABS tablet Take 1 tablet (10 mg total) by mouth daily.   glucose blood test strip 1 each by Other route as needed for other. Use as instructed (Patient not taking: Reported on 02/17/2021)   metFORMIN (GLUCOPHAGE-XR) 500 MG 24 hr tablet TAKE 2 TABLETS(1000 MG) BY MOUTH IN THE  MORNING AND AT BEDTIME   Multiple Vitamin (MULTIVITAMIN WITH MINERALS) TABS tablet Take 1 tablet by mouth daily.   repaglinide (PRANDIN) 2 MG tablet 2 before breakfast 1 at lunch and 1 before dinner   terconazole (TERAZOL 7) 0.4 % vaginal cream Place 1 applicator vaginally at bedtime. Use for seven days   valsartan (DIOVAN) 80 MG tablet Take 1 tablet (80 mg total) by mouth daily.   No current facility-administered medications for this visit. (Other)      REVIEW OF SYSTEMS:    ALLERGIES Allergies  Allergen Reactions   Penicillins Other (See Comments)    Driving couldn't see and head felt heavy  Has patient had a PCN reaction causing immediate rash, facial/tongue/throat swelling, SOB or lightheadedness with hypotension: No Has patient had a PCN reaction causing severe rash involving mucus membranes or skin necrosis: No Has patient had a PCN reaction that required hospitalization: No Has patient had a PCN reaction occurring within the last 10 years: No If all of the above answers are "NO", then may proceed with Cephalosporin use.     PAST MEDICAL HISTORY Past Medical History:  Diagnosis Date   CVA (cerebral vascular accident) (HCC) 2015   Depression    Diabetes (HCC)    t2   Gallstones    Hyperlipemia    Hypertension  Migraines    Past Surgical History:  Procedure Laterality Date   BREAST BIOPSY Right 05/2019   CESAREAN SECTION     x2   CHOLECYSTECTOMY N/A 05/25/2016   Procedure: LAPAROSCOPIC CHOLECYSTECTOMY WITH INTRAOPERATIVE CHOLANGIOGRAM;  Surgeon: Luretha Murphy, MD;  Location: [facility] ORS;  Service: General;  Laterality: N/A;   IR GENERIC HISTORICAL  03/13/2016   IR PERC CHOLECYSTOSTOMY 03/13/2016 [name], MD [facility] RAD   IR GENERIC HISTORICAL  04/28/2016   IR RADIOLOGIST EVAL & MGMT 04/28/2016 [name], MD [facility]   LOOP RECORDER IMPLANT N/A 11/29/2013   Procedure: LOOP RECORDER IMPLANT;  Surgeon: Marinus Maw, MD;  Location: [facility] CATH LAB;   Service: Cardiovascular;  Laterality: N/A;   TEE WITHOUT CARDIOVERSION N/A 08/24/2013   Procedure: TRANSESOPHAGEAL ECHOCARDIOGRAM (TEE);  Surgeon: [name], MD;  Location: [facility] ENDOSCOPY;  Service: Cardiovascular;  Laterality: N/A;    FAMILY HISTORY Family History  Problem Relation Age of Onset   Stomach cancer Mother    High blood pressure Mother    Diabetes Mother    Diabetes Maternal Grandfather    High blood pressure Maternal Grandfather    Heart disease Neg Hx     SOCIAL HISTORY Social History   Tobacco Use   Smoking status: Never   Smokeless tobacco: Never  Vaping Use   Vaping Use: Never used  Substance Use Topics   Alcohol use: No    Alcohol/week: 1.0 standard drink    Types: 1 Standard drinks or equivalent per week    Comment: Social   Drug use: No         OPHTHALMIC EXAM:  Base Eye Exam     Visual Acuity (ETDRS)       Right Left   Dist [place] 20/150 -1 20/25 +2   Dist ph [place] NI          Tonometry (Tonopen, 2:13 PM)       Right Left   Pressure 16 14         Pupils       Dark Light Shape React APD   Right 6 5 Round Brisk +1   Left 6 4 Round Brisk None         Visual Fields (Counting fingers)       Left Right    Full Full         Extraocular Movement       Right Left    Full, Ortho Full, Ortho         Neuro/Psych     Oriented x3: Yes   Mood/Affect: Normal         Dilation     Both eyes: 1.0% Mydriacyl, 2.5% Phenylephrine @ 2:13 PM           Slit Lamp and Fundus Exam     External Exam       Right Left   External Normal Normal         Slit Lamp Exam       Right Left   Lids/Lashes Normal Normal   Conjunctiva/Sclera White and quiet White and quiet   Cornea Clear Clear   Anterior Chamber Deep and quiet Deep and quiet   Iris Round and reactive Round and reactive   Lens Posterior chamber intraocular lens, , 2+ Posterior capsular opacification 2+ Nuclear sclerosis, Cortical cataract   Anterior Vitreous  Normal Normal         Fundus Exam       Right  Left   Posterior Vitreous Posterior vitreous detachment, Central vitreous floaters, Iluvien implant visualized in the inferotemporal quadrant after injection procedure completed    Disc Normal    C/D Ratio 0.5    Macula Focal laser scars, macular thickening, Microaneurysms, much less, no discernible thickening    Vessels Retinopathy,, severe NPDR    Periphery Normal, good prp, room 360,,, for more laser in the future, posteriorly, and nasally             IMAGING AND PROCEDURES  Imaging and Procedures for 02/24/21  OCT, Retina - OU - Both Eyes       Right Eye Quality was good. Scan locations included subfoveal. Central Foveal Thickness: 350. Findings include abnormal foveal contour, cystoid macular edema, epiretinal membrane.   Left Eye Quality was good. Scan locations included subfoveal. Central Foveal Thickness: 366. Findings include abnormal foveal contour.   Notes  Now  3 months status post injection intravitreal Iluvien for long-term steroid slow release OD for CSME continued vast improvement in CSME OD yet some lingering CME noted.  Mild ERM  OS stable overall, with micro CME not progressive             ASSESSMENT/PLAN:  Severe nonproliferative diabetic retinopathy of right eye, with macular edema, associated with type 2 diabetes mellitus (HCC) 3 months post injection of Iluvien for control of chronic recurrent resistant diabetic CSME, DME OD  Visual acuity is improved from count fingers to now 20/150.  Some minor lingering CME does remain  Nuclear sclerotic cataract of left eye Follow-up with Dr. Racheal Patches as scheduled consider CE IOL and/or refractive assistance  Right posterior capsular opacification Mild OD follow-up Dr. Sallye Lat     ICD-10-CM   1. Severe nonproliferative diabetic retinopathy of right eye, with macular edema, associated with type 2 diabetes mellitus (HCC)  E11.3411 OCT,  Retina - OU - Both Eyes    2. Nuclear sclerotic cataract of left eye  H25.12     3. Right posterior capsular opacification  H26.491       1.  OD much improved acuity and much improved diabetic macular edema on Iluvien.  Now at 3 months postinjection.  2.  OS with moderate nuclear sclerotic cataract follow-up with Dr. Sallye Lat as scheduled, may need refractive assistance  3.  Ophthalmic Meds Ordered this visit:  No orders of the defined types were placed in this encounter.      Return in about 3 months (around 05/27/2021) for DILATE OU, OCT.  There are no Patient Instructions on file for this visit.   Explained the diagnoses, plan, and follow up with the patient and they expressed understanding.  Patient expressed understanding of the importance of proper follow up care.   Alford Highland Gwen Sarvis M.D. Diseases & Surgery of the Retina and Vitreous [facility] 02/24/21     Abbreviations: M myopia (nearsighted); A astigmatism; H hyperopia (farsighted); P presbyopia; Mrx spectacle prescription;  CTL contact lenses; OD right eye; OS left eye; OU both eyes  XT exotropia; ET esotropia; PEK punctate epithelial keratitis; PEE punctate epithelial erosions; DES dry eye syndrome; MGD meibomian gland dysfunction; ATs artificial tears; PFAT's preservative free artificial tears; NSC nuclear sclerotic cataract; PSC posterior subcapsular cataract; ERM epi-retinal membrane; PVD posterior vitreous detachment; RD retinal detachment; DM diabetes mellitus; DR diabetic retinopathy; NPDR non-proliferative diabetic retinopathy; PDR proliferative diabetic retinopathy; CSME clinically significant macular edema; DME diabetic macular edema; dbh dot blot hemorrhages; CWS cotton wool spot; POAG  primary open angle glaucoma; C/D cup-to-disc ratio; HVF humphrey visual field; GVF goldmann visual field; OCT optical coherence tomography; IOP intraocular pressure; BRVO Branch retinal vein occlusion;  CRVO central retinal vein occlusion; CRAO central retinal artery occlusion; BRAO branch retinal artery occlusion; RT retinal tear; SB scleral buckle; PPV pars plana vitrectomy; VH Vitreous hemorrhage; PRP panretinal laser photocoagulation; IVK intravitreal kenalog; VMT vitreomacular traction; MH Macular hole;  NVD neovascularization of the disc; NVE neovascularization elsewhere; AREDS age related eye disease study; ARMD age related macular degeneration; POAG primary open angle glaucoma; EBMD epithelial/anterior basement membrane dystrophy; ACIOL anterior chamber intraocular lens; IOL intraocular lens; PCIOL posterior chamber intraocular lens; Phaco/IOL phacoemulsification with intraocular lens placement; PRK photorefractive keratectomy; LASIK laser assisted in situ keratomileusis; HTN hypertension; DM diabetes mellitus; COPD chronic obstructive pulmonary disease

## 2021-02-24 NOTE — Assessment & Plan Note (Signed)
Mild OD follow-up Dr. Sallye Lat

## 2021-02-24 NOTE — Assessment & Plan Note (Signed)
3 months post injection of Iluvien for control of chronic recurrent resistant diabetic CSME, DME OD  Visual acuity is improved from count fingers to now 20/150.  Some minor lingering CME does remain

## 2021-02-25 ENCOUNTER — Ambulatory Visit: Payer: Medicare Other | Admitting: Family Medicine

## 2021-02-25 ENCOUNTER — Encounter (INDEPENDENT_AMBULATORY_CARE_PROVIDER_SITE_OTHER): Payer: Medicare Other | Admitting: Ophthalmology

## 2021-03-03 ENCOUNTER — Ambulatory Visit
Admission: RE | Admit: 2021-03-03 | Discharge: 2021-03-03 | Disposition: A | Discharge status: Home / Self Care | Payer: Medicare Other | Source: Ambulatory Visit | Attending: Obstetrics & Gynecology | Admitting: Obstetrics & Gynecology

## 2021-03-03 DIAGNOSIS — N6489 Other specified disorders of breast: Secondary | ICD-10-CM

## 2021-03-03 DIAGNOSIS — R922 Inconclusive mammogram: Secondary | ICD-10-CM | POA: Diagnosis not present

## 2021-03-04 ENCOUNTER — Telehealth: Payer: Self-pay | Admitting: Family Medicine

## 2021-03-04 NOTE — Telephone Encounter (Signed)
Pt had breast scan yesterday and they said everything looks fine and she would like Dr Doreene Burke to look over results

## 2021-03-04 NOTE — Telephone Encounter (Signed)
Last 2 times he came here she had an appt for eye doctor to call her and said that she hasnt heard anything back from them about the appts and she still hasnt been seen by them and said she hasnt been able to get in touch with them and she wanted to know what Dr Doreene Burke suggest

## 2021-03-06 NOTE — Telephone Encounter (Signed)
Spoke with patient who states that she have not heard from her eye doctor in 2 months but she would give them a call to see about scheduling her annual.

## 2021-03-10 ENCOUNTER — Ambulatory Visit: Payer: Medicare Other | Admitting: Dietician

## 2021-03-11 ENCOUNTER — Ambulatory Visit: Payer: Medicare Other | Admitting: Family Medicine

## 2021-03-17 ENCOUNTER — Telehealth: Payer: Self-pay | Admitting: Family Medicine

## 2021-03-17 NOTE — Telephone Encounter (Signed)
Pt dropped off info regarding PA for her and her sons medication. She said she runs out of medication at the beginning of January she gave her new insurance info for the new year that doesn't start until the first of the year and I scanned on file. I put info that she dropped off in Dr Ma Hillock folder up front

## 2021-03-24 ENCOUNTER — Other Ambulatory Visit: Payer: Self-pay

## 2021-03-24 DIAGNOSIS — E1165 Type 2 diabetes mellitus with hyperglycemia: Secondary | ICD-10-CM

## 2021-03-24 DIAGNOSIS — E11319 Type 2 diabetes mellitus with unspecified diabetic retinopathy without macular edema: Secondary | ICD-10-CM

## 2021-03-24 MED ORDER — REPAGLINIDE 2 MG PO TABS: ORAL_TABLET | ORAL | 1 refills | Status: DC

## 2021-03-24 MED ORDER — DAPAGLIFLOZIN PROPANEDIOL 10 MG PO TABS: 10.0000 mg | ORAL_TABLET | Freq: Every day | ORAL | 3 refills | Status: DC

## 2021-03-24 MED ORDER — METFORMIN HCL ER 500 MG PO TB24: ORAL_TABLET | ORAL | 3 refills | Status: DC

## 2021-03-27 ENCOUNTER — Telehealth: Payer: Self-pay | Admitting: Endocrinology

## 2021-03-27 DIAGNOSIS — E1165 Type 2 diabetes mellitus with hyperglycemia: Secondary | ICD-10-CM

## 2021-03-27 MED ORDER — FREESTYLE LIBRE 2 SENSOR MISC: 2.0000 | 3 refills | Status: DC

## 2021-03-27 NOTE — Telephone Encounter (Signed)
Rx sent to preferred pharmacy.

## 2021-03-27 NOTE — Telephone Encounter (Signed)
MEDICATION:  Continuous Blood Gluc Sensor (FREESTYLE LIBRE 2 SENSOR) MISC PHARMACY: No using Mail Service  HAS THE PATIENT CONTACTED THEIR PHARMACY?  NO  IS THIS A 90 DAY SUPPLY : 30 days  IS PATIENT OUT OF MEDICATION: 1 left  IF NOT; HOW MUCH IS LEFT:   LAST APPOINTMENT DATE: @12 /19/2022  NEXT APPOINTMENT DATE:@2 /14/2023  DO WE HAVE YOUR PERMISSION TO LEAVE A DETAILED MESSAGE?:  OTHER COMMENTS: 2 of the sensors were bad and did not work. PT will be out by 05/06/21. Give PT if any questions [phone].   **Let patient know to contact pharmacy at the end of the day to make sure medication is ready. **  ** Please notify patient to allow 48-72 hours to process**  **Encourage patient to contact the pharmacy for refills or they can request refills through [facility]**

## 2021-03-27 NOTE — Addendum Note (Signed)
Addended by: Eliseo Squires on: 03/27/2021 03:36 PM   Modules accepted: Orders

## 2021-04-09 ENCOUNTER — Other Ambulatory Visit: Payer: Self-pay

## 2021-04-09 DIAGNOSIS — E1165 Type 2 diabetes mellitus with hyperglycemia: Secondary | ICD-10-CM

## 2021-04-09 DIAGNOSIS — E7800 Pure hypercholesterolemia, unspecified: Secondary | ICD-10-CM

## 2021-04-09 MED ORDER — ATORVASTATIN CALCIUM 80 MG PO TABS: ORAL_TABLET | ORAL | 3 refills | Status: DC

## 2021-04-09 MED ORDER — FREESTYLE LIBRE 2 SENSOR MISC: 2.0000 | 3 refills | Status: DC

## 2021-04-09 NOTE — Telephone Encounter (Signed)
Spoke with patients mother who verbally understood [facility] will fill a one time prescription for requested Rx. PA pending will inform mother of status as soon as they are available.

## 2021-04-11 ENCOUNTER — Telehealth: Payer: Self-pay | Admitting: Pharmacy Technician

## 2021-04-11 ENCOUNTER — Other Ambulatory Visit (HOSPITAL_COMMUNITY): Payer: Self-pay

## 2021-04-11 NOTE — Telephone Encounter (Signed)
Patient Advocate Encounter   Received notification from [facility] that prior authorization for Marie [name] 2  is required by his/her insurance [employer].  Per Test Claim: Not covered under pt's part D plan.  Please contact one of these 2 companies to have filled under pt's Medicare B.  [facility] [phone] OR [facility]) [phone]

## 2021-04-16 ENCOUNTER — Other Ambulatory Visit: Payer: Self-pay

## 2021-04-16 ENCOUNTER — Telehealth: Payer: Self-pay | Admitting: Endocrinology

## 2021-04-16 ENCOUNTER — Telehealth: Payer: Self-pay | Admitting: Family Medicine

## 2021-04-16 NOTE — Telephone Encounter (Signed)
Patient return call and request a call back at [phone] regarding prescription refill on [facility].

## 2021-04-16 NOTE — Telephone Encounter (Signed)
Pt called concerned that she cannot get in touch with Dr Lucianne Muss for her diabetes medicine .. wonders if there is anything we can do .

## 2021-04-18 ENCOUNTER — Other Ambulatory Visit: Payer: Self-pay

## 2021-04-18 DIAGNOSIS — E1165 Type 2 diabetes mellitus with hyperglycemia: Secondary | ICD-10-CM

## 2021-04-18 MED ORDER — FREESTYLE LIBRE 2 SENSOR MISC: 2.0000 | 3 refills | Status: DC

## 2021-04-18 NOTE — Telephone Encounter (Signed)
Spoke with patient who states that she have spoken with Dr. Remus Blake office and they have refilled her medication.

## 2021-04-18 NOTE — Telephone Encounter (Signed)
Spoke with patient and informed her that Rx was e-scribed to [facility].

## 2021-04-19 ENCOUNTER — Other Ambulatory Visit: Payer: Self-pay | Admitting: Endocrinology

## 2021-04-19 DIAGNOSIS — E1165 Type 2 diabetes mellitus with hyperglycemia: Secondary | ICD-10-CM

## 2021-04-22 ENCOUNTER — Telehealth: Payer: Self-pay | Admitting: Family Medicine

## 2021-04-22 DIAGNOSIS — E119 Type 2 diabetes mellitus without complications: Secondary | ICD-10-CM | POA: Diagnosis not present

## 2021-04-30 NOTE — Telephone Encounter (Signed)
Disregard - this is noted in wrong acct. Annette Stable is for pts son.

## 2021-05-05 ENCOUNTER — Telehealth: Payer: Self-pay | Admitting: Family Medicine

## 2021-05-05 NOTE — Telephone Encounter (Signed)
I attempted to leave  message for patient to call back and schedule Medicare Annual Wellness Visit (AWV) in office. No voice mail.  If not able to come in office, please offer to do virtually or by telephone.  Left office number and my jabber #[phone].  Due for AWVI   Please schedule at anytime with Nurse Health Advisor.   

## 2021-05-14 ENCOUNTER — Telehealth: Payer: Self-pay | Admitting: Family Medicine

## 2021-05-14 NOTE — Telephone Encounter (Signed)
Pt called and wanted to see if she can get a copy of her cologaurd test results. Call back [phone]

## 2021-05-16 ENCOUNTER — Other Ambulatory Visit: Payer: Self-pay

## 2021-05-16 ENCOUNTER — Other Ambulatory Visit: Payer: Self-pay | Admitting: Family Medicine

## 2021-05-16 NOTE — Telephone Encounter (Signed)
Pharmacy  [facility] Phone# [phone] Fax# [phone]

## 2021-05-16 NOTE — Telephone Encounter (Signed)
Patient calling for refill on pending Rx this medication was discontinued looks like Dr. Hughie Closs started patient on Valsartan and discontinued lisinopril per patient she never started on Valsartan because she was told that she would have to go in for labs every 2 weeks. Patient continued on Lisinopril and would like a refill. Please advise

## 2021-05-16 NOTE — Telephone Encounter (Signed)
Results printed patient aware ready for pick up.

## 2021-05-16 NOTE — Telephone Encounter (Signed)
What is the name of the medication? lisinopril (ZESTRIL) 40 MG tablet [[id number]]   Have you contacted your pharmacy to request a refill? Yes, she is needing a refill  Which pharmacy would you like this sent to?    Patient notified that their request is being sent to the clinical staff for review and that they should receive a call once it is complete. If they do not receive a call within 72 hours they can check with their pharmacy or our office.

## 2021-05-20 ENCOUNTER — Other Ambulatory Visit: Payer: Self-pay

## 2021-05-20 ENCOUNTER — Other Ambulatory Visit: Payer: Medicare Other

## 2021-05-20 ENCOUNTER — Other Ambulatory Visit (INDEPENDENT_AMBULATORY_CARE_PROVIDER_SITE_OTHER): Payer: Medicare HMO

## 2021-05-20 DIAGNOSIS — E1165 Type 2 diabetes mellitus with hyperglycemia: Secondary | ICD-10-CM

## 2021-05-20 DIAGNOSIS — Z0000 Encounter for general adult medical examination without abnormal findings: Secondary | ICD-10-CM

## 2021-05-20 DIAGNOSIS — I10 Essential (primary) hypertension: Secondary | ICD-10-CM

## 2021-05-20 LAB — HEMOGLOBIN A1C: Hgb A1c MFr Bld: 7.1 % — ABNORMAL HIGH (ref 4.6–6.5)

## 2021-05-20 MED ORDER — LISINOPRIL 40 MG PO TABS: 40.0000 mg | ORAL_TABLET | Freq: Every day | ORAL | 0 refills | Status: DC

## 2021-05-20 NOTE — Telephone Encounter (Signed)
Rx sent- patient aware.  

## 2021-05-20 NOTE — Progress Notes (Signed)
per orders of Dr. Doreene Burke, pt is her for lab work. Pt tolerated draw well.

## 2021-05-21 LAB — BASIC METABOLIC PANEL
BUN: 12 mg/dL (ref 6–23)
CO2: 30 mEq/L (ref 19–32)
Calcium: 9 mg/dL (ref 8.4–10.5)
Chloride: 103 mEq/L (ref 96–112)
Creatinine, Ser: 0.76 mg/dL (ref 0.40–1.20)
GFR: 81.33 mL/min (ref >60.00)
Glucose, Bld: 115 mg/dL — ABNORMAL HIGH (ref 70–99)
Potassium: 4.1 mEq/L (ref 3.5–5.1)
Sodium: 141 mEq/L (ref 135–145)

## 2021-05-22 ENCOUNTER — Encounter: Payer: Self-pay | Admitting: Endocrinology

## 2021-05-22 ENCOUNTER — Ambulatory Visit (INDEPENDENT_AMBULATORY_CARE_PROVIDER_SITE_OTHER): Payer: Medicare HMO | Admitting: Endocrinology

## 2021-05-22 ENCOUNTER — Other Ambulatory Visit: Payer: Self-pay

## 2021-05-22 VITALS — BP 122/88 | HR 113 | Ht 65.0 in | Wt 134.8 lb

## 2021-05-22 DIAGNOSIS — E1165 Type 2 diabetes mellitus with hyperglycemia: Secondary | ICD-10-CM | POA: Diagnosis not present

## 2021-05-22 NOTE — Progress Notes (Signed)
Patient ID: Marie [name], female   DOB: May 10, 1955, 66 y.o.   MRN: [id number]           Reason for Appointment: Follow-up for Type 2 Diabetes   History of Present Illness:          Date of diagnosis of type 2 diabetes mellitus: 2010       Background history:   She was likely treated with Metformin alone for the first few years and subsequently given Januvia In 2018 she was also given [place] in addition Current records indicate her highest A1c was 9.6 in 03/2017.  Has been progressively improving since then She was also started on Rybelsus in 02/2019  Recent history:   A1c is 7.1, was 7.6  Non-insulin hypoglycemic drugs the patient is taking are: Metformin 1.0 g daily, Farxiga 10 mg at night, Prandin 2.0 mg before breakfast and 1 mg before dinner   Current management, blood sugar patterns and problems identified  Her blood sugars previously were tending to be high after breakfast, sometimes low during the night or mid afternoon  She was told to take her Prandin at least 20 minutes before breakfast and take 2 tablets together as well as have enough protein at breakfast However she is sometimes still having significantly high readings after breakfast when she is eating 2 slices of toast even with some protein like eggs and cheese On the other days blood sugars do not appear to be going up excessively  She has also been advised to leave off Prandin if not eating much carbohydrate lunch but still may get low normal readings in the afternoon at times  Blood sugars are mostly controlled after dinner and overnight now without going unusually high or low  been relatively better controlled since her last visit Not clear why she is taking her Farxiga at bedtime instead of in the morning She has been regular with her exercise, using home equipment Continues  to use the freestyle libre sensor         Side effects from medications have been: Decreased appetite from Rybelsus   Typical meal  intake: Breakfast is egg, toast .              Exercise:  Exercise equipment, ? elliptical, using for 22  min daily   Glucose monitoring:  Using freestyle libre   Data for the last 2 weeks   CGM use % of time 70  2-week average/GV 136  Time in range      84 %  % Time Above 180 11+4  % Time above 250   % Time Below 70 1     PRE-MEAL Fasting Lunch Dinner Bedtime Overall  Glucose range:       Averages: 118    131   POST-MEAL PC Breakfast PC Lunch PC Dinner  Glucose range:     Averages: 165 123 143    Dietician visit, most recent: 11/14  Weight history:   Wt Readings from Last 3 Encounters:  05/22/21 134 lb 12.8 oz (61.1 kg)  02/17/21 134 lb 6.4 oz (61 kg)  02/12/21 135 lb 12.8 oz (61.6 kg)    Glycemic control:   Lab Results  Component Value Date   HGBA1C 7.1 (H) 05/20/2021   HGBA1C 7.6 (H) 12/13/2020   HGBA1C 7.4 (H) 10/25/2020   Lab Results  Component Value Date   MICROALBUR 0.8 12/13/2020   LDLCALC 68 10/25/2020   CREATININE 0.76 05/20/2021   Lab Results  Component  Value Date   MICRALBCREAT 1.2 12/13/2020    Lab Results  Component Value Date   FRUCTOSAMINE 262 02/14/2021   FRUCTOSAMINE 260 10/25/2020    Lab on 05/20/2021  Component Date Value Ref Range Status   Sodium 05/20/2021 141  135 - 145 mEq/L Final   Potassium 05/20/2021 4.1  3.5 - 5.1 mEq/L Final   Chloride 05/20/2021 103  96 - 112 mEq/L Final   CO2 05/20/2021 30  19 - 32 mEq/L Final   Glucose, Bld 05/20/2021 115 (H)  70 - 99 mg/dL Final   BUN 05/20/2021 12  6 - 23 mg/dL Final   Creatinine, Ser 05/20/2021 0.76  0.40 - 1.20 mg/dL Final   GFR 05/20/2021 81.33  >60.00 mL/min Final   Calculated using the CKD-EPI Creatinine Equation (2021)   Calcium 05/20/2021 9.0  8.4 - 10.5 mg/dL Final   Hgb A1c MFr Bld 05/20/2021 7.1 (H)  4.6 - 6.5 % Final   Glycemic Control Guidelines for People with Diabetes:Non Diabetic:  <6%Goal of Therapy: <7%Additional Action Suggested:  >8%     Allergies as of  05/22/2021       Reactions   Penicillins Other (See Comments)   Driving couldn't see and head felt heavy  Has patient had a PCN reaction causing immediate rash, facial/tongue/throat swelling, SOB or lightheadedness with hypotension: No Has patient had a PCN reaction causing severe rash involving mucus membranes or skin necrosis: No Has patient had a PCN reaction that required hospitalization: No Has patient had a PCN reaction occurring within the last 10 years: No If all of the above answers are "NO", then may proceed with Cephalosporin use.        Medication List        Accurate as of May 22, 2021  4:13 PM. If you have any questions, ask your nurse or doctor.          aspirin EC 81 MG tablet Take 81 mg by mouth at bedtime.   atorvastatin 80 MG tablet Commonly known as: LIPITOR TAKE 1 TABLET(80 MG) BY MOUTH DAILY   dapagliflozin propanediol 10 MG Tabs tablet Commonly known as: Farxiga Take 1 tablet (10 mg total) by mouth daily.   FreeStyle Libre 2 Sensor Misc 2 Devices by Does not apply route every 14 (fourteen) days.   glucose blood test strip 1 each by Other route as needed for other. Use as instructed   lisinopril 40 MG tablet Commonly known as: ZESTRIL Take 1 tablet (40 mg total) by mouth daily.   metFORMIN 500 MG 24 hr tablet Commonly known as: GLUCOPHAGE-XR TAKE 2 TABLETS(1000 MG) BY MOUTH IN THE MORNING AND AT BEDTIME   multivitamin with minerals Tabs tablet Take 1 tablet by mouth daily.   repaglinide 2 MG tablet Commonly known as: PRANDIN TAKE 2 TABLETS BY MOUTH  BEFORE BREAKFAST , 1 TABLET AT LUNCH AND 1 TABLET  BEFORE DINNER   terconazole 0.4 % vaginal cream Commonly known as: TERAZOL 7 Place 1 applicator vaginally at bedtime. Use for seven days   valsartan 80 MG tablet Commonly known as: Diovan Take 1 tablet (80 mg total) by mouth daily.   Vitamin D3 75 MCG (3000 UT) Tabs Take 3,000 Units by mouth at bedtime.        Allergies:   Allergies  Allergen Reactions   Penicillins Other (See Comments)    Driving couldn't see and head felt heavy  Has patient had a PCN reaction causing immediate rash, facial/tongue/throat swelling, SOB or  lightheadedness with hypotension: No Has patient had a PCN reaction causing severe rash involving mucus membranes or skin necrosis: No Has patient had a PCN reaction that required hospitalization: No Has patient had a PCN reaction occurring within the last 10 years: No If all of the above answers are "NO", then may proceed with Cephalosporin use.     Past Medical History:  Diagnosis Date   CVA (cerebral vascular accident) ([place]) 2015   Depression    Diabetes ([place])    t2   Gallstones    Hyperlipemia    Hypertension    Migraines     Past Surgical History:  Procedure Laterality Date   BREAST BIOPSY Right 05/2019   CESAREAN SECTION     x2   CHOLECYSTECTOMY N/A 05/25/2016   Procedure: LAPAROSCOPIC CHOLECYSTECTOMY WITH INTRAOPERATIVE CHOLANGIOGRAM;  Surgeon: Johnathan Hausen, MD;  Location: [facility]S;  Service: General;  Laterality: N/A;   IR GENERIC HISTORICAL  03/13/2016   IR PERC CHOLECYSTOSTOMY 03/13/2016 Sandi Mariscal, MD [facility]INTER[facility] RAD   IR GENERIC HISTORICAL  04/28/2016   IR RADIOLOGIST EVAL & MGMT 04/28/2016 Jacqulynn Cadet, MD [facility] INTER[facility] RAD   LOOP RECORDER IMPLANT N/A 11/29/2013   Procedure: LOOP RECORDER IMPLANT;  Surgeon: Evans Lance, MD;  Location: [facility] CATH LAB;  Service: Cardiovascular;  Laterality: N/A;   TEE WITHOUT CARDIOVERSION N/A 08/24/2013   Procedure: TRANSESOPHAGEAL ECHOCARDIOGRAM (TEE);  Surgeon: Sanda Klein, MD;  Location: [facility] ENDOSCOPY;  Service: Cardiovascular;  Laterality: N/A;    Family History  Problem Relation Age of Onset   Stomach cancer Mother    High blood pressure Mother    Diabetes Mother    Diabetes Maternal Grandfather    High blood pressure Maternal Grandfather    Heart disease Neg Hx     Social History:  reports that she has never smoked.  She has never used smokeless tobacco. She reports that she does not drink alcohol and does not use drugs.   Review of Systems    Lipid history: LDL controlled with atorvastatin 80 mg. Followed by PCP   Lab Results  Component Value Date   CHOL 136 10/25/2020   HDL 49.20 10/25/2020   LDLCALC 68 10/25/2020   LDLDIRECT 136.0 09/24/2020   TRIG 94.0 10/25/2020   CHOLHDL 3 10/25/2020           Hypertension: Has been present and treated with lisinopril 40  Followed by PCP  BP Readings from Last 3 Encounters:  05/22/21 122/88  02/17/21 122/80  02/12/21 130/76    Most recent eye exam was in 11/21.  Has retinopathy  Most recent foot exam: 11/21  Currently known complications of diabetes: Retinopathy with decreased vision on the right, regularly followed by retinal surgery  Normal microalbumin in 7/21  History of CVA with left facial numbness and speech difficulty  LABS:  Lab on 05/20/2021  Component Date Value Ref Range Status   Sodium 05/20/2021 141  135 - 145 mEq/L Final   Potassium 05/20/2021 4.1  3.5 - 5.1 mEq/L Final   Chloride 05/20/2021 103  96 - 112 mEq/L Final   CO2 05/20/2021 30  19 - 32 mEq/L Final   Glucose, Bld 05/20/2021 115 (H)  70 - 99 mg/dL Final   BUN 05/20/2021 12  6 - 23 mg/dL Final   Creatinine, Ser 05/20/2021 0.76  0.40 - 1.20 mg/dL Final   GFR 05/20/2021 81.33  >60.00 mL/min Final   Calculated using the CKD-EPI Creatinine Equation (2021)   Calcium 05/20/2021 9.0  8.4 - 10.5 mg/dL Final   Hgb A1c MFr Bld 05/20/2021 7.1 (H)  4.6 - 6.5 % Final   Glycemic Control Guidelines for People with Diabetes:Non Diabetic:  <6%Goal of Therapy: <7%Additional Action Suggested:  >8%     Physical Examination:  BP 122/88    Pulse (!) 113    Ht 5\' 5"  (1.651 m)    Wt 134 lb 12.8 oz (61.1 kg)    SpO2 99%    BMI 22.43 kg/m      Exam not indicated     ASSESSMENT:  Diabetes type 2 nonobese  See history of present illness for detailed discussion of current  diabetes management, blood sugar patterns and problems identified  A1c is improved at 7.1  Current treatment regimen is Metformin 1000 mg, Farxiga 10 mg and Prandin 2 mg, 3 times daily  She has had tendency to high readings after breakfast but these are better now overall However when she is getting more carbohydrate like 2 slices of bread at breakfast she still has readings well over 200 postmeal Overnight blood sugars are not as low with stopping bedtime metformin She is doing fairly well with maintaining her weight and exercise  Her time in target on her CGM is still excellent at 84% on her last 2-week download    PLAN:    No change in medication except switch Farxiga to before breakfast instead of at bedtime She will try to check blood sugar consistently at different times and discussed targets after meals Have only 1 slice of bread at breakfast instead of 2 and may add more protein if needed    Patient Instructions  Check blood sugars on waking up 2-3 days a week  Also check blood sugars about 2 hours after meals and do this after different meals by rotation  Recommended blood sugar levels on waking up are 90-130 and about 2 hours after meal is 130-180  Please bring your blood sugar monitor to each visit, thank you  [place] with 2 Prandin before breakfast and no Rx at bedtime        Marie [name] 05/22/2021, 4:13 PM   Note: This office note was prepared with Dragon voice recognition system technology. Any transcriptional errors that result from this process are unintentional.

## 2021-05-22 NOTE — Patient Instructions (Addendum)
Check blood sugars on waking up 2-3 days a week  Also check blood sugars about 2 hours after meals and do this after different meals by rotation  Recommended blood sugar levels on waking up are 90-130 and about 2 hours after meal is 130-180  Please bring your blood sugar monitor to each visit, thank you  [place] with 2 Prandin before breakfast and no Rx at bedtime  Only 1 toast in am

## 2021-05-26 ENCOUNTER — Other Ambulatory Visit (HOSPITAL_COMMUNITY): Payer: Self-pay

## 2021-05-26 ENCOUNTER — Telehealth: Payer: Self-pay | Admitting: Pharmacy Technician

## 2021-05-26 NOTE — Telephone Encounter (Signed)
Tier exception was faxed to pt insurance

## 2021-05-27 ENCOUNTER — Encounter (INDEPENDENT_AMBULATORY_CARE_PROVIDER_SITE_OTHER): Payer: Self-pay | Admitting: Ophthalmology

## 2021-05-27 ENCOUNTER — Ambulatory Visit (INDEPENDENT_AMBULATORY_CARE_PROVIDER_SITE_OTHER): Payer: Medicare HMO | Admitting: Ophthalmology

## 2021-05-27 ENCOUNTER — Other Ambulatory Visit: Payer: Self-pay

## 2021-05-27 DIAGNOSIS — E113411 Type 2 diabetes mellitus with severe nonproliferative diabetic retinopathy with macular edema, right eye: Secondary | ICD-10-CM

## 2021-05-27 DIAGNOSIS — H2512 Age-related nuclear cataract, left eye: Secondary | ICD-10-CM | POA: Diagnosis not present

## 2021-05-27 DIAGNOSIS — E113492 Type 2 diabetes mellitus with severe nonproliferative diabetic retinopathy without macular edema, left eye: Secondary | ICD-10-CM | POA: Diagnosis not present

## 2021-05-27 NOTE — Assessment & Plan Note (Signed)
Stable OS overall, no sign of CSME observe

## 2021-05-27 NOTE — Progress Notes (Signed)
05/27/2021     CHIEF COMPLAINT Patient presents for  Chief Complaint  Patient presents with   Diabetic Retinopathy with Macular Edema      HISTORY OF PRESENT ILLNESS: Marie [name] is a 66 y.o. female who presents to the clinic today for:   HPI   Patient reports possible slight further improvement in the right eye visual acuity now some 4 months post injection of Ozurdex for diabetic CSME resistant to all forms of antivegF Last edited by Hurman Horn, MD on 05/27/2021  2:30 PM.      Referring physician: Libby Maw, MD [place],  [place] [id number]  HISTORICAL INFORMATION:   Selected notes from the MEDICAL RECORD NUMBER    Lab Results  Component Value Date   HGBA1C 7.1 (H) 05/20/2021     CURRENT MEDICATIONS: No current outpatient medications on file. (Ophthalmic Drugs)   No current facility-administered medications for this visit. (Ophthalmic Drugs)   Current Outpatient Medications (Other)  Medication Sig   aspirin EC 81 MG tablet Take 81 mg by mouth at bedtime.   atorvastatin (LIPITOR) 80 MG tablet TAKE 1 TABLET(80 MG) BY MOUTH DAILY   Cholecalciferol (VITAMIN D3) 3000 units TABS Take 3,000 Units by mouth at bedtime.   Continuous Blood Gluc Sensor (FREESTYLE LIBRE 2 SENSOR) MISC 2 Devices by Does not apply route every 14 (fourteen) days.   dapagliflozin propanediol (FARXIGA) 10 MG TABS tablet Take 1 tablet (10 mg total) by mouth daily.   glucose blood test strip 1 each by Other route as needed for other. Use as instructed (Patient not taking: Reported on 02/17/2021)   lisinopril (ZESTRIL) 40 MG tablet Take 1 tablet (40 mg total) by mouth daily.   metFORMIN (GLUCOPHAGE-XR) 500 MG 24 hr tablet TAKE 2 TABLETS(1000 MG) BY MOUTH IN THE MORNING AND AT BEDTIME   Multiple Vitamin (MULTIVITAMIN WITH MINERALS) TABS tablet Take 1 tablet by mouth daily.   repaglinide (PRANDIN) 2 MG tablet TAKE 2 TABLETS BY MOUTH  BEFORE BREAKFAST , 1 TABLET AT  LUNCH AND 1 TABLET  BEFORE DINNER   terconazole (TERAZOL 7) 0.4 % vaginal cream Place 1 applicator vaginally at bedtime. Use for seven days   valsartan (DIOVAN) 80 MG tablet Take 1 tablet (80 mg total) by mouth daily.   No current facility-administered medications for this visit. (Other)      REVIEW OF SYSTEMS: ROS   Negative for: Constitutional, Gastrointestinal, Neurological, Skin, Genitourinary, Musculoskeletal, HENT, Endocrine, Cardiovascular, Eyes, Respiratory, Psychiatric, Allergic/Imm, Heme/Lymph Last edited by Hurman Horn, MD on 05/27/2021  2:30 PM.       ALLERGIES Allergies  Allergen Reactions   Penicillins Other (See Comments)    Driving couldn't see and head felt heavy  Has patient had a PCN reaction causing immediate rash, facial/tongue/throat swelling, SOB or lightheadedness with hypotension: No Has patient had a PCN reaction causing severe rash involving mucus membranes or skin necrosis: No Has patient had a PCN reaction that required hospitalization: No Has patient had a PCN reaction occurring within the last 10 years: No If all of the above answers are "NO", then may proceed with Cephalosporin use.     PAST MEDICAL HISTORY Past Medical History:  Diagnosis Date   CVA (cerebral vascular accident) ([place]) 2015   Depression    Diabetes ([place])    t2   Gallstones    Hyperlipemia    Hypertension    Migraines    Past Surgical History:  Procedure  Laterality Date   BREAST BIOPSY Right 05/2019   CESAREAN SECTION     x2   CHOLECYSTECTOMY N/A 05/25/2016   Procedure: LAPAROSCOPIC CHOLECYSTECTOMY WITH INTRAOPERATIVE CHOLANGIOGRAM;  Surgeon: Johnathan Hausen, MD;  Location: [facility];  Service: General;  Laterality: N/A;   IR GENERIC HISTORICAL  03/13/2016   IR PERC CHOLECYSTOSTOMY 03/13/2016 Sandi Mariscal, MD [facility]   IR GENERIC HISTORICAL  04/28/2016   IR RADIOLOGIST EVAL & MGMT 04/28/2016 Jacqulynn Cadet, MD [facility]   LOOP RECORDER IMPLANT N/A 11/29/2013    Procedure: LOOP RECORDER IMPLANT;  Surgeon: Evans Lance, MD;  Location: [facility] CATH LAB;  Service: Cardiovascular;  Laterality: N/A;   TEE WITHOUT CARDIOVERSION N/A 08/24/2013   Procedure: TRANSESOPHAGEAL ECHOCARDIOGRAM (TEE);  Surgeon: Sanda Klein, MD;  Location[facility]OSCOPY;  Service: Cardiovascular;  Laterality: N/A;    FAMILY HISTORY Family History  Problem Relation Age of Onset   Stomach cancer Mother    High blood pressure Mother    Diabetes Mother    Diabetes Maternal Grandfather    High blood pressure Maternal Grandfather    Heart disease Neg Hx     SOCIAL HISTORY Social History   Tobacco Use   Smoking status: Never   Smokeless tobacco: Never  Vaping Use   Vaping Use: Never used  Substance Use Topics   Alcohol use: No    Alcohol/week: 1.0 standard drink    Types: 1 Standard drinks or equivalent per week    Comment: Social   Drug use: No         OPHTHALMIC EXAM:  Base Eye Exam     Visual Acuity (ETDRS)       Right Left   Dist [place] 20/150 +2 20/25 -2   Dist ph [place] 20/100          Tonometry (Tonopen, 2:27 PM)       Right Left   Pressure 18 12         Pupils       Pupils APD   Right PERRL None   Left PERRL None         Extraocular Movement       Right Left    Full, Ortho Full, Ortho         Neuro/Psych     Oriented x3: Yes   Mood/Affect: Normal         Dilation     Both eyes: 1.0% Mydriacyl, 2.5% Phenylephrine @ 2:26 PM           Slit Lamp and Fundus Exam     External Exam       Right Left   External Normal Normal         Slit Lamp Exam       Right Left   Lids/Lashes Normal Normal   Conjunctiva/Sclera White and quiet White and quiet   Cornea Clear Clear   Anterior Chamber Deep and quiet Deep and quiet   Iris Round and reactive Round and reactive   Lens Posterior chamber intraocular lens, , 2+ Posterior capsular opacification 2+ Nuclear sclerosis, Cortical cataract   Anterior Vitreous Normal Normal          Fundus Exam       Right Left   Posterior Vitreous Posterior vitreous detachment, Central vitreous floaters, Iluvien implant visualized in the inferotemporal quadrant after injection procedure completed Normal   Disc Normal Normal   C/D Ratio 0.5 0.5-0.6   Macula Focal laser scars, macular thickening, Microaneurysms,  much less, no discernible thickening Microaneurysms   Vessels Retinopathy,, severe NPDR NPDR-Severe   Periphery Normal, good prp, room 360, for more laser in the future, posteriorly, and nasally and inferonasally some room anteriorly as well Normal            IMAGING AND PROCEDURES  Imaging and Procedures for 05/27/21  OCT, Retina - OU - Both Eyes       Right Eye Quality was good. Scan locations included subfoveal. Central Foveal Thickness: 317. Findings include abnormal foveal contour, cystoid macular edema, epiretinal membrane.   Left Eye Quality was good. Scan locations included subfoveal. Central Foveal Thickness: 347. Findings include abnormal foveal contour.   Notes  Now  4 months status post injection intravitreal Iluvien for long-term steroid slow release OD for CSME continued vast improvement in CSME OD yet some lingering CME noted.  Mild ERM, center involved CME  OS stable overall, with micro CME not progressive             ASSESSMENT/PLAN:  Severe nonproliferative diabetic retinopathy of right eye, with macular edema, associated with type 2 diabetes mellitus ([place]) OD will complete as much PRP as possible within the 1 to 2 weeks coming, so as to decrease vegF burden once Ozurdex effects wears off  Have to monitor the intraocular pressure as it is not higher than preinjection but still a slight asymmetry between the 2 eyes  Severe nonproliferative diabetic retinopathy of left eye (HCC) Stable OS overall, no sign of CSME observe  Nuclear sclerotic cataract of left eye Not visually significant OS     ICD-10-CM   1. Severe nonproliferative  diabetic retinopathy of right eye, with macular edema, associated with type 2 diabetes mellitus (HCC)  E11.3411 OCT, Retina - OU - Both Eyes    2. Severe nonproliferative diabetic retinopathy of left eye without macular edema associated with type 2 diabetes mellitus (HCC)  [id number] OCT, Retina - OU - Both Eyes    3. Nuclear sclerotic cataract of left eye  H25.12       1.  OD we will schedule for PRP and color photos OU next  2.  Continue to monitor for macular edema recurrence as Ozurdex effects wanes, plan for Ozurdex and 5 -8 weeks likely  3.  Ophthalmic Meds Ordered this visit:  No orders of the defined types were placed in this encounter.      Return in about 1 week (around 06/03/2021) for dilate, OD, COLOR FP, PRP.  There are no Patient Instructions on file for this visit.   Explained the diagnoses, plan, and follow up with the patient and they expressed understanding.  Patient expressed understanding of the importance of proper follow up care.   Clent Demark Sreya Froio M.D. Diseases & Surgery of the Retina and Vitreous [facility] 05/27/21     Abbreviations: M myopia (nearsighted); A astigmatism; H hyperopia (farsighted); P presbyopia; Mrx spectacle prescription;  CTL contact lenses; OD right eye; OS left eye; OU both eyes  XT exotropia; ET esotropia; PEK punctate epithelial keratitis; PEE punctate epithelial erosions; DES dry eye syndrome; MGD meibomian gland dysfunction; ATs artificial tears; PFAT's preservative free artificial tears; [place] nuclear sclerotic cataract; PSC posterior subcapsular cataract; ERM epi-retinal membrane; PVD posterior vitreous detachment; RD retinal detachment; DM diabetes mellitus; DR diabetic retinopathy; NPDR non-proliferative diabetic retinopathy; PDR proliferative diabetic retinopathy; CSME clinically significant macular edema; DME diabetic macular edema; dbh dot blot hemorrhages; CWS cotton wool spot; POAG primary open angle glaucoma;  C/D cup-to-disc ratio; HVF humphrey visual field; GVF goldmann visual field; OCT optical coherence tomography; IOP intraocular pressure; BRVO Branch retinal vein occlusion; CRVO central retinal vein occlusion; CRAO central retinal artery occlusion; BRAO branch retinal artery occlusion; RT retinal tear; SB scleral buckle; PPV pars plana vitrectomy; VH Vitreous hemorrhage; PRP panretinal laser photocoagulation; IVK intravitreal kenalog; VMT vitreomacular traction; MH Macular hole;  NVD neovascularization of the disc; NVE neovascularization elsewhere; AREDS age related eye disease study; ARMD age related macular degeneration; POAG primary open angle glaucoma; EBMD epithelial/anterior basement membrane dystrophy; ACIOL anterior chamber intraocular lens; IOL intraocular lens; PCIOL posterior chamber intraocular lens; Phaco/IOL phacoemulsification with intraocular lens placement; [place] photorefractive keratectomy; LASIK laser assisted in situ keratomileusis; HTN hypertension; DM diabetes mellitus; COPD chronic obstructive pulmonary disease

## 2021-05-27 NOTE — Assessment & Plan Note (Signed)
Not visually significant OS

## 2021-05-27 NOTE — Assessment & Plan Note (Signed)
OD will complete as much PRP as possible within the 1 to 2 weeks coming, so as to decrease vegF burden once Ozurdex effects wears off  Have to monitor the intraocular pressure as it is not higher than preinjection but still a slight asymmetry between the 2 eyes

## 2021-06-05 ENCOUNTER — Other Ambulatory Visit: Payer: Self-pay

## 2021-06-05 ENCOUNTER — Ambulatory Visit (INDEPENDENT_AMBULATORY_CARE_PROVIDER_SITE_OTHER): Payer: Medicare HMO | Admitting: Ophthalmology

## 2021-06-05 ENCOUNTER — Encounter (INDEPENDENT_AMBULATORY_CARE_PROVIDER_SITE_OTHER): Payer: Self-pay | Admitting: Ophthalmology

## 2021-06-05 DIAGNOSIS — E113411 Type 2 diabetes mellitus with severe nonproliferative diabetic retinopathy with macular edema, right eye: Secondary | ICD-10-CM

## 2021-06-05 NOTE — Progress Notes (Signed)
06/05/2021     CHIEF COMPLAINT Patient presents for  Chief Complaint  Patient presents with   Diabetic Retinopathy with Macular Edema      HISTORY OF PRESENT ILLNESS: Marie [name] is a 66 y.o. female who presents to the clinic today for:   HPI   1 week dilate OD, FP, PRP OD. Patient states vision seems like it has improved since last visit a little bit. Denies any new floaters or FOL.  Last edited by Nelva NayKronstein, Anna N on 06/05/2021  2:36 PM.      Referring physician: Mliss SaxKremer, William Alfred, MD [address] [place],  [place] [id number]  HISTORICAL INFORMATION:   Selected notes from the MEDICAL RECORD NUMBER    Lab Results  Component Value Date   HGBA1C 7.1 (H) 05/20/2021     CURRENT MEDICATIONS: No current outpatient medications on file. (Ophthalmic Drugs)   No current facility-administered medications for this visit. (Ophthalmic Drugs)   Current Outpatient Medications (Other)  Medication Sig   aspirin EC 81 MG tablet Take 81 mg by mouth at bedtime.   atorvastatin (LIPITOR) 80 MG tablet TAKE 1 TABLET(80 MG) BY MOUTH DAILY   Cholecalciferol (VITAMIN D3) 3000 units TABS Take 3,000 Units by mouth at bedtime.   Continuous Blood Gluc Sensor (FREESTYLE LIBRE 2 SENSOR) MISC 2 Devices by Does not apply route every 14 (fourteen) days.   dapagliflozin propanediol (FARXIGA) 10 MG TABS tablet Take 1 tablet (10 mg total) by mouth daily.   glucose blood test strip 1 each by Other route as needed for other. Use as instructed (Patient not taking: Reported on 02/17/2021)   lisinopril (ZESTRIL) 40 MG tablet Take 1 tablet (40 mg total) by mouth daily.   metFORMIN (GLUCOPHAGE-XR) 500 MG 24 hr tablet TAKE 2 TABLETS(1000 MG) BY MOUTH IN THE MORNING AND AT BEDTIME   Multiple Vitamin (MULTIVITAMIN WITH MINERALS) TABS tablet Take 1 tablet by mouth daily.   repaglinide (PRANDIN) 2 MG tablet TAKE 2 TABLETS BY MOUTH  BEFORE BREAKFAST , 1 TABLET AT LUNCH AND 1 TABLET  BEFORE DINNER    terconazole (TERAZOL 7) 0.4 % vaginal cream Place 1 applicator vaginally at bedtime. Use for seven days   valsartan (DIOVAN) 80 MG tablet Take 1 tablet (80 mg total) by mouth daily.   No current facility-administered medications for this visit. (Other)      REVIEW OF SYSTEMS: ROS   Negative for: Constitutional, Gastrointestinal, Neurological, Skin, Genitourinary, Musculoskeletal, HENT, Endocrine, Cardiovascular, Eyes, Respiratory, Psychiatric, Allergic/Imm, Heme/Lymph Last edited by Edmon Crapeankin, Asmar Brozek A, MD on 06/05/2021  3:25 PM.       ALLERGIES Allergies  Allergen Reactions   Penicillins Other (See Comments)    Driving couldn't see and head felt heavy  Has patient had a PCN reaction causing immediate rash, facial/tongue/throat swelling, SOB or lightheadedness with hypotension: No Has patient had a PCN reaction causing severe rash involving mucus membranes or skin necrosis: No Has patient had a PCN reaction that required hospitalization: No Has patient had a PCN reaction occurring within the last 10 years: No If all of the above answers are "NO", then may proceed with Cephalosporin use.     PAST MEDICAL HISTORY Past Medical History:  Diagnosis Date   CVA (cerebral vascular accident) (HCC) 2015   Depression    Diabetes (HCC)    t2   Gallstones    Hyperlipemia    Hypertension    Migraines    Past Surgical History:  Procedure Laterality Date  BREAST BIOPSY Right 05/2019   CESAREAN SECTION     x2   CHOLECYSTECTOMY N/A 05/25/2016   Procedure: LAPAROSCOPIC CHOLECYSTECTOMY WITH INTRAOPERATIVE CHOLANGIOGRAM;  Surgeon: Luretha Murphy, MD;  Location: [facility];  Service: General;  Laterality: N/A;   IR GENERIC HISTORICAL  03/13/2016   IR PERC CHOLECYSTOSTOMY 03/13/2016 [name], MD [facility]   IR GENERIC HISTORICAL  04/28/2016   IR RADIOLOGIST EVAL & MGMT 04/28/2016 [name], MD [facility]   LOOP RECORDER IMPLANT N/A 11/29/2013   Procedure: LOOP RECORDER IMPLANT;   Surgeon: Marinus Maw, MD;  [facility] CATH LAB;  Service: Cardiovascular;  Laterality: N/A;   TEE WITHOUT CARDIOVERSION N/A 08/24/2013   Procedure: TRANSESOPHAGEAL ECHOCARDIOGRAM (TEE);  Surgeon: [name], MD;  Location: [facility] ENDOSCOPY;  Service: Cardiovascular;  Laterality: N/A;    FAMILY HISTORY Family History  Problem Relation Age of Onset   Stomach cancer Mother    High blood pressure Mother    Diabetes Mother    Diabetes Maternal Grandfather    High blood pressure Maternal Grandfather    Heart disease Neg Hx     SOCIAL HISTORY Social History   Tobacco Use   Smoking status: Never   Smokeless tobacco: Never  Vaping Use   Vaping Use: Never used  Substance Use Topics   Alcohol use: No    Alcohol/week: 1.0 standard drink    Types: 1 Standard drinks or equivalent per week    Comment: Social   Drug use: No         OPHTHALMIC EXAM:  Base Eye Exam     Visual Acuity (ETDRS)       Right Left   Dist cc 20/160 -1+1 20/20 -2   Dist ph cc 20/80 -2          Tonometry (Tonopen, 2:39 PM)       Right Left   Pressure 18 15         Pupils       Pupils Dark Light APD   Right PERRL 5 4 None   Left PERRL 5 4 None         Extraocular Movement       Right Left    Full Full         Neuro/Psych     Oriented x3: Yes   Mood/Affect: Normal         Dilation     Right eye: 1.0% Mydriacyl, 2.5% Phenylephrine @ 2:39 PM           Slit Lamp and Fundus Exam     External Exam       Right Left   External Normal Normal         Slit Lamp Exam       Right Left   Lids/Lashes Normal Normal   Conjunctiva/Sclera White and quiet White and quiet   Cornea Clear Clear   Anterior Chamber Deep and quiet Deep and quiet   Iris Round and reactive Round and reactive   Lens Posterior chamber intraocular lens, , 2+ Posterior capsular opacification 2+ Nuclear sclerosis, Cortical cataract   Anterior Vitreous Normal Normal         Fundus Exam        Right Left   Posterior Vitreous Posterior vitreous detachment, Central vitreous floaters, Iluvien implant visualized in the inferotemporal quadrant after injection procedure completed Normal   Disc Normal Normal   C/D Ratio 0.5 0.5-0.6   Macula Focal laser scars, macular thickening,  Microaneurysms, much less, no discernible thickening Microaneurysms   Vessels Retinopathy,, severe NPDR NPDR-Severe   Periphery Normal, good prp, room 360, for more laser in the future, posteriorly, and nasally and inferonasally some room anteriorly as well Normal            IMAGING AND PROCEDURES  Imaging and Procedures for 06/05/21  Panretinal Photocoagulation - OD - Right Eye       Time Out Confirmed correct patient, procedure, site, and patient consented.   Anesthesia Topical anesthesia was used. Anesthetic medications included Proparacaine 0.5%.   Laser Information The type of laser was diode. Color was yellow. The duration in seconds was 0.04. The spot size was 390 microns. Laser power was 320. Total spots was 502.   Post-op The patient tolerated the procedure well. There were no complications. The patient received written and verbal post procedure care education.   Notes  OD treatment delivered posteriorly to previous treatment but outside the macular region temporally     Color Fundus Photography Optos - OU - Both Eyes       Right Eye Progression has worsened. Disc findings include normal observations. Macula : edema, microaneurysms.   Left Eye Progression has been stable. Disc findings include normal observations. Macula : normal observations. Vessels : normal observations. Periphery : normal observations.   Notes Good PRP nearly 360, some small region inferonasal and superotemporal and anteriorly inferotemporal , available for more laser but clear media at this time with quiet PDR, decreased CSME overall, implant not seen   OS with severe NPDR              ASSESSMENT/PLAN:  Severe nonproliferative diabetic retinopathy of right eye, with macular edema, associated with type 2 diabetes mellitus (HCC) OD now some 6 months nearly post Ozurdex with CME from CSME NPDR.  Additional PRP delivered today to decrease vegF burden  Follow-up in 8 weeks to monitor response     ICD-10-CM   1. Severe nonproliferative diabetic retinopathy of right eye, with macular edema, associated with type 2 diabetes mellitus (HCC)  B09.6283 Panretinal Photocoagulation - OD - Right Eye    Color Fundus Photography Optos - OU - Both Eyes      1.  OD with severe NPDR and recalcitrant and progressive CSME.  Now responsive to Ozurdex currently at 5 months postinjection.  May need more treatment in the future.  2.  OD for decrease vegF burden today by additional PRP added posterior to previous treatment outside the macular region  3.  Ophthalmic Meds Ordered this visit:  No orders of the defined types were placed in this encounter.      Return in about 8 weeks (around 07/31/2021) for dilate, OD, OCT.  There are no Patient Instructions on file for this visit.   Explained the diagnoses, plan, and follow up with the patient and they expressed understanding.  Patient expressed understanding of the importance of proper follow up care.   Alford Highland Myrella Fahs M.D. Diseases & Surgery of the Retina and Vitreous [facility] 06/05/21     Abbreviations: M myopia (nearsighted); A astigmatism; H hyperopia (farsighted); P presbyopia; Mrx spectacle prescription;  CTL contact lenses; OD right eye; OS left eye; OU both eyes  XT exotropia; ET esotropia; PEK punctate epithelial keratitis; PEE punctate epithelial erosions; DES dry eye syndrome; MGD meibomian gland dysfunction; ATs artificial tears; PFAT's preservative free artificial tears; NSC nuclear sclerotic cataract; PSC posterior subcapsular cataract; ERM epi-retinal membrane; PVD posterior vitreous  detachment; RD  retinal detachment; DM diabetes mellitus; DR diabetic retinopathy; NPDR non-proliferative diabetic retinopathy; PDR proliferative diabetic retinopathy; CSME clinically significant macular edema; DME diabetic macular edema; dbh dot blot hemorrhages; CWS cotton wool spot; POAG primary open angle glaucoma; C/D cup-to-disc ratio; HVF humphrey visual field; GVF goldmann visual field; OCT optical coherence tomography; IOP intraocular pressure; BRVO Branch retinal vein occlusion; CRVO central retinal vein occlusion; CRAO central retinal artery occlusion; BRAO branch retinal artery occlusion; RT retinal tear; SB scleral buckle; PPV pars plana vitrectomy; VH Vitreous hemorrhage; PRP panretinal laser photocoagulation; IVK intravitreal kenalog; VMT vitreomacular traction; MH Macular hole;  NVD neovascularization of the disc; NVE neovascularization elsewhere; AREDS age related eye disease study; ARMD age related macular degeneration; POAG primary open angle glaucoma; EBMD epithelial/anterior basement membrane dystrophy; ACIOL anterior chamber intraocular lens; IOL intraocular lens; PCIOL posterior chamber intraocular lens; Phaco/IOL phacoemulsification with intraocular lens placement; PRK photorefractive keratectomy; LASIK laser assisted in situ keratomileusis; HTN hypertension; DM diabetes mellitus; COPD chronic obstructive pulmonary disease

## 2021-06-05 NOTE — Assessment & Plan Note (Signed)
OD now some 6 months nearly post Ozurdex with CME from CSME NPDR.  Additional PRP delivered today to decrease vegF burden ? ?Follow-up in 8 weeks to monitor response ?

## 2021-06-06 ENCOUNTER — Other Ambulatory Visit (HOSPITAL_COMMUNITY): Payer: Self-pay

## 2021-06-06 NOTE — Telephone Encounter (Signed)
Patient Advocate Encounter ? ?Received notification from [facility] that the request for the tier prior authorization for Marcelline Deist has been denied because their are 2 meds. in a lower tier.  Switch to either Invokana or Jardiance. ?  ? ?Specialty Pharmacy Patient Advocate ?Fax: [phone]  ?

## 2021-06-06 NOTE — Telephone Encounter (Signed)
I am trying to see any other notes regarding the [place] and what the price was before. Test bill return a copay of$95. No generic available.

## 2021-06-06 NOTE — Telephone Encounter (Signed)
Any update? Patient is running low on medication. ?

## 2021-06-09 ENCOUNTER — Other Ambulatory Visit: Payer: Self-pay | Admitting: Endocrinology

## 2021-06-09 MED ORDER — EMPAGLIFLOZIN 25 MG PO TABS: 25.0000 mg | ORAL_TABLET | Freq: Every day | ORAL | 1 refills | Status: DC

## 2021-06-10 ENCOUNTER — Ambulatory Visit (INDEPENDENT_AMBULATORY_CARE_PROVIDER_SITE_OTHER): Payer: Medicare HMO | Admitting: Family Medicine

## 2021-06-10 ENCOUNTER — Encounter: Payer: Self-pay | Admitting: Family Medicine

## 2021-06-10 ENCOUNTER — Other Ambulatory Visit: Payer: Self-pay

## 2021-06-10 VITALS — BP 138/72 | HR 96 | Temp 97.4°F | Ht 65.0 in | Wt 137.8 lb

## 2021-06-10 DIAGNOSIS — I10 Essential (primary) hypertension: Secondary | ICD-10-CM | POA: Diagnosis not present

## 2021-06-10 DIAGNOSIS — E7800 Pure hypercholesterolemia, unspecified: Secondary | ICD-10-CM

## 2021-06-10 NOTE — Progress Notes (Signed)
Established Patient Office Visit  Subjective:  Patient ID: Marie [name], female    DOB: 1955-10-22  Age: 66 y.o. MRN: [id number]  CC:  Chief Complaint  Patient presents with   Follow-up    Follow up on BP and meds, patient taking lisinopril instead of Valsartan.      HPI Marie [name] presents for follow-up of hypertension and med check.  Some confusion about whether or not she was taking valsartan with lisinopril.  She is not taking valsartan and is only taking 40 mg of lisinopril.  Blood pressure is well controlled at this time.  Hemoglobin A1c is a reasonable.  With continuous glucose monitoring she does experience lows.  Admits there are times when she simply just does not eat.  Continues with high-dose atorvastatin without issue.  Past Medical History:  Diagnosis Date   CVA (cerebral vascular accident) (HCC) 2015   Depression    Diabetes (HCC)    t2   Gallstones    Hyperlipemia    Hypertension    Migraines     Past Surgical History:  Procedure Laterality Date   BREAST BIOPSY Right 05/2019   CESAREAN SECTION     x2   CHOLECYSTECTOMY N/A 05/25/2016   Procedure: LAPAROSCOPIC CHOLECYSTECTOMY WITH INTRAOPERATIVE CHOLANGIOGRAM;  Surgeon: Marie Murphy, MD;  Location: [facility];  Service: General;  Laterality: N/A;   IR GENERIC HISTORICAL  03/13/2016   IR PERC CHOLECYSTOSTOMY 03/13/2016 Marie Come, MD [facility]   IR GENERIC HISTORICAL  04/28/2016   IR RADIOLOGIST EVAL & MGMT 04/28/2016 Marie Moan, MD [facility]   LOOP RECORDER IMPLANT N/A 11/29/2013   Procedure: LOOP RECORDER IMPLANT;  Surgeon: Marie Maw, MD;  Location: [facility] CATH LAB;  Service: Cardiovascular;  Laterality: N/A;   TEE WITHOUT CARDIOVERSION N/A 08/24/2013   Procedure: TRANSESOPHAGEAL ECHOCARDIOGRAM (TEE);  Surgeon: Marie Fair, MD;  Location[facility] ENDOSCOPY;  Service: Cardiovascular;  Laterality: N/A;    Family History  Problem Relation Age of Onset   Stomach cancer Mother    High blood  pressure Mother    Diabetes Mother    Diabetes Maternal Grandfather    High blood pressure Maternal Grandfather    Heart disease Neg Hx     Social History   Socioeconomic History   Marital status: Married    Spouse name: Not on file   Number of children: 3   Years of education: MBA   Highest education level: Not on file  Occupational History    Employer: [employer]    Comment: [employer]  Tobacco Use   Smoking status: Never   Smokeless tobacco: Never  Vaping Use   Vaping Use: Never used  Substance and Sexual Activity   Alcohol use: No    Alcohol/week: 1.0 standard drink    Types: 1 Standard drinks or equivalent per week    Comment: Social   Drug use: No   Sexual activity: Not on file  Other Topics Concern   Not on file  Social History Narrative   Patient lives at home with [name].   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         Social Determinants of Health   Financial Resource Strain: Not on file  Food Insecurity: Not on file  Transportation Needs: Not on file  Physical Activity: Not on file  Stress: Not on file  Social Connections: Not on file  Intimate Partner Violence: Not on file    Outpatient Medications Prior to Visit  Medication Sig Dispense Refill   aspirin EC 81 MG tablet Take 81 mg by mouth at bedtime.     atorvastatin (LIPITOR) 80 MG tablet TAKE 1 TABLET(80 MG) BY MOUTH DAILY 90 tablet 3   Cholecalciferol (VITAMIN D3) 3000 units TABS Take 3,000 Units by mouth at bedtime.     Continuous Blood Gluc Sensor (FREESTYLE LIBRE 2 SENSOR) MISC 2 Devices by Does not apply route every 14 (fourteen) days. 6 each 3   lisinopril (ZESTRIL) 40 MG tablet Take 1 tablet (40 mg total) by mouth daily. 90 tablet 0   metFORMIN (GLUCOPHAGE-XR) 500 MG 24 hr tablet TAKE 2 TABLETS(1000 MG) BY MOUTH IN THE MORNING AND AT BEDTIME 360 tablet 3   Multiple Vitamin (MULTIVITAMIN WITH  MINERALS) TABS tablet Take 1 tablet by mouth daily.     repaglinide (PRANDIN) 2 MG tablet TAKE 2 TABLETS BY MOUTH  BEFORE BREAKFAST , 1 TABLET AT LUNCH AND 1 TABLET  BEFORE DINNER 360 tablet 3   terconazole (TERAZOL 7) 0.4 % vaginal cream Place 1 applicator vaginally at bedtime. Use for seven days 45 g 0   glucose blood test strip 1 each by Other route as needed for other. Use as instructed (Patient not taking: Reported on 02/17/2021)     empagliflozin (JARDIANCE) 25 MG TABS tablet Take 1 tablet (25 mg total) by mouth daily before breakfast. 90 tablet 1   valsartan (DIOVAN) 80 MG tablet Take 1 tablet (80 mg total) by mouth daily. (Patient not taking: Reported on 06/10/2021) 90 tablet 3   No facility-administered medications prior to visit.    Allergies  Allergen Reactions   Penicillins Other (See Comments)    Driving couldn't see and head felt heavy  Has patient had a PCN reaction causing immediate rash, facial/tongue/throat swelling, SOB or lightheadedness with hypotension: No Has patient had a PCN reaction causing severe rash involving mucus membranes or skin necrosis: No Has patient had a PCN reaction that required hospitalization: No Has patient had a PCN reaction occurring within the last 10 years: No If all of the above answers are "NO", then may proceed with Cephalosporin use.     ROS Review of Systems  Constitutional: Negative.   Respiratory: Negative.    Cardiovascular: Negative.   Gastrointestinal: Negative.   Endocrine: Negative for polyphagia and polyuria.  Neurological: Negative.   Psychiatric/Behavioral: Negative.       Objective:    Physical Exam Vitals and nursing note reviewed.  Constitutional:      General: She is not in acute distress.    Appearance: Normal appearance. She is not ill-appearing, toxic-appearing or diaphoretic.  HENT:     Head: Normocephalic and atraumatic.     Right Ear: External ear normal.     Left Ear: External ear normal.      Mouth/Throat:     Mouth: Mucous membranes are moist.     Pharynx: Oropharynx is clear. No oropharyngeal exudate or posterior oropharyngeal erythema.  Eyes:     General: No scleral icterus.       Right eye: No discharge.        Left eye: No discharge.     Extraocular Movements: Extraocular movements intact.     Conjunctiva/sclera: Conjunctivae normal.     Pupils: Pupils are equal, round, and reactive to light.  Neck:     Vascular: No carotid bruit.  Cardiovascular:  Rate and Rhythm: Normal rate and regular rhythm.  Pulmonary:     Effort: Pulmonary effort is normal.     Breath sounds: Normal breath sounds.  Abdominal:     General: Abdomen is flat. Bowel sounds are normal. There is no distension.     Palpations: Abdomen is soft. There is no mass.     Tenderness: There is no abdominal tenderness. There is no guarding or rebound.     Hernia: No hernia is present.  Musculoskeletal:     Cervical back: No rigidity or tenderness.  Lymphadenopathy:     Cervical: No cervical adenopathy.  Skin:    General: Skin is warm and dry.  Neurological:     Mental Status: She is alert and oriented to person, place, and time.  Psychiatric:        Mood and Affect: Mood normal.        Behavior: Behavior normal.    BP 138/72 (BP Location: Left Arm, Patient Position: Sitting, Cuff Size: Normal)    Pulse 96    Temp (!) 97.4 F (36.3 C) (Temporal)    Ht 5\' 5"  (1.651 m)    Wt 137 lb 12.8 oz (62.5 kg)    SpO2 96%    BMI 22.93 kg/m  Wt Readings from Last 3 Encounters:  06/10/21 137 lb 12.8 oz (62.5 kg)  05/22/21 134 lb 12.8 oz (61.1 kg)  02/17/21 134 lb 6.4 oz (61 kg)     Health Maintenance Due  Topic Date Due   DEXA SCAN  Never done   Pneumonia Vaccine 54+ Years old (2 - PCV) 07/04/2021    There are no preventive care reminders to display for this patient.  Lab Results  Component Value Date   TSH 2.42 10/13/2018   Lab Results  Component Value Date   WBC 5.7 09/24/2020   HGB 13.8  09/24/2020   HCT 41.4 09/24/2020   MCV 85.6 09/24/2020   PLT 211.0 09/24/2020   Lab Results  Component Value Date   NA 141 05/20/2021   K 4.1 05/20/2021   CO2 30 05/20/2021   GLUCOSE 115 (H) 05/20/2021   BUN 12 05/20/2021   CREATININE 0.76 05/20/2021   BILITOT 0.5 09/24/2020   ALKPHOS 70 09/24/2020   AST 17 09/24/2020   ALT 24 09/24/2020   PROT 7.1 09/24/2020   ALBUMIN 4.4 09/24/2020   CALCIUM 9.0 05/20/2021   ANIONGAP 11 05/21/2016   GFR 81.33 05/20/2021   Lab Results  Component Value Date   CHOL 136 10/25/2020   Lab Results  Component Value Date   HDL 49.20 10/25/2020   Lab Results  Component Value Date   LDLCALC 68 10/25/2020   Lab Results  Component Value Date   TRIG 94.0 10/25/2020   Lab Results  Component Value Date   CHOLHDL 3 10/25/2020   Lab Results  Component Value Date   HGBA1C 7.1 (H) 05/20/2021      Assessment & Plan:   Problem List Items Addressed This Visit       Cardiovascular and Mediastinum   Essential hypertension     Other   Elevated LDL cholesterol level - Primary   Relevant Orders   LDL cholesterol, direct    No orders of the defined types were placed in this encounter.   Follow-up: Return in about 6 months (around 12/11/2021).   Continue lisinopril and high-dose atorvastatin.  Blood pressure controlled on lisinopril alone.  Rechecking LDL cholesterol today. Mliss Sax, MD

## 2021-06-11 ENCOUNTER — Other Ambulatory Visit (INDEPENDENT_AMBULATORY_CARE_PROVIDER_SITE_OTHER): Payer: Medicare HMO

## 2021-06-11 DIAGNOSIS — E1165 Type 2 diabetes mellitus with hyperglycemia: Secondary | ICD-10-CM | POA: Diagnosis not present

## 2021-06-11 LAB — BASIC METABOLIC PANEL
BUN: 14 mg/dL (ref 6–23)
CO2: 31 mEq/L (ref 19–32)
Calcium: 9.5 mg/dL (ref 8.4–10.5)
Chloride: 104 mEq/L (ref 96–112)
Creatinine, Ser: 0.77 mg/dL (ref 0.40–1.20)
GFR: 80.03 mL/min (ref >60.00)
Glucose, Bld: 118 mg/dL — ABNORMAL HIGH (ref 70–99)
Potassium: 4.9 mEq/L (ref 3.5–5.1)
Sodium: 143 mEq/L (ref 135–145)

## 2021-06-11 LAB — HEMOGLOBIN A1C: Hgb A1c MFr Bld: 7.3 % — ABNORMAL HIGH (ref 4.6–6.5)

## 2021-07-04 ENCOUNTER — Telehealth: Payer: Self-pay | Admitting: Family Medicine

## 2021-07-04 ENCOUNTER — Telehealth: Payer: Self-pay

## 2021-07-04 ENCOUNTER — Other Ambulatory Visit: Payer: Self-pay | Admitting: Endocrinology

## 2021-07-04 DIAGNOSIS — E1165 Type 2 diabetes mellitus with hyperglycemia: Secondary | ICD-10-CM

## 2021-07-04 NOTE — Telephone Encounter (Signed)
Pt called to ask if she can get a call when she needs to schedule her fasting labs after her notes are read from endocrinology by Dr Doreene Burke. ?

## 2021-07-04 NOTE — Telephone Encounter (Signed)
Patient called wanted to know if she is suppose to be taking repaglinide. Informed patient she is and she understands. She also would like to have labs done at Dr Evangeline Gula office because it is easier for her. Dr Evangeline Gula office said they had to have your permission. ?

## 2021-07-09 ENCOUNTER — Telehealth: Payer: Self-pay

## 2021-07-09 NOTE — Telephone Encounter (Signed)
Unable to reach the patient and also unable to leave a voice message. Was calling the patient to confirm that she wasn't taking her Valsartan 80 mg as reported earlier this month to her Primary care Physician. I did not refill this medication but if she does call back and something has changed please refill the medication and advise her that she needs a follow up appointment with Dr. Cristal Deer.  ?

## 2021-07-09 NOTE — Telephone Encounter (Signed)
Called office and they state that as long as orders are in they will be able to draw labs ?

## 2021-07-11 DIAGNOSIS — E119 Type 2 diabetes mellitus without complications: Secondary | ICD-10-CM | POA: Diagnosis not present

## 2021-07-18 ENCOUNTER — Other Ambulatory Visit (INDEPENDENT_AMBULATORY_CARE_PROVIDER_SITE_OTHER): Payer: Medicare HMO

## 2021-07-18 ENCOUNTER — Other Ambulatory Visit: Payer: Medicare HMO

## 2021-07-18 DIAGNOSIS — E1165 Type 2 diabetes mellitus with hyperglycemia: Secondary | ICD-10-CM | POA: Diagnosis not present

## 2021-07-18 LAB — COMPREHENSIVE METABOLIC PANEL
ALT: 33 U/L (ref 0–35)
AST: 25 U/L (ref 0–37)
Albumin: 4.2 g/dL (ref 3.5–5.2)
Alkaline Phosphatase: 65 U/L (ref 39–117)
BUN: 12 mg/dL (ref 6–23)
CO2: 28 mEq/L (ref 19–32)
Calcium: 9.5 mg/dL (ref 8.4–10.5)
Chloride: 102 mEq/L (ref 96–112)
Creatinine, Ser: 0.68 mg/dL (ref 0.40–1.20)
GFR: 90.3 mL/min (ref >60.00)
Glucose, Bld: 150 mg/dL — ABNORMAL HIGH (ref 70–99)
Potassium: 4.5 mEq/L (ref 3.5–5.1)
Sodium: 138 mEq/L (ref 135–145)
Total Bilirubin: 0.7 mg/dL (ref 0.2–1.2)
Total Protein: 6.9 g/dL (ref 6.0–8.3)

## 2021-07-18 LAB — LIPID PANEL
Cholesterol: 128 mg/dL (ref 0–200)
HDL: 48.3 mg/dL (ref >39.00)
LDL Cholesterol: 58 mg/dL (ref 0–99)
NonHDL: 79.26
Total CHOL/HDL Ratio: 3
Triglycerides: 105 mg/dL (ref 0.0–149.0)
VLDL: 21 mg/dL (ref 0.0–40.0)

## 2021-07-18 LAB — HEMOGLOBIN A1C: Hgb A1c MFr Bld: 7.3 % — ABNORMAL HIGH (ref 4.6–6.5)

## 2021-07-22 ENCOUNTER — Ambulatory Visit: Payer: Medicare HMO | Admitting: Endocrinology

## 2021-07-22 ENCOUNTER — Encounter: Payer: Self-pay | Admitting: Endocrinology

## 2021-07-22 VITALS — BP 142/72 | HR 88 | Ht 60.5 in | Wt 134.6 lb

## 2021-07-22 DIAGNOSIS — E1165 Type 2 diabetes mellitus with hyperglycemia: Secondary | ICD-10-CM

## 2021-07-22 DIAGNOSIS — Z794 Long term (current) use of insulin: Secondary | ICD-10-CM

## 2021-07-22 DIAGNOSIS — E782 Mixed hyperlipidemia: Secondary | ICD-10-CM

## 2021-07-22 NOTE — Patient Instructions (Addendum)
Take Farxiga and Prandin 15-30 min before breakfast ? ?Take only 1 Prandin at dinner ? ?Take invokana when [place] finishing  ?

## 2021-07-22 NOTE — Progress Notes (Signed)
Patient ID: Marie [name], female   DOB: 07/18/1955, 66 y.o.   MRN: [id number] ? ?       ? ? ?Reason for Appointment: Follow-up for Type 2 Diabetes ? ? ?History of Present Illness:  ?        ?Date of diagnosis of type 2 diabetes mellitus: 2010      ? ?Background history:  ? ?She was likely treated with Metformin alone for the first few years and subsequently given Januvia ?In 2018 she was also given [place] in addition ?Current records indicate her highest A1c was 9.6 in 03/2017.  Has been progressively improving since then ?She was also started on Rybelsus in 02/2019 ? ?Recent history:  ? ?A1c is 7.3 ? ?Non-insulin hypoglycemic drugs the patient is taking are: Metformin 1.0 g daily, Farxiga 10 mg at night, Prandin 4 mg before meals  ? ? ?Current management, blood sugar patterns and problems identified ? ?Her blood sugars are again tending to be high after breakfast with average over 200 ?Again she is taking her Prandin right before eating instead of 20 minutes or more before eating ?As recommended she is only getting 1 slice of toast even with some protein like eggs and cheese at breakfast ?On the other days blood sugars do not appear to be going up excessively  ?She has also had occasional low normal readings on her sensor overnight with her generally eating smaller low carbohydrate meals at dinnertime and still taking 2 tablets of Prandin before dinner instead of 1 ?Her weight is about the same ?Apparently her insurance has changed and she was not able to get Com[place]os covered ?She was sent a prescription for Jardiance 25 mg instead but she is thinks that after a couple of days of taking this she had very frequent urination along with some nonspecific left chest pain and she went back on Farxiga ?However she is taking [place] at lunch instead of in the morning ?Blood sugars are generally very stable in the late afternoons and evenings although somewhat variable after lunch ?Overall not clear why her blood sugars are  relatively higher recently compared to her last visit ?She has been regular with her exercise and does using her home exercise equipment ? ?      ?Side effects from medications have been: Decreased appetite from Rybelsus  ? ?Typical meal intake: Breakfast is egg, toast .             ? ?Exercise:  Exercise equipment, ? elliptical, using for 22  min daily  ? ?Glucose monitoring:  Using freestyle libre  ? ?Data for the last 2 weeks ? ?CGM use % of time 75  ?2-week average/GV 153  ?Time in range     72   %  ?% Time Above 180 22+6  ?% Time above 250   ?% Time Below 70 0  ? ? ? ?Previously ? ?CGM use % of time 70  ?2-week average/GV 136  ?Time in range      84 %  ?% Time Above 180 11+4  ?% Time above 250   ?% Time Below 70 1  ? ?  ?PRE-MEAL Fasting Lunch Dinner Bedtime Overall  ?Glucose range:       ?Averages: 118    131  ? ?POST-MEAL PC Breakfast PC Lunch PC Dinner  ?Glucose range:     ?Averages: 165 123 143  ? ? ?Dietician visit, most recent: 11/14 ? ?Weight history:  ? ?Wt Readings from Last 3  Encounters:  ?07/22/21 134 lb 9.6 oz (61.1 kg)  ?06/10/21 137 lb 12.8 oz (62.5 kg)  ?05/22/21 134 lb 12.8 oz (61.1 kg)  ? ? ?Glycemic control: ?  ?Lab Results  ?Component Value Date  ? HGBA1C 7.3 (H) 07/18/2021  ? HGBA1C 7.3 (H) 06/11/2021  ? HGBA1C 7.1 (H) 05/20/2021  ? ?Lab Results  ?Component Value Date  ? MICROALBUR 0.8 12/13/2020  ? LDLCALC 58 07/18/2021  ? CREATININE 0.68 07/18/2021  ? ?Lab Results  ?Component Value Date  ? MICRALBCREAT 1.2 12/13/2020  ? ? ?Lab Results  ?Component Value Date  ? FRUCTOSAMINE 262 02/14/2021  ? FRUCTOSAMINE 260 10/25/2020  ? ? ?Lab on 07/18/2021  ?Component Date Value Ref Range Status  ? Cholesterol 07/18/2021 128  0 - 200 mg/dL Final  ? ATP III Classification       Desirable:  < 200 mg/dL               Borderline High:  200 - 239 mg/dL          High:  > = 161240 mg/dL  ? Triglycerides 07/18/2021 105.0  0.0 - 149.0 mg/dL Final  ? Normal:  <096<150 mg/dLBorderline High:  150 - 199 mg/dL  ? HDL  07/18/2021 48.30  >39.00 mg/dL Final  ? VLDL 04/54/098104/14/2023 21.0  0.0 - 40.0 mg/dL Final  ? LDL Cholesterol 07/18/2021 58  0 - 99 mg/dL Final  ? Total CHOL/HDL Ratio 07/18/2021 3   Final  ?                Men          Women1/2 Average Risk     3.4          3.3Average Risk          5.0          4.42X Average Risk          9.6          7.13X Average Risk          15.0          11.0                      ? NonHDL 07/18/2021 79.26   Final  ? NOTE:  Non-HDL goal should be 30 mg/dL higher than patient's LDL goal (i.e. LDL goal of < 70 mg/dL, would have non-HDL goal of < 100 mg/dL)  ? Sodium 07/18/2021 138  135 - 145 mEq/L Final  ? Potassium 07/18/2021 4.5  3.5 - 5.1 mEq/L Final  ? Chloride 07/18/2021 102  96 - 112 mEq/L Final  ? CO2 07/18/2021 28  19 - 32 mEq/L Final  ? Glucose, Bld 07/18/2021 150 (H)  70 - 99 mg/dL Final  ? BUN 19/14/782904/14/2023 12  6 - 23 mg/dL Final  ? Creatinine, Ser 07/18/2021 0.68  0.40 - 1.20 mg/dL Final  ? Total Bilirubin 07/18/2021 0.7  0.2 - 1.2 mg/dL Final  ? Alkaline Phosphatase 07/18/2021 65  39 - 117 U/L Final  ? AST 07/18/2021 25  0 - 37 U/L Final  ? ALT 07/18/2021 33  0 - 35 U/L Final  ? Total Protein 07/18/2021 6.9  6.0 - 8.3 g/dL Final  ? Albumin 56/21/308604/14/2023 4.2  3.5 - 5.2 g/dL Final  ? GFR 57/84/696204/14/2023 90.30  >60.00 mL/min Final  ? Calculated using the CKD-EPI Creatinine Equation (2021)  ? Calcium 07/18/2021 9.5  8.4 - 10.5 mg/dL Final  ?  Hgb A1c MFr Bld 07/18/2021 7.3 (H)  4.6 - 6.5 % Final  ? Glycemic Control Guidelines for People with Diabetes:Non Diabetic:  <6%Goal of Therapy: <7%Additional Action Suggested:  >8%   ? ? ?Allergies as of 07/22/2021   ? ?   Reactions  ? Penicillins Other (See Comments)  ? Driving couldn't see and head felt heavy  ?Has patient had a PCN reaction causing immediate rash, facial/tongue/throat swelling, SOB or lightheadedness with hypotension: No ?Has patient had a PCN reaction causing severe rash involving mucus membranes or skin necrosis: No ?Has patient had a PCN  reaction that required hospitalization: No ?Has patient had a PCN reaction occurring within the last 10 years: No ?If all of the above answers are "NO", then may proceed with Cephalosporin use.  ? ?  ? ?  ?Medication List  ?  ? ?  ? Accurate as of July 22, 2021  4:31 PM. If you have any questions, ask your nurse or doctor.  ?  ?  ? ?  ? ?aspirin EC 81 MG tablet ?Take 81 mg by mouth at bedtime. ?  ?atorvastatin 80 MG tablet ?Commonly known as: LIPITOR ?TAKE 1 TABLET(80 MG) BY MOUTH DAILY ?  ?FreeStyle Libre 2 Sensor Misc ?2 Devices by Does not apply route every 14 (fourteen) days. ?  ?glucose blood test strip ?1 each by Other route as needed for other. Use as instructed ?  ?lisinopril 40 MG tablet ?Commonly known as: ZESTRIL ?Take 1 tablet (40 mg total) by mouth daily. ?  ?metFORMIN 500 MG 24 hr tablet ?Commonly known as: GLUCOPHAGE-XR ?TAKE 2 TABLETS(1000 MG) BY MOUTH IN THE MORNING AND AT BEDTIME ?  ?multivitamin with minerals Tabs tablet ?Take 1 tablet by mouth daily. ?  ?repaglinide 2 MG tablet ?Commonly known as: PRANDIN ?TAKE 2 TABLETS BY MOUTH  BEFORE BREAKFAST , 1 TABLET AT LUNCH AND 1 TABLET  BEFORE DINNER ?  ?terconazole 0.4 % vaginal cream ?Commonly known as: TERAZOL 7 ?Place 1 applicator vaginally at bedtime. Use for seven days ?  ?Vitamin D3 75 MCG (3000 UT) Tabs ?Take 3,000 Units by mouth at bedtime. ?  ? ?  ? ? ?Allergies:  ?Allergies  ?Allergen Reactions  ? Penicillins Other (See Comments)  ?  Driving couldn't see and head felt heavy  ?Has patient had a PCN reaction causing immediate rash, facial/tongue/throat swelling, SOB or lightheadedness with hypotension: No ?Has patient had a PCN reaction causing severe rash involving mucus membranes or skin necrosis: No ?Has patient had a PCN reaction that required hospitalization: No ?Has patient had a PCN reaction occurring within the last 10 years: No ?If all of the above answers are "NO", then may proceed with Cephalosporin use. ?  ? ? ?Past Medical  History:  ?Diagnosis Date  ? CVA (cerebral vascular accident) (HCC) 2015  ? Depression   ? Diabetes (HCC)   ? t2  ? Gallstones   ? Hyperlipemia   ? Hypertension   ? Migraines   ? ? ?Past Surgical History:  ?Procedure La

## 2021-07-29 ENCOUNTER — Encounter (INDEPENDENT_AMBULATORY_CARE_PROVIDER_SITE_OTHER): Payer: Self-pay | Admitting: Ophthalmology

## 2021-07-29 ENCOUNTER — Ambulatory Visit (INDEPENDENT_AMBULATORY_CARE_PROVIDER_SITE_OTHER): Payer: Medicare HMO | Admitting: Ophthalmology

## 2021-07-29 DIAGNOSIS — E113492 Type 2 diabetes mellitus with severe nonproliferative diabetic retinopathy without macular edema, left eye: Secondary | ICD-10-CM | POA: Diagnosis not present

## 2021-07-29 DIAGNOSIS — E113411 Type 2 diabetes mellitus with severe nonproliferative diabetic retinopathy with macular edema, right eye: Secondary | ICD-10-CM

## 2021-07-29 NOTE — Assessment & Plan Note (Signed)
Stable macula, by OCT, will dilate OU next ?

## 2021-07-29 NOTE — Assessment & Plan Note (Addendum)
Now 8 months post injection of alluvion, for recalcitrant diabetic CSME OD.  Is currently at additional PRP applied to regions of nonperfusion some 1 month previous ? ?No signs of intraocular pressure elevation OD. ? ?

## 2021-07-29 NOTE — Progress Notes (Signed)
? ? ?07/29/2021 ? ?  ? ?CHIEF COMPLAINT ?Patient presents for  ?Chief Complaint  ?Patient presents with  ? Diabetic Retinopathy with Macular Edema  ? ? ? ? ?HISTORY OF PRESENT ILLNESS: ?Marie [name] is a 66 y.o. female who presents to the clinic today for:  ? ?HPI   ?8 weeks dilate OD, OCT. ?Patient states vision is stable and unchanged since last visit. Denies any new floaters or FOL. ?LBS: 107 this morning, when patient woke up, per patient. ? ?Patient reports improvement in vision by now seeing shapes and also return of color vision in the right eye and with a magnifier able to see a details up close in the right eye. ?Last edited by Edmon Crapeankin, Crystall Donaldson A, MD on 07/29/2021  3:14 PM.  ?  ? ? ?Referring physician: ?Mliss SaxKremer, William Alfred, MD ?[address] ?[place] [id number] ? ?HISTORICAL INFORMATION:  ? ?Selected notes from the MEDICAL RECORD NUMBER ?  ? ?Lab Results  ?Component Value Date  ? HGBA1C 7.3 (H) 07/18/2021  ?  ? ?CURRENT MEDICATIONS: ?No current outpatient medications on file. (Ophthalmic Drugs)  ? ?No current facility-administered medications for this visit. (Ophthalmic Drugs)  ? ?Current Outpatient Medications (Other)  ?Medication Sig  ? aspirin EC 81 MG tablet Take 81 mg by mouth at bedtime.  ? atorvastatin (LIPITOR) 80 MG tablet TAKE 1 TABLET(80 MG) BY MOUTH DAILY  ? Cholecalciferol (VITAMIN D3) 3000 units TABS Take 3,000 Units by mouth at bedtime.  ? Continuous Blood Gluc Sensor (FREESTYLE LIBRE 2 SENSOR) MISC 2 Devices by Does not apply route every 14 (fourteen) days.  ? glucose blood test strip 1 each by Other route as needed for other. Use as instructed (Patient not taking: Reported on 02/17/2021)  ? lisinopril (ZESTRIL) 40 MG tablet Take 1 tablet (40 mg total) by mouth daily.  ? metFORMIN (GLUCOPHAGE-XR) 500 MG 24 hr tablet TAKE 2 TABLETS(1000 MG) BY MOUTH IN THE MORNING AND AT BEDTIME  ? Multiple Vitamin (MULTIVITAMIN WITH MINERALS) TABS tablet Take 1 tablet by mouth daily.  ? repaglinide  (PRANDIN) 2 MG tablet TAKE 2 TABLETS BY MOUTH  BEFORE BREAKFAST , 1 TABLET AT LUNCH AND 1 TABLET  BEFORE DINNER  ? terconazole (TERAZOL 7) 0.4 % vaginal cream Place 1 applicator vaginally at bedtime. Use for seven days  ? ?No current facility-administered medications for this visit. (Other)  ? ? ? ? ?REVIEW OF SYSTEMS: ?ROS   ?Negative for: Constitutional, Gastrointestinal, Neurological, Skin, Genitourinary, Musculoskeletal, HENT, Endocrine, Cardiovascular, Eyes, Respiratory, Psychiatric, Allergic/Imm, Heme/Lymph ?Last edited by Edmon Crapeankin, Tawnya Pujol A, MD on 07/29/2021  3:10 PM.  ?  ? ? ? ?ALLERGIES ?Allergies  ?Allergen Reactions  ? Penicillins Other (See Comments)  ?  Driving couldn't see and head felt heavy  ?Has patient had a PCN reaction causing immediate rash, facial/tongue/throat swelling, SOB or lightheadedness with hypotension: No ?Has patient had a PCN reaction causing severe rash involving mucus membranes or skin necrosis: No ?Has patient had a PCN reaction that required hospitalization: No ?Has patient had a PCN reaction occurring within the last 10 years: No ?If all of the above answers are "NO", then may proceed with Cephalosporin use. ?  ? ? ?PAST MEDICAL HISTORY ?Past Medical History:  ?Diagnosis Date  ? CVA (cerebral vascular accident) (HCC) 2015  ? Depression   ? Diabetes (HCC)   ? t2  ? Gallstones   ? Hyperlipemia   ? Hypertension   ? Migraines   ? ?Past Surgical  History:  ?Procedure Laterality Date  ? BREAST BIOPSY Right 05/2019  ? CESAREAN SECTION    ? x2  ? CHOLECYSTECTOMY N/A 05/25/2016  ? Procedure: LAPAROSCOPIC CHOLECYSTECTOMY WITH INTRAOPERATIVE CHOLANGIOGRAM;  Surgeon: Luretha Murphy, MD;  Location: [facility] ORS;  Service: General;  Laterality: N/A;  ? IR GENERIC HISTORICAL  03/13/2016  ? IR PERC CHOLECYSTOSTOMY 03/13/2016 [name], MD [facility]-INTERV RAD  ? IR GENERIC HISTORICAL  04/28/2016  ? IR RADIOLOGIST EVAL & MGMT 04/28/2016 [name], MD [facility] INTER[facility] RAD  ? LOOP RECORDER IMPLANT N/A 11/29/2013  ?  Procedure: LOOP RECORDER IMPLANT;  Surgeon: Marinus Maw, MD;  Location: [facility] CATH LAB;  Service: Cardiovascular;  Laterality: N/A;  ? TEE WITHOUT CARDIOVERSION N/A 08/24/2013  ? Procedure: TRANSESOPHAGEAL ECHOCARDIOGRAM (TEE);  Surgeon: [name], MD;  Location: [facility] ENDOSCOPY;  Service: Cardiovascular;  Laterality: N/A;  ? ? ?FAMILY HISTORY ?Family History  ?Problem Relation Age of Onset  ? Stomach cancer Mother   ? High blood pressure Mother   ? Diabetes Mother   ? Diabetes Maternal Grandfather   ? High blood pressure Maternal Grandfather   ? Heart disease Neg Hx   ? ? ?SOCIAL HISTORY ?Social History  ? ?Tobacco Use  ? Smoking status: Never  ? Smokeless tobacco: Never  ?Vaping Use  ? Vaping Use: Never used  ?Substance Use Topics  ? Alcohol use: No  ?  Alcohol/week: 1.0 standard drink  ?  Types: 1 Standard drinks or equivalent per week  ?  Comment: Social  ? Drug use: No  ? ?  ? ?  ? ?OPHTHALMIC EXAM: ? ?Base Eye Exam   ? ? Visual Acuity (ETDRS)   ? ?   Right Left  ? Dist [place] 20/160 20/25 -2  ? Dist ph [place] 20/100 -1   ? ?  ?  ? ? Tonometry (Tonopen, 2:13 PM)   ? ?   Right Left  ? Pressure 15 13  ? ?  ?  ? ? Pupils   ? ?   Pupils Dark Light APD  ? Right PERRL 5 4 None  ? Left PERRL 5 4 None  ? ?  ?  ? ? Extraocular Movement   ? ?   Right Left  ?  Full Full  ? ?  ?  ? ? Neuro/Psych   ? ? Oriented x3: Yes  ? Mood/Affect: Normal  ? ?  ?  ? ? Dilation   ? ? Both eyes: 1.0% Mydriacyl, 2.5% Phenylephrine @ 2:12 PM  ? ?  ?  ? ?  ? ?Slit Lamp and Fundus Exam   ? ? External Exam   ? ?   Right Left  ? External Normal Normal  ? ?  ?  ? ? Slit Lamp Exam   ? ?   Right Left  ? Lids/Lashes Normal Normal  ? Conjunctiva/Sclera White and quiet White and quiet  ? Cornea Clear Clear  ? Anterior Chamber Deep and quiet Deep and quiet  ? Iris Round and reactive Round and reactive  ? Lens Posterior chamber intraocular lens, , 2+ Posterior capsular opacification 2+ Nuclear sclerosis, Cortical cataract  ? Anterior Vitreous Normal Normal   ? ?  ?  ? ? Fundus Exam   ? ?   Right Left  ? Posterior Vitreous Posterior vitreous detachment, Central vitreous floaters, Iluvien implant visualized in the inferotemporal quadrant after injection procedure completed   ? Disc Normal   ? C/D Ratio 0.5   ?  Macula Focal laser scars, Microaneurysms, much less, no discernible thickening   ? Vessels Retinopathy,, severe NPDR   ? Periphery Normal, good prp, room 360, for more laser in the future, posteriorly, and nasally and inferonasally some room anteriorly as well   ? ?  ?  ? ?  ? ? ?IMAGING AND PROCEDURES  ?Imaging and Procedures for 07/29/21 ? ?OCT, Retina - OU - Both Eyes   ? ?   ?Right Eye ?Quality was good. Scan locations included subfoveal. Central Foveal Thickness: 336. Findings include abnormal foveal contour, cystoid macular edema, epiretinal membrane.  ? ?Left Eye ?Quality was good. Scan locations included subfoveal. Central Foveal Thickness: 347. Findings include abnormal foveal contour.  ? ?Notes ? ?Now  8 months status post injection intravitreal Iluvien for long-term steroid slow release OD for CSME continued vast improvement in CSME OD yet some lingering CME noted.  Mild ERM, center involved CME ? ?OS stable overall, with micro CME not progressive ? ?  ? ? ?  ?  ? ?  ?ASSESSMENT/PLAN: ? ?Severe nonproliferative diabetic retinopathy of right eye, with macular edema, associated with type 2 diabetes mellitus (HCC) ?Now 8 months post injection of alluvion, for recalcitrant diabetic CSME OD.  Is currently at additional PRP applied to regions of nonperfusion some 1 month previous ? ?No signs of intraocular pressure elevation OD. ? ? ?Severe nonproliferative diabetic retinopathy of left eye (HCC) ?Stable macula, by OCT, will dilate OU next  ? ?  ICD-10-CM   ?1. Severe nonproliferative diabetic retinopathy of right eye, with macular edema, associated with type 2 diabetes mellitus (HCC)  E11.3411 OCT, Retina - OU - Both Eyes  ?  ?2. Severe nonproliferative  diabetic retinopathy of left eye without macular edema associated with type 2 diabetes mellitus (HCC)  T25.4982   ?  ? ? ?1.  OD with severe NPDR with center involved CSME which was recalcitrant to all forms of

## 2021-07-31 ENCOUNTER — Encounter (INDEPENDENT_AMBULATORY_CARE_PROVIDER_SITE_OTHER): Payer: Medicare HMO | Admitting: Ophthalmology

## 2021-08-11 ENCOUNTER — Other Ambulatory Visit: Payer: Medicare HMO

## 2021-08-15 ENCOUNTER — Other Ambulatory Visit: Payer: Medicare HMO

## 2021-08-18 ENCOUNTER — Ambulatory Visit: Payer: Medicare HMO | Admitting: Endocrinology

## 2021-10-08 ENCOUNTER — Telehealth: Payer: Self-pay | Admitting: Family Medicine

## 2021-10-08 NOTE — Telephone Encounter (Signed)
Pt stated she always gets labs before she sees Dr. Lucianne Muss. I did not see orders for labs. I told her Dr. Doreene Burke or Richarda Overlie would call her and let her know the order is in.

## 2021-10-09 NOTE — Telephone Encounter (Signed)
Has lab appt 10/17/2021.

## 2021-10-09 NOTE — Telephone Encounter (Signed)
Pt VM Full, will call back

## 2021-10-17 ENCOUNTER — Other Ambulatory Visit (INDEPENDENT_AMBULATORY_CARE_PROVIDER_SITE_OTHER): Payer: Medicare HMO

## 2021-10-17 DIAGNOSIS — E1165 Type 2 diabetes mellitus with hyperglycemia: Secondary | ICD-10-CM

## 2021-10-17 DIAGNOSIS — Z794 Long term (current) use of insulin: Secondary | ICD-10-CM | POA: Diagnosis not present

## 2021-10-17 LAB — HEMOGLOBIN A1C: Hgb A1c MFr Bld: 7.2 % — ABNORMAL HIGH (ref 4.6–6.5)

## 2021-10-17 LAB — BASIC METABOLIC PANEL
BUN: 16 mg/dL (ref 6–23)
CO2: 26 mEq/L (ref 19–32)
Calcium: 9.1 mg/dL (ref 8.4–10.5)
Chloride: 105 mEq/L (ref 96–112)
Creatinine, Ser: 0.78 mg/dL (ref 0.40–1.20)
GFR: 78.61 mL/min (ref >60.00)
Glucose, Bld: 86 mg/dL (ref 70–99)
Potassium: 3.6 mEq/L (ref 3.5–5.1)
Sodium: 140 mEq/L (ref 135–145)

## 2021-10-17 NOTE — Progress Notes (Signed)
labs

## 2021-10-20 ENCOUNTER — Encounter: Payer: Self-pay | Admitting: Endocrinology

## 2021-10-20 ENCOUNTER — Ambulatory Visit: Payer: Medicare HMO | Admitting: Endocrinology

## 2021-10-20 ENCOUNTER — Encounter: Payer: Self-pay | Admitting: Family Medicine

## 2021-10-20 ENCOUNTER — Ambulatory Visit (INDEPENDENT_AMBULATORY_CARE_PROVIDER_SITE_OTHER): Payer: Medicare HMO | Admitting: Family Medicine

## 2021-10-20 VITALS — BP 132/86 | HR 76 | Ht 60.5 in | Wt 128.0 lb

## 2021-10-20 VITALS — BP 126/70 | HR 76 | Temp 97.5°F | Ht 60.0 in | Wt 127.0 lb

## 2021-10-20 DIAGNOSIS — E119 Type 2 diabetes mellitus without complications: Secondary | ICD-10-CM | POA: Diagnosis not present

## 2021-10-20 DIAGNOSIS — E1165 Type 2 diabetes mellitus with hyperglycemia: Secondary | ICD-10-CM | POA: Diagnosis not present

## 2021-10-20 DIAGNOSIS — F341 Dysthymic disorder: Secondary | ICD-10-CM | POA: Diagnosis not present

## 2021-10-20 DIAGNOSIS — R634 Abnormal weight loss: Secondary | ICD-10-CM | POA: Diagnosis not present

## 2021-10-20 LAB — POCT GLUCOSE (DEVICE FOR HOME USE): Glucose Fasting, POC: 106 mg/dL — AB (ref 70–99)

## 2021-10-20 MED ORDER — DEXCOM G6 SENSOR MISC: 3 refills | Status: DC

## 2021-10-20 MED ORDER — CANAGLIFLOZIN 100 MG PO TABS: ORAL_TABLET | ORAL | 3 refills | Status: DC

## 2021-10-20 MED ORDER — ESCITALOPRAM OXALATE 10 MG PO TABS: 10.0000 mg | ORAL_TABLET | Freq: Every day | ORAL | 1 refills | Status: DC

## 2021-10-20 MED ORDER — DEXCOM G6 RECEIVER DEVI: 0 refills | Status: DC

## 2021-10-20 NOTE — Progress Notes (Signed)
Established Patient Office Visit  Subjective   Patient ID: Marie [name], female    DOB: Aug 31, 1955  Age: 67 y.o. MRN: [id number]  Chief Complaint  Patient presents with   Weight Loss    Concerns about continued weight loss.     HPI for 63-month history of decreased appetite.  Feels okay other than that.  Weight is more or less been stable it is up and down a little bit.  She denies night sweats.  Dr. Lucianne Muss is concerned.  She is visually impaired and is the primary caregiver of her mentally challenged son.  Her husband works back in [place].  She does worry about her son and has been sad some.  Her health maintenance is up-to-date.  Mammogram November of last year.  Advised to return in 1 year.  She had a pelvic exam without a Pap smear and Cologuard testing in September 2022.  TSH was normal in 2020.   Review of Systems  Constitutional:  Negative for chills, diaphoresis, malaise/fatigue and weight loss.  HENT: Negative.    Eyes: Negative.  Negative for blurred vision and double vision.  Respiratory: Negative.    Cardiovascular:  Negative for chest pain.  Gastrointestinal:  Negative for abdominal pain.  Genitourinary: Negative.   Musculoskeletal:  Negative for falls and myalgias.  Neurological:  Negative for speech change, loss of consciousness and weakness.  Psychiatric/Behavioral: Negative.           10/20/2021    4:07 PM 06/10/2021    3:55 PM 01/24/2021    9:11 AM  Depression screen PHQ 2/9  Decreased Interest 0 0 0  Down, Depressed, Hopeless 0 0 0  PHQ - 2 Score 0 0 0     Objective:     BP 126/70 (BP Location: Right Arm, Patient Position: Sitting, Cuff Size: Normal)   Pulse 76   Temp (!) 97.5 F (36.4 C) (Temporal)   Ht 5' (1.524 m)   Wt 127 lb (57.6 kg)   SpO2 100%   BMI 24.80 kg/m  Wt Readings from Last 3 Encounters:  10/20/21 127 lb (57.6 kg)  10/20/21 128 lb (58.1 kg)  07/22/21 134 lb 9.6 oz (61.1 kg)      Physical Exam Constitutional:       General: She is not in acute distress.    Appearance: Normal appearance. She is not ill-appearing, toxic-appearing or diaphoretic.  HENT:     Head: Normocephalic and atraumatic.     Right Ear: External ear normal.     Left Ear: External ear normal.     Mouth/Throat:     Mouth: Mucous membranes are moist.     Pharynx: Oropharynx is clear. No oropharyngeal exudate or posterior oropharyngeal erythema.  Eyes:     General: No scleral icterus.       Right eye: No discharge.        Left eye: No discharge.     Extraocular Movements: Extraocular movements intact.     Conjunctiva/sclera: Conjunctivae normal.     Pupils: Pupils are equal, round, and reactive to light.  Cardiovascular:     Rate and Rhythm: Normal rate and regular rhythm.  Pulmonary:     Effort: Pulmonary effort is normal. No respiratory distress.     Breath sounds: Normal breath sounds.  Musculoskeletal:     Cervical back: No rigidity or tenderness.  Skin:    General: Skin is warm and dry.  Neurological:     Mental Status: She is alert  and oriented to person, place, and time.  Psychiatric:        Mood and Affect: Mood normal.        Behavior: Behavior normal.      Results for orders placed or performed in visit on 10/20/21  POCT Glucose (Device for Home Use)  Result Value Ref Range   Glucose Fasting, POC 106 (A) 70 - 99 mg/dL   POC Glucose        The ASCVD Risk score (Arnett DK, et al., 2019) failed to calculate for the following reasons:   The valid total cholesterol range is 130 to 320 mg/dL    Assessment & Plan:   Problem List Items Addressed This Visit   None Visit Diagnoses     Dysthymia    -  Primary   Relevant Medications   escitalopram (LEXAPRO) 10 MG tablet       Return in about 6 weeks (around 12/01/2021).  We will try Lexapro for elevation of mood and hopefully increased in appetite.  She is in a stressful social situation.  Mliss Sax, MD

## 2021-10-20 NOTE — Progress Notes (Signed)
Patient ID: Marie [name], female   DOB: 04-25-1955, 66 y.o.   MRN: [id number]           Reason for Appointment: Follow-up for Type 2 Diabetes   History of Present Illness:          Date of diagnosis of type 2 diabetes mellitus: 2010       Background history:   She was likely treated with Metformin alone for the first few years and subsequently given Januvia In 2018 she was also given [place] in addition Current records indicate her highest A1c was 9.6 in 03/2017.  Has been progressively improving since then She was also started on Rybelsus in 02/2019  Recent history:   A1c is 7.2 compared to 7.3  Non-insulin hypoglycemic drugs the patient is taking are: Metformin 1.0 g daily, Farxiga 10 mg at night, Prandin 4 mg before meals    Current management, blood sugar patterns and problems identified  Her blood sugar in the office was 106 but on her sensor at the same time it was 82  Lab glucose was not correlated with her freestyle libre sensor since she did not have any active readings on her sensor download at the same time She is concerned about getting low sugars during the night with readings mostly in the upper 60s At this time she is completely asymptomatic and she does eat some candy or something sweet for this  Although she is supposed to be switching from [place] [facility] she still has not finished her previous supply of [place]  She is generally taking her PRANDIN as directed However blood sugars are as high as 312 after breakfast  For breakfast she will eat 2 slices of toast, egg and sometimes a fruit and postprandial readings are somewhat variable However overall she thinks that she is having decreased appetite and has lost 6 pounds over the last 3 months She has been regular with her exercise and does using her home exercise equipment        Side effects from medications have been: Decreased appetite from Rybelsus   Typical meal intake: Breakfast is egg, toast .               Exercise:  Exercise equipment, ? elliptical, using for about 20 min daily   Glucose monitoring:  Using freestyle libre   Data for the last 2 weeks  Interpretation of the download as follows  LOWEST blood sugars are early morning before 6 AM and highest 8-10 AM Overnight blood sugars are averaging about 135 at midnight, decreasing progressively until about 5-6 AM and then progressively rising till about 10 AM Minimal hypoglycemia with only transient low normal readings overnight or once around 4 PM POSTPRANDIAL readings are inconsistent after breakfast and periodically spiking significantly to around 250-300 at times but sometimes not as much with averages as below Postprandial readings after lunch are generally like to lower but after DINNER although she has periodic spikes overall blood sugars are not rising excessively   CGM use % of time 91  2-week average/GV 134/35  Time in range     84   %  % Time Above 180 12+3  % Time above 250   % Time Below 70 1     PRE-MEAL Fasting Lunch Dinner Bedtime Overall  Glucose range:       Averages: 130  106     POST-MEAL PC Breakfast PC Lunch PC Dinner  Glucose range:     Averages:  194 132 153    Previously:  CGM use % of time 75  2-week average/GV 153  Time in range     72   %  % Time Above 180 22+6  % Time above 250   % Time Below 70 0     Dietician visit, most recent: 11/14  Weight history:   Wt Readings from Last 3 Encounters:  10/20/21 128 lb (58.1 kg)  07/22/21 134 lb 9.6 oz (61.1 kg)  06/10/21 137 lb 12.8 oz (62.5 kg)    Glycemic control:   Lab Results  Component Value Date   HGBA1C 7.2 (H) 10/17/2021   HGBA1C 7.3 (H) 07/18/2021   HGBA1C 7.3 (H) 06/11/2021   Lab Results  Component Value Date   MICROALBUR 0.8 12/13/2020   LDLCALC 58 07/18/2021   CREATININE 0.78 10/17/2021   Lab Results  Component Value Date   MICRALBCREAT 1.2 12/13/2020    Lab Results  Component Value Date   FRUCTOSAMINE 262  02/14/2021   FRUCTOSAMINE 260 10/25/2020    Office Visit on 10/20/2021  Component Date Value Ref Range Status   Glucose Fasting, POC 10/20/2021 106 (A)  70 - 99 mg/dL Final  Lab on 41/96/2229  Component Date Value Ref Range Status   Sodium 10/17/2021 140  135 - 145 mEq/L Final   Potassium 10/17/2021 3.6  3.5 - 5.1 mEq/L Final   Chloride 10/17/2021 105  96 - 112 mEq/L Final   CO2 10/17/2021 26  19 - 32 mEq/L Final   Glucose, Bld 10/17/2021 86  70 - 99 mg/dL Final   BUN 79/89/2119 16  6 - 23 mg/dL Final   Creatinine, Ser 10/17/2021 0.78  0.40 - 1.20 mg/dL Final   GFR 41/74/0814 78.61  >60.00 mL/min Final   Calculated using the CKD-EPI Creatinine Equation (2021)   Calcium 10/17/2021 9.1  8.4 - 10.5 mg/dL Final   Hgb G8J MFr Bld 10/17/2021 7.2 (H)  4.6 - 6.5 % Final   Glycemic Control Guidelines for People with Diabetes:Non Diabetic:  <6%Goal of Therapy: <7%Additional Action Suggested:  >8%     Allergies as of 10/20/2021       Reactions   Penicillins Other (See Comments)   Driving couldn't see and head felt heavy  Has patient had a PCN reaction causing immediate rash, facial/tongue/throat swelling, SOB or lightheadedness with hypotension: No Has patient had a PCN reaction causing severe rash involving mucus membranes or skin necrosis: No Has patient had a PCN reaction that required hospitalization: No Has patient had a PCN reaction occurring within the last 10 years: No If all of the above answers are "NO", then may proceed with Cephalosporin use.        Medication List        Accurate as of October 20, 2021  2:03 PM. If you have any questions, ask your nurse or doctor.          aspirin EC 81 MG tablet Take 81 mg by mouth at bedtime.   atorvastatin 80 MG tablet Commonly known as: LIPITOR TAKE 1 TABLET(80 MG) BY MOUTH DAILY   canagliflozin 100 MG Tabs tablet Commonly known as: Invokana 1 tablet before breakfast Started by: Reather Littler, MD   FreeStyle Libre 2 Sensor  Misc 2 Devices by Does not apply route every 14 (fourteen) days.   glucose blood test strip 1 each by Other route as needed for other. Use as instructed   lisinopril 40 MG tablet Commonly known as: ZESTRIL Take  1 tablet (40 mg total) by mouth daily.   metFORMIN 500 MG 24 hr tablet Commonly known as: GLUCOPHAGE-XR TAKE 2 TABLETS(1000 MG) BY MOUTH IN THE MORNING AND AT BEDTIME   multivitamin with minerals Tabs tablet Take 1 tablet by mouth daily.   repaglinide 2 MG tablet Commonly known as: PRANDIN TAKE 2 TABLETS BY MOUTH  BEFORE BREAKFAST , 1 TABLET AT LUNCH AND 1 TABLET  BEFORE DINNER   terconazole 0.4 % vaginal cream Commonly known as: TERAZOL 7 Place 1 applicator vaginally at bedtime. Use for seven days   Vitamin D3 75 MCG (3000 UT) Tabs Take 3,000 Units by mouth at bedtime.        Allergies:  Allergies  Allergen Reactions   Penicillins Other (See Comments)    Driving couldn't see and head felt heavy  Has patient had a PCN reaction causing immediate rash, facial/tongue/throat swelling, SOB or lightheadedness with hypotension: No Has patient had a PCN reaction causing severe rash involving mucus membranes or skin necrosis: No Has patient had a PCN reaction that required hospitalization: No Has patient had a PCN reaction occurring within the last 10 years: No If all of the above answers are "NO", then may proceed with Cephalosporin use.     Past Medical History:  Diagnosis Date   CVA (cerebral vascular accident) (HCC) 2015   Depression    Diabetes (HCC)    t2   Gallstones    Hyperlipemia    Hypertension    Migraines     Past Surgical History:  Procedure Laterality Date   BREAST BIOPSY Right 05/2019   CESAREAN SECTION     x2   CHOLECYSTECTOMY N/A 05/25/2016   Procedure: LAPAROSCOPIC CHOLECYSTECTOMY WITH INTRAOPERATIVE CHOLANGIOGRAM;  Surgeon: Luretha Murphy, MD;  Location: [facility] ORS;  Service: General;  Laterality: N/A;   IR GENERIC HISTORICAL  03/13/2016    IR PERC CHOLECYSTOSTOMY 03/13/2016 [name], MD [facility] RAD   IR GENERIC HISTORICAL  04/28/2016   IR RADIOLOGIST EVAL & MGMT 04/28/2016 [name], MD [facility]   LOOP RECORDER IMPLANT N/A 11/29/2013   Procedure: LOOP RECORDER IMPLANT;  Surgeon: Marinus Maw, MD;  Location: [facility] CATH LAB;  Service: Cardiovascular;  Laterality: N/A;   TEE WITHOUT CARDIOVERSION N/A 08/24/2013   Procedure: TRANSESOPHAGEAL ECHOCARDIOGRAM (TEE);  Surgeon: [name], MD;  Location: [facility] ENDOSCOPY;  Service: Cardiovascular;  Laterality: N/A;    Family History  Problem Relation Age of Onset   Stomach cancer Mother    High blood pressure Mother    Diabetes Mother    Diabetes Maternal Grandfather    High blood pressure Maternal Grandfather    Heart disease Neg Hx     Social History:  reports that she has never smoked. She has never used smokeless tobacco. She reports that she does not drink alcohol and does not use drugs.   Review of Systems    Lipid history: LDL controlled with atorvastatin 80 mg. Followed by PCP   Lab Results  Component Value Date   CHOL 128 07/18/2021   HDL 48.30 07/18/2021   LDLCALC 58 07/18/2021   LDLDIRECT 136.0 09/24/2020   TRIG 105.0 07/18/2021   CHOLHDL 3 07/18/2021           Hypertension: Has been present and treated with lisinopril 40 mg Followed by PCP  BP Readings from Last 3 Encounters:  10/20/21 132/86  07/22/21 (!) 142/72  06/10/21 138/72    Most recent eye exam was in 11/21.  Has retinopathy  Most recent foot exam: 11/21  Currently known complications of diabetes: Retinopathy with decreased vision on the right, regularly followed by retinal surgery  Normal microalbumin in 7/21  History of CVA with left facial numbness and speech difficulty  LABS:  Office Visit on 10/20/2021  Component Date Value Ref Range Status   Glucose Fasting, POC 10/20/2021 106 (A)  70 - 99 mg/dL Final  Lab on 10/17/2021  Component Date Value Ref Range Status    Sodium 10/17/2021 140  135 - 145 mEq/L Final   Potassium 10/17/2021 3.6  3.5 - 5.1 mEq/L Final   Chloride 10/17/2021 105  96 - 112 mEq/L Final   CO2 10/17/2021 26  19 - 32 mEq/L Final   Glucose, Bld 10/17/2021 86  70 - 99 mg/dL Final   BUN 10/17/2021 16  6 - 23 mg/dL Final   Creatinine, Ser 10/17/2021 0.78  0.40 - 1.20 mg/dL Final   GFR 10/17/2021 78.61  >60.00 mL/min Final   Calculated using the CKD-EPI Creatinine Equation (2021)   Calcium 10/17/2021 9.1  8.4 - 10.5 mg/dL Final   Hgb A1c MFr Bld 10/17/2021 7.2 (H)  4.6 - 6.5 % Final   Glycemic Control Guidelines for People with Diabetes:Non Diabetic:  <6%Goal of Therapy: <7%Additional Action Suggested:  >8%     Physical Examination:  BP 132/86 (BP Location: Left Arm, Patient Position: Sitting, Cuff Size: Normal)   Pulse 76   Ht 5' 0.5" (1.537 m)   Wt 128 lb (58.1 kg)   SpO2 98%   BMI 24.59 kg/m        ASSESSMENT:  Diabetes type 2 nonobese  See history of present illness for detailed discussion of current diabetes management, blood sugar patterns and problems identified  A1c is at 7.3  Current treatment regimen is Metformin 1000 mg, Farxiga 10 mg and Prandin 4 mg, 3 times daily  Her blood glucose patterns were reviewed in detail from her freestyle [employer]  She has had significantly higher blood sugars after breakfast but occasionally after dinner only Otherwise blood sugars are excellent and may be low normal However not clear if her freestyle Elenor Legato is accurate as seen in the office today She has fairly balanced meals but her blood sugars tend to rise after breakfast possibly because of eating relatively high carbohydrate meal  Her time in target on her CGM is still excellent at 84% on her last 2-week download She is quite consistent with her medications as above Also exercising  WEIGHT loss and decreased appetite: Unclear of the etiology Unlikely this is from [place] as she has been on this for a  while    PLAN:    She will need to modify her diet a little better with cutting back on one of her bread slices in the morning and adding a protein like cottage cheese Since her blood sugars are falsely low when they are in the upper 60s she will not treat these  We will try to get her the Dexcom sensor as this is more accurate and her Elenor Legato is reading falsely low, unlikely that [place] 3 is covered  Take Metformin 2 pills with breakfast only instead of the evening  Continue Prandin 2 tablets a few minutes before breakfast, lunch and dinner  She will need to lower her target for low blood sugar alarm on her sensor reader to 65 and this was done for her in the office  Take 1/2 Farxiga 10 mg daily instead of the full tablet  to avoid weight loss and switch to Invokana when finished  Next Rx will be 100 mg Invokana to replace Wilder Glade, will send prescription to local pharmacy first  Call Dr Ethelene Hal about weight   Patient Instructions  Take Metformin 2 pills with breakfast only Continue Prandin 2 tablets a few minutes before breakfast, lunch and dinner  Take 1/2 Farxiga 10 mg daily with Prandin before breakfast  Next Rx will be Invokana to replace [place]  Call Dr Ethelene Hal about weight  Total visit time including counseling = 30 minutes      Elayne Snare 10/20/2021, 2:03 PM   Note: This office note was prepared with Dragon voice recognition system technology. Any transcriptional errors that result from this process are unintentional.

## 2021-10-20 NOTE — Patient Instructions (Addendum)
Take Metformin 2 pills with breakfast only Continue Prandin 2 tablets a few minutes before breakfast, lunch and dinner  Take 1/2 Farxiga 10 mg daily with Prandin before breakfast  Next Rx will be Invokana to replace [place]  Call Dr Doreene Burke about weight

## 2021-10-23 ENCOUNTER — Encounter: Payer: Self-pay | Admitting: General Practice

## 2021-10-24 ENCOUNTER — Encounter: Payer: Self-pay | Admitting: Endocrinology

## 2021-10-27 ENCOUNTER — Ambulatory Visit (HOSPITAL_BASED_OUTPATIENT_CLINIC_OR_DEPARTMENT_OTHER): Payer: BC Managed Care – PPO | Admitting: Cardiology

## 2021-11-11 ENCOUNTER — Telehealth: Payer: Self-pay | Admitting: Family Medicine

## 2021-11-11 DIAGNOSIS — M25562 Pain in left knee: Secondary | ICD-10-CM

## 2021-11-11 NOTE — Telephone Encounter (Signed)
Caller Name: Leigh Blas Call back phone #: [phone]  Reason for Call: Pt fell down stairs yesterday (8/07), refused to speak to anyone except Dr. Evangeline Gula cna. She does not want to make an appt to come in until she has spoken to [facility] and Dr. Doreene Burke

## 2021-11-11 NOTE — Telephone Encounter (Signed)
Spoke with patient who states that she fell down the steps at home earlier today she does not feel like she broke anything but she is having a little pain in her left leg and knee. No swelling. Appointment scheduled next available appointment with Dr. Doreene Burke per patients request she would only like to see Dr. Doreene Burke. Patient verbally understood if symptoms become worse she will need to go to ED.

## 2021-11-13 ENCOUNTER — Ambulatory Visit (INDEPENDENT_AMBULATORY_CARE_PROVIDER_SITE_OTHER): Payer: Medicare HMO

## 2021-11-13 ENCOUNTER — Encounter: Payer: Self-pay | Admitting: Family Medicine

## 2021-11-13 ENCOUNTER — Ambulatory Visit (INDEPENDENT_AMBULATORY_CARE_PROVIDER_SITE_OTHER): Payer: Medicare HMO | Admitting: Family Medicine

## 2021-11-13 VITALS — BP 122/82 | HR 67 | Temp 97.5°F | Wt 125.0 lb

## 2021-11-13 DIAGNOSIS — S86912A Strain of unspecified muscle(s) and tendon(s) at lower leg level, left leg, initial encounter: Secondary | ICD-10-CM

## 2021-11-13 DIAGNOSIS — M25562 Pain in left knee: Secondary | ICD-10-CM | POA: Diagnosis not present

## 2021-11-13 DIAGNOSIS — F341 Dysthymic disorder: Secondary | ICD-10-CM

## 2021-11-13 DIAGNOSIS — M25462 Effusion, left knee: Secondary | ICD-10-CM | POA: Diagnosis not present

## 2021-11-13 NOTE — Progress Notes (Signed)
Established Patient Office Visit  Subjective   Patient ID: Marie [name], female    DOB: [birth year]  Age: 66 y.o. MRN: [id number]  Chief Complaint  Patient presents with   Leg Pain    Left knee and leg pain due to fall down the stairs on Sunday. Would like an x-ray today.     Leg Pain    fell 4 days ago while going down the steps at home.  She is not exactly sure what happened.  She did not slip or trip.  She did not lose consciousness.  There were no palpitations.  She has fallen before.  She is reluctant to leave her current home to a single-story dwelling because her mentally challenged son would not adapt well, she thinks.  Knee is swollen and painful.  It is improving.  Doing well with the Lexapro.  Feels a little bit more relaxed and at ease.  Would like to continue it.    Review of Systems  Constitutional:  Negative for chills, diaphoresis, malaise/fatigue and weight loss.  HENT: Negative.    Eyes: Negative.  Negative for blurred vision and double vision.  Respiratory: Negative.    Cardiovascular:  Negative for chest pain and palpitations.  Gastrointestinal:  Negative for abdominal pain.  Genitourinary: Negative.   Musculoskeletal:  Positive for falls and joint pain. Negative for myalgias.  Neurological:  Negative for dizziness, speech change, loss of consciousness, weakness and headaches.  Psychiatric/Behavioral: Negative.        Objective:     BP 122/82 (BP Location: Left Arm, Patient Position: Sitting, Cuff Size: Large)   Pulse 67   Temp (!) 97.5 F (36.4 C) (Temporal)   Wt 125 lb (56.7 kg)   SpO2 98%   BMI 24.41 kg/m    Physical Exam Constitutional:      General: She is not in acute distress.    Appearance: Normal appearance. She is not ill-appearing, toxic-appearing or diaphoretic.  HENT:     Head: Normocephalic and atraumatic.     Right Ear: External ear normal.     Left Ear: External ear normal.     Mouth/Throat:     Mouth: Mucous membranes are  moist.     Pharynx: Oropharynx is clear. No oropharyngeal exudate or posterior oropharyngeal erythema.  Eyes:     General: No scleral icterus.       Right eye: No discharge.        Left eye: No discharge.     Extraocular Movements: Extraocular movements intact.     Conjunctiva/sclera: Conjunctivae normal.     Pupils: Pupils are equal, round, and reactive to light.  Cardiovascular:     Rate and Rhythm: Normal rate and regular rhythm.  Pulmonary:     Effort: Pulmonary effort is normal. No respiratory distress.     Breath sounds: Normal breath sounds.  Abdominal:     Tenderness: There is no guarding.  Musculoskeletal:     Left knee: Swelling and effusion present. Normal range of motion. Tenderness present.       Legs:  Skin:    General: Skin is warm and dry.  Neurological:     Mental Status: She is alert and oriented to person, place, and time.  Psychiatric:        Mood and Affect: Mood normal.        Behavior: Behavior normal.      No results found for any visits on 11/13/21.    The ASCVD Risk score (  Arnett DK, et al., 2019) failed to calculate for the following reasons:   The valid total cholesterol range is 130 to 320 mg/dL    Assessment & Plan:   Problem List Items Addressed This Visit   None Visit Diagnoses     Strain of left knee, initial encounter    -  Primary   Relevant Orders   DG Knee Complete 4 Views Left   Dysthymia           Return in about 4 weeks (around 12/11/2021), or continue Lexapro. Use tylenol as needed for pain..  Continue Lexapro.  Discussed moving to a single story dwelling.  She is reluctant.  Family is also urging her to do so.  Believe that her visual impairment has a lot to do with her falls.  Mliss Sax, MD

## 2021-11-13 NOTE — Progress Notes (Unsigned)
Initial visit for Left knee pain after falling down her steps 4 days ago.

## 2021-11-17 NOTE — Telephone Encounter (Signed)
Pt is wanting a cb concerning her most recent x-ray she has had done, CB [phone]

## 2021-11-18 ENCOUNTER — Telehealth: Payer: Self-pay | Admitting: Family Medicine

## 2021-11-18 NOTE — Telephone Encounter (Signed)
Patient message sent to Provider to view xray. Patient aware someone will call with results.

## 2021-11-18 NOTE — Telephone Encounter (Signed)
Patient calling to go over xray results. Please advise  

## 2021-11-18 NOTE — Telephone Encounter (Signed)
Pt would like to hear the results from her xray.

## 2021-11-19 NOTE — Telephone Encounter (Signed)
Patient aware of message below.

## 2021-11-21 ENCOUNTER — Telehealth: Payer: Self-pay | Admitting: Family Medicine

## 2021-11-21 NOTE — Telephone Encounter (Signed)
Caller Name: Newell Frater Call back phone #: [phone]  Reason for Call: Please call pt. She has heard about the new covid strand and is worried. Would like Dr. Evangeline Gula advise on whether she should get another covid shot and if she should get the flu shot.

## 2021-11-26 ENCOUNTER — Ambulatory Visit: Payer: Medicare HMO | Admitting: Family Medicine

## 2021-11-26 NOTE — Progress Notes (Deleted)
   I, Philbert Riser, LAT, ATC acting as a scribe for Clementeen Graham, MD.  Subjective:    CC: L knee pain  HPI: Pt is a 66 y/o female c/o L knee pain ongoing since Sunday, 8/17. Pt suffered a fall down the stairs, is unsure of what specifically happened. Pt locates pain to   L Knee swelling: Mechanical symptoms: Radiates: Aggravates: Treatments tried:  Dx imaging: 11/13/21 L knee XR  Pertinent review of Systems: ***  Relevant historical information: ***   Objective:   There were no vitals filed for this visit. General: Well Developed, well nourished, and in no acute distress.   MSK: ***  Lab and Radiology Results No results found for this or any previous visit (from the past 72 hour(s)). No results found.    Impression and Recommendations:    Assessment and Plan: 66 y.o. female with ***.  PDMP not reviewed this encounter. No orders of the defined types were placed in this encounter.  No orders of the defined types were placed in this encounter.   Discussed warning signs or symptoms. Please see discharge instructions. Patient expresses understanding.   ***

## 2021-11-26 NOTE — Telephone Encounter (Signed)
Spoke with patient who will have flu vaccine at next appointment and call pharmacy to schedule COVID booster.

## 2021-12-01 ENCOUNTER — Encounter (INDEPENDENT_AMBULATORY_CARE_PROVIDER_SITE_OTHER): Payer: Self-pay | Admitting: Ophthalmology

## 2021-12-01 ENCOUNTER — Ambulatory Visit (INDEPENDENT_AMBULATORY_CARE_PROVIDER_SITE_OTHER): Payer: Medicare HMO | Admitting: Ophthalmology

## 2021-12-01 DIAGNOSIS — E113411 Type 2 diabetes mellitus with severe nonproliferative diabetic retinopathy with macular edema, right eye: Secondary | ICD-10-CM

## 2021-12-01 DIAGNOSIS — H2512 Age-related nuclear cataract, left eye: Secondary | ICD-10-CM

## 2021-12-01 DIAGNOSIS — E113492 Type 2 diabetes mellitus with severe nonproliferative diabetic retinopathy without macular edema, left eye: Secondary | ICD-10-CM

## 2021-12-01 NOTE — Progress Notes (Signed)
12/01/2021     CHIEF COMPLAINT Patient presents for  Chief Complaint  Patient presents with   Diabetic Retinopathy with Macular Edema      HISTORY OF PRESENT ILLNESS: Marie [name] is a 66 y.o. female who presents to the clinic today for:   HPI   4 MOS for DILATE OU, OCT, COLOR FP. Pt stated, "When I am looking at my phone or computer, after a few seconds everything gets blurry. But when I'm looking at a light or sun, my vision comes back. I also have to rub my eyes often because its watery and I feel like it accumulates the tears." Pt denies pain.  Last edited by Angeline Slim on 12/01/2021  2:33 PM.      Referring physician: Mliss Sax, MD [address] [place],  [place] [address]  HISTORICAL INFORMATION:   Selected notes from the MEDICAL RECORD NUMBER    Lab Results  Component Value Date   HGBA1C 7.2 (H) 10/17/2021     CURRENT MEDICATIONS: No current outpatient medications on file. (Ophthalmic Drugs)   No current facility-administered medications for this visit. (Ophthalmic Drugs)   Current Outpatient Medications (Other)  Medication Sig   aspirin EC 81 MG tablet Take 81 mg by mouth at bedtime. (Patient not taking: Reported on 10/20/2021)   atorvastatin (LIPITOR) 80 MG tablet TAKE 1 TABLET(80 MG) BY MOUTH DAILY   canagliflozin (INVOKANA) 100 MG TABS tablet 1 tablet before breakfast   Cholecalciferol (VITAMIN D3) 3000 units TABS Take 3,000 Units by mouth at bedtime.   Continuous Blood Gluc Receiver (DEXCOM G6 RECEIVER) DEVI Use to display glucose from sensor   Continuous Blood Gluc Sensor (DEXCOM G6 SENSOR) MISC Use to monitor blood sugar, change after 10 days   Continuous Blood Gluc Sensor (FREESTYLE LIBRE 2 SENSOR) MISC 2 Devices by Does not apply route every 14 (fourteen) days.   escitalopram (LEXAPRO) 10 MG tablet Take 1 tablet (10 mg total) by mouth daily.   glucose blood test strip 1 each by Other route as needed for other. Use as instructed    lisinopril (ZESTRIL) 40 MG tablet Take 1 tablet (40 mg total) by mouth daily.   metFORMIN (GLUCOPHAGE-XR) 500 MG 24 hr tablet TAKE 2 TABLETS(1000 MG) BY MOUTH IN THE MORNING AND AT BEDTIME   Multiple Vitamin (MULTIVITAMIN WITH MINERALS) TABS tablet Take 1 tablet by mouth daily.   repaglinide (PRANDIN) 2 MG tablet TAKE 2 TABLETS BY MOUTH  BEFORE BREAKFAST , 1 TABLET AT LUNCH AND 1 TABLET  BEFORE DINNER   No current facility-administered medications for this visit. (Other)      REVIEW OF SYSTEMS: ROS   Negative for: Constitutional, Gastrointestinal, Neurological, Skin, Genitourinary, Musculoskeletal, HENT, Endocrine, Cardiovascular, Eyes, Respiratory, Psychiatric, Allergic/Imm, Heme/Lymph Last edited by Angeline Slim on 12/01/2021  2:33 PM.       ALLERGIES Allergies  Allergen Reactions   Penicillins Other (See Comments)    Driving couldn't see and head felt heavy  Has patient had a PCN reaction causing immediate rash, facial/tongue/throat swelling, SOB or lightheadedness with hypotension: No Has patient had a PCN reaction causing severe rash involving mucus membranes or skin necrosis: No Has patient had a PCN reaction that required hospitalization: No Has patient had a PCN reaction occurring within the last 10 years: No If all of the above answers are "NO", then may proceed with Cephalosporin use.     PAST MEDICAL HISTORY Past Medical History:  Diagnosis Date   CVA (cerebral  vascular accident) (HCC) 2015   Depression    Diabetes (HCC)    t2   Gallstones    Hyperlipemia    Hypertension    Migraines    Past Surgical History:  Procedure Laterality Date   BREAST BIOPSY Right 05/2019   CESAREAN SECTION     x2   CHOLECYSTECTOMY N/A 05/25/2016   Procedure: LAPAROSCOPIC CHOLECYSTECTOMY WITH INTRAOPERATIVE CHOLANGIOGRAM;  Surgeon: Luretha Murphy, MD;  Location: [facility] ORS;  Service: General;  Laterality: N/A;   IR GENERIC HISTORICAL  03/13/2016   IR PERC CHOLECYSTOSTOMY 03/13/2016 [name], MD [facility]INTER[facility] RAD   IR GENERIC HISTORICAL  04/28/2016   IR RADIOLOGIST EVAL & MGMT 04/28/2016 [name], MD [facility]   LOOP RECORDER IMPLANT N/A 11/29/2013   Procedure: LOOP RECORDER IMPLANT;  Surgeon: Marinus Maw, MD;  Location: [facility] CATH LAB;  Service: Cardiovascular;  Laterality: N/A;   TEE WITHOUT CARDIOVERSION N/A 08/24/2013   Procedure: TRANSESOPHAGEAL ECHOCARDIOGRAM (TEE);  Surgeon: [name], MD;  Location: [facility] ENDOSCOPY;  Service: Cardiovascular;  Laterality: N/A;    FAMILY HISTORY Family History  Problem Relation Age of Onset   Stomach cancer Mother    High blood pressure Mother    Diabetes Mother    Diabetes Maternal Grandfather    High blood pressure Maternal Grandfather    Heart disease Neg Hx     SOCIAL HISTORY Social History   Tobacco Use   Smoking status: Never   Smokeless tobacco: Never  Vaping Use   Vaping Use: Never used  Substance Use Topics   Alcohol use: No    Alcohol/week: 1.0 standard drink of alcohol    Types: 1 Standard drinks or equivalent per week    Comment: Social   Drug use: No         OPHTHALMIC EXAM:  Base Eye Exam     Visual Acuity (ETDRS)       Right Left   Dist [place] 20/150 -1 20/25 -1   Dist ph [place] 20/80          Tonometry (Tonopen, 2:38 PM)       Right Left   Pressure 16 17         Pupils       Pupils APD   Right PERRL None   Left PERRL None         Visual Fields       Left Right    Full Full         Extraocular Movement       Right Left    Full Full         Neuro/Psych     Oriented x3: Yes   Mood/Affect: Normal         Dilation     Both eyes: 1.0% Mydriacyl, 2.5% Phenylephrine @ 2:38 PM           Slit Lamp and Fundus Exam     External Exam       Right Left   External Normal Normal         Slit Lamp Exam       Right Left   Lids/Lashes Normal Normal   Conjunctiva/Sclera White and quiet White and quiet   Cornea Clear Clear   Anterior Chamber Deep  and quiet Deep and quiet   Iris Round and reactive Round and reactive   Lens Posterior chamber intraocular lens, , 2+ Posterior capsular opacification 2+ Nuclear sclerosis, Cortical cataract   Anterior Vitreous  Normal Normal         Fundus Exam       Right Left   Posterior Vitreous Posterior vitreous detachment, Central vitreous floaters, Iluvien implant visualized in the inferotemporal quadrant after injection procedure completed    Disc Normal    C/D Ratio 0.5    Macula Focal laser scars, Microaneurysms, much less, no discernible thickening    Vessels Retinopathy,, severe NPDR    Periphery Normal, good prp, room 360, for more laser in the future, posteriorly, and nasally and inferonasally some room anteriorly as well             IMAGING AND PROCEDURES  Imaging and Procedures for 12/01/21  OCT, Retina - OU - Both Eyes       Right Eye Quality was good. Scan locations included subfoveal. Central Foveal Thickness: 487. Progression has worsened. Findings include abnormal foveal contour, cystoid macular edema.   Left Eye Scan locations included subfoveal. Central Foveal Thickness: 304. Findings include abnormal foveal contour.   Notes  Now  12 months status post injection intravitreal Iluvien for long-term steroid slow release OD for CSME continued vast improvement in CSME OD yet some lingering CME noted.  Mild ERM, center involved CME  OS stable overall, with micro CME not progressive      Color Fundus Photography Optos - OU - Both Eyes       Right Eye Progression has worsened. Disc findings include normal observations. Macula : edema, microaneurysms.   Left Eye Progression has been stable. Disc findings include normal observations. Macula : normal observations. Vessels : normal observations. Periphery : normal observations.   Notes Good PRP nearly 360, some small region inferonasal and superotemporal and anteriorly inferotemporal , available for more laser but  clear media at this time with quiet PDR, decreased CSME overall, implant not seen   OS with severe NPDR              ASSESSMENT/PLAN:  Severe nonproliferative diabetic retinopathy of right eye, with macular edema, associated with type 2 diabetes mellitus (HCC) OD overall doing very well with this smoldering CME residual OD but normal intraocular pressures post Iluvien implant 1 year previous.  We will really evaluate again in 6 months  Severe nonproliferative diabetic retinopathy of left eye (HCC) OS stable reevaluate in 6 months  Nuclear sclerotic cataract of left eye Progression accounting for imaging of vision and needing an extra light to see     ICD-10-CM   1. Severe nonproliferative diabetic retinopathy of right eye, with macular edema, associated with type 2 diabetes mellitus (HCC)  E11.3411 OCT, Retina - OU - Both Eyes    Color Fundus Photography Optos - OU - Both Eyes    2. Severe nonproliferative diabetic retinopathy of left eye without macular edema associated with type 2 diabetes mellitus (HCC)  E31.5400     3. Nuclear sclerotic cataract of left eye  H25.12       1.  OS stable over time monitor severe NPDR  2.  OD, PDR with persistent CSME resistant to all antivegF agents, but sensitive to Ozurdex in the past now 1 year post Iluvien implant OD.  We will continue to monitor and observe.  Normal intraocular pressures maintain  3.  Cataract left eye accounts for some visual acuity symptoms  Ophthalmic Meds Ordered this visit:  No orders of the defined types were placed in this encounter.      Return in about 6 months (around 06/03/2022) for DILATE  OU, OCT, COLOR FP.  There are no Patient Instructions on file for this visit.   Explained the diagnoses, plan, and follow up with the patient and they expressed understanding.  Patient expressed understanding of the importance of proper follow up care.   Alford Highland Tawyna Pellot M.D. Diseases & Surgery of the Retina and  [facility] 12/01/21     Abbreviations: M myopia (nearsighted); A astigmatism; H hyperopia (farsighted); P presbyopia; Mrx spectacle prescription;  CTL contact lenses; OD right eye; OS left eye; OU both eyes  XT exotropia; ET esotropia; PEK punctate epithelial keratitis; PEE punctate epithelial erosions; DES dry eye syndrome; MGD meibomian gland dysfunction; ATs artificial tears; PFAT's preservative free artificial tears; NSC nuclear sclerotic cataract; PSC posterior subcapsular cataract; ERM epi-retinal membrane; PVD posterior vitreous detachment; RD retinal detachment; DM diabetes mellitus; DR diabetic retinopathy; NPDR non-proliferative diabetic retinopathy; PDR proliferative diabetic retinopathy; CSME clinically significant macular edema; DME diabetic macular edema; dbh dot blot hemorrhages; CWS cotton wool spot; POAG primary open angle glaucoma; C/D cup-to-disc ratio; HVF humphrey visual field; GVF goldmann visual field; OCT optical coherence tomography; IOP intraocular pressure; BRVO Branch retinal vein occlusion; CRVO central retinal vein occlusion; CRAO central retinal artery occlusion; BRAO branch retinal artery occlusion; RT retinal tear; SB scleral buckle; PPV pars plana vitrectomy; VH Vitreous hemorrhage; PRP panretinal laser photocoagulation; IVK intravitreal kenalog; VMT vitreomacular traction; MH Macular hole;  NVD neovascularization of the disc; NVE neovascularization elsewhere; AREDS age related eye disease study; ARMD age related macular degeneration; POAG primary open angle glaucoma; EBMD epithelial/anterior basement membrane dystrophy; ACIOL anterior chamber intraocular lens; IOL intraocular lens; PCIOL posterior chamber intraocular lens; Phaco/IOL phacoemulsification with intraocular lens placement; PRK photorefractive keratectomy; LASIK laser assisted in situ keratomileusis; HTN hypertension; DM diabetes mellitus; COPD chronic obstructive pulmonary disease

## 2021-12-01 NOTE — Assessment & Plan Note (Signed)
OD overall doing very well with this smoldering CME residual OD but normal intraocular pressures post Iluvien implant 1 year previous.  We will really evaluate again in 6 months

## 2021-12-01 NOTE — Assessment & Plan Note (Signed)
Progression accounting for imaging of vision and needing an extra light to see

## 2021-12-01 NOTE — Assessment & Plan Note (Signed)
OS stable reevaluate in 6 months

## 2021-12-05 ENCOUNTER — Ambulatory Visit: Payer: Medicare HMO | Admitting: Family Medicine

## 2021-12-09 ENCOUNTER — Encounter: Payer: Self-pay | Admitting: Family Medicine

## 2021-12-09 ENCOUNTER — Ambulatory Visit (INDEPENDENT_AMBULATORY_CARE_PROVIDER_SITE_OTHER): Payer: Medicare HMO | Admitting: Family Medicine

## 2021-12-09 VITALS — BP 122/70 | HR 94 | Temp 97.7°F | Ht 60.0 in | Wt 124.6 lb

## 2021-12-09 DIAGNOSIS — F341 Dysthymic disorder: Secondary | ICD-10-CM | POA: Diagnosis not present

## 2021-12-09 DIAGNOSIS — E7800 Pure hypercholesterolemia, unspecified: Secondary | ICD-10-CM | POA: Diagnosis not present

## 2021-12-09 DIAGNOSIS — F32A Depression, unspecified: Secondary | ICD-10-CM | POA: Insufficient documentation

## 2021-12-09 MED ORDER — ATORVASTATIN CALCIUM 80 MG PO TABS: ORAL_TABLET | ORAL | 3 refills | Status: DC

## 2021-12-09 MED ORDER — ESCITALOPRAM OXALATE 10 MG PO TABS: 10.0000 mg | ORAL_TABLET | Freq: Every day | ORAL | 1 refills | Status: DC

## 2021-12-09 NOTE — Progress Notes (Signed)
Established Patient Office Visit  Subjective   Patient ID: Marie [name], female    DOB: [birth year]  Age: 66 y.o. MRN: [id number]  Chief Complaint  Patient presents with   Follow-up    6 week follow up on medication,     HPI for follow-up of dysthymia treated with Lexapro.  Patient feels as though mood has improved along with her sleep and anxiety levels.  She would like to continue the medication.  Continues with high-dose atorvastatin for treatment of elevated LDL cholesterol associated with CVA.  LDL measured back in April was 58.  She is tolerating the drug well.    Review of Systems  Constitutional: Negative.   HENT: Negative.    Eyes:  Negative for blurred vision, discharge and redness.  Respiratory: Negative.    Cardiovascular: Negative.   Gastrointestinal:  Negative for abdominal pain.  Genitourinary: Negative.   Musculoskeletal: Negative.  Negative for myalgias.  Skin:  Negative for rash.  Neurological:  Negative for tingling, loss of consciousness and weakness.  Endo/Heme/Allergies:  Negative for polydipsia.  Psychiatric/Behavioral:  Negative for depression. The patient is not nervous/anxious and does not have insomnia.       12/09/2021    3:07 PM 11/13/2021    2:17 PM 10/20/2021    4:07 PM  Depression screen PHQ 2/9  Decreased Interest 0 2 0  Down, Depressed, Hopeless 0 1 0  PHQ - 2 Score 0 3 0  Altered sleeping  1   Tired, decreased energy  1   Change in appetite  3   Feeling bad or failure about yourself   0   Trouble concentrating  0   Moving slowly or fidgety/restless  0   Suicidal thoughts  0   PHQ-9 Score  8   Difficult doing work/chores Not difficult at all Not difficult at all        Objective:     BP 122/70 (BP Location: Left Arm, Patient Position: Sitting, Cuff Size: Normal)   Pulse 94   Temp 97.7 F (36.5 C) (Temporal)   Ht 5' (1.524 m)   Wt 124 lb 9.6 oz (56.5 kg)   SpO2 98%   BMI 24.33 kg/m    Physical Exam Constitutional:       General: She is not in acute distress.    Appearance: Normal appearance. She is not ill-appearing, toxic-appearing or diaphoretic.  HENT:     Head: Normocephalic and atraumatic.     Right Ear: External ear normal.     Left Ear: External ear normal.     Mouth/Throat:     Mouth: Mucous membranes are moist.     Pharynx: Oropharynx is clear. No oropharyngeal exudate or posterior oropharyngeal erythema.  Eyes:     General: No scleral icterus.       Right eye: No discharge.        Left eye: No discharge.     Extraocular Movements: Extraocular movements intact.     Conjunctiva/sclera: Conjunctivae normal.  Pulmonary:     Effort: Pulmonary effort is normal. No respiratory distress.  Skin:    General: Skin is warm and dry.  Neurological:     Mental Status: She is alert and oriented to person, place, and time.  Psychiatric:        Mood and Affect: Mood normal.        Behavior: Behavior normal.      No results found for any visits on 12/09/21.    The ASCVD  Risk score (Arnett DK, et al., 2019) failed to calculate for the following reasons:   The valid total cholesterol range is 130 to 320 mg/dL    Assessment & Plan:   Problem List Items Addressed This Visit       Other   Elevated LDL cholesterol level   Relevant Medications   atorvastatin (LIPITOR) 80 MG tablet   Dysthymia - Primary   Relevant Medications   escitalopram (LEXAPRO) 10 MG tablet    Return in about 3 months (around 03/10/2022).    Mliss Sax, MD

## 2021-12-17 ENCOUNTER — Ambulatory Visit (HOSPITAL_BASED_OUTPATIENT_CLINIC_OR_DEPARTMENT_OTHER): Payer: BC Managed Care – PPO | Admitting: Cardiology

## 2021-12-19 ENCOUNTER — Ambulatory Visit (INDEPENDENT_AMBULATORY_CARE_PROVIDER_SITE_OTHER): Payer: Medicare HMO

## 2021-12-19 ENCOUNTER — Encounter: Payer: Self-pay | Admitting: Family Medicine

## 2021-12-19 DIAGNOSIS — Z23 Encounter for immunization: Secondary | ICD-10-CM

## 2021-12-19 NOTE — Progress Notes (Signed)
..  After obtaining consent, and per orders of Dr. Doreene Burke, injection of Influenza Vaccine given by Philipp Deputy. Patient tolerated injection well, and to report any adverse reaction to me immediately.

## 2021-12-22 ENCOUNTER — Other Ambulatory Visit: Payer: Self-pay | Admitting: Family Medicine

## 2021-12-22 DIAGNOSIS — I10 Essential (primary) hypertension: Secondary | ICD-10-CM

## 2021-12-25 ENCOUNTER — Other Ambulatory Visit: Payer: Self-pay | Admitting: Family Medicine

## 2021-12-25 DIAGNOSIS — E11319 Type 2 diabetes mellitus with unspecified diabetic retinopathy without macular edema: Secondary | ICD-10-CM

## 2021-12-30 ENCOUNTER — Other Ambulatory Visit: Payer: Self-pay | Admitting: Family Medicine

## 2021-12-30 DIAGNOSIS — F341 Dysthymic disorder: Secondary | ICD-10-CM

## 2021-12-31 ENCOUNTER — Ambulatory Visit: Payer: Medicare HMO | Admitting: Obstetrics & Gynecology

## 2022-01-06 ENCOUNTER — Telehealth: Payer: Self-pay

## 2022-01-06 NOTE — Telephone Encounter (Signed)
Patient called in and wants upgrade to [place] system. Tried calling patient back as medicare is not allowing upgrades at this time. Voicemail full not able to leave message.

## 2022-01-08 DIAGNOSIS — E119 Type 2 diabetes mellitus without complications: Secondary | ICD-10-CM | POA: Diagnosis not present

## 2022-01-09 ENCOUNTER — Telehealth: Payer: Self-pay | Admitting: Family Medicine

## 2022-01-09 NOTE — Telephone Encounter (Signed)
Pt called and would like for you to give her a call

## 2022-01-09 NOTE — Telephone Encounter (Signed)
Patient called will have labs at PCP. Informed patient that her insurance is not allowing upgrades to [place] [facility] at this time.

## 2022-01-09 NOTE — Telephone Encounter (Signed)
Patient came into office was needing to be scheduled for labs.

## 2022-01-14 ENCOUNTER — Telehealth: Payer: Self-pay

## 2022-01-14 NOTE — Patient Outreach (Signed)
  Care Coordination   Initial Visit Note   01/14/2022 Name: Marie [name] MRN: [id number] DOB: 09-Oct-1955  Marie [name] is a 66 y.o. year old female who sees Libby Maw, MD for primary care. I spoke with  [name] by phone today.  What matters to the patients health and wellness today?  Diabetes Management    Goals Addressed             This Visit's Progress    Diabetes Management       Care Coordination Interventions: Provided education to patient about basic DM disease process Reviewed medications with patient and discussed importance of medication adherence Discussed plans with patient for ongoing care management follow up and provided patient with direct contact information for care management team Review of patient status, including review of consultants reports, relevant laboratory and other test results, and medications completed Screening for signs and symptoms of depression related to chronic disease state           SDOH assessments and interventions completed:  Yes     Care Coordination Interventions Activated:  Yes  Care Coordination Interventions:  Yes, provided   Follow up plan: Follow up call scheduled for December    Encounter Outcome:  Pt. Visit Completed   Jone Baseman, RN, MSN [facility] Care Management Coordinator Direct Line [phone]

## 2022-01-14 NOTE — Patient Instructions (Signed)
Visit Information  Thank you for taking time to visit with me today. Please don't hesitate to contact me if I can be of assistance to you.   Following are the goals we discussed today:   Goals Addressed             This Visit's Progress    Diabetes Management       Care Coordination Interventions: Provided education to patient about basic DM disease process Reviewed medications with patient and discussed importance of medication adherence Discussed plans with patient for ongoing care management follow up and provided patient with direct contact information for care management team Review of patient status, including review of consultants reports, relevant laboratory and other test results, and medications completed Screening for signs and symptoms of depression related to chronic disease state           Our next appointment is by telephone on 03/11/22 at 10 am  Please call the care guide team at [phone] if you need to cancel or reschedule your appointment.   If you are experiencing a Mental Health or [place] or need someone to talk to, please call the Suicide and Crisis Lifeline: 988   The patient verbalized understanding of instructions, educational materials, and care plan provided today and agreed to receive a mailed copy of patient instructions, educational materials, and care plan.   Telephone follow up appointment with care management team member scheduled for: December   Hamza Empson J Shaeleigh Graw, [place], MSN [facility] Care Management Coordinator Direct Line [phone]

## 2022-01-16 ENCOUNTER — Other Ambulatory Visit (INDEPENDENT_AMBULATORY_CARE_PROVIDER_SITE_OTHER): Payer: Medicare HMO

## 2022-01-16 DIAGNOSIS — E1165 Type 2 diabetes mellitus with hyperglycemia: Secondary | ICD-10-CM | POA: Diagnosis not present

## 2022-01-16 LAB — BASIC METABOLIC PANEL
BUN: 14 mg/dL (ref 6–23)
CO2: 29 mEq/L (ref 19–32)
Calcium: 9.1 mg/dL (ref 8.4–10.5)
Chloride: 102 mEq/L (ref 96–112)
Creatinine, Ser: 0.83 mg/dL (ref 0.40–1.20)
GFR: 72.83 mL/min (ref >60.00)
Glucose, Bld: 201 mg/dL — ABNORMAL HIGH (ref 70–99)
Potassium: 3.8 mEq/L (ref 3.5–5.1)
Sodium: 140 mEq/L (ref 135–145)

## 2022-01-16 LAB — HEMOGLOBIN A1C: Hgb A1c MFr Bld: 6.9 % — ABNORMAL HIGH (ref 4.6–6.5)

## 2022-01-19 ENCOUNTER — Ambulatory Visit: Payer: Medicare HMO | Admitting: Endocrinology

## 2022-01-19 ENCOUNTER — Encounter: Payer: Self-pay | Admitting: Endocrinology

## 2022-01-19 VITALS — BP 166/84 | HR 85 | Ht 62.0 in | Wt 127.2 lb

## 2022-01-19 DIAGNOSIS — I10 Essential (primary) hypertension: Secondary | ICD-10-CM

## 2022-01-19 DIAGNOSIS — E1165 Type 2 diabetes mellitus with hyperglycemia: Secondary | ICD-10-CM | POA: Diagnosis not present

## 2022-01-19 MED ORDER — DAPAGLIFLOZIN PROPANEDIOL 10 MG PO TABS: 10.0000 mg | ORAL_TABLET | Freq: Every day | ORAL | 3 refills | Status: DC

## 2022-01-19 NOTE — Progress Notes (Signed)
Patient ID: Marie [name], female   DOB: Jun 16, 1955, 66 y.o.   MRN: [id number]           Reason for Appointment: Follow-up for Type 2 Diabetes   History of Present Illness:          Date of diagnosis of type 2 diabetes mellitus: 2010       Background history:   She was likely treated with Metformin alone for the first few years and subsequently given Januvia In 2018 she was also given [place] in addition Current records indicate her highest A1c was 9.6 in 03/2017.  Has been progressively improving since then She was also started on Rybelsus in 02/2019  Recent history:   A1c is 6.9  Non-insulin hypoglycemic drugs the patient is taking are: Metformin 1.0 g daily, Farxiga 10 mg at night, Prandin 4-2-4 mg before meals    Current management, blood sugar patterns and problems identified  Her blood sugar in the lab was 201 and periodically will go up significantly high after breakfast and rarely after other meals She was tried on Invokana to see if it worked better but she is complaining about the cost especially in the donut hole, she is still taking this A1c is better even though her time in range is about the same at home  Unclear whether her insurance has denied her [place] 3 She does take her Prandin a few minutes before eating as directed, generally only 1 tablet at lunch Previously was concerned about her weight loss but now this is improved Again overnight blood sugars are generally fairly good She did cut back on her carbohydrate to 1 slice of bread in the morning and is avoiding orange juice but still has normal blood sugars spiking after breakfast time Occasionally may have low normal readings before breakfast She has been regular with her exercise on her home equipment        Side effects from medications have been: Decreased appetite from Rybelsus   Typical meal intake: Breakfast is egg, toast .              Exercise:  Exercise equipment, ? elliptical, using for about 20  min daily   Glucose monitoring:  Using freestyle libre   Data for the last 2 weeks  Interpretation of the download as follows  LOWEST blood sugars are early morning before 6 AM and highest between 10 AM-12 PM Data may be somewhat inadequate in the evenings around 8 PM Overnight blood sugars are fairly good in the low 100 range after about midnight but started going up after about 7 AM  Premeal blood sugars are mildly increased at dinnertime around 136 but difficult to judge what is this her lunchtime POSTPRANDIAL readings are variably higher after breakfast with some significant spikes at times and other days going up only modestly and peaking around 11 AM  After lunch her blood sugars do not appear to be going up much and blood sugars may be high sometimes for long period of time from breakfast or lunch  Blood sugars after dinner are not completely evaluated but does have occasional excessive increases No hypoglycemia except may have low normal readings early morning at times and rarely before dinner  CGM use % of time 63  2-week average/GV 136  Time in range       81%  % Time Above 180 14+4  % Time above 250   % Time Below 70 1     PRE-MEAL Fasting  Lunch Dinner Bedtime Overall  Glucose range:       Averages: 119    136   POST-MEAL PC Breakfast PC Lunch PC Dinner  Glucose range:     Averages: 183  148   Previous data:  CGM use % of time 91  2-week average/GV 134/35  Time in range     84   %  % Time Above 180 12+3  % Time above 250   % Time Below 70 1     PRE-MEAL Fasting Lunch Dinner Bedtime Overall  Glucose range:       Averages: 130  106     POST-MEAL PC Breakfast PC Lunch PC Dinner  Glucose range:     Averages: 194 132 153    Dietician visit, most recent: 11/14  Weight history:   Wt Readings from Last 3 Encounters:  01/19/22 127 lb 3.2 oz (57.7 kg)  12/09/21 124 lb 9.6 oz (56.5 kg)  11/13/21 125 lb (56.7 kg)    Glycemic control:   Lab Results   Component Value Date   HGBA1C 6.9 (H) 01/16/2022   HGBA1C 7.2 (H) 10/17/2021   HGBA1C 7.3 (H) 07/18/2021   Lab Results  Component Value Date   MICROALBUR 0.8 12/13/2020   LDLCALC 58 07/18/2021   CREATININE 0.83 01/16/2022   Lab Results  Component Value Date   MICRALBCREAT 1.2 12/13/2020    Lab Results  Component Value Date   FRUCTOSAMINE 262 02/14/2021   FRUCTOSAMINE 260 10/25/2020    Lab on 01/16/2022  Component Date Value Ref Range Status   Sodium 01/16/2022 140  135 - 145 mEq/L Final   Potassium 01/16/2022 3.8  3.5 - 5.1 mEq/L Final   Chloride 01/16/2022 102  96 - 112 mEq/L Final   CO2 01/16/2022 29  19 - 32 mEq/L Final   Glucose, Bld 01/16/2022 201 (H)  70 - 99 mg/dL Final   BUN 01/16/2022 14  6 - 23 mg/dL Final   Creatinine, Ser 01/16/2022 0.83  0.40 - 1.20 mg/dL Final   GFR 01/16/2022 72.83  >60.00 mL/min Final   Calculated using the CKD-EPI Creatinine Equation (2021)   Calcium 01/16/2022 9.1  8.4 - 10.5 mg/dL Final   Hgb A1c MFr Bld 01/16/2022 6.9 (H)  4.6 - 6.5 % Final   Glycemic Control Guidelines for People with Diabetes:Non Diabetic:  <6%Goal of Therapy: <7%Additional Action Suggested:  >8%     Allergies as of 01/19/2022       Reactions   Penicillins Other (See Comments)   Driving couldn't see and head felt heavy  Has patient had a PCN reaction causing immediate rash, facial/tongue/throat swelling, SOB or lightheadedness with hypotension: No Has patient had a PCN reaction causing severe rash involving mucus membranes or skin necrosis: No Has patient had a PCN reaction that required hospitalization: No Has patient had a PCN reaction occurring within the last 10 years: No If all of the above answers are "NO", then may proceed with Cephalosporin use.        Medication List        Accurate as of January 19, 2022  8:33 PM. If you have any questions, ask your nurse or doctor.          STOP taking these medications    canagliflozin 100 MG Tabs  tablet Commonly known as: Invokana Stopped by: Elayne Snare, MD       TAKE these medications    aspirin EC 81 MG tablet Take 81 mg  by mouth at bedtime.   atorvastatin 80 MG tablet Commonly known as: LIPITOR TAKE 1 TABLET(80 MG) BY MOUTH DAILY   dapagliflozin propanediol 10 MG Tabs tablet Commonly known as: Farxiga Take 1 tablet (10 mg total) by mouth daily before breakfast. Started by: Elayne Snare, MD   Dexcom G6 Receiver Devi Use to display glucose from sensor   escitalopram 10 MG tablet Commonly known as: LEXAPRO TAKE 1 TABLET(10 MG) BY MOUTH DAILY   FreeStyle Libre 2 Sensor Misc 2 Devices by Does not apply route every 14 (fourteen) days.   Dexcom G6 Sensor Misc Use to monitor blood sugar, change after 10 days   glucose blood test strip 1 each by Other route as needed for other. Use as instructed   lisinopril 40 MG tablet Commonly known as: ZESTRIL TAKE 1 TABLET EVERY DAY   metFORMIN 500 MG 24 hr tablet Commonly known as: GLUCOPHAGE-XR TAKE 2 TABLETS(1000 MG) BY MOUTH IN THE MORNING AND AT BEDTIME   multivitamin with minerals Tabs tablet Take 1 tablet by mouth daily.   repaglinide 2 MG tablet Commonly known as: PRANDIN TAKE 2 TABLETS BY MOUTH  BEFORE BREAKFAST , 1 TABLET AT LUNCH AND 1 TABLET  BEFORE DINNER   Vitamin D3 75 MCG (3000 UT) Tabs Take 3,000 Units by mouth at bedtime.        Allergies:  Allergies  Allergen Reactions   Penicillins Other (See Comments)    Driving couldn't see and head felt heavy  Has patient had a PCN reaction causing immediate rash, facial/tongue/throat swelling, SOB or lightheadedness with hypotension: No Has patient had a PCN reaction causing severe rash involving mucus membranes or skin necrosis: No Has patient had a PCN reaction that required hospitalization: No Has patient had a PCN reaction occurring within the last 10 years: No If all of the above answers are "NO", then may proceed with Cephalosporin use.      Past Medical History:  Diagnosis Date   CVA (cerebral vascular accident) ([place]) 2015   Depression    Diabetes ([place])    t2   Gallstones    Hyperlipemia    Hypertension    Migraines     Past Surgical History:  Procedure Laterality Date   BREAST BIOPSY Right 05/2019   CESAREAN SECTION     x2   CHOLECYSTECTOMY N/A 05/25/2016   Procedure: LAPAROSCOPIC CHOLECYSTECTOMY WITH INTRAOPERATIVE CHOLANGIOGRAM;  Surgeon: Johnathan Hausen, MD;  Location: [facility] ORS;  Service: General;  Laterality: N/A;   IR GENERIC HISTORICAL  03/13/2016   IR PERC CHOLECYSTOSTOMY 03/13/2016 Sandi Mariscal, MD [facility]INTER[facility] RAD   IR GENERIC HISTORICAL  04/28/2016   IR RADIOLOGIST EVAL & MGMT 04/28/2016 Jacqulynn Cadet, MD [facility] INTER[facility] RAD   LOOP RECORDER IMPLANT N/A 11/29/2013   Procedure: LOOP RECORDER IMPLANT;  Surgeon: Evans Lance, MD;  Location: [facility] CATH LAB;  Service: Cardiovascular;  Laterality: N/A;   TEE WITHOUT CARDIOVERSION N/A 08/24/2013   Procedure: TRANSESOPHAGEAL ECHOCARDIOGRAM (TEE);  Surgeon: Sanda Klein, MD;  Location: [facility] ENDOSCOPY;  Service: Cardiovascular;  Laterality: N/A;    Family History  Problem Relation Age of Onset   Stomach cancer Mother    High blood pressure Mother    Diabetes Mother    Diabetes Maternal Grandfather    High blood pressure Maternal Grandfather    Heart disease Neg Hx     Social History:  reports that she has never smoked. She has never used smokeless tobacco. She reports that she does not drink alcohol and  does not use drugs.   Review of Systems    Lipid history: LDL controlled with atorvastatin 80 mg. Followed by PCP   Lab Results  Component Value Date   CHOL 128 07/18/2021   HDL 48.30 07/18/2021   LDLCALC 58 07/18/2021   LDLDIRECT 136.0 09/24/2020   TRIG 105.0 07/18/2021   CHOLHDL 3 07/18/2021           Hypertension: Has been present and treated with lisinopril 40 mg She takes this regularly in the evenings but blood pressure is unusually high along with  her pulse  Followed by PCP  BP Readings from Last 3 Encounters:  01/19/22 (!) 166/84  12/09/21 122/70  11/13/21 122/82    Most recent eye exam was in 8/23 with Dr. Zadie Rhine.  Has done proliferative retinopathy  Most recent foot exam: 3/22  Currently known complications of diabetes: Retinopathy with decreased vision on the right, regularly followed by retinal surgery  Normal microalbumin in 9/22  History of CVA with left facial numbness and speech difficulty  Has had a flu vaccine  LABS:  Lab on 01/16/2022  Component Date Value Ref Range Status   Sodium 01/16/2022 140  135 - 145 mEq/L Final   Potassium 01/16/2022 3.8  3.5 - 5.1 mEq/L Final   Chloride 01/16/2022 102  96 - 112 mEq/L Final   CO2 01/16/2022 29  19 - 32 mEq/L Final   Glucose, Bld 01/16/2022 201 (H)  70 - 99 mg/dL Final   BUN 01/16/2022 14  6 - 23 mg/dL Final   Creatinine, Ser 01/16/2022 0.83  0.40 - 1.20 mg/dL Final   GFR 01/16/2022 72.83  >60.00 mL/min Final   Calculated using the CKD-EPI Creatinine Equation (2021)   Calcium 01/16/2022 9.1  8.4 - 10.5 mg/dL Final   Hgb A1c MFr Bld 01/16/2022 6.9 (H)  4.6 - 6.5 % Final   Glycemic Control Guidelines for People with Diabetes:Non Diabetic:  <6%Goal of Therapy: <7%Additional Action Suggested:  >8%     Physical Examination:  BP (!) 166/84   Pulse 85   Ht 5\' 2"  (1.575 m)   Wt 127 lb 3.2 oz (57.7 kg)   SpO2 98%   BMI 23.27 kg/m        ASSESSMENT:  Diabetes type 2 nonobese  See history of present illness for detailed discussion of current diabetes management, blood sugar patterns and problems identified  A1c is at 6.9  Current treatment regimen is Metformin 1000 mg, currently Invokana 100 mg and Prandin 2-4 mg, 3 times daily  Her blood glucose patterns were reviewed in detail from her freestyle [employer]  She has consistently high readings after breakfast and sometimes at dinner despite taking Prandin and reducing her carbohydrates in the  morning Premeal blood sugars are fairly good or even low normal She has fairly balanced meals but her blood sugars are likely higher because of insulin deficiency However she is very reluctant to consider insulin  Her time in target on her CGM is still over 80% but she has significant postprandial spikes at times especially after breakfast Also monitoring after dinner is inadequate  HYPERTENSION: Blood pressure is unusually high checked twice despite good compliance with her medication   PLAN:     For more complete and accurate assessment of data will try to get her the [place] 3 sensor on since she has not previously received a reader for the [place] 2 sensor Discussed bradycardia described thoroughly parachute website When finished with Invokana she can  go back to [place] which apparently was better covered and still take half of 10 mg Subsequently may try half of 300 mg Invokana in January She may be a good candidate for [place] but this is not covered No other changes needed  She will call Dr Ethelene Hal to schedule follow-up of blood pressure management In the meantime if she is able to check it at home she will do so   There are no Patient Instructions on file for this visit.  Total visit time including counseling = 30 minutes      Elayne Snare 01/19/2022, 8:33 PM   Note: This office note was prepared with Dragon voice recognition system technology. Any transcriptional errors that result from this process are unintentional.

## 2022-01-21 ENCOUNTER — Encounter: Payer: Self-pay | Admitting: Endocrinology

## 2022-01-21 ENCOUNTER — Ambulatory Visit (HOSPITAL_BASED_OUTPATIENT_CLINIC_OR_DEPARTMENT_OTHER): Payer: Medicare HMO | Admitting: Cardiology

## 2022-01-30 ENCOUNTER — Other Ambulatory Visit: Payer: Self-pay | Admitting: Obstetrics & Gynecology

## 2022-01-30 DIAGNOSIS — N6489 Other specified disorders of breast: Secondary | ICD-10-CM

## 2022-02-06 ENCOUNTER — Encounter (HOSPITAL_BASED_OUTPATIENT_CLINIC_OR_DEPARTMENT_OTHER): Payer: Self-pay | Admitting: Cardiology

## 2022-02-06 ENCOUNTER — Ambulatory Visit (HOSPITAL_BASED_OUTPATIENT_CLINIC_OR_DEPARTMENT_OTHER): Payer: Medicare HMO | Admitting: Cardiology

## 2022-02-06 VITALS — BP 128/82 | HR 88 | Ht 62.0 in | Wt 128.7 lb

## 2022-02-06 DIAGNOSIS — Z7189 Other specified counseling: Secondary | ICD-10-CM | POA: Diagnosis not present

## 2022-02-06 DIAGNOSIS — Z8673 Personal history of transient ischemic attack (TIA), and cerebral infarction without residual deficits: Secondary | ICD-10-CM | POA: Diagnosis not present

## 2022-02-06 DIAGNOSIS — E119 Type 2 diabetes mellitus without complications: Secondary | ICD-10-CM

## 2022-02-06 DIAGNOSIS — I10 Essential (primary) hypertension: Secondary | ICD-10-CM

## 2022-02-06 MED ORDER — BLOOD PRESSURE CUFF MISC: 1.0000 | Freq: Once | 0 refills | Status: AC

## 2022-02-06 NOTE — Patient Instructions (Addendum)
Medication Instructions:  None  *If you need a refill on your cardiac medications before your next appointment, please call your pharmacy*   Lab Work: None   Testing/Procedures:    Follow-Up: At [facility], you and your health needs are our priority.  As part of our continuing mission to provide you with exceptional heart care, we have created designated Provider Care Teams.  These Care Teams include your primary Cardiologist (physician) and Advanced Practice Providers (APPs -  Physician Assistants and Nurse Practitioners) who all work together to provide you with the care you need, when you need it.  We recommend signing up for the patient portal called "MyChart".  Sign up information is provided on this After Visit Summary.  MyChart is used to connect with patients for Virtual Visits (Telemedicine).  Patients are able to view lab/test results, encounter notes, upcoming appointments, etc.  Non-urgent messages can be sent to your provider as well.   To learn more about what you can do with MyChart, go to [email or web address].    Your next appointment:   6 month(s)  The format for your next appointment:   In Person  Provider:   Buford Dresser, MD   Other Instructions Check blood pressure as directed             how to check blood pressure:  -sit comfortably in a chair, feet uncrossed and flat on floor, for 5-10 minutes  -arm ideally should rest at the level of the heart. However, arm should be relaxed and not tense (for example, do not hold the arm up unsupported)  -avoid exercise, caffeine, and tobacco for at least 30 minutes prior to BP reading  -don't take BP cuff reading over clothes (always place on skin directly)  -I prefer to know how well the medication is working, so I would like you to take your readings 1-2 hours after taking your blood pressure medication if possible   I like arm cuffs better than wrist cuffs. Omron or a similar brand  is a good choice. If your blood pressure is consistently more than 140/90, let me know and we will adjust your medication. If you are in pain or uncomfortable, it is normal for your blood pressure to be higher (and it can be a lot higher, like 180-200 on the top number or more), so I would not consider those readings when you are thinking about your average blood pressure.

## 2022-02-06 NOTE — Progress Notes (Signed)
Cardiology Office Note:    Date:  02/06/2022   ID:  Marie [name], DOB Jan 03, 1956, MRN [id number]  PCP:  Marie Maw, MD  Cardiologist:  Buford Dresser, MD  Referring MD: Marie [name],*   CC: Follow up  History of Present Illness:    Marie [name] is a 66 y.o. female with a hx of CVA, hypertension, type II diabetes, dyslipidemia, carotid stenosis who is seen for follow up today. I initially met her 05/01/19 as a new patient to me/prior patient of Dr. Bettina [name] for the evaluation and management of CVA and hypertension.  She was last seen by Dr. Bettina [name] on 02/04/2018. She has a history of CVA. Workup unrevealing, loop recorder has not shown atrial fibrillation during the entire life of the device (implanted 2015). Her hypertension was well controlled on ACEi and amlodipine.  She was last seen by me on 02/12/2021 with persistent issues controlling her blood sugars. She had recently started Repaglinide. Per her log, she often had low blood sugars in the 60s or 70s. She had issues with lightheadedness with standing too quickly. She noted occasional hot flashes as well.  Today, she reports concerns regarding increased tear production over the last x1 week. She reports blurred vision with staring at a phone or computer screen. She feels that her eyes have to adjust briefly when looking at distant visual fields. She does get routine eye exams with Dr. Zadie [name]. She had right eye surgery with Dr. Katy [name] in the past but has had difficulty getting in touch with him recently.  She reports fluctuations in her blood pressures at home. Her first BP was 154/80 today and on recheck it was 128/82.  She reports that she intermittently has brief dizziness with turning her head quickly and with sudden positional changes.  She continues to be active without any symptoms during her exercises.  The patient denies chest pain, chest pressure, palpitations, dyspnea at rest or with exertion, PND,  orthopnea, or leg swelling. Denies cough, fever, chills, nausea, or vomiting. Denies syncope.   Past Medical History:  Diagnosis Date   CVA (cerebral vascular accident) ([place]) 2015   Depression    Diabetes ([place])    t2   Gallstones    Hyperlipemia    Hypertension    Migraines     Past Surgical History:  Procedure Laterality Date   BREAST BIOPSY Right 05/2019   CESAREAN SECTION     x2   CHOLECYSTECTOMY N/A 05/25/2016   Procedure: LAPAROSCOPIC CHOLECYSTECTOMY WITH INTRAOPERATIVE CHOLANGIOGRAM;  Surgeon: Marie Hausen, MD;  Location: [facility] ORS;  Service: General;  Laterality: N/A;   IR GENERIC HISTORICAL  03/13/2016   IR PERC CHOLECYSTOSTOMY 03/13/2016 Marie Mariscal, MD [facility]INTER[facility] RAD   IR GENERIC HISTORICAL  04/28/2016   IR RADIOLOGIST EVAL & MGMT 04/28/2016 Marie Cadet, MD [facility] INTER[facility]   LOOP RECORDER IMPLANT N/A 11/29/2013   Procedure: LOOP RECORDER IMPLANT;  Surgeon: Marie Lance, MD;  [facility] C[facility] LAB;  Service: Cardiovascular;  Laterality: N/A;   TEE WITHOUT CARDIOVERSION N/A 08/24/2013   Procedure: TRANSESOPHAGEAL ECHOCARDIOGRAM (TEE);  Surgeon: Marie Klein, MD;  Location: [facility] ENDOSCOPY;  Service: Cardiovascular;  Laterality: N/A;    Current Medications: Current Outpatient Medications on File Prior to Visit  Medication Sig   ascorbic acid (VITAMIN C) 500 MG tablet Take 500 mg by mouth daily.   aspirin EC 81 MG tablet Take 81 mg by mouth at bedtime.   atorvastatin (LIPITOR) 80 MG tablet TAKE 1 TABLET(80  MG) BY MOUTH DAILY   Cholecalciferol (VITAMIN D3) 3000 units TABS Take 3,000 Units by mouth at bedtime.   Continuous Blood Gluc Receiver (DEXCOM G6 RECEIVER) DEVI Use to display glucose from sensor   Continuous Blood Gluc Sensor (DEXCOM G6 SENSOR) MISC Use to monitor blood sugar, change after 10 days   Continuous Blood Gluc Sensor (FREESTYLE LIBRE 2 SENSOR) MISC 2 Devices by Does not apply route every 14 (fourteen) days.   cyanocobalamin 2000 MCG tablet Take 2,000 mcg  by mouth daily.   dapagliflozin propanediol (FARXIGA) 10 MG TABS tablet Take 5 mg by mouth daily.   escitalopram (LEXAPRO) 10 MG tablet TAKE 1 TABLET(10 MG) BY MOUTH DAILY   glucose blood test strip 1 each by Other route as needed for other. Use as instructed   lisinopril (ZESTRIL) 40 MG tablet TAKE 1 TABLET EVERY DAY   metFORMIN (GLUCOPHAGE-XR) 500 MG 24 hr tablet TAKE 2 TABLETS(1000 MG) BY MOUTH IN THE MORNING AND AT BEDTIME   Multiple Vitamin (MULTIVITAMIN WITH MINERALS) TABS tablet Take 1 tablet by mouth daily.   repaglinide (PRANDIN) 2 MG tablet TAKE 2 TABLETS BY MOUTH  BEFORE BREAKFAST , 1 TABLET AT LUNCH AND 1 TABLET  BEFORE DINNER   No current facility-administered medications on file prior to visit.     Allergies:   Penicillins   Social History   Tobacco Use   Smoking status: Never   Smokeless tobacco: Never  Vaping Use   Vaping Use: Never used  Substance Use Topics   Alcohol use: No    Alcohol/week: 1.0 standard drink of alcohol    Types: 1 Standard drinks or equivalent per week    Comment: Social   Drug use: No    Family History: family history includes Diabetes in her maternal grandfather and mother; High blood pressure in her maternal grandfather and mother; Stomach cancer in her mother. There is no history of Heart disease.  ROS:   Please see the history of present illness.   (+) Lightheadedness (+) Blurred vision, intermittent Additional pertinent ROS otherwise unremarkable.   EKGs/Labs/Other Studies Reviewed:    The following studies were reviewed today:  TEE 08/24/13 - Left ventricle: Systolic function was normal. Wall motion was    normal; there were no regional wall motion abnormalities.  - Left atrium: No evidence of thrombus in the atrial cavity or    appendage.  - Right atrium: No evidence of thrombus in the atrial cavity or    appendage.  - Atrial septum: No defect or patent foramen ovale was identified.    Echo contrast study showed no  right-to-left atrial level shunt,    at baseline or with provocation. Echo contrast study showed no    right-to-left shunt, following an increase in RA pressure induced    by provocative maneuvers.    EKG:  EKG is personally reviewed.   02/06/2022: NSR at 88 bpm 02/06/2022: EKG was not ordered. 02/12/2021: EKG was not ordered. 11/04/2020: NSR at 74 bpm. 11/20/2019: EKG was not ordered. 05/01/19: Sinus rhythm with sinus arrhythmia.  Recent Labs: 07/18/2021: ALT 33 01/16/2022: BUN 14; Creatinine, Ser 0.83; Potassium 3.8; Sodium 140   Recent Lipid Panel    Component Value Date/Time   CHOL 128 07/18/2021 0919   TRIG 105.0 07/18/2021 0919   HDL 48.30 07/18/2021 0919   CHOLHDL 3 07/18/2021 0919   VLDL 21.0 07/18/2021 0919   LDLCALC 58 07/18/2021 0919   LDLDIRECT 136.0 09/24/2020 0850    Physical Exam:  VS:  BP 128/82 (BP Location: Right Arm, Patient Position: Sitting)   Pulse 88   Ht _0  (1.575 m)   Wt 128 lb 11.2 oz (58.4 kg)   BMI 23.54 kg/m     Wt Readings from Last 3 Encounters:  02/06/22 128 lb 11.2 oz (58.4 kg)  01/19/22 127 lb 3.2 oz (57.7 kg)  12/09/21 124 lb 9.6 oz (56.5 kg)    GEN: Well nourished, well developed in no acute distress HEENT: Normal, moist mucous membranes NECK: No JVD CARDIAC: regular rhythm, normal S1 and S2, no rubs or gallops. No murmur. VASCULAR: Radial and DP pulses 2+ bilaterally. No carotid bruits RESPIRATORY:  Clear to auscultation without rales, wheezing or rhonchi  ABDOMEN: Soft, non-tender, non-distended MUSCULOSKELETAL:  Ambulates independently SKIN: Warm and dry, no edema NEUROLOGIC:  Alert and oriented x 3. No focal neuro deficits noted. PSYCHIATRIC:  Normal affect   ASSESSMENT:    1. Essential hypertension   2. History of CVA (cerebrovascular accident)   3. Type 2 diabetes mellitus without obesity ([place])   4. Cardiac risk counseling   5. Counseling on health promotion and disease prevention     PLAN:    History of CVA:   -no further strokes -no detection of afib on loop -continue aspirin, atorvastatin  Hypertension: goal <130/80, near goal today -tolerating lisinopril well, continue -we will arrange for home BP monitor  Type II diabetes, without obesity, with history of ASCVD: -she is already on SGLT2i (dapagliflozin) -with history of stroke, would recommend GLP1RA, but this was stopped in the past. Defer to her diabetes team on this. Also on metformin and repaglinide currently, followed closely  Cardiac risk counseling and secondary prevention recommendations: -recommend heart healthy/Mediterranean diet, with whole grains, fruits, vegetable, fish, lean meats, nuts, and olive oil. Limit salt. -recommend moderate walking, 3-5 times/week for 30-50 minutes each session. Aim for at least 150 minutes.week. Goal should be pace of 3 miles/hours, or walking 1.5 miles in 30 minutes -recommend avoidance of tobacco products. Avoid excess alcohol.  Plan for follow up: 6 months or sooner if needed  Buford Dresser, MD, PhD [place]  [facility]    Medication Adjustments/Labs and Tests Ordered: Current medicines are reviewed at length with the patient today.  Concerns regarding medicines are outlined above.   Orders Placed This Encounter  Procedures   EKG 12-Lead    Meds ordered this encounter  Medications   Blood Pressure Monitoring (BLOOD PRESSURE CUFF) MISC    Sig: 1 Device by Does not apply route once for 1 dose.    Dispense:  1 each    Refill:  0    Patient Instructions  Medication Instructions:  None  *If you need a refill on your cardiac medications before your next appointment, please call your pharmacy*   Lab Work: None   Testing/Procedures:    Follow-Up: At [facility], you and your health needs are our priority.  As part of our continuing mission to provide you with exceptional heart care, we have created designated Provider Care Teams.  These Care Teams  include your primary Cardiologist (physician) and Advanced Practice Providers (APPs -  Physician Assistants and Nurse Practitioners) who all work together to provide you with the care you need, when you need it.  We recommend signing up for the patient portal called "MyChart".  Sign up information is provided on this After Visit Summary.  MyChart is used to connect with patients for Virtual Visits (Telemedicine).  Patients  are able to view lab/test results, encounter notes, upcoming appointments, etc.  Non-urgent messages can be sent to your provider as well.   To learn more about what you can do with MyChart, go to [email or web address].    Your next appointment:   6 month(s)  The format for your next appointment:   In Person  Provider:   Buford Dresser, MD   Other Instructions Check blood pressure as directed             how to check blood pressure:  -sit comfortably in a chair, feet uncrossed and flat on floor, for 5-10 minutes  -arm ideally should rest at the level of the heart. However, arm should be relaxed and not tense (for example, do not hold the arm up unsupported)  -avoid exercise, caffeine, and tobacco for at least 30 minutes prior to BP reading  -don't take BP cuff reading over clothes (always place on skin directly)  -I prefer to know how well the medication is working, so I would like you to take your readings 1-2 hours after taking your blood pressure medication if possible   I like arm cuffs better than wrist cuffs. Omron or a similar brand is a good choice. If your blood pressure is consistently more than 140/90, let me know and we will adjust your medication. If you are in pain or uncomfortable, it is normal for your blood pressure to be higher (and it can be a lot higher, like 180-200 on the top number or more), so I would not consider those readings when you are thinking about your average blood pressure.     I,Alexis Herring,acting as a Education administrator for  [employer], MD.,have documented all relevant documentation on the behalf of Buford Dresser, MD,as directed by  Buford Dresser, MD while in the presence of Buford Dresser, MD.  I, Buford Dresser, MD, have reviewed all documentation for this visit. The documentation on 03/09/22 for the exam, diagnosis, procedures, and orders are all accurate and complete.   Signed, Buford Dresser, MD PhD 02/06/2022  [place]

## 2022-02-09 ENCOUNTER — Telehealth: Payer: Self-pay | Admitting: Family Medicine

## 2022-02-09 NOTE — Telephone Encounter (Signed)
Pt is wanting Dr Ethelene Hal to look at her last OV with Dr. Buford Dresser from 02/06/22. She is wanting to know if she should schedule an OV with Dr Ethelene Hal or is there another recommendation from these notes. Please advise pt at [phone]

## 2022-02-11 NOTE — Telephone Encounter (Signed)
Unable to reach pt , unable to LVM, first attempt.

## 2022-02-11 NOTE — Telephone Encounter (Signed)
Pt would like to get a cb letting her know if Dr. Doreene Burke is able to see her for this concern. She says she feels more comfortable seeing him. [name]"s #   [phone]

## 2022-02-12 ENCOUNTER — Telehealth: Payer: Self-pay | Admitting: Family Medicine

## 2022-02-12 NOTE — Telephone Encounter (Signed)
Pt want you to give a call back please a.s.a.p. do want the to come in for appointment. Pt  stated to let her know what should she do next

## 2022-02-13 NOTE — Telephone Encounter (Signed)
Pt has been scheduled on 02/17/2022 at 1:20 pm,for concern of HTN

## 2022-02-17 ENCOUNTER — Ambulatory Visit (INDEPENDENT_AMBULATORY_CARE_PROVIDER_SITE_OTHER): Payer: Medicare HMO | Admitting: Family Medicine

## 2022-02-17 ENCOUNTER — Encounter: Payer: Self-pay | Admitting: Family Medicine

## 2022-02-17 ENCOUNTER — Other Ambulatory Visit: Payer: Medicare HMO

## 2022-02-17 VITALS — BP 146/78 | HR 74 | Temp 97.7°F | Ht 62.0 in | Wt 129.8 lb

## 2022-02-17 DIAGNOSIS — F341 Dysthymic disorder: Secondary | ICD-10-CM | POA: Diagnosis not present

## 2022-02-17 DIAGNOSIS — H8309 Labyrinthitis, unspecified ear: Secondary | ICD-10-CM | POA: Diagnosis not present

## 2022-02-17 NOTE — Progress Notes (Signed)
Established Patient Office Visit  Subjective   Patient ID: Marie [name], female    DOB: May 12, 1955  Age: 66 y.o. MRN: [id number]  Chief Complaint  Patient presents with   Ear Problem    Patient was recommended to come in to have ears for possible crystals on ear drum.     HPI patient describes a loss of position sense sometimes when she turns her head suddenly or is walking downstairs.  She sometimes staggers.  There is no obvious spinning.  Blood pressure meds have been adjusted to avoid hypotension.  She is doing well with the Lexapro.  Mood is definitely responded to the treatment.    Review of Systems  Constitutional: Negative.   HENT: Negative.    Eyes:  Negative for blurred vision, discharge and redness.  Respiratory: Negative.    Cardiovascular: Negative.   Gastrointestinal:  Negative for abdominal pain.  Genitourinary: Negative.   Musculoskeletal: Negative.  Negative for myalgias.  Skin:  Negative for rash.  Neurological:  Negative for tingling, loss of consciousness and weakness.  Endo/Heme/Allergies:  Negative for polydipsia.      Objective:     BP (!) 146/78 (BP Location: Left Arm, Patient Position: Sitting, Cuff Size: Normal)   Pulse 74   Temp 97.7 F (36.5 C) (Temporal)   Ht 5\' 2"  (1.575 m)   Wt 129 lb 12.8 oz (58.9 kg)   SpO2 97%   BMI 23.74 kg/m    Physical Exam Constitutional:      General: She is not in acute distress.    Appearance: Normal appearance. She is not ill-appearing, toxic-appearing or diaphoretic.  HENT:     Head: Normocephalic and atraumatic.     Right Ear: Tympanic membrane, ear canal and external ear normal.     Left Ear: Tympanic membrane, ear canal and external ear normal.     Mouth/Throat:     Mouth: Mucous membranes are moist.     Pharynx: Oropharynx is clear. No oropharyngeal exudate or posterior oropharyngeal erythema.  Eyes:     General: No scleral icterus.       Right eye: No discharge.        Left eye: No discharge.      Extraocular Movements: Extraocular movements intact.     Conjunctiva/sclera: Conjunctivae normal.     Pupils: Pupils are equal, round, and reactive to light.  Cardiovascular:     Rate and Rhythm: Normal rate and regular rhythm.  Pulmonary:     Effort: Pulmonary effort is normal. No respiratory distress.     Breath sounds: Normal breath sounds.  Musculoskeletal:     Cervical back: No rigidity or tenderness.  Skin:    General: Skin is warm and dry.  Neurological:     Mental Status: She is alert and oriented to person, place, and time.  Psychiatric:        Mood and Affect: Mood normal.        Behavior: Behavior normal.      No results found for any visits on 02/17/22.    The ASCVD Risk score (Arnett DK, et al., 2019) failed to calculate for the following reasons:   The valid total cholesterol range is 130 to 320 mg/dL    Assessment & Plan:   Problem List Items Addressed This Visit       Nervous and Auditory   Labyrinthitis   Relevant Orders   Ambulatory referral to ENT     Other   Dysthymia - Primary  Return in about 6 months (around 08/18/2022), or if symptoms worsen or fail to improve.  Information was given on labyrinthitis.  Believe that is what she is experiencing.  She may be a candidate for the Epley maneuvers.  Recommended RSV vaccine continue escitalopram for hernia.  Mliss Sax, MD

## 2022-03-09 ENCOUNTER — Other Ambulatory Visit: Payer: Self-pay | Admitting: Endocrinology

## 2022-03-09 ENCOUNTER — Encounter (HOSPITAL_BASED_OUTPATIENT_CLINIC_OR_DEPARTMENT_OTHER): Payer: Self-pay | Admitting: Cardiology

## 2022-03-09 DIAGNOSIS — E1165 Type 2 diabetes mellitus with hyperglycemia: Secondary | ICD-10-CM

## 2022-03-11 ENCOUNTER — Ambulatory Visit: Payer: Self-pay

## 2022-03-11 NOTE — Patient Instructions (Signed)
Visit Information  Thank you for taking time to visit with me today. Please don't hesitate to contact me if I can be of assistance to you.   Following are the goals we discussed today:   Goals Addressed             This Visit's Progress    Diabetes Management       Care Coordination Interventions: Provided education to patient about basic DM disease process Discussed plans with patient for ongoing care management follow up and provided patient with direct contact information for care management team Discussed importance of diet control of diabetes such as limiting sweets and carbohydrates.  Last sugar check 142.          Our next appointment is by telephone on 05/13/22 at 1000  Please call the care guide team at [phone] if you need to cancel or reschedule your appointment.   If you are experiencing a Mental Health or Behavioral Health Crisis or need someone to talk to, please call the Suicide and Crisis Lifeline: 988   Patient verbalizes understanding of instructions and care plan provided today and agrees to view in MyChart. Active MyChart status and patient understanding of how to access instructions and care plan via MyChart confirmed with patient.     Telephone follow up appointment with care management team member scheduled for: february  Luismario Coston J Kelcee Bjorn, RN, MSN [facility] Care Management Care Management Coordinator Direct Line [phone]

## 2022-03-11 NOTE — Patient Outreach (Signed)
  Care Coordination   Follow Up Visit Note   03/11/2022 Name: LAYLAA GUEVARRA MRN: [id number] DOB: November 21, 1955  SHARY LAMOS is a 66 y.o. year old female who sees Mliss Sax, MD for primary care. I spoke with  [name] by phone today.  What matters to the patients health and wellness today?  none    Goals Addressed             This Visit's Progress    Diabetes Management       Care Coordination Interventions: Provided education to patient about basic DM disease process Discussed plans with patient for ongoing care management follow up and provided patient with direct contact information for care management team Discussed importance of diet control of diabetes such as limiting sweets and carbohydrates.  Last sugar check 142.          SDOH assessments and interventions completed:  Yes     Care Coordination Interventions:  Yes, provided   Follow up plan: Follow up call scheduled for February    Encounter Outcome:  Pt. Visit Completed   Bary Leriche, RN, MSN [facility] Care Management Care Management Coordinator Direct Line [phone]

## 2022-03-16 ENCOUNTER — Telehealth: Payer: Self-pay | Admitting: Cardiology

## 2022-03-16 NOTE — Telephone Encounter (Signed)
Returned call to patient and provided recommendations from Dr. Cristal Deer. Patient verbalizes that she will follow up with her primary care provider as recommended.

## 2022-03-16 NOTE — Telephone Encounter (Signed)
Returned call to patient,   Patient states that her blood pressure has been elevated 141/86 and 152/93.These elevated blood pressures seem to be unrelated to why the patient called in today. She was asked by her PCP to keep a check on her blood pressure.   Patient states that she notes her ears ringing, and she feels week and feels like she will fall out. She notes that she is unable to see anything when this is happening. She notes she has stopped driving due to to this. Patient states she was told to tell all this to her PCP. She states that she did tell one of her doctors. Upon further questioning it seems that patient is unaware of which of her doctors treat what. She mentions a doctor Haraway-smith, who upon looking through the chart is an Web designer. Patient calling to ask who needs to see her for this problem.   Patient was scheduled for an appointment with Gillian Shields, NP by phone operators. Will route to Gillian Shields, NP and Dr. Cristal Deer to see if patient needs to be seen by cardiology or another speciality.

## 2022-03-16 NOTE — Telephone Encounter (Signed)
Pt c/o BP issue: STAT if pt c/o blurred vision, one-sided weakness or slurred speech  1. What are your last 5 BP readings? 141/86 152/93  2. Are you having any other symptoms (ex. Dizziness, headache, blurred vision, passed out)? Ringing in ear and weak   3. What is your BP issue? Pt states that she feels extremely weak and would like to speak to someone in regards to this.

## 2022-03-16 NOTE — Telephone Encounter (Signed)
The blood pressures are not so elevated that I would be expecting this to cause her symptoms, and if she is feeling poorly this may be raising her blood pressure. Her PCP is Dr. Doreene Burke, and I'd recommend discussing with him whether she needs neurology evaluation or even if these need urgent evaluation in the ER.

## 2022-03-17 ENCOUNTER — Telehealth: Payer: Self-pay | Admitting: Family Medicine

## 2022-03-17 NOTE — Telephone Encounter (Signed)
Spoke with patient who states that she have still been having the same symptoms seems to be worst also have started with dizzy spells and numbness in feet patient called neurologist who she had seen in the past would like a new referral to see neurologist. Please advise.

## 2022-03-17 NOTE — Telephone Encounter (Signed)
Pt checking on status of this message. She is really wanting a cb.

## 2022-03-17 NOTE — Telephone Encounter (Signed)
Pt is wanting a referral to Levert Feinstein, MD, PhD Address: [address], [place], [place] [address] Phone: [phone] for when she turns fast she has to hold on to something because her vision goes black.  Please advise pt at [phone] [facility])

## 2022-03-18 NOTE — Telephone Encounter (Signed)
Patient scheduled for evaluation

## 2022-03-19 ENCOUNTER — Ambulatory Visit (INDEPENDENT_AMBULATORY_CARE_PROVIDER_SITE_OTHER): Payer: Medicare HMO | Admitting: Family Medicine

## 2022-03-19 ENCOUNTER — Encounter: Payer: Self-pay | Admitting: Family Medicine

## 2022-03-19 VITALS — BP 138/72 | HR 89 | Temp 97.7°F | Ht 62.0 in | Wt 131.6 lb

## 2022-03-19 DIAGNOSIS — H8309 Labyrinthitis, unspecified ear: Secondary | ICD-10-CM

## 2022-03-19 DIAGNOSIS — G901 Familial dysautonomia [Riley-Day]: Secondary | ICD-10-CM | POA: Diagnosis not present

## 2022-03-19 DIAGNOSIS — R42 Dizziness and giddiness: Secondary | ICD-10-CM

## 2022-03-19 DIAGNOSIS — E1142 Type 2 diabetes mellitus with diabetic polyneuropathy: Secondary | ICD-10-CM | POA: Diagnosis not present

## 2022-03-19 NOTE — Progress Notes (Signed)
Established Patient Office Visit   Subjective:  Patient ID: Marie [name], female    DOB: 1956/01/16  Age: 66 y.o. MRN: [id number]  Chief Complaint  Patient presents with   Referral    Patient would like referral to neurologist for blurred vision, dizzy spells when head turning to fast, numbness in both feet. Patient not fasting.     HPI Encounter Diagnoses  Name Primary?   Labyrinthitis, unspecified laterality Yes   Diabetic peripheral neuropathy (HCC)    Orthostatic lightheadedness    Dysautonomia ([place])    Ongoing issues with dizziness and lightheadedness.  Symptoms are worse when she turns her head suddenly.  She may feel a sinking sensation.  She denies any type of spinning sensation.  Sometimes when she stands she will feel a sinking sensation and feels also unsteady on her feet.  This is led to a fall down the stairs at her home.  History of severe diabetic retinopathy.  She denies palpitations.  Assures me that she hydrates well.  Blood pressure at home runs in the 130s over 80s.  She has never met measured pressures under 100.  Ongoing history of tingling in her feet and shins.  There is no back pain.   Review of Systems  Constitutional: Negative.   HENT: Negative.    Eyes:  Negative for blurred vision, discharge and redness.  Respiratory: Negative.    Cardiovascular: Negative.   Gastrointestinal:  Negative for abdominal pain.  Genitourinary: Negative.   Musculoskeletal: Negative.  Negative for myalgias.  Skin:  Negative for rash.  Neurological:  Positive for dizziness and tingling. Negative for loss of consciousness, weakness and headaches.  Endo/Heme/Allergies:  Negative for polydipsia.     Current Outpatient Medications:    ascorbic acid (VITAMIN C) 500 MG tablet, Take 500 mg by mouth daily., Disp: , Rfl:    atorvastatin (LIPITOR) 80 MG tablet, TAKE 1 TABLET(80 MG) BY MOUTH DAILY, Disp: 90 tablet, Rfl: 3   Cholecalciferol (VITAMIN D3) 3000 units TABS, Take 3,000  Units by mouth at bedtime., Disp: , Rfl:    Continuous Blood Gluc Receiver ([place]) DEVI, Use to display glucose from sensor, Disp: 1 each, Rfl: 0   Continuous Blood Gluc Sensor (DEXCOM G6 SENSOR) MISC, Use to monitor blood sugar, change after 10 days, Disp: 3 each, Rfl: 3   Continuous Blood Gluc Sensor (FREESTYLE LIBRE 2 SENSOR) MISC, 2 Devices by Does not apply route every 14 (fourteen) days., Disp: 6 each, Rfl: 3   cyanocobalamin 2000 MCG tablet, Take 2,000 mcg by mouth daily., Disp: , Rfl:    escitalopram (LEXAPRO) 10 MG tablet, TAKE 1 TABLET(10 MG) BY MOUTH DAILY, Disp: 90 tablet, Rfl: 1   glucose blood test strip, 1 each by Other route as needed for other. Use as instructed, Disp: , Rfl:    lisinopril (ZESTRIL) 40 MG tablet, TAKE 1 TABLET EVERY DAY, Disp: 90 tablet, Rfl: 0   metFORMIN (GLUCOPHAGE-XR) 500 MG 24 hr tablet, TAKE 2 TABLETS(1000 MG) BY MOUTH IN THE MORNING AND AT BEDTIME, Disp: 360 tablet, Rfl: 3   Multiple Vitamin (MULTIVITAMIN WITH MINERALS) TABS tablet, Take 1 tablet by mouth daily., Disp: , Rfl:    repaglinide (PRANDIN) 2 MG tablet, TAKE 2 TABLETS BEFORE BREAKFAST, TAKE 1 TABLET AT LUNCH, AND TAKE 1 TABLET BEFORE DINNER, Disp: 360 tablet, Rfl: 3   aspirin EC 81 MG tablet, Take 81 mg by mouth at bedtime. (Patient not taking: Reported on 03/19/2022), Disp: , Rfl:  dapagliflozin propanediol (FARXIGA) 10 MG TABS tablet, Take 5 mg by mouth daily. (Patient not taking: Reported on 03/19/2022), Disp: , Rfl:    INVOKANA 100 MG TABS tablet, Take 100 mg by mouth daily. (Patient not taking: Reported on 02/17/2022), Disp: , Rfl:    Objective:     BP 138/72 (BP Location: Left Arm, Patient Position: Sitting, Cuff Size: Normal)   Pulse 89   Temp 97.7 F (36.5 C) (Temporal)   Ht _0  (1.575 m)   Wt 131 lb 9.6 oz (59.7 kg)   SpO2 96%   BMI 24.07 kg/m    Physical Exam Constitutional:      General: She is not in acute distress.    Appearance: Normal appearance. She is  not ill-appearing, toxic-appearing or diaphoretic.  HENT:     Head: Normocephalic and atraumatic.     Right Ear: External ear normal.     Left Ear: External ear normal.     Mouth/Throat:     Mouth: Mucous membranes are moist.     Pharynx: Oropharynx is clear. No oropharyngeal exudate or posterior oropharyngeal erythema.  Eyes:     General: No scleral icterus.       Right eye: No discharge.        Left eye: No discharge.     Extraocular Movements: Extraocular movements intact.     Conjunctiva/sclera: Conjunctivae normal.  Cardiovascular:     Rate and Rhythm: Normal rate and regular rhythm.  Pulmonary:     Effort: Pulmonary effort is normal. No respiratory distress.     Breath sounds: Normal breath sounds.  Abdominal:     General: Bowel sounds are normal.  Musculoskeletal:     Cervical back: No rigidity or tenderness.  Skin:    General: Skin is warm and dry.  Neurological:     Mental Status: She is alert and oriented to person, place, and time.  Psychiatric:        Mood and Affect: Mood normal.        Behavior: Behavior normal.      No results found for any visits on 03/19/22.    The ASCVD Risk score (Arnett DK, et al., 2019) failed to calculate for the following reasons:   The valid total cholesterol range is 130 to 320 mg/dL    Assessment & Plan:   Labyrinthitis, unspecified laterality -     Ambulatory referral to ENT  Diabetic peripheral neuropathy (HCC) -     TSH -     Vitamin B12 -     CBC  Orthostatic lightheadedness -     Urinalysis, Routine w reflex microscopic  Dysautonomia ([place])    Return Return for scheduled follow-up..  Believe that her symptoms are most likely multifactorial.  Orthostatic lightheadedness could be due to to dysautonomia/dehydration.  ENT referral to rule out labyrinthitis.  Libby Maw, MD

## 2022-03-20 ENCOUNTER — Ambulatory Visit (HOSPITAL_BASED_OUTPATIENT_CLINIC_OR_DEPARTMENT_OTHER): Payer: Medicare HMO | Admitting: Family

## 2022-03-20 LAB — CBC
HCT: 40.2 % (ref 36.0–46.0)
Hemoglobin: 13.2 g/dL (ref 12.0–15.0)
MCHC: 32.7 g/dL (ref 30.0–36.0)
MCV: 86.5 fl (ref 78.0–100.0)
Platelets: 200 10*3/uL (ref 150.0–400.0)
RBC: 4.65 Mil/uL (ref 3.87–5.11)
RDW: 14.1 % (ref 11.5–15.5)
WBC: 6.6 10*3/uL (ref 4.0–10.5)

## 2022-03-20 LAB — URINALYSIS, ROUTINE W REFLEX MICROSCOPIC
Bilirubin Urine: NEGATIVE
Hgb urine dipstick: NEGATIVE
Nitrite: POSITIVE — AB
Specific Gravity, Urine: 1.025 (ref 1.000–1.030)
Total Protein, Urine: NEGATIVE
Urine Glucose: 500 — AB
Urobilinogen, UA: 0.2 (ref 0.0–1.0)
pH: 6 (ref 5.0–8.0)

## 2022-03-20 LAB — VITAMIN B12: Vitamin B-12: 978 pg/mL — ABNORMAL HIGH (ref 211–911)

## 2022-03-20 LAB — TSH: TSH: 5.78 u[IU]/mL — ABNORMAL HIGH (ref 0.35–5.50)

## 2022-03-24 ENCOUNTER — Ambulatory Visit
Admission: RE | Admit: 2022-03-24 | Discharge: 2022-03-24 | Disposition: A | Discharge status: Home / Self Care | Payer: Medicare HMO | Source: Ambulatory Visit | Attending: Obstetrics & Gynecology | Admitting: Obstetrics & Gynecology

## 2022-03-24 ENCOUNTER — Telehealth: Payer: Self-pay | Admitting: Family Medicine

## 2022-03-24 DIAGNOSIS — N6489 Other specified disorders of breast: Secondary | ICD-10-CM | POA: Diagnosis not present

## 2022-03-24 NOTE — Telephone Encounter (Signed)
Requested summary printed off ready for pick up.

## 2022-03-24 NOTE — Telephone Encounter (Signed)
Pt called and stated she would like a copy of last after visit summary . Pt stated she will pick it up on 12.20.23

## 2022-03-31 DIAGNOSIS — E119 Type 2 diabetes mellitus without complications: Secondary | ICD-10-CM | POA: Diagnosis not present

## 2022-04-09 ENCOUNTER — Telehealth (HOSPITAL_BASED_OUTPATIENT_CLINIC_OR_DEPARTMENT_OTHER): Payer: Self-pay | Admitting: Cardiology

## 2022-04-09 NOTE — Telephone Encounter (Signed)
Ok to refer to ENT, I don't see anything in her recent note

## 2022-04-09 NOTE — Telephone Encounter (Signed)
Good morning,   Tried to call you back and you have no voicemail set up. I reviewed your chart and did not see where Dr Harrell Gave  mentioned referral to ENT. It looks as though your primary care put referral in on 12/14. If you have not heard from ENT I would follow up with primary care   If you have any questions feel free to call the office at [place]   Above message sent to patient via mychart

## 2022-04-09 NOTE — Telephone Encounter (Signed)
Patient stated Dr. Harrell Gave recommended she see an Ear, Nose and Throat doctor.  Patient stated she did not get see the referral in her after visit summary.  Patient is following-up on getting the referral.

## 2022-04-10 NOTE — Telephone Encounter (Signed)
Agree, thank you

## 2022-04-13 ENCOUNTER — Ambulatory Visit: Payer: Medicare PPO | Admitting: Family Medicine

## 2022-04-13 ENCOUNTER — Encounter: Payer: Self-pay | Admitting: Family Medicine

## 2022-04-13 VITALS — BP 142/78 | HR 79 | Temp 97.8°F | Ht 62.0 in | Wt 133.4 lb

## 2022-04-13 DIAGNOSIS — I10 Essential (primary) hypertension: Secondary | ICD-10-CM

## 2022-04-13 DIAGNOSIS — E1142 Type 2 diabetes mellitus with diabetic polyneuropathy: Secondary | ICD-10-CM | POA: Diagnosis not present

## 2022-04-13 DIAGNOSIS — R7989 Other specified abnormal findings of blood chemistry: Secondary | ICD-10-CM

## 2022-04-13 LAB — TSH: TSH: 2.87 u[IU]/mL (ref 0.35–5.50)

## 2022-04-13 NOTE — Progress Notes (Signed)
Established Patient Office Visit   Subjective:  Patient ID: Marie [name], female    DOB: 1955/11/01  Age: 67 y.o. MRN: [id number]  Chief Complaint  Patient presents with   Follow-up    Follow  up on Thyroid and treatment     HPI Encounter Diagnoses  Name Primary?   Diabetic peripheral neuropathy (HCC) Yes   Abnormal TSH    Essential hypertension    For follow-up of elevated TSH, hypertension and diabetic peripheral neuropathy.  Patient experiences burning and stinging pains in her hands and feet that may suddenly stop, only to return at a later date.  TSH was found to be elevated with recent blood work.  Patient denies cold intolerance, constipation, difficulty losing weight.  However she has experienced hair loss.  There has been no change in the texture of her hair.  She brings in her home cough that is reading 20 points higher than her blood pressure measured with a manual cuff.   Review of Systems  Constitutional: Negative.   HENT: Negative.    Eyes:  Negative for blurred vision, discharge and redness.  Respiratory: Negative.    Cardiovascular: Negative.   Gastrointestinal:  Negative for abdominal pain.  Genitourinary: Negative.   Musculoskeletal: Negative.  Negative for myalgias.  Skin:  Negative for rash.  Neurological:  Negative for tingling, loss of consciousness and weakness.  Endo/Heme/Allergies:  Negative for polydipsia.      04/13/2022    1:27 PM 03/19/2022    3:36 PM 01/14/2022   11:45 AM  Depression screen PHQ 2/9  Decreased Interest 0 0 0  Down, Depressed, Hopeless 0 0 1  PHQ - 2 Score 0 0 1       Current Outpatient Medications:    ascorbic acid (VITAMIN C) 500 MG tablet, Take 500 mg by mouth daily., Disp: , Rfl:    atorvastatin (LIPITOR) 80 MG tablet, TAKE 1 TABLET(80 MG) BY MOUTH DAILY, Disp: 90 tablet, Rfl: 3   Cholecalciferol (VITAMIN D3) 3000 units TABS, Take 3,000 Units by mouth at bedtime., Disp: , Rfl:    Continuous Blood Gluc Sensor (DEXCOM  G6 SENSOR) MISC, Use to monitor blood sugar, change after 10 days, Disp: 3 each, Rfl: 3   cyanocobalamin 2000 MCG tablet, Take 2,000 mcg by mouth daily., Disp: , Rfl:    escitalopram (LEXAPRO) 10 MG tablet, TAKE 1 TABLET(10 MG) BY MOUTH DAILY, Disp: 90 tablet, Rfl: 1   glucose blood test strip, 1 each by Other route as needed for other. Use as instructed, Disp: , Rfl:    lisinopril (ZESTRIL) 40 MG tablet, TAKE 1 TABLET EVERY DAY, Disp: 90 tablet, Rfl: 0   metFORMIN (GLUCOPHAGE-XR) 500 MG 24 hr tablet, TAKE 2 TABLETS(1000 MG) BY MOUTH IN THE MORNING AND AT BEDTIME, Disp: 360 tablet, Rfl: 3   Multiple Vitamin (MULTIVITAMIN WITH MINERALS) TABS tablet, Take 1 tablet by mouth daily., Disp: , Rfl:    repaglinide (PRANDIN) 2 MG tablet, TAKE 2 TABLETS BEFORE BREAKFAST, TAKE 1 TABLET AT LUNCH, AND TAKE 1 TABLET BEFORE DINNER, Disp: 360 tablet, Rfl: 3   aspirin EC 81 MG tablet, Take 81 mg by mouth at bedtime. (Patient not taking: Reported on 03/19/2022), Disp: , Rfl:    dapagliflozin propanediol (FARXIGA) 10 MG TABS tablet, Take 5 mg by mouth daily. (Patient not taking: Reported on 03/19/2022), Disp: , Rfl:    INVOKANA 100 MG TABS tablet, Take 100 mg by mouth daily. (Patient not taking: Reported on 02/17/2022), Disp: ,  Rfl:    Objective:     BP (!) 142/78 (BP Location: Left Arm, Patient Position: Sitting, Cuff Size: Normal)   Pulse 79   Temp 97.8 F (36.6 C) (Temporal)   Ht 5\' 2"  (1.575 m)   Wt 133 lb 6.4 oz (60.5 kg)   SpO2 98%   BMI 24.40 kg/m    Physical Exam Constitutional:      General: She is not in acute distress.    Appearance: Normal appearance. She is not ill-appearing, toxic-appearing or diaphoretic.  HENT:     Head: Normocephalic and atraumatic.     Right Ear: External ear normal.     Left Ear: External ear normal.     Mouth/Throat:     Mouth: Mucous membranes are moist.     Pharynx: Oropharynx is clear. No oropharyngeal exudate or posterior oropharyngeal erythema.  Eyes:      General: No scleral icterus.       Right eye: No discharge.        Left eye: No discharge.     Extraocular Movements: Extraocular movements intact.     Conjunctiva/sclera: Conjunctivae normal.     Pupils: Pupils are equal, round, and reactive to light.  Cardiovascular:     Rate and Rhythm: Normal rate and regular rhythm.  Pulmonary:     Effort: Pulmonary effort is normal. No respiratory distress.     Breath sounds: Normal breath sounds.  Musculoskeletal:     Cervical back: No rigidity or tenderness.  Skin:    General: Skin is warm and dry.  Neurological:     Mental Status: She is alert and oriented to person, place, and time.  Psychiatric:        Mood and Affect: Mood normal.        Behavior: Behavior normal.      No results found for any visits on 04/13/22.    The ASCVD Risk score (Arnett DK, et al., 2019) failed to calculate for the following reasons:   The valid total cholesterol range is 130 to 320 mg/dL    Assessment & Plan:   Diabetic peripheral neuropathy (HCC)  Abnormal TSH -     TSH  Essential hypertension    Return in about 3 months (around 07/13/2022).  If TSH remains elevated, will consider treatment.  We discussed treating her neuropathy with ongoing or increased symptoms.  She will let me know.  Her blood pressure cuff is reading 20 points higher than our manual cuff.  Information given on diabetic neuropathy.  We discussed starting thyroid hormone replacement if her TSH remains elevated.    09/12/2022, MD

## 2022-04-15 ENCOUNTER — Telehealth: Payer: Self-pay | Admitting: Family Medicine

## 2022-04-15 NOTE — Telephone Encounter (Signed)
Returned  patients call no answer unable to LM will call back  

## 2022-04-15 NOTE — Telephone Encounter (Signed)
Pt called and stated that can you order her a blood pressure measure machine to and charge it to her insurance because her las measurement was off form your last measurement. Pt also asked can you give her a call

## 2022-04-21 NOTE — Telephone Encounter (Signed)
Spoke with patient who's wondering if Rx could be sent in for BP machine and cuff? Please advise

## 2022-04-22 ENCOUNTER — Other Ambulatory Visit: Payer: Self-pay

## 2022-04-22 NOTE — Telephone Encounter (Signed)
Spoke with patient after trying to find where to send Rx for BP cuff no pharmacies in the area would except Rx for BP cuff. Patient states that she will purchase another cuff and  bring it to the office to compare manual BP to new machine.

## 2022-04-22 NOTE — Telephone Encounter (Signed)
No pharmacies in area will take Rx for BP cuff patient states that she will purchase new cuff and stop by the office to compare to manual BP reading.

## 2022-04-29 ENCOUNTER — Telehealth: Payer: Self-pay | Admitting: Family Medicine

## 2022-04-29 DIAGNOSIS — R262 Difficulty in walking, not elsewhere classified: Secondary | ICD-10-CM

## 2022-04-29 DIAGNOSIS — E1142 Type 2 diabetes mellitus with diabetic polyneuropathy: Secondary | ICD-10-CM

## 2022-04-29 NOTE — Telephone Encounter (Signed)
Pt is needing a referral to a nuerologist for her legs and arms, "they are not working right" She would like to see Dr. Marcial Pacas, she has seen her in the past and feels comfortable with her.  Address: [address], [place], [place] [address] Phone: [phone]  She can not walk a straight line, she can not open a bottle anymore and she's having a lot of trouble going to the bathroom at night. This has become a serious problem in her life. Please advise pt at [phone]

## 2022-04-29 NOTE — Telephone Encounter (Signed)
Please advise message below okay to send in referral or will patient need to come in for evaluation?

## 2022-04-30 NOTE — Addendum Note (Signed)
Addended by: Jon Billings on: 04/30/2022 08:08 AM   Modules accepted: Orders

## 2022-05-01 DIAGNOSIS — H9190 Unspecified hearing loss, unspecified ear: Secondary | ICD-10-CM | POA: Diagnosis not present

## 2022-05-01 DIAGNOSIS — R42 Dizziness and giddiness: Secondary | ICD-10-CM | POA: Diagnosis not present

## 2022-05-01 DIAGNOSIS — Z8673 Personal history of transient ischemic attack (TIA), and cerebral infarction without residual deficits: Secondary | ICD-10-CM | POA: Diagnosis not present

## 2022-05-01 NOTE — Telephone Encounter (Signed)
Patient aware referral placed.

## 2022-05-02 DIAGNOSIS — R42 Dizziness and giddiness: Secondary | ICD-10-CM | POA: Insufficient documentation

## 2022-05-08 ENCOUNTER — Telehealth: Payer: Self-pay | Admitting: Family Medicine

## 2022-05-08 NOTE — Telephone Encounter (Signed)
Freestyle libre 3 was ordered by her Endo provider, she said she was trying to get it installed on her phone but it says it is requiring a practice ID. She said she is not able to get in touch with Dr Jodelle Green office and she has been trying to get in touch with them for 3 days and keeps getting transferred to a different office. Please call back as soon as possible [phone]. She said she needs help so she can use so she can results for Dr Dwyane Dee. Patient called her PCP office and asked if we could get in touch with Dr Jodelle Green office. Please advise patient

## 2022-05-13 ENCOUNTER — Ambulatory Visit: Payer: Self-pay

## 2022-05-13 NOTE — Patient Instructions (Signed)
Visit Information  Thank you for taking time to visit with me today. Please don't hesitate to contact me if I can be of assistance to you.   Following are the goals we discussed today:   Goals Addressed             This Visit's Progress    Diabetes Management       Care Coordination Interventions: Provided education to patient about basic DM disease process Discussed plans with patient for ongoing care management follow up and provided patient with direct contact information for care management team Discussed importance of diet control of diabetes such as limiting sweets and carbohydrates.  Patient reports that her blood sugar has been up and down.  Blood sugar this am was 121.  However blood sugar has spiked to  the 200's.  Discussed diet and how what we eat affects blood sugars.  Patient has [place] 3 and needing help with setting it up. She states she has called Dr. Ronnie Derby office several times with no response.  Advised patient that CM would call.    Telephone call to Dr.Kumar's office.  Message left for return call.          Our next appointment is by telephone on 06/09/22 at 1030  Please call the care guide team at [phone] if you need to cancel or reschedule your appointment.   If you are experiencing a Mental Health or [place] or need someone to talk to, please call the Suicide and Crisis Lifeline: 988   Patient verbalizes understanding of instructions and care plan provided today and agrees to view in [place]. Active MyChart status and patient understanding of how to access instructions and care plan via MyChart confirmed with patient.     The patient has been provided with contact information for the care management team and has been advised to call with any health related questions or concerns.   Jone Baseman, RN, MSN [facility] Care Management Coordinator Direct Line [phone]

## 2022-05-13 NOTE — Patient Outreach (Signed)
  Care Coordination   Follow Up Visit Note   05/13/2022 Name: Marie [name] MRN: [id number] DOB: 26-Oct-1955  Marie [name] is a 67 y.o. year old female who sees Marie Maw, MD for primary care. I spoke with  Marie [name] by phone today.  What matters to the patients health and wellness today?  Getting  Libre [name] set up.      Goals Addressed             This Visit's Progress    Diabetes Management       Care Coordination Interventions: Provided education to patient about basic DM disease process Discussed plans with patient for ongoing care management follow up and provided patient with direct contact information for care management team Discussed importance of diet control of diabetes such as limiting sweets and carbohydrates.  Patient reports that her blood sugar has been up and down.  Blood sugar this am was 121.  However blood sugar has spiked to  the 200's.  Discussed diet and how what we eat affects blood sugars.  Patient has Marie [name] and needing help with setting it up. She states she has called Marie [name] office several times with no response.  Advised patient that CM would call.    Telephone call to Marie [name]'s office.  Message left for return call.          SDOH assessments and interventions completed:  Yes     Care Coordination Interventions:  Yes, provided   Follow up plan: Follow up call scheduled for March    Encounter Outcome:  Pt. Visit Completed   Marie Baseman, RN, MSN [facility] Care Management Coordinator Direct Line [phone]

## 2022-05-20 ENCOUNTER — Other Ambulatory Visit (INDEPENDENT_AMBULATORY_CARE_PROVIDER_SITE_OTHER): Payer: Medicare PPO

## 2022-05-20 DIAGNOSIS — E1165 Type 2 diabetes mellitus with hyperglycemia: Secondary | ICD-10-CM | POA: Diagnosis not present

## 2022-05-20 LAB — BASIC METABOLIC PANEL
BUN: 11 mg/dL (ref 6–23)
CO2: 31 mEq/L (ref 19–32)
Calcium: 9.8 mg/dL (ref 8.4–10.5)
Chloride: 101 mEq/L (ref 96–112)
Creatinine, Ser: 0.77 mg/dL (ref 0.40–1.20)
GFR: 79.51 mL/min (ref >60.00)
Glucose, Bld: 65 mg/dL — ABNORMAL LOW (ref 70–99)
Potassium: 4.3 mEq/L (ref 3.5–5.1)
Sodium: 140 mEq/L (ref 135–145)

## 2022-05-20 LAB — MICROALBUMIN / CREATININE URINE RATIO
Creatinine,U: 42.5 mg/dL
Microalb Creat Ratio: 1.9 mg/g (ref 0.0–30.0)
Microalb, Ur: 0.8 mg/dL (ref 0.0–1.9)

## 2022-05-20 LAB — HEMOGLOBIN A1C: Hgb A1c MFr Bld: 7.4 % — ABNORMAL HIGH (ref 4.6–6.5)

## 2022-05-25 ENCOUNTER — Encounter: Payer: Self-pay | Admitting: Endocrinology

## 2022-05-25 ENCOUNTER — Ambulatory Visit: Payer: Medicare PPO | Admitting: Endocrinology

## 2022-05-25 VITALS — BP 120/76 | HR 108 | Ht 62.0 in | Wt 136.6 lb

## 2022-05-25 DIAGNOSIS — E1165 Type 2 diabetes mellitus with hyperglycemia: Secondary | ICD-10-CM | POA: Diagnosis not present

## 2022-05-25 DIAGNOSIS — I10 Essential (primary) hypertension: Secondary | ICD-10-CM

## 2022-05-25 MED ORDER — DAPAGLIFLOZIN PROPANEDIOL 10 MG PO TABS: 10.0000 mg | ORAL_TABLET | Freq: Every day | ORAL | 1 refills | Status: DC

## 2022-05-25 NOTE — Progress Notes (Signed)
Patient ID: Marie [name], female   DOB: 04/11/1955, 67 y.o.   MRN: [id number]           Reason for Appointment: Follow-up for Type 2 Diabetes   History of Present Illness:          Date of diagnosis of type 2 diabetes mellitus: 2010       Background history:   She was likely treated with Metformin alone for the first few years and subsequently given Januvia In 2018 she was also given [place] in addition Current records indicate her highest A1c was 9.6 in 03/2017.  Has been progressively improving since then She was also started on Rybelsus in 02/2019  Recent history:   A1c is 7.4 compared to 6.9  Non-insulin hypoglycemic drugs the patient is taking are: Metformin 1.0 g daily, was on Farxiga 10 mg at night, Prandin 4-2-4 mg before meals    Current management, blood sugar patterns and problems identified  She was given Invokana instead of Farxiga since it was that time but recovered but she was told to request Wilder Glade again after she ran out but she did not She has likely not taken any SGLT2 drugs for at least a month now and did not call With this her blood sugars are generally higher but mostly after breakfast She is still eating only an egg and toast at breakfast time Generally fasting blood sugars are still fairly good Continues to have higher readings after dinner also especially when she is eating out  She did receive the [place] 3 app but she had difficulties getting this started on her phone She does remember to take her Prandin a few minutes before eating as directed Weight has gone up likely from not taking Farxiga Hypoglycemia with taking Prandin as directed She has been regular with her exercise on her home equipment        Side effects from medications have been: Decreased appetite from Rybelsus   Typical meal intake: Breakfast is egg, toast .              Exercise:  Exercise equipment at home using for about 20 min daily   Glucose monitoring:  Using freestyle  libre   Data for the last 2 weeks  Interpretation of the [place] 2 download for the last 14 days as follows  OVERNIGHT blood sugars are generally excellent between midnight-6 AM and then progressively rising at least until 10 AM Blood sugars show significant variability during the day especially after meals including sometimes after dinner However blood sugar data in the evenings after dinner is incomplete Blood sugars are rising fairly consistently after breakfast with readings frequently over 200 Blood sugars are also continuing to be generally higher till early afternoon and Premeal readings higher at dinner As above blood sugars are after dinner is incomplete but has a couple of significantly high readings up to about 225 Blood sugars are early morning before 6 AM and highest between 10 AM-12 PM   CGM use % of time   2-week average/GV 148  Time in range      72%  % Time Above 180 24+3  % Time above 250   % Time Below 70      PRE-MEAL Fasting Lunch Dinner Bedtime Overall  Glucose range:       Averages: 114       POST-MEAL PC Breakfast PC Lunch PC Dinner  Glucose range:     Averages: 186 163 159   Previously  CGM use % of time 63  2-week average/GV 136  Time in range       81%  % Time Above 180 14+4  % Time above 250   % Time Below 70 1     PRE-MEAL Fasting Lunch Dinner Bedtime Overall  Glucose range:       Averages: 119    136   POST-MEAL PC Breakfast PC Lunch PC Dinner  Glucose range:     Averages: 183  148     Dietician visit, most recent: 11/14  Weight history:   Wt Readings from Last 3 Encounters:  05/25/22 136 lb 9.6 oz (62 kg)  04/13/22 133 lb 6.4 oz (60.5 kg)  03/19/22 131 lb 9.6 oz (59.7 kg)    Glycemic control:   Lab Results  Component Value Date   HGBA1C 7.4 (H) 05/20/2022   HGBA1C 6.9 (H) 01/16/2022   HGBA1C 7.2 (H) 10/17/2021   Lab Results  Component Value Date   MICROALBUR 0.8 05/20/2022   LDLCALC 58 07/18/2021   CREATININE 0.77  05/20/2022   Lab Results  Component Value Date   MICRALBCREAT 1.9 05/20/2022    Lab Results  Component Value Date   FRUCTOSAMINE 262 02/14/2021   FRUCTOSAMINE 260 10/25/2020    Lab on 05/20/2022  Component Date Value Ref Range Status   Microalb, Ur 05/20/2022 0.8  0.0 - 1.9 mg/dL Final   Creatinine,U 05/20/2022 42.5  mg/dL Final   Microalb Creat Ratio 05/20/2022 1.9  0.0 - 30.0 mg/g Final   Sodium 05/20/2022 140  135 - 145 mEq/L Final   Potassium 05/20/2022 4.3  3.5 - 5.1 mEq/L Final   Chloride 05/20/2022 101  96 - 112 mEq/L Final   CO2 05/20/2022 31  19 - 32 mEq/L Final   Glucose, Bld 05/20/2022 65 (L)  70 - 99 mg/dL Final   BUN 05/20/2022 11  6 - 23 mg/dL Final   Creatinine, Ser 05/20/2022 0.77  0.40 - 1.20 mg/dL Final   GFR 05/20/2022 79.51  >60.00 mL/min Final   Calculated using the CKD-EPI Creatinine Equation (2021)   Calcium 05/20/2022 9.8  8.4 - 10.5 mg/dL Final   Hgb A1c MFr Bld 05/20/2022 7.4 (H)  4.6 - 6.5 % Final   Glycemic Control Guidelines for People with Diabetes:Non Diabetic:  <6%Goal of Therapy: <7%Additional Action Suggested:  >8%     Allergies as of 05/25/2022       Reactions   Penicillins Other (See Comments)   Driving couldn't see and head felt heavy  Has patient had a PCN reaction causing immediate rash, facial/tongue/throat swelling, SOB or lightheadedness with hypotension: No Has patient had a PCN reaction causing severe rash involving mucus membranes or skin necrosis: No Has patient had a PCN reaction that required hospitalization: No Has patient had a PCN reaction occurring within the last 10 years: No If all of the above answers are "NO", then may proceed with Cephalosporin use.        Medication List        Accurate as of May 25, 2022  2:52 PM. If you have any questions, ask your nurse or doctor.          ascorbic acid 500 MG tablet Commonly known as: VITAMIN C Take 500 mg by mouth daily.   aspirin EC 81 MG tablet Take 81 mg  by mouth at bedtime.   atorvastatin 80 MG tablet Commonly known as: LIPITOR TAKE 1 TABLET(80 MG) BY MOUTH DAILY   cyanocobalamin  2000 MCG tablet Take 2,000 mcg by mouth daily.   Dexcom G6 Sensor Misc Use to monitor blood sugar, change after 10 days   escitalopram 10 MG tablet Commonly known as: LEXAPRO TAKE 1 TABLET(10 MG) BY MOUTH DAILY   Farxiga 10 MG Tabs tablet Generic drug: dapagliflozin propanediol Take 5 mg by mouth daily.   glucose blood test strip 1 each by Other route as needed for other. Use as instructed   Invokana 100 MG Tabs tablet Generic drug: canagliflozin Take 100 mg by mouth daily.   lisinopril 40 MG tablet Commonly known as: ZESTRIL TAKE 1 TABLET EVERY DAY   metFORMIN 500 MG 24 hr tablet Commonly known as: GLUCOPHAGE-XR TAKE 2 TABLETS(1000 MG) BY MOUTH IN THE MORNING AND AT BEDTIME   multivitamin with minerals Tabs tablet Take 1 tablet by mouth daily.   repaglinide 2 MG tablet Commonly known as: PRANDIN TAKE 2 TABLETS BEFORE BREAKFAST, TAKE 1 TABLET AT LUNCH, AND TAKE 1 TABLET BEFORE DINNER   Vitamin D3 75 MCG (3000 UT) Tabs Generic drug: Cholecalciferol Take 3,000 Units by mouth at bedtime.        Allergies:  Allergies  Allergen Reactions   Penicillins Other (See Comments)    Driving couldn't see and head felt heavy  Has patient had a PCN reaction causing immediate rash, facial/tongue/throat swelling, SOB or lightheadedness with hypotension: No Has patient had a PCN reaction causing severe rash involving mucus membranes or skin necrosis: No Has patient had a PCN reaction that required hospitalization: No Has patient had a PCN reaction occurring within the last 10 years: No If all of the above answers are "NO", then may proceed with Cephalosporin use.     Past Medical History:  Diagnosis Date   CVA (cerebral vascular accident) ([place]) 2015   Depression    Diabetes ([place])    t2   Gallstones    Hyperlipemia    Hypertension     Migraines     Past Surgical History:  Procedure Laterality Date   BREAST BIOPSY Right 05/2019   CESAREAN SECTION     x2   CHOLECYSTECTOMY N/A 05/25/2016   Procedure: LAPAROSCOPIC CHOLECYSTECTOMY WITH INTRAOPERATIVE CHOLANGIOGRAM;  Surgeon: Johnathan Hausen, MD;  Location: [facility] ORS;  Service: General;  Laterality: N/A;   IR GENERIC HISTORICAL  03/13/2016   IR PERC CHOLECYSTOSTOMY 03/13/2016 Sandi Mariscal, MD [facility]INTER[facility] RAD   IR GENERIC HISTORICAL  04/28/2016   IR RADIOLOGIST EVAL & MGMT 04/28/2016 Jacqulynn Cadet, MD [facility] INTER[facility] RAD   LOOP RECORDER IMPLANT N/A 11/29/2013   Procedure: LOOP RECORDER IMPLANT;  Surgeon: Evans Lance, MD;  Location: [facility] CATH LAB;  Service: Cardiovascular;  Laterality: N/A;   TEE WITHOUT CARDIOVERSION N/A 08/24/2013   Procedure: TRANSESOPHAGEAL ECHOCARDIOGRAM (TEE);  Surgeon: Sanda Klein, MD;  Location: [facility] ENDOSCOPY;  Service: Cardiovascular;  Laterality: N/A;    Family History  Problem Relation Age of Onset   Stomach cancer Mother    High blood pressure Mother    Diabetes Mother    Diabetes Maternal Grandfather    High blood pressure Maternal Grandfather    Heart disease Neg Hx     Social History:  reports that she has never smoked. She has never used smokeless tobacco. She reports that she does not drink alcohol and does not use drugs.   Review of Systems    Lipid history: LDL controlled with atorvastatin 80 mg. Followed by PCP   Lab Results  Component Value Date   CHOL 128 07/18/2021  HDL 48.30 07/18/2021   LDLCALC 58 07/18/2021   LDLDIRECT 136.0 09/24/2020   TRIG 105.0 07/18/2021   CHOLHDL 3 07/18/2021           Hypertension: Has been present and treated with lisinopril 40 mg  Followed by PCP  BP Readings from Last 3 Encounters:  05/25/22 120/76  04/13/22 (!) 142/78  03/19/22 138/72    Most recent eye exam was in 8/23 with Dr. Zadie Rhine.  Has done proliferative retinopathy  Most recent foot exam: 3/22  Currently known complications  of diabetes: Retinopathy with decreased vision on the right, regularly followed by retinal surgery  Normal microalbumin in 9/22  History of CVA with left facial numbness and speech difficulty  LABS:  Lab on 05/20/2022  Component Date Value Ref Range Status   Microalb, Ur 05/20/2022 0.8  0.0 - 1.9 mg/dL Final   Creatinine,U 05/20/2022 42.5  mg/dL Final   Microalb Creat Ratio 05/20/2022 1.9  0.0 - 30.0 mg/g Final   Sodium 05/20/2022 140  135 - 145 mEq/L Final   Potassium 05/20/2022 4.3  3.5 - 5.1 mEq/L Final   Chloride 05/20/2022 101  96 - 112 mEq/L Final   CO2 05/20/2022 31  19 - 32 mEq/L Final   Glucose, Bld 05/20/2022 65 (L)  70 - 99 mg/dL Final   BUN 05/20/2022 11  6 - 23 mg/dL Final   Creatinine, Ser 05/20/2022 0.77  0.40 - 1.20 mg/dL Final   GFR 05/20/2022 79.51  >60.00 mL/min Final   Calculated using the CKD-EPI Creatinine Equation (2021)   Calcium 05/20/2022 9.8  8.4 - 10.5 mg/dL Final   Hgb A1c MFr Bld 05/20/2022 7.4 (H)  4.6 - 6.5 % Final   Glycemic Control Guidelines for People with Diabetes:Non Diabetic:  <6%Goal of Therapy: <7%Additional Action Suggested:  >8%     Physical Examination:  BP 120/76 (BP Location: Right Arm, Patient Position: Sitting, Cuff Size: Normal)   Pulse (!) 108   Ht 5' 2"$  (1.575 m)   Wt 136 lb 9.6 oz (62 kg)   SpO2 99%   BMI 24.98 kg/m        ASSESSMENT:  Diabetes type 2 nonobese  See history of present illness for detailed discussion of current diabetes management, blood sugar patterns and problems identified  A1c is 7.4  Current treatment regimen is Metformin 1000 mg,  and Prandin 2-4 mg, 3 times daily  Her blood glucose patterns were reviewed in detail from her freestyle libre version 2 download  She has overall high readings especially in the mornings after breakfast and sometimes after other meals This is likely to be from not taking her SGLT2 drug as discussed above, she did not get her Wilder Glade and did not request this as  instructed However she did not indicate she had been out of [place] until later in the visit Most of her visit was in counseling regarding possible use of mealtime insulin but she is very reluctant to consider doing any injections  Her time in target on her CGM is 72 compared to previously over 80%  Also she has difficulty starting the libre 3  HYPERTENSION: Blood pressure is better today  Microalbumin normal  PLAN:    Start Farxiga 5 mg daily May consider increasing the dose but she is concerned about the cost No change in Prandin Discussed again that if she continues to have high postprandial readings may try mealtime insulin She will follow-up with diabetes educator regarding starting the [place] 3 and  also review her day-to-day management   There are no Patient Instructions on file for this visit.  Total visit time including counseling = 30 minutes      Elayne Snare 05/25/2022, 2:52 PM   Note: This office note was prepared with Dragon voice recognition system technology. Any transcriptional errors that result from this process are unintentional.

## 2022-05-26 ENCOUNTER — Other Ambulatory Visit: Payer: Self-pay | Admitting: Endocrinology

## 2022-05-26 DIAGNOSIS — E11319 Type 2 diabetes mellitus with unspecified diabetic retinopathy without macular edema: Secondary | ICD-10-CM

## 2022-05-27 ENCOUNTER — Other Ambulatory Visit: Payer: Self-pay

## 2022-05-27 ENCOUNTER — Encounter: Payer: Self-pay | Admitting: Endocrinology

## 2022-05-27 ENCOUNTER — Ambulatory Visit (HOSPITAL_BASED_OUTPATIENT_CLINIC_OR_DEPARTMENT_OTHER): Payer: Medicare PPO | Admitting: Cardiology

## 2022-05-27 MED ORDER — FREESTYLE LIBRE 3 SENSOR MISC: 3 refills | Status: DC

## 2022-05-29 ENCOUNTER — Ambulatory Visit (HOSPITAL_BASED_OUTPATIENT_CLINIC_OR_DEPARTMENT_OTHER): Payer: Medicare PPO | Admitting: Cardiology

## 2022-05-29 ENCOUNTER — Encounter (HOSPITAL_BASED_OUTPATIENT_CLINIC_OR_DEPARTMENT_OTHER): Payer: Self-pay | Admitting: Cardiology

## 2022-05-29 VITALS — BP 134/81 | HR 80 | Ht 61.5 in | Wt 135.0 lb

## 2022-05-29 DIAGNOSIS — Z8673 Personal history of transient ischemic attack (TIA), and cerebral infarction without residual deficits: Secondary | ICD-10-CM | POA: Diagnosis not present

## 2022-05-29 DIAGNOSIS — I10 Essential (primary) hypertension: Secondary | ICD-10-CM

## 2022-05-29 DIAGNOSIS — Z5181 Encounter for therapeutic drug level monitoring: Secondary | ICD-10-CM

## 2022-05-29 DIAGNOSIS — E119 Type 2 diabetes mellitus without complications: Secondary | ICD-10-CM

## 2022-05-29 DIAGNOSIS — Z5982 Transportation insecurity: Secondary | ICD-10-CM | POA: Diagnosis not present

## 2022-05-29 MED ORDER — VALSARTAN 80 MG PO TABS: 80.0000 mg | ORAL_TABLET | Freq: Every day | ORAL | 3 refills | Status: DC

## 2022-05-29 NOTE — Patient Instructions (Addendum)
We initially talked about valsartan in August of 2022 (when you were out of lisinopril). This is a good medicine, a bit stronger than the lisinopril. This was never started by you based on discussions we had after the visit. You have been doing well on lisinopril since that time. We talked today about continuing the lisinopril vs. Change to the valsartan. You CANNOT take both medications together. This medication is heart and kidney protective in diabetes. We typically check blood work 2-3 weeks after starting this medication.   Labwork: BMET IN 2-3 WEEKS  Testing/Procedures: NONE  Follow-Up: 09/01/2022 4:20 PM WITH DR Harrell Gave   Any Other Special Instructions Will Be Listed Below (If Applicable).  WILL HAVE [facility] REACH OUT TO YOU ABOUT TRANSPORTATION    If you need a refill on your cardiac medications before your next appointment, please call your pharmacy.

## 2022-05-29 NOTE — Progress Notes (Signed)
Cardiology Office Note:    Date:  05/29/2022   ID:  Marie [name], DOB 03/27/1956, MRN [id number]  PCP:  Libby Maw, MD  Cardiologist:  Buford Dresser, MD  Referring MD: Libby Maw,*   CC: Follow up  History of Present Illness:    Marie [name] is a 67 y.o. female with a hx of CVA, hypertension, type II diabetes, dyslipidemia, carotid stenosis who is seen for follow up today. I initially met her 05/01/19 as a new patient to me/prior patient of Dr. Bettina Gavia for the evaluation and management of CVA and hypertension.  She was last seen by Dr. Bettina Gavia on 02/04/2018. She has a history of CVA. Workup unrevealing, loop recorder has not shown atrial fibrillation during the entire life of the device (implanted 2015). Her hypertension was well controlled on ACEi and amlodipine.  She was seen by me on 02/12/2021 with persistent issues controlling her blood sugars. She had recently started Repaglinide. Per her log, she often had low blood sugars in the 60s or 70s. She had issues with lightheadedness with standing too quickly. She noted occasional hot flashes as well.  At her last visit, she complained of blurred vision and increased tear production. She followed with Dr. Zadie Rhine. She had right eye surgery with Dr. Katy Fitch in the past but had difficulty getting in touch with him. She reported labile blood pressures at home. In clinic her first BP was 154/80 and on recheck it was 128/82. She complained of brief dizziness with sudden positional changes.  She called the office 03/16/2022 and reported elevated blood pressures in the [id number] systolic. She complained of ringing in her ears associated with weakness and vision loss. She therefore stopped driving and has been using [employer], [employer]. She was advised to follow-up with her PCP.  Today, she is accompanied by a family member. She reports that her blood pressure machine was found to be inaccurate at her PCP's office. In clinic today her BP  is 134/81.  She has a FreeStyle Libre 2 glucose sensor in her right arm. She also has a newer FreeStyle Libre 3 at home, but has had difficulty with finding a provider to assist with demonstrating how to use the new device. I assisted her with downloading the new app to her phone.  She denies any palpitations, chest pain, shortness of breath, or peripheral edema. No lightheadedness, headaches, syncope, orthopnea, or PND.   Past Medical History:  Diagnosis Date   CVA (cerebral vascular accident) ([place]) 2015   Depression    Diabetes ([place])    t2   Gallstones    Hyperlipemia    Hypertension    Migraines     Past Surgical History:  Procedure Laterality Date   BREAST BIOPSY Right 05/2019   CESAREAN SECTION     x2   CHOLECYSTECTOMY N/A 05/25/2016   Procedure: LAPAROSCOPIC CHOLECYSTECTOMY WITH INTRAOPERATIVE CHOLANGIOGRAM;  Surgeon: Johnathan Hausen, MD;  Location: [facility]S;  Service: General;  Laterality: N/A;   IR GENERIC HISTORICAL  03/13/2016   IR PERC CHOLECYSTOSTOMY 03/13/2016 Sandi Mariscal, MD [facility]INTERV RAD   IR GENERIC HISTORICAL  04/28/2016   IR RADIOLOGIST EVAL & MGMT 04/28/2016 Jacqulynn Cadet, MD [facility] INTER[facility] RAD   LOOP RECORDER IMPLANT N/A 11/29/2013   Procedure: LOOP RECORDER IMPLANT;  Surgeon: Evans Lance, MD;  [facility] CATH LAB;  Service: Cardiovascular;  Laterality: N/A;   TEE WITHOUT CARDIOVERSION N/A 08/24/2013   Procedure: TRANSESOPHAGEAL ECHOCARDIOGRAM (TEE);  Surgeon: Sanda Klein, MD;  Location: [facility]  ENDOSCOPY;  Service: Cardiovascular;  Laterality: N/A;    Current Medications: Current Outpatient Medications on File Prior to Visit  Medication Sig   ascorbic acid (VITAMIN C) 500 MG tablet Take 500 mg by mouth daily.   aspirin EC 81 MG tablet Take 81 mg by mouth at bedtime.   atorvastatin (LIPITOR) 80 MG tablet TAKE 1 TABLET(80 MG) BY MOUTH DAILY   Cholecalciferol (VITAMIN D3) 3000 units TABS Take 3,000 Units by mouth at bedtime.   Continuous Blood Gluc Sensor  (FREESTYLE LIBRE 3 SENSOR) MISC Place 1 sensor on the skin every 14 days. Use to check glucose continuously   cyanocobalamin 2000 MCG tablet Take 2,000 mcg by mouth daily.   dapagliflozin propanediol (FARXIGA) 10 MG TABS tablet Take 1 tablet (10 mg total) by mouth daily.   escitalopram (LEXAPRO) 10 MG tablet TAKE 1 TABLET(10 MG) BY MOUTH DAILY   glucose blood test strip 1 each by Other route as needed for other. Use as instructed   metFORMIN (GLUCOPHAGE-XR) 500 MG 24 hr tablet TAKE 1 TABLETS IN THE MORNING AND AT BEDTIME   Multiple Vitamin (MULTIVITAMIN WITH MINERALS) TABS tablet Take 1 tablet by mouth daily.   repaglinide (PRANDIN) 2 MG tablet TAKE 2 TABLETS BEFORE BREAKFAST, TAKE 1 TABLET AT LUNCH, AND TAKE 1 TABLET BEFORE DINNER   No current facility-administered medications on file prior to visit.     Allergies:   Penicillins   Social History   Tobacco Use   Smoking status: Never   Smokeless tobacco: Never  Vaping Use   Vaping Use: Never used  Substance Use Topics   Alcohol use: No    Alcohol/week: 1.0 standard drink of alcohol    Types: 1 Standard drinks or equivalent per week    Comment: Social   Drug use: No    Family History: family history includes Diabetes in her maternal grandfather and mother; High blood pressure in her maternal grandfather and mother; Stomach cancer in her mother. There is no history of Heart disease.  ROS:   Please see the history of present illness.   Additional pertinent ROS otherwise unremarkable.   EKGs/Labs/Other Studies Reviewed:    The following studies were reviewed today:  TEE 08/24/13 - Left ventricle: Systolic function was normal. Wall motion was    normal; there were no regional wall motion abnormalities.  - Left atrium: No evidence of thrombus in the atrial cavity or    appendage.  - Right atrium: No evidence of thrombus in the atrial cavity or    appendage.  - Atrial septum: No defect or patent foramen ovale was identified.     Echo contrast study showed no right-to-left atrial level shunt,    at baseline or with provocation. Echo contrast study showed no    right-to-left shunt, following an increase in RA pressure induced    by provocative maneuvers.    EKG:  EKG is personally reviewed.   05/29/2022:  EKG was not ordered. 02/06/2022: NSR at 88 bpm 02/12/2021: EKG was not ordered. 11/04/2020: NSR at 74 bpm. 11/20/2019: EKG was not ordered. 05/01/19: Sinus rhythm with sinus arrhythmia.  Recent Labs: 07/18/2021: ALT 33 03/19/2022: Hemoglobin 13.2; Platelets 200.0 04/13/2022: TSH 2.87 05/20/2022: BUN 11; Creatinine, Ser 0.77; Potassium 4.3; Sodium 140   Recent Lipid Panel    Component Value Date/Time   CHOL 128 07/18/2021 0919   TRIG 105.0 07/18/2021 0919   HDL 48.30 07/18/2021 0919   CHOLHDL 3 07/18/2021 0919   VLDL 21.0 07/18/2021 0919  [place]ers 58 07/18/2021 0919   LDLDIRECT 136.0 09/24/2020 0850    Physical Exam:    VS:  BP 134/81   Pulse 80   Ht 5' 1.5" (1.562 m)   Wt 135 lb (61.2 kg)   BMI 25.09 kg/m     Wt Readings from Last 3 Encounters:  05/29/22 135 lb (61.2 kg)  05/25/22 136 lb 9.6 oz (62 kg)  04/13/22 133 lb 6.4 oz (60.5 kg)    GEN: Well nourished, well developed in no acute distress HEENT: Normal, moist mucous membranes NECK: No JVD CARDIAC: regular rhythm, normal S1 and S2, no rubs or gallops. No murmur. VASCULAR: Radial and DP pulses 2+ bilaterally. No carotid bruits RESPIRATORY:  Clear to auscultation without rales, wheezing or rhonchi  ABDOMEN: Soft, non-tender, non-distended MUSCULOSKELETAL:  Ambulates independently SKIN: Warm and dry, no edema NEUROLOGIC:  Alert and oriented x 3. No focal neuro deficits noted. PSYCHIATRIC:  Normal affect   ASSESSMENT:    1. Essential hypertension   2. Therapeutic drug monitoring   3. History of CVA (cerebrovascular accident)   4. Type 2 diabetes mellitus without obesity ([place])   5. Lack of access to transportation     PLAN:     History of CVA:  -no further strokes -no detection of afib on loop -continue aspirin, atorvastatin  Hypertension: goal <130/80 -we spent extensive time today trying to get her [employer] issue resolved. She reports being on the phone for hours before, and it was not able to be returned where she purchased it. I filed an online complaint on her behalf -she found old valsartan from two years ago (never took it). Had questions about valsartan vs. Lisinopril. We discussed this at length today. Reiterated that she cannot use both. She has done well on lisinopril, but after discussion she would like to change to valsartan. Changed today. Repeat BMET ordered, given transportation issues will try to make this easier for her to have done. Will also see if we can assist with transportation -we got her a new monitor today to use at home  Type II diabetes, without obesity, with history of ASCVD: -she is already on SGLT2i (dapagliflozin) -with history of stroke, would recommend GLP1RA, but this was stopped in the past. Defer to her diabetes team on this. Also on metformin and repaglinide currently, followed closely -she is not sure how to upgrade to her new Libre3.Transportation is very difficult for her (she has to [place], and today she has her son with developmental delay with her). We worked together today and figured out how to [place] and use the new app  Cardiac risk counseling and secondary prevention recommendations: -recommend heart healthy/Mediterranean diet, with whole grains, fruits, vegetable, fish, lean meats, nuts, and olive oil. Limit salt. -recommend moderate walking, 3-5 times/week for 30-50 minutes each session. Aim for at least 150 minutes.week. Goal should be pace of 3 miles/hours, or walking 1.5 miles in 30 minutes -recommend avoidance of tobacco products. Avoid excess alcohol.  Plan for follow up: 3 mos  Total time of encounter: 68 minutes total time of encounter, including 58 minutes  spent in face-to-face patient care. This time includes coordination of care and counseling regarding above conditions. Remainder of non-face-to-face time involved reviewing chart documents/testing relevant to the patient encounter and documentation in the medical record.  Buford Dresser, MD, PhD, [facility]    Medication Adjustments/Labs and Tests Ordered: Current medicines are reviewed at length with the patient today.  Concerns regarding  medicines are outlined above.   Orders Placed This Encounter  Procedures   Basic metabolic panel   Meds ordered this encounter  Medications   valsartan (DIOVAN) 80 MG tablet    Sig: Take 1 tablet (80 mg total) by mouth daily.    Dispense:  90 tablet    Refill:  3   Patient Instructions  We initially talked about valsartan in August of 2022 (when you were out of lisinopril). This is a good medicine, a bit stronger than the lisinopril. This was never started by you based on discussions we had after the visit. You have been doing well on lisinopril since that time. We talked today about continuing the lisinopril vs. Change to the valsartan. You CANNOT take both medications together. This medication is heart and kidney protective in diabetes. We typically check blood work 2-3 weeks after starting this medication.   Labwork: BMET IN 2-3 WEEKS  Testing/Procedures: NONE  Follow-Up: 09/01/2022 4:20 PM WITH DR Harrell Gave   Any Other Special Instructions Will Be Listed Below (If Applicable).  WILL HAVE [place] REACH OUT TO YOU ABOUT TRANSPORTATION    If you need a refill on your cardiac medications before your next appointment, please call your pharmacy.  I,Mathew Stumpf,acting as a Education administrator for [employer], MD.,have documented all relevant documentation on the behalf of Buford Dresser, MD,as directed by  Buford Dresser, MD while in the presence of Buford Dresser, MD.  I, Buford Dresser,  MD, have reviewed all documentation for this visit. The documentation on 05/29/22 for the exam, diagnosis, procedures, and orders are all accurate and complete.   Signed, Buford Dresser, MD PhD 05/29/2022  [place]

## 2022-06-01 ENCOUNTER — Telehealth: Payer: Self-pay | Admitting: Licensed Clinical Social Worker

## 2022-06-01 DIAGNOSIS — I10 Essential (primary) hypertension: Secondary | ICD-10-CM

## 2022-06-01 NOTE — Addendum Note (Signed)
Addended by: Alexander Mt on: 06/01/2022 03:19 PM   Modules accepted: Orders

## 2022-06-01 NOTE — Progress Notes (Addendum)
Heart and Vascular Care Navigation  06/01/2022  [name] [birth year] [id number]  Reason for Referral: transportation Patient is participating in a Managed Medicaid Plan:  Engaged with patient by telephone for initial visit for Heart and Vascular Care Coordination.                                                                                                   Assessment:                                     LCSW received call back from pt. Introduced self, role, reason for call. Pt confirmed home address, PCP, she lives with her adult son Marie [name] who has Down syndrome. Her husband is in [place] working, her other two adult sons listed are local and help as able. Pt is able to walk to her PCP office but specialty offices are too far therefore she often takes [employer]. Her son takes Marie [name] (formerly [employer]) to work three days a week, she had contacted that office to see if she was eligible but unfortunately due to her mobility and Medicare only they have said she was not eligible. We discussed community resources through [employer] [place], and other local non-profits. We also dicussed that pt may have some benefits for rides through her [facility] benefits.   Pt shares that she has no issues affording and accessing her medications, she is able to obtain and afford food. The one thing that causes her stress is her mortgage as it is at a high rate which she has tried to update at a lower rate but has been unsuccessful getting through with her current Marie [name]. LCSW shared I would look into community resources and see if any additional support available.   No additional questions/concerns at this time. Will also refer to [facility] as they appear to be active on care team and could assist with rides as needed to PCP office.       HRT/VAS Care Coordination     Patients Home Cardiology Office --  [facility]   Outpatient Care Team Social Worker   Social Worker Name:  Westley Hummer, [place], [place]   Living arrangements for the past 2 months Single Family Home   Lives with: Adult Children   Patient Current Insurance Coverage Managed Medicare   Patient Has Concern With [place] No   Does Patient Have Prescription Coverage? Yes   Home Assistive Devices/Equipment CBG Meter   [place]       Social History:                                                                             SDOH Screenings  Food Insecurity: No Food Insecurity (06/01/2022)  Housing: Medium Risk (06/01/2022)  Transportation Needs: No Transportation Needs (06/01/2022)  Utilities: Not At Risk (06/01/2022)  Depression (PHQ2-9): Low Risk  (04/13/2022)  Financial Resource Strain: Medium Risk (06/01/2022)  Tobacco Use: Low Risk  (05/29/2022)    SDOH Interventions: Financial Resources:  Financial Strain Interventions: Other (Comment) (limited income- credit counseling through [employer], transportation resources, food resources)  Food Insecurity:  Food Insecurity Interventions: Intervention Not Indicated  Housing Insecurity:  Housing Interventions: Other (Comment) (credit counseling information from [place] for looking into mortgage options)  Transportation:   Transportation Interventions: Water engineer, Other (Comment) (transportation packet of community resources)    Other Care Navigation Interventions:     Provided Pharmacy assistance resources  Pt denies any affordability/accessibility challenges    Follow-up plan:   LCSW sent pt a copy of transportation resources, my card, Credit Counseling through [place] for mortgage concerns. LCSW remains available.

## 2022-06-01 NOTE — Telephone Encounter (Addendum)
H&V Care Navigation CSW Progress Note  Clinical Social Worker contacted patient by phone to f/u on referral for transportation and other resources. No answer this morning at [phone], was unable to leave voicemail as mailbox is full, will re-attempt as able.   Patient is participating in a Managed Medicaid Plan:  No, Hum[employer] Medicare only.   SDOH Screenings   Transportation Needs: No Transportation Needs (01/14/2022)  Utilities: Not At Risk (01/14/2022)  Depression (PHQ2-9): Low Risk  (04/13/2022)  Tobacco Use: Low Risk  (05/29/2022)   Westley Hummer, MSW, [place]  [phone]- work cell phone (preferred) [phone]- desk phone

## 2022-06-03 ENCOUNTER — Telehealth: Payer: Self-pay | Admitting: *Deleted

## 2022-06-03 NOTE — Telephone Encounter (Signed)
   Telephone encounter was:  Unsuccessful.  06/03/2022 Name: Marie [name] MRN: [id number] DOB: 28-Sep-1955  Unsuccessful outbound call made today to assist with:  Transportation Needs   Outreach Attempt:  1st Attempt  Mailbox is full  [place] [address] , [place] [address] Email : [email or web address]

## 2022-06-04 ENCOUNTER — Telehealth: Payer: Self-pay | Admitting: *Deleted

## 2022-06-04 NOTE — Telephone Encounter (Signed)
   Telephone encounter was:  Successful.  06/04/2022 Name: Marie [name] MRN: [id number] DOB: January 18, 1956  Marie [name] is a 67 y.o. year old female who is a primary care patient of Libby Maw, MD . The community resource team was consulted for assistance with Transportation Needs   Care guide performed the following interventions: Patient provided with information about care guide support team and interviewed to confirm resource needs. Mailed access [employer] and will call back to schedule the [facility] transportation for 2 upcoming appts . Will call back to schedule them as she is at grocery store  Follow Up Plan:  Care guide will follow up with patient by phone over the next day  [place] [address] , [place] [address] Email : [email or web address]

## 2022-06-04 NOTE — Telephone Encounter (Signed)
   Telephone encounter was:  Successful.  06/04/2022 Name: Marie [name] MRN: [id number] DOB: November 20, 1955  Marie [name] is a 67 y.o. year old female who is a primary care patient of Libby Maw, MD . The community resource team was consulted for assistance with Transportation Needs   Care guide performed the following interventions: Follow up call placed to the patient to discuss status of referral. Scheduled 2 rides for [facility] and also mailed [employer] and application  Follow Up Plan:  No further follow up planned at this time. The patient has been provided with needed resources. Marland Kitchenj

## 2022-06-08 ENCOUNTER — Ambulatory Visit: Payer: Self-pay

## 2022-06-08 ENCOUNTER — Encounter (INDEPENDENT_AMBULATORY_CARE_PROVIDER_SITE_OTHER): Payer: Medicare HMO | Admitting: Ophthalmology

## 2022-06-08 ENCOUNTER — Telehealth: Payer: Self-pay | Admitting: Licensed Clinical Social Worker

## 2022-06-08 ENCOUNTER — Encounter (HOSPITAL_BASED_OUTPATIENT_CLINIC_OR_DEPARTMENT_OTHER): Payer: Self-pay | Admitting: Cardiology

## 2022-06-08 DIAGNOSIS — H26491 Other secondary cataract, right eye: Secondary | ICD-10-CM | POA: Diagnosis not present

## 2022-06-08 DIAGNOSIS — H2512 Age-related nuclear cataract, left eye: Secondary | ICD-10-CM | POA: Diagnosis not present

## 2022-06-08 DIAGNOSIS — H35371 Puckering of macula, right eye: Secondary | ICD-10-CM | POA: Diagnosis not present

## 2022-06-08 DIAGNOSIS — H35021 Exudative retinopathy, right eye: Secondary | ICD-10-CM | POA: Diagnosis not present

## 2022-06-08 DIAGNOSIS — E113513 Type 2 diabetes mellitus with proliferative diabetic retinopathy with macular edema, bilateral: Secondary | ICD-10-CM | POA: Diagnosis not present

## 2022-06-08 LAB — HM DIABETES EYE EXAM

## 2022-06-08 NOTE — Telephone Encounter (Signed)
H&V Care Navigation CSW Progress Note  Clinical Social Worker contacted patient by phone to f/u on message left while out of office. Pt confirmed she received our community transportation resources and we discussed which ones may be of use to her if she remains ineligible for [employer]. Pt shares she still hasn't received [employer] application but has been checking the mail for it. Encouraged her to let me know if she has any additional questions or needs assistance with completing the application once it has arrived.   Patient is participating in a Managed Medicaid Plan:  No, Humana Medicare  [place]: No Food Insecurity (06/01/2022)  Housing: Medium Risk (06/01/2022)  Transportation Needs: No Transportation Needs (06/01/2022)  Utilities: Not At Risk (06/01/2022)  Depression (PHQ2-9): Low Risk  (04/13/2022)  Financial Resource Strain: Medium Risk (06/01/2022)  Tobacco Use: Low Risk  (05/29/2022)   Westley Hummer, MSW, [place]  [phone]- work cell phone (preferred) [phone]- desk phone

## 2022-06-08 NOTE — Patient Outreach (Signed)
  Care Coordination   06/08/2022 Name: Marie [name] MRN: [id number] DOB: 03-19-1956   Care Coordination Outreach Attempts:  An unsuccessful telephone outreach was attempted today to offer the patient information about available care coordination services as a benefit of their health plan.   Follow Up Plan:  Additional outreach attempts will be made to offer the patient care coordination information and services.   Encounter Outcome:  No Answer    Care Coordination Interventions:  No, not indicated    Jone Baseman, RN, MSN [place] Management Care Management Coordinator Direct Line [phone]

## 2022-06-10 ENCOUNTER — Ambulatory Visit: Payer: Self-pay

## 2022-06-10 NOTE — Patient Outreach (Signed)
  Care Coordination   Follow Up Visit Note   06/10/2022 Name: Marie [name] MRN: [id number] DOB: June 28, 1955  Marie [name] is a 67 y.o. year old female who sees Libby Maw, MD for primary care. I spoke with  [name] by phone today.  What matters to the patients health and wellness today?  Following diabetic diet.  Discussed different things to eat to help control sugars.   Patient working with care guide for transportation. Waiting for access [place] application.     Goals Addressed             This Visit's Progress    Diabetes Management       Patient Goals/Self Care Activities: -Patient/Caregiver will self-administer medications as prescribed as evidenced by self-report/primary caregiver report  -Patient/Caregiver will attend all scheduled provider appointments as evidenced by clinician review of documented attendance to scheduled appointments and patient/caregiver report -Patient/Caregiver will call provider office for new concerns or questions as evidenced by review of documented incoming telephone call notes and patient report  -check blood sugar at prescribed times -check blood sugar if I feel it is too high or too low -record values and write them down take them to all doctor visits  -limit carbohydrates and sweets   Interventions Today    Flowsheet Row Most Recent Value  Chronic Disease   Chronic disease during today's visit Other, Diabetes  [CVA]  General Interventions   General Interventions Discussed/Reviewed General Interventions Discussed, Doctor Visits  Doctor Visits Discussed/Reviewed Doctor Visits Discussed, PCP  PCP/Specialist Visits Compliance with follow-up visit  Exercise Interventions   Exercise Discussed/Reviewed Exercise Discussed  Education Interventions   Education Provided Provided Education  Provided Verbal Education On When to see the doctor  Nutrition Interventions   Nutrition Discussed/Reviewed Decreasing sugar intake, Supplmental  nutrition             SDOH assessments and interventions completed:  Yes     Care Coordination Interventions:  Yes, provided   Follow up plan: Follow up call scheduled for 07/02/22    Encounter Outcome:  Pt. Visit Completed   Jone Baseman, RN, MSN [place] Management Care Management Coordinator Direct Line [phone]

## 2022-06-10 NOTE — Patient Instructions (Signed)
Visit Information  Thank you for taking time to visit with me today. Please don't hesitate to contact me if I can be of assistance to you.   Following are the goals we discussed today:   Goals Addressed             This Visit's Progress    Diabetes Management       Patient Goals/Self Care Activities: -Patient/Caregiver will self-administer medications as prescribed as evidenced by self-report/primary caregiver report  -Patient/Caregiver will attend all scheduled provider appointments as evidenced by clinician review of documented attendance to scheduled appointments and patient/caregiver report -Patient/Caregiver will call provider office for new concerns or questions as evidenced by review of documented incoming telephone call notes and patient report  -check blood sugar at prescribed times -check blood sugar if I feel it is too high or too low -record values and write them down take them to all doctor visits  -limit carbohydrates and sweets   Interventions Today    Flowsheet Row Most Recent Value  Chronic Disease   Chronic disease during today's visit Other, Diabetes  [CVA]  General Interventions   General Interventions Discussed/Reviewed General Interventions Discussed, Doctor Visits  Doctor Visits Discussed/Reviewed Doctor Visits Discussed, PCP  PCP/Specialist Visits Compliance with follow-up visit  Exercise Interventions   Exercise Discussed/Reviewed Exercise Discussed  Education Interventions   Education Provided Provided Education  Provided Verbal Education On When to see the doctor  Nutrition Interventions   Nutrition Discussed/Reviewed Decreasing sugar intake, Supplmental nutrition             Our next appointment is by telephone on 07/02/22 at 1030  Please call the care guide team at [phone] if you need to cancel or reschedule your appointment.   If you are experiencing a Mental Health or [place] or need someone to talk to, please call  the Suicide and Crisis Lifeline: 988   Patient verbalizes understanding of instructions and care plan provided today and agrees to view in [place]. Active MyChart status and patient understanding of how to access instructions and care plan via MyChart confirmed with patient.     The patient has been provided with contact information for the care management team and has been advised to call with any health related questions or concerns.   Jone Baseman, RN, MSN [place] [facility] Care Management Coordinator Direct Line [phone]

## 2022-06-11 ENCOUNTER — Ambulatory Visit: Payer: Medicare PPO | Admitting: Neurology

## 2022-06-11 ENCOUNTER — Encounter: Payer: Self-pay | Admitting: Neurology

## 2022-06-11 VITALS — BP 151/79 | HR 93 | Ht 61.0 in | Wt 135.0 lb

## 2022-06-11 DIAGNOSIS — F32A Depression, unspecified: Secondary | ICD-10-CM | POA: Diagnosis not present

## 2022-06-11 DIAGNOSIS — R202 Paresthesia of skin: Secondary | ICD-10-CM | POA: Insufficient documentation

## 2022-06-11 MED ORDER — TRAZODONE HCL 50 MG PO TABS: 50.0000 mg | ORAL_TABLET | Freq: Every day | ORAL | 11 refills | Status: DC

## 2022-06-11 NOTE — Progress Notes (Signed)
Chief Complaint  Patient presents with   New Patient (Initial Visit)    Rm 15, alone internal referral for abnormal gait, neuropathy, weakness Weakness and numbness in both feet, unable to feel petals in car to dive. Difficulty walking unsteady gait       ASSESSMENT AND PLAN  Marie [name] is a 67 y.o. female  Bilateral feet paresthesia  Mildly length-dependent sensory changes but there was no distal weakness,  Long history of diabetes, under suboptimal control, most recent A1c 7.4,  Most suggestive of neuropathy, EMG nerve conduction study  History of stroke  Vascular risk factor of sedentary lifestyle, hypertension, hyperlipidemia, diabetes,  She has stopped aspirin for unknown reasons, advised her to go back  Depression anxiety, chronic insomnia,  Add on trazodone 25 to 50 mg every night    DIAGNOSTIC DATA (LABS, IMAGING, TESTING) - I reviewed patient records, labs, notes, testing and imaging myself where available.   MEDICAL HISTORY:  Marie [name] is a 66 year old female, seen in request by her primary care physician Dr.   Ethelene Hal, [place] [facility], for evaluation of bilateral feet paresthesia  I reviewed and summarized the referring note. PMHX. HLD DM Depression Stroke, but has stopped taking anti-platelet medication   I saw her in the past for stroke, on Feb 28th 2015, she had acute onset speech difficulty. MRI showed acute stroke at left MCA territory,  roughly 1 x 2 cm cross-section, affects the left frontal operculum and underlying white matter near Broca's area. Possible subacute ischemia left frontal periventricular white matter; Chronic appearing infarcts of the bilateral cerebellar hemispheres,  right greater than left, as well as the right posterior temporal cortex. Hemorrhagic lacunar infarct right posterior putaminal. lacunar infarct right ventral medial thalamus,  lacunar infarct right parietal subcortical white matter. 22 x 26 x 16 mm left frontal  extra-axial mass lesion, likely cystic meningioma.   MRA showed diffuse intracranial atherosclerotic disease disease. Most severe disease of both posterior cerebral arteries  Lab showed A1C 9.4, normal CBC, CMP, VitD.    Ultrasound of carotid artery was done by Dr. Reesa Chew office in March 2015, less than 40% stenosis of bilateral internal carotid artery.  She has history of HTN, HLD, DM, depression, but not complian with her medications,  She had acute onset of dizziness in April 2015, repeat MRI showed acute and subacute right inferior cerebellar ischemic infarctions, adjacent to cystic encephalomalacia from chronic right cerebellar infarcts.  Additional chronic ischemic disease: Small chronic infarcts noted in the left cerebellum. Chronic ischemic infarcts in the right posterior temporal and left opercular-frontal cortical regions. Mild scattered chronic small vessel ischemic disease in the subcortical and periventricular white matter.  Stable left anterior frontal extra-axial dural based multi-cystic lesion, likely a cystic meningioma (2.2x1.6x1.8cm, APxtransxSI).  Compared to prior MRI from 06/06/13, there are new acute infarcts in the right cerebellum. The overall pattern of infarcts is suspicious for cardio-aortic embolic etiology.   She had negative TEE   Follow up by her cardiologist Dr. 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,    She had loop recorder in September 2015,   Later follow-up she has constellation of symptoms, today she revealed, she is in the process of finalizing her divorce, husband went back to [place], she has 3 adult children, she lives with her Down syndrome son,  For reasons unknown, she has stopped taking aspirin, good  days and bad days, sometimes she feels so weak needing help getting out of bed, not sleeping well, continues suffer significant  anxiety   She also complains of bilateral feet numbness tingling, sometimes could not feel her feet on the pedal, stopped driving recently, taking Uber to clinic today   PHYSICAL EXAM:   Vitals:   06/11/22 1047  BP: (!) 151/79  Pulse: 93  Weight: 135 lb (61.2 kg)  Height: 5\' 1"  (1.549 m)   Not recorded     Body mass index is 25.51 kg/m.  PHYSICAL EXAMNIATION:  Gen: NAD, conversant, well nourised, well groomed                     Cardiovascular: Regular rate rhythm, no peripheral edema, warm, nontender. Eyes: Conjunctivae clear without exudates or hemorrhage Neck: Supple, no carotid bruits. Pulmonary: Clear to auscultation bilaterally   NEUROLOGICAL EXAM:  MENTAL STATUS: Speech/cognition: Awake, alert, oriented to history taking and casual conversation depressed looking female, CRANIAL NERVES: CN II: Visual fields are full to confrontation. Pupils are round equal and briskly reactive to light. CN III, IV, VI: extraocular movement are normal. No ptosis. CN V: Facial sensation is intact to light touch CN VII: Face is symmetric with normal eye closure  CN VIII: Hearing is normal to causal conversation. CN IX, X: Phonation is normal. CN XI: Head turning and shoulder shrug are intact  MOTOR: There is no pronator drift of out-stretched arms. Muscle bulk and tone are normal. Muscle strength is normal.  REFLEXES: Reflexes are 1 and symmetric at the biceps, triceps, knees, and absent at ankles. Plantar responses are flexor.  SENSORY: Length-dependent decreased to light touch, pinprick, vibratory sensation to ankle level  COORDINATION: There is no trunk or limb dysmetria noted.  GAIT/STANCE: Posture is normal. Gait is steady with normal steps, base, arm swing, and turning.  Able to stand up on heels and toes, REVIEW OF SYSTEMS:  Full 14 system review of systems performed and notable only for as above All other review of systems were negative.   ALLERGIES: Allergies   Allergen Reactions   Penicillins Other (See Comments)    Driving couldn't see and head felt heavy  Has patient had a PCN reaction causing immediate rash, facial/tongue/throat swelling, SOB or lightheadedness with hypotension: No Has patient had a PCN reaction causing severe rash involving mucus membranes or skin necrosis: No Has patient had a PCN reaction that required hospitalization: No Has patient had a PCN reaction occurring within the last 10 years: No If all of the above answers are "NO", then may proceed with Cephalosporin use.     HOME MEDICATIONS: Current Outpatient Medications  Medication Sig Dispense Refill   ascorbic acid (VITAMIN C) 500 MG tablet Take 500 mg by mouth daily.     atorvastatin (LIPITOR) 80 MG tablet TAKE 1 TABLET(80 MG) BY MOUTH DAILY 90 tablet 3   Cholecalciferol (VITAMIN D3) 3000 units TABS Take 3,000 Units by mouth at bedtime.     Continuous Blood Gluc Sensor (FREESTYLE LIBRE 3 SENSOR) MISC Place 1 sensor on the skin every 14 days. Use to check glucose continuously 6 each 3   dapagliflozin propanediol (FARXIGA) 10 MG TABS tablet Take 1 tablet (10 mg total) by mouth daily. 90 tablet 1   escitalopram (LEXAPRO) 10 MG tablet TAKE 1 TABLET(10 MG) BY MOUTH DAILY 90 tablet 1   metFORMIN (GLUCOPHAGE-XR) 500 MG 24 hr tablet TAKE 1 TABLETS IN THE MORNING AND AT BEDTIME 180 tablet  3   Multiple Vitamin (MULTIVITAMIN WITH MINERALS) TABS tablet Take 1 tablet by mouth daily.     repaglinide (PRANDIN) 2 MG tablet TAKE 2 TABLETS BEFORE BREAKFAST, TAKE 1 TABLET AT LUNCH, AND TAKE 1 TABLET BEFORE DINNER 360 tablet 3   valsartan (DIOVAN) 80 MG tablet Take 1 tablet (80 mg total) by mouth daily. 90 tablet 3   No current facility-administered medications for this visit.    PAST MEDICAL HISTORY: Past Medical History:  Diagnosis Date   CVA (cerebral vascular accident) ([place]) 2015   Depression    Diabetes ([place])    t2   Gallstones    Hyperlipemia    Hypertension    Migraines      PAST SURGICAL HISTORY: Past Surgical History:  Procedure Laterality Date   BREAST BIOPSY Right 05/2019   CESAREAN SECTION     x2   CHOLECYSTECTOMY N/A 05/25/2016   Procedure: LAPAROSCOPIC CHOLECYSTECTOMY WITH INTRAOPERATIVE CHOLANGIOGRAM;  Surgeon: Johnathan Hausen, MD;  Location: [facility] ORS;  Service: General;  Laterality: N/A;   IR GENERIC HISTORICAL  03/13/2016   IR PERC CHOLECYSTOSTOMY 03/13/2016 Sandi Mariscal, MD [facility]INTERV RAD   IR GENERIC HISTORICAL  04/28/2016   IR RADIOLOGIST EVAL & MGMT 04/28/2016 Jacqulynn Cadet, MD [facility] INTER[facility] RAD   LOOP RECORDER IMPLANT N/A 11/29/2013   Procedure: LOOP RECORDER IMPLANT;  Surgeon: Evans Lance, MD;  Location[facility] CATH LAB;  Service: Cardiovascular;  Laterality: N/A;   TEE WITHOUT CARDIOVERSION N/A 08/24/2013   Procedure: TRANSESOPHAGEAL ECHOCARDIOGRAM (TEE);  Surgeon: Sanda Klein, MD;  Location: [facility] ENDOSCOPY;  Service: Cardiovascular;  Laterality: N/A;    FAMILY HISTORY: Family History  Problem Relation Age of Onset   Stomach cancer Mother    High blood pressure Mother    Diabetes Mother    Diabetes Maternal Grandfather    High blood pressure Maternal Grandfather    Heart disease Neg Hx     SOCIAL HISTORY: Social History   Socioeconomic History   Marital status: Married    Spouse name: Not on file   Number of children: 3   Years of education: MBA   Highest education level: Not on file  Occupational History    Employer: [employer]    Comment: [employer]  Tobacco Use   Smoking status: Never   Smokeless tobacco: Never  Vaping Use   Vaping Use: Never used  Substance and Sexual Activity   Alcohol use: No    Alcohol/week: 1.0 standard drink of alcohol    Types: 1 Standard drinks or equivalent per week    Comment: Social   Drug use: No   Sexual activity: Not on file  Other Topics Concern   Not on file  Social History Narrative   Patient lives at home with [name].   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         Social Determinants of Health   Financial Resource Strain: Medium Risk (06/01/2022)   Overall Financial Resource Strain (CARDIA)    Difficulty of Paying Living Expenses: Somewhat hard  Food Insecurity: No Food Insecurity (06/01/2022)   Hunger Vital Sign    Worried About Running Out of Food in the Last Year: Never true    Ran Out of Food in the Last Year: Never true  Transportation Needs: No Transportation Needs (06/01/2022)   PRAPARE - Hydrologist (Medical):  No    Lack of Transportation (Non-Medical): No  Physical Activity: Not on file  Stress: Not on file  Social Connections: Not on file  Intimate Partner Violence: Not on file      Marcial Pacas, M.D. Ph.D.  [facility] [address], [place], [place] [address] Ph: [phone] Fax: [phone]  CC:  Libby Maw, MD [place],  [place] [address]  Libby Maw, MD

## 2022-06-15 ENCOUNTER — Telehealth: Payer: Self-pay | Admitting: Licensed Clinical Social Worker

## 2022-06-15 ENCOUNTER — Telehealth: Payer: Self-pay | Admitting: *Deleted

## 2022-06-15 NOTE — Telephone Encounter (Signed)
   Telephone encounter was:  Successful.  06/15/2022 Name: Marie [name] MRN: [id number] DOB: [birth year]  [name] is a 67 y.o. year old female who is a primary care patient of Libby Maw, MD . The community resource team was consulted for assistance with Transportation Needs   Care guide performed the following interventions: Patient provided with information about care guide support team and interviewed to confirm resource needs. patient appts are not until April cannot book yet but she has the number to book with [facility] [phone] and she has also beeen sent the [place] transportation application to complete for future needs . I have explained it all to her again  Follow Up Plan:  No further follow up planned at this time. The patient has been provided with needed resources. [place] [phone] [address] , [place] [address] Email : [email or web address]

## 2022-06-15 NOTE — Telephone Encounter (Signed)
H&V Care Navigation CSW Progress Note  Clinical Social Worker contacted patient by phone to f/u again as a courtesy to ensure access to rides for upcoming appts. Pt shares she has a new appt to get to labs, was not sure how to get there. I confirmed she had received the resources I had previously sent. She isnt sure she has gotten [employer] app yet- I'll send another copy. I will reach out to [facility] to see if theyb can assist with ride to PCP labs appt. I remain available.   Patient is participating in a Managed Medicaid Plan:  No, Hum[employer] Medicare only.   SDOH Screenings   Food Insecurity: No Food Insecurity (06/01/2022)  Housing: Medium Risk (06/01/2022)  Transportation Needs: No Transportation Needs (06/01/2022)  Utilities: Not At Risk (06/01/2022)  Depression (PHQ2-9): Low Risk  (04/13/2022)  Financial Resource Strain: Medium Risk (06/01/2022)  Tobacco Use: Low Risk  (06/11/2022)   Westley Hummer, MSW, LCSW Clinical Social Worker [place]  [phone]- work cell phone (preferred) [phone]- desk phone

## 2022-06-17 ENCOUNTER — Other Ambulatory Visit: Payer: Medicare PPO

## 2022-06-23 ENCOUNTER — Telehealth: Payer: Self-pay | Admitting: Licensed Clinical Social Worker

## 2022-06-23 NOTE — Telephone Encounter (Signed)
H&V Care Navigation CSW Progress Note  Clinical Social Worker  received call from scheduling  to clarify where pt should send [place] [employer] application. Requested they provide pt with fax number to Dr. Judeth Cornfield at [place]. Pt should send full application and we can fax to [place] when provider portion completed. I remain available. Will route this to RN/LPN team.  Patient is participating in a Managed Medicaid Plan:  No, Humana Medicare only.   SDOH Screenings   Food Insecurity: No Food Insecurity (06/01/2022)  Housing: Medium Risk (06/01/2022)  Transportation Needs: No Transportation Needs (06/01/2022)  Utilities: Not At Risk (06/01/2022)  Depression (PHQ2-9): Low Risk  (04/13/2022)  Financial Resource Strain: Medium Risk (06/01/2022)  Tobacco Use: Low Risk  (06/11/2022)    Westley Hummer, MSW, LCSW Clinical Social Worker [place]  [phone]- work cell phone (preferred) [phone]- desk phone

## 2022-06-23 NOTE — Telephone Encounter (Signed)
Application received via fax, will have Dr. Harrell Gave fill out her portion and will fax back!

## 2022-06-29 NOTE — Telephone Encounter (Signed)
Documentation given to Dr. Harrell Gave to sign.

## 2022-07-02 ENCOUNTER — Ambulatory Visit: Payer: Self-pay

## 2022-07-02 NOTE — Patient Instructions (Signed)
Visit Information  Thank you for taking time to visit with me today. Please don't hesitate to contact me if I can be of assistance to you.   Following are the goals we discussed today:   Goals Addressed             This Visit's Progress    Diabetes Management       Patient Goals/Self Care Activities: -Patient/Caregiver will self-administer medications as prescribed as evidenced by self-report/primary caregiver report  -Patient/Caregiver will attend all scheduled provider appointments as evidenced by clinician review of documented attendance to scheduled appointments and patient/caregiver report -Patient/Caregiver will call provider office for new concerns or questions as evidenced by review of documented incoming telephone call notes and patient report  -check blood sugar at prescribed times -check blood sugar if I feel it is too high or too low -record values and write them down take them to all doctor visits  -limit carbohydrates and sweets   Interventions Today    Flowsheet Row Most Recent Value  Chronic Disease   Chronic disease during today's visit Diabetes  General Interventions   General Interventions Discussed/Reviewed General Interventions Reviewed  Doctor Visits Discussed/Reviewed Doctor Visits Discussed, PCP  Education Interventions   Education Provided Provided Education  Provided Verbal Education On Nutrition, Blood Sugar Monitoring  Nutrition Interventions   Nutrition Discussed/Reviewed Decreasing sugar intake, Nutrition Reviewed, Portion sizes  Pharmacy Interventions   Pharmacy Dicussed/Reviewed Pharmacy Topics Reviewed  Safety Interventions   Safety Discussed/Reviewed Safety Discussed  [driving safety]             Our next appointment is by telephone on 08/03/22 at 1030  Please call the care guide team at [phone] if you need to cancel or reschedule your appointment.   If you are experiencing a Mental Health or [place] or need  someone to talk to, please call the Suicide and Crisis Lifeline: 988 call the Suicide and Crisis Lifeline: 988  Patient verbalizes understanding of instructions and care plan provided today and agrees to view in [place]. Active MyChart status and patient understanding of how to access instructions and care plan via MyChart confirmed with patient.     The patient has been provided with contact information for the care management team and has been advised to call with any health related questions or concerns.   Jone Baseman, RN, MSN [place] Management Care Management Coordinator Direct Line [phone]

## 2022-07-02 NOTE — Patient Outreach (Signed)
  Care Coordination   Follow Up Visit Note   07/02/2022 Name: Marie [name] MRN: [id number] DOB: 01-25-1956  Marie [name] is a 67 y.o. year old female who sees Libby Maw, MD for primary care. I spoke with  [name] by phone today.  What matters to the patients health and wellness today?  Transportation and [employer].  Application presently with cardiology.     Goals Addressed             This Visit's Progress    Diabetes Management       Patient Goals/Self Care Activities: -Patient/Caregiver will self-administer medications as prescribed as evidenced by self-report/primary caregiver report  -Patient/Caregiver will attend all scheduled provider appointments as evidenced by clinician review of documented attendance to scheduled appointments and patient/caregiver report -Patient/Caregiver will call provider office for new concerns or questions as evidenced by review of documented incoming telephone call notes and patient report  -check blood sugar at prescribed times -check blood sugar if I feel it is too high or too low -record values and write them down take them to all doctor visits  -limit carbohydrates and sweets   Interventions Today    Flowsheet Row Most Recent Value  Chronic Disease   Chronic disease during today's visit Diabetes  General Interventions   General Interventions Discussed/Reviewed General Interventions Reviewed  Doctor Visits Discussed/Reviewed Doctor Visits Discussed, PCP  Education Interventions   Education Provided Provided Education  Provided Verbal Education On Nutrition, Blood Sugar Monitoring  Nutrition Interventions   Nutrition Discussed/Reviewed Decreasing sugar intake, Nutrition Reviewed, Portion sizes  Pharmacy Interventions   Pharmacy Dicussed/Reviewed Pharmacy Topics Reviewed  Safety Interventions   Safety Discussed/Reviewed Safety Discussed  [driving safety]             SDOH assessments and interventions  completed:  Yes     Care Coordination Interventions:  Yes, provided   Follow up plan: Follow up call scheduled for April    Encounter Outcome:  Pt. Visit Completed   Jone Baseman, RN, MSN [place] Management Care Management Coordinator Direct Line [phone]

## 2022-07-06 ENCOUNTER — Other Ambulatory Visit: Payer: Self-pay | Admitting: Family Medicine

## 2022-07-06 DIAGNOSIS — E7800 Pure hypercholesterolemia, unspecified: Secondary | ICD-10-CM

## 2022-07-07 ENCOUNTER — Telehealth: Payer: Self-pay

## 2022-07-07 ENCOUNTER — Telehealth: Payer: Self-pay | Admitting: Licensed Clinical Social Worker

## 2022-07-07 NOTE — Telephone Encounter (Signed)
H&V Care Navigation CSW Progress Note  Clinical Social Worker  received call from pt  to f/u with a question regarding her son's Medicaid. Pt son has Down syndrome and receives Medicare and Medicaid. Pt has called DSS multiple times and doesn't know what to do b/c she doesn't get calls back. Inquiring if I know of any resources that could tell her when/if she has to renew/certify for him. LCSW shared that there are caseworkers now Monday-Friday 8am-5pm at [facility]. She will use her iRide rides to go there with son and see if they can assist her further. No additional questions at this time- encouraged her to call me back if needed.   Patient is participating in a Managed Medicaid Plan:  No, Humana Medicare  [place]: No Food Insecurity (06/01/2022)  Housing: Medium Risk (06/01/2022)  Transportation Needs: No Transportation Needs (06/01/2022)  Utilities: Not At Risk (06/01/2022)  Depression (PHQ2-9): Low Risk  (04/13/2022)  Financial Resource Strain: Medium Risk (06/01/2022)  Tobacco Use: Low Risk  (06/11/2022)   Westley Hummer, MSW, LCSW Clinical Social Worker [place]  [phone]- work cell phone (preferred) [phone]- desk phone

## 2022-07-07 NOTE — Patient Outreach (Signed)
  Care Coordination   Follow Up Visit Note   07/07/2022 Name: Marie [name] MRN: [id number] DOB: 02-03-1956  Marie [name] is a 67 y.o. year old female who sees Libby Maw, MD for primary care. I spoke with  [name] by phone today after patient leaving a message. Patient doing well.  Patient has medicaid questions about son. Advised on Medicaid about questions of medicaid active.  What matters to the patients health and wellness today?  Health management    Goals Addressed             This Visit's Progress    Diabetes Management       Patient Goals/Self Care Activities: -Patient/Caregiver will self-administer medications as prescribed as evidenced by self-report/primary caregiver report  -Patient/Caregiver will attend all scheduled provider appointments as evidenced by clinician review of documented attendance to scheduled appointments and patient/caregiver report -Patient/Caregiver will call provider office for new concerns or questions as evidenced by review of documented incoming telephone call notes and patient report  -check blood sugar at prescribed times -check blood sugar if I feel it is too high or too low -record values and write them down take them to all doctor visits  -limit carbohydrates and sweets   Interventions Today    Flowsheet Row Most Recent Value  Chronic Disease   Chronic disease during today's visit Diabetes  General Interventions   General Interventions Discussed/Reviewed General Interventions Reviewed  Education Interventions   Provided Verbal Education On Nutrition, Blood Sugar Monitoring  Nutrition Interventions   Nutrition Discussed/Reviewed Decreasing sugar intake, Nutrition Reviewed, Carbohydrate meal planning, Portion sizes, Adding fruits and vegetables             SDOH assessments and interventions completed:  Yes     Care Coordination Interventions:  Yes, provided   Follow up plan: Follow up call scheduled for later  in April    Encounter Outcome:  Pt. Visit Completed   Jone Baseman, RN, MSN [facility] Care Management Coordinator Direct Line [phone]

## 2022-07-07 NOTE — Patient Instructions (Signed)
Visit Information  Thank you for taking time to visit with me today. Please don't hesitate to contact me if I can be of assistance to you.   Following are the goals we discussed today:   Goals Addressed             This Visit's Progress    Diabetes Management       Patient Goals/Self Care Activities: -Patient/Caregiver will self-administer medications as prescribed as evidenced by self-report/primary caregiver report  -Patient/Caregiver will attend all scheduled provider appointments as evidenced by clinician review of documented attendance to scheduled appointments and patient/caregiver report -Patient/Caregiver will call provider office for new concerns or questions as evidenced by review of documented incoming telephone call notes and patient report  -check blood sugar at prescribed times -check blood sugar if I feel it is too high or too low -record values and write them down take them to all doctor visits  -limit carbohydrates and sweets   Interventions Today    Flowsheet Row Most Recent Value  Chronic Disease   Chronic disease during today's visit Diabetes  General Interventions   General Interventions Discussed/Reviewed General Interventions Reviewed  Education Interventions   Provided Verbal Education On Nutrition, Blood Sugar Monitoring  Nutrition Interventions   Nutrition Discussed/Reviewed Decreasing sugar intake, Nutrition Reviewed, Carbohydrate meal planning, Portion sizes, Adding fruits and vegetables             Our next appointment is by telephone on 08/03/22  Please call the care guide team at [phone] if you need to cancel or reschedule your appointment.   If you are experiencing a Mental Health or [place] or need someone to talk to, please call the Suicide and Crisis Lifeline: 988   Patient verbalizes understanding of instructions and care plan provided today and agrees to view in [place]. Active MyChart status and patient  understanding of how to access instructions and care plan via MyChart confirmed with patient.     The patient has been provided with contact information for the care management team and has been advised to call with any health related questions or concerns.   Jone Baseman, RN, MSN [facility] Care Management Coordinator Direct Line [phone]

## 2022-07-23 ENCOUNTER — Telehealth: Payer: Self-pay | Admitting: Family Medicine

## 2022-07-23 NOTE — Telephone Encounter (Signed)
PAPERWORK/FORMS received  Dropped off by: pt Call back #: [phone]  Individual made aware of 3-5 business day turn around (YES/NO): Y GREEN charge sheet completed and patient made aware of possible charge (YES/NO): Y Placed in provider folder at front desk. ~~~ route to CMA/provider Team  CLINICAL USE BELOW THIS LINE (use X to signify action taken)  ___ Form received and placed in providers office for signature. ___ Form completed and faxed to LOA Dept.  ___ Form completed & LVM to notify patient ready for pick up.  ___ Charge sheet and copy of form in front office folder for office supervisor.

## 2022-07-23 NOTE — Telephone Encounter (Signed)
Email faxed back to [facility], confirmation received.

## 2022-07-24 NOTE — Telephone Encounter (Signed)
H&V Care Navigation CSW Progress Note  Clinical Social Worker  mailed pt application completed by MD  to [employer] Eligibility Department. Pt will be mailed any additional f/u from that department to schedule interview etc. I remain available if needed.  Patient is participating in a Managed Medicaid Plan:  Yes, Hum[employer] Medicare only  SDOH Screenings   Food Insecurity: No Food Insecurity (06/01/2022)  Housing: Medium Risk (06/01/2022)  Transportation Needs: No Transportation Needs (06/01/2022)  Utilities: Not At Risk (06/01/2022)  Depression (PHQ2-9): Low Risk  (04/13/2022)  Financial Resource Strain: Medium Risk (06/01/2022)  Tobacco Use: Low Risk  (06/11/2022)   Octavio Graves, MSW, LCSW Clinical Social Worker II [facility] Health Heart/Vascular Care Navigation  [phone]- work cell phone (preferred) [phone]- desk phone

## 2022-07-29 NOTE — Telephone Encounter (Signed)
..   CLINICAL USE BELOW THIS LINE (use X to signify taken)  ____Form received and placed in providers office for signature. _X___Form completed and faxed patient aware . ____Form completed & LVM to notify pt ready for pick up __X__Charge sheet & copy of form in front office folder for office supervisor.

## 2022-07-29 NOTE — Telephone Encounter (Signed)
CLINICAL USE BELOW THIS LINE (use X to signify taken)  ____Form received and placed in providers office for signature. __X__Form completed and faxed patient aware. ____Form completed & LVM to notify pt ready for pick up ____Charge sheet & copy of form in front office folder for office supervisor.

## 2022-07-30 ENCOUNTER — Telehealth: Payer: Self-pay | Admitting: Family Medicine

## 2022-07-30 NOTE — Telephone Encounter (Signed)
Spoke with patient who states that she think she needs for Dr. Doreene Burke to fill out dental form for all of the specialist that she sees. Patient verbally understood she may need to send same form to specialist that she sent for dr. Doreene Burke to fill out since they have also placed patient on medications. Patient states that she will check on this and call back.

## 2022-07-30 NOTE — Telephone Encounter (Signed)
Please Call  the pt ba back in ref. You sent to her dentist

## 2022-08-03 ENCOUNTER — Telehealth: Payer: Self-pay | Admitting: Licensed Clinical Social Worker

## 2022-08-03 ENCOUNTER — Ambulatory Visit: Payer: Self-pay

## 2022-08-03 NOTE — Patient Outreach (Signed)
  Care Coordination   Follow Up Visit Note   08/03/2022 Name: CRISTI GWYNN MRN: [id number] DOB: 1955/07/04  DETRA BORES is a 67 y.o. year old female who sees Mliss Sax, MD for primary care. I spoke with  [name] by phone today.  What matters to the patients health and wellness today?  Diabetes management    Goals Addressed             This Visit's Progress    Diabetes Management       Patient Goals/Self Care Activities: -Patient/Caregiver will self-administer medications as prescribed as evidenced by self-report/primary caregiver report  -Patient/Caregiver will attend all scheduled provider appointments as evidenced by clinician review of documented attendance to scheduled appointments and patient/caregiver report -Patient/Caregiver will call provider office for new concerns or questions as evidenced by review of documented incoming telephone call notes and patient report  -check blood sugar at prescribed times -check blood sugar if I feel it is too high or too low -record values and write them down take them to all doctor visits  -limit carbohydrates and sweets  Patient blood sugars up and down.  Lows 48 and highs in 250 range. Discussed diabetes management.  Interventions Today    Flowsheet Row Most Recent Value  Chronic Disease   Chronic disease during today's visit Diabetes  General Interventions   General Interventions Discussed/Reviewed [employer], General Interventions Reviewed  Doctor Visits Discussed/Reviewed Doctor Visits Reviewed  Exercise Interventions   Exercise Discussed/Reviewed Exercise Reviewed  Education Interventions   Education Provided Provided Education  Provided Verbal Education On Nutrition, Blood Sugar Monitoring  [Diabetes Management.  Patient pending transportation for [employer] [place].  Patient to follow up with [facility] on status.]  Nutrition Interventions   Nutrition Discussed/Reviewed Nutrition Discussed, Decreasing  sugar intake, Portion sizes             SDOH assessments and interventions completed:  Yes     Care Coordination Interventions:  Yes, provided   Follow up plan: Follow up call scheduled for May    Encounter Outcome:  Pt. Visit Completed   Bary Leriche, RN, MSN [facility] Care Management Care Management Coordinator Direct Line [phone]

## 2022-08-03 NOTE — Telephone Encounter (Signed)
H&V Care Navigation CSW Progress Note  Clinical Social Worker contacted patient by phone to f/u on [facility]. Pt states she has not received any updates yet. I shared they will mail her any needed items or updates. Application was mailed 07/24/22. We discussed pt son re-certification again, she still hasn't heard back from DSS and remains limited with getting to office to ask in person. I emailed DSS caseworker Cathlyn Parsons, who attempted to call pt. Pt voicemail not set up so she couldn't leave her a message.   She provided me pt son's caseworker [employer] and her number which I called pt back and provided her verbally, she wrote it down. I remain available if needed.   Patient is participating in a Managed Medicaid Plan:  No, Humana Medicare  SDOH Screenings   Food Insecurity: No Food Insecurity (06/01/2022)  Housing: Medium Risk (06/01/2022)  Transportation Needs: No Transportation Needs (06/01/2022)  Utilities: Not At Risk (06/01/2022)  Depression (PHQ2-9): Low Risk  (04/13/2022)  Financial Resource Strain: Medium Risk (06/01/2022)  Tobacco Use: Low Risk  (06/11/2022)    Octavio Graves, MSW, LCSW Clinical Social Worker II [facility] Health Heart/Vascular Care Navigation  [phone]- work cell phone (preferred) [phone]- desk phone

## 2022-08-03 NOTE — Patient Instructions (Signed)
Visit Information  Thank you for taking time to visit with me today. Please don't hesitate to contact me if I can be of assistance to you.   Following are the goals we discussed today:   Goals Addressed             This Visit's Progress    Diabetes Management       Patient Goals/Self Care Activities: -Patient/Caregiver will self-administer medications as prescribed as evidenced by self-report/primary caregiver report  -Patient/Caregiver will attend all scheduled provider appointments as evidenced by clinician review of documented attendance to scheduled appointments and patient/caregiver report -Patient/Caregiver will call provider office for new concerns or questions as evidenced by review of documented incoming telephone call notes and patient report  -check blood sugar at prescribed times -check blood sugar if I feel it is too high or too low -record values and write them down take them to all doctor visits  -limit carbohydrates and sweets  Patient blood sugars up and down.  Lows 48 and highs in 250 range. Discussed diabetes management.  Interventions Today    Flowsheet Row Most Recent Value  Chronic Disease   Chronic disease during today's visit Diabetes  General Interventions   General Interventions Discussed/Reviewed [facility], General Interventions Reviewed  Doctor Visits Discussed/Reviewed Doctor Visits Reviewed  Exercise Interventions   Exercise Discussed/Reviewed Exercise Reviewed  Education Interventions   Education Provided Provided Education  Provided Verbal Education On Nutrition, Blood Sugar Monitoring  [Diabetes Management.  Patient pending transportation for [employer] [place].  Patient to follow up with [employer] [facility] on status.]  Nutrition Interventions   Nutrition Discussed/Reviewed Nutrition Discussed, Decreasing sugar intake, Portion sizes             Our next appointment is by telephone on Sep 01, 2022 at 1000 am  Please call the care guide team at  [phone] if you need to cancel or reschedule your appointment.   If you are experiencing a Mental Health or Behavioral Health Crisis or need someone to talk to, please call the Suicide and Crisis Lifeline: 988   Patient verbalizes understanding of instructions and care plan provided today and agrees to view in MyChart. Active MyChart status and patient understanding of how to access instructions and care plan via MyChart confirmed with patient.     The patient has been provided with contact information for the care management team and has been advised to call with any health related questions or concerns.   Bary Leriche, RN, MSN [facility] Care Management Care Management Coordinator Direct Line [phone]

## 2022-08-04 ENCOUNTER — Telehealth: Payer: Self-pay | Admitting: *Deleted

## 2022-08-04 NOTE — Telephone Encounter (Signed)
H&V Care Navigation CSW Progress Note  Clinical Social Worker  received a call from pt . I reviewed again information from DSS caseworker and how to contact [facility] for non heart related appt transportation. [facility] app has been mailed, pt will be contacted for more information or determination. She was given eligibility department contact information.   Patient is participating in a Managed Medicaid Plan:  No, Humana Medicare  SDOH Screenings   Food Insecurity: No Food Insecurity (06/01/2022)  Housing: Medium Risk (06/01/2022)  Transportation Needs: No Transportation Needs (06/01/2022)  Utilities: Not At Risk (06/01/2022)  Depression (PHQ2-9): Low Risk  (04/13/2022)  Financial Resource Strain: Medium Risk (06/01/2022)  Tobacco Use: Low Risk  (06/11/2022)   Octavio Graves, MSW, LCSW Clinical Social Worker II [facility] Health Heart/Vascular Care Navigation  [phone]- work cell phone (preferred) [phone]- desk phone

## 2022-08-04 NOTE — Telephone Encounter (Signed)
   Marie [name] DOB: [birth year] MRN: [id number]   RIDER WAIVER AND RELEASE OF LIABILITY  For purposes of improving physical access to our facilities, [facility] is pleased to partner with third parties to provide [facility] patients or other authorized individuals the option of convenient, on-demand ground transportation services (the [employer]") through use of the technology service that enables users to request on-demand ground transportation from independent third-party providers.  By opting to use and accept these [employer], I, the undersigned, hereby agree on behalf of myself, and on behalf of any minor child using the Science writer for whom I am the parent or legal guardian, as follows:  Science writer provided to me are provided by independent third-party transportation providers who are not [employer] or employees and who are unaffiliated with [employer]. [place] is neither a transportation carrier nor a common or public carrier. [facility] has no control over the quality or safety of the transportation that occurs as a result of the [employer]. [facility] cannot guarantee that any third-party transportation provider will complete any arranged transportation service. [facility] makes no representation, warranty, or guarantee regarding the reliability, timeliness, quality, safety, suitability, or availability of any of the Transport Services or that they will be error free. I fully understand that traveling by vehicle involves risks and dangers of serious bodily injury, including permanent disability, paralysis, and death. I agree, on behalf of myself and on behalf of any minor child using the Transport Services for whom I am the parent or legal guardian, that the entire risk arising out of my use of the [employer] remains solely with me, to the maximum extent permitted under applicable law. The [employer] are provided "as  is" and "as available." [facility] disclaims all representations and warranties, express, implied or statutory, not expressly set out in these terms, including the implied warranties of merchantability and fitness for a particular purpose. I hereby waive and release [facility], its agents, employees, officers, directors, representatives, insurers, attorneys, assigns, successors, subsidiaries, and affiliates from any and all past, present, or future claims, demands, liabilities, actions, causes of action, or suits of any kind directly or indirectly arising from acceptance and use of the [employer]. I further waive and release [facility] and its affiliates from all present and future liability and responsibility for any injury or death to persons or damages to property caused by or related to the use of the [employer]. I have read this Waiver and Release of Liability, and I understand the terms used in it and their legal significance. This Waiver is freely and voluntarily given with the understanding that my right (as well as the right of any minor child for whom I am the parent or legal guardian using the [employer]) to legal recourse against  in connection with the [employer] is knowingly surrendered in return for use of these services.   I attest that I read the consent document to [name], gave Ms. Steadman the opportunity to ask questions and answered the questions asked (if any). I affirm that [name] then provided consent for she's participation in this program.     Marie [name]

## 2022-08-04 NOTE — Telephone Encounter (Signed)
Pt is stating the dental office never got this fax. She is wanting a cb concerning this issue. She also would like a copy of this sedation clearance form. Pt at [phone]

## 2022-08-10 ENCOUNTER — Ambulatory Visit (HOSPITAL_BASED_OUTPATIENT_CLINIC_OR_DEPARTMENT_OTHER): Payer: Medicare HMO | Admitting: Cardiology

## 2022-08-12 ENCOUNTER — Encounter: Payer: Medicare PPO | Admitting: Neurology

## 2022-08-12 ENCOUNTER — Telehealth: Payer: Self-pay

## 2022-08-12 ENCOUNTER — Ambulatory Visit: Payer: Medicare PPO | Admitting: Family Medicine

## 2022-08-12 NOTE — Telephone Encounter (Signed)
Telephone encounter was:  Successful.  08/12/2022 Name: Marie [name] MRN: [id number] DOB: 04-06-1956  Marie [name] is a 67 y.o. year old female who is a primary care patient of Mliss Sax, MD . The community resource team was consulted for assistance with Transportation Needs   Care guide performed the following interventions: Spoke with patient she canceled 08/12/22 appointment at [facility] and rescheduled for 09/30/22 at 7:30 am. Sending request to [employer] and will call the patient once I receive confirmation. Patient is aware that she needs to confirm her ride when she receives the text from [place].  Follow Up Plan:   I will contact the patient once I receive confirmation from [place].  08/12/2022  Marie [name] DOB: 1955/11/28 MRN: [id number]   RIDER WAIVER AND RELEASE OF LIABILITY  For the purposes of helping with transportation needs, [facility] partners with outside transportation providers (taxi companies, [place], Catering manager.) to give [employer] patients or other approved people the choice of on-demand rides [employer]") to our buildings for non-emergency visits.  By using [employer], I, the person signing this document, on behalf of myself and/or any legal minors (in my care using the [employer]), agree:  Science writer given to me are supplied by independent, outside transportation providers who do not work for, or have any affiliation with, [employer]. [place] is not a transportation company. [facility] has no control over the quality or safety of the rides I get using [employer]. [place] has no control over whether any outside ride will happen on time or not. [place] gives no guarantee on the reliability, quality, safety, or availability on any rides, or that no mistakes will happen. I know and accept that traveling by vehicle (car, truck, SVU, Zenaida Niece, bus, taxi, etc.) has risks of serious injuries such as  disability, being paralyzed, and death. I know and agree the risk of using [employer] is mine alone, and not [employer]. Transport Services are provided "as is" and as are available. The transportation providers are in charge for all inspections and care of the vehicles used to provide these rides. I agree not to take legal action against [place], its agents, employees, officers, directors, representatives, insurers, attorneys, assigns, successors, subsidiaries, and affiliates at any time for any reasons related directly or indirectly to using [employer]. I also agree not to take legal action against [place] or its affiliates for any injury, death, or damage to property caused by or related to using [employer]. I have read this Waiver and Release of Liability, and I understand the terms used in it and their legal meaning. This Waiver is freely and voluntarily given with the understanding that my right (or any legal minors) to legal action against [place] relating to [employer] is knowingly given up to use these services.   I attest that I read the Ride Waiver and Release of Liability to Marie [name], gave Ms. Schlichte the opportunity to ask questions and answered the questions asked (if any). I affirm that Marie [name] then provided consent for assistance with transportation.     Kayson Tasker D Kynley Metzger Durenda Pechacek   [facility] Population Health Community Resource Care Guide   [email or web address]  ?? [phone]   Website:  [email or web address]  [email or web address]

## 2022-08-12 NOTE — Telephone Encounter (Signed)
   Telephone encounter was:  Successful.  08/12/2022 Name: Marie [name] MRN: [id number] DOB: 1955-05-03  Marie [name] is a 67 y.o. year old female who is a primary care patient of Mliss Sax, MD . The community resource team was consulted for assistance with Transportation Needs   Care guide performed the following interventions: Spoke with patient to inform her of pickup time for 09/30/22 6:55 am.  Follow Up Plan:  No further follow up planned at this time. The patient has been provided with needed resources.  [facility] Support Team Ride Details: Roundtrip A-Leg Ride ID: [id number] Passenger Name: Marie [name] Passenger Phone: [phone] Ride Date: 09/30/2022 Daryll Drown Time: 06:55 AM (EDT) Pickup Address: [address] [place], [place] [address] Drop-off Address: [address], [place], [place], [place] Transportation Type: Standard Vehicle: Door-to-Door Investment banker, corporate: Patient's son will be riding with her Drop off: [facility], [address], [place] [address], [phone] B-Leg Ride ID: [id number] Daryll Drown Time: 11:59 PM Pickup Address: [address] [place], [place], [place] Drop-off Address: [address] [place], [place] [address] Transportation Type: Standard Vehicle: Door-to-Door  Leisure centre manager Health  [facility] Population Health [employer] Care Guide   [email or web address]  ?? [phone]   Website: [email or web address]  [email or web address]

## 2022-08-13 ENCOUNTER — Ambulatory Visit: Payer: Medicare PPO | Admitting: Family Medicine

## 2022-08-17 ENCOUNTER — Other Ambulatory Visit (INDEPENDENT_AMBULATORY_CARE_PROVIDER_SITE_OTHER): Payer: Medicare PPO

## 2022-08-17 DIAGNOSIS — E1165 Type 2 diabetes mellitus with hyperglycemia: Secondary | ICD-10-CM | POA: Diagnosis not present

## 2022-08-17 LAB — BASIC METABOLIC PANEL
BUN: 13 mg/dL (ref 6–23)
CO2: 28 mEq/L (ref 19–32)
Calcium: 9.4 mg/dL (ref 8.4–10.5)
Chloride: 102 mEq/L (ref 96–112)
Creatinine, Ser: 0.84 mg/dL (ref 0.40–1.20)
GFR: 71.5 mL/min (ref >60.00)
Glucose, Bld: 101 mg/dL — ABNORMAL HIGH (ref 70–99)
Potassium: 4.8 mEq/L (ref 3.5–5.1)
Sodium: 141 mEq/L (ref 135–145)

## 2022-08-17 NOTE — Telephone Encounter (Signed)
Per patient dentist have received forms as of today 08/17/22.

## 2022-08-18 ENCOUNTER — Telehealth: Payer: Self-pay | Admitting: *Deleted

## 2022-08-18 LAB — FRUCTOSAMINE: Fructosamine: 246 umol/L (ref 0–285)

## 2022-08-18 NOTE — Telephone Encounter (Signed)
   Telephone encounter was:  Successful.  08/18/2022 Name: Marie [name] MRN: [id number] DOB: [birth year]  Marie [name] is a 67 y.o. year old female who is a primary care patient of Mliss Sax, MD . The community resource team was consulted for assistance with Transportation Needs  Patient called to verify ride  Care guide performed the following interventions: Patient provided with information about care guide support team and interviewed to confirm resource needs.  Follow Up Plan:  No further follow up planned at this time. The patient has been provided with needed resources. Marie Mao Greenauer -[facility] [place], Population Health [phone] [address] [place] , [place] [address] Email : [name]

## 2022-08-19 ENCOUNTER — Telehealth: Payer: Self-pay | Admitting: *Deleted

## 2022-08-19 NOTE — Telephone Encounter (Signed)
   Telephone encounter was:  Successful.  08/19/2022 Name: DANEL SETTY MRN: [id number] DOB: 10-Nov-1955  [name] is a 67 y.o. year old female who is a primary care patient of Mliss Sax, MD . The community resource team was consulted for assistance with Transportation Needs   Care guide performed the following interventions: Patient provided with information about care guide support team and interviewed to confirm resource needs. Booked transportation  Follow Up Plan:  No further follow up planned at this time. The patient has been provided with needed resources.  Yehuda Mao Greenauer -[facility] [place], Population Health [phone] [address] [place] , [place] [address] Email : [email or web address]

## 2022-08-20 ENCOUNTER — Ambulatory Visit: Payer: Medicare PPO | Admitting: Endocrinology

## 2022-08-20 ENCOUNTER — Encounter: Payer: Self-pay | Admitting: Endocrinology

## 2022-08-20 VITALS — BP 128/72 | HR 96 | Resp 16 | Ht 61.0 in | Wt 134.6 lb

## 2022-08-20 DIAGNOSIS — Z7984 Long term (current) use of oral hypoglycemic drugs: Secondary | ICD-10-CM

## 2022-08-20 DIAGNOSIS — E1165 Type 2 diabetes mellitus with hyperglycemia: Secondary | ICD-10-CM

## 2022-08-20 DIAGNOSIS — E782 Mixed hyperlipidemia: Secondary | ICD-10-CM

## 2022-08-20 LAB — POCT GLYCOSYLATED HEMOGLOBIN (HGB A1C): Hemoglobin A1C: 7 % — AB (ref 4.0–5.6)

## 2022-08-20 NOTE — Patient Instructions (Addendum)
Take both Metformin after breakfast  [facility]  Farxiga in am  Add peanut butter to toast

## 2022-08-20 NOTE — Progress Notes (Signed)
Patient ID: Marie [name], female   DOB: 1955-11-01, 67 y.o.   MRN: [id number]           Reason for Appointment: Follow-up for Type 2 Diabetes   History of Present Illness:          Date of diagnosis of type 2 diabetes mellitus: 2010       Background history:   She was likely treated with Metformin alone for the first few years and subsequently given Januvia In 2018 she was also given [place] in addition Current records indicate her highest A1c was 9.6 in 03/2017.  Has been progressively improving since then She was also started on Rybelsus in 02/2019  Recent history:   A1c is 7, was 7.4 Fructosamine 246  Non-insulin hypoglycemic drugs the patient is taking are: Metformin 1.0 g daily, also Farxiga 5 mg at night, Prandin 4-2-4 mg before meals    Current management, blood sugar patterns and problems identified  She was able to start back on Farxiga since her last visit However she is taking this at night and she felt that it was dropping her blood sugars overnight to as low as 50s at times She has not taken this for at least 10 days now Also currently taking metformin ER 500 mg at breakfast and bedtime Blood sugars over the last month show that she still has postprandial hyperglycemia mostly after breakfast Overall blood sugars after meals are higher since she stopped [place] She is trying to do more walking lately up to 45 minutes a day and has not gained any more weight        Side effects from medications have been: Decreased appetite from Rybelsus   Typical meal intake: Breakfast is egg, toast 1 slice.              Glucose monitoring:  Using freestyle libre 3 currently  Data for the last 2 weeks  Interpretation of the [facility] 3 download for the last 14 days as follows  OVERNIGHT blood sugars are averaging about 170 around midnight and then progressively decreasing until about 4 AM through 7 AM before rising again No hypoglycemia overnight Premeal blood sugars are  mildly increased at dinnertime  Postprandial readings are the highest after breakfast with significant variability and mostly higher in the last week or so Blood sugars are also variably high after dinner with a few readings going over 180 Sometimes blood sugars are still high late in the evening going into the early part of the night No hypoglycemia during the day  CGM use % of time   2-week average/GV 150  Time in range 69%  % Time Above 180 26  % Time above 250 5  % Time Below 70      PRE-MEAL Fasting Lunch Dinner Bedtime Overall  Glucose range:       Averages: 123  140     POST-MEAL PC Breakfast PC Lunch PC Dinner  Glucose range:     Averages: 204 155 179    Previously:  CGM use % of time   2-week average/GV 148  Time in range      72%  % Time Above 180 24+3  % Time above 250   % Time Below 70      PRE-MEAL Fasting Lunch Dinner Bedtime Overall  Glucose range:       Averages: 114       POST-MEAL PC Breakfast PC Lunch PC Dinner  Glucose range:  Averages: 186 163 159    Dietician visit, most recent: 11/14  Weight history:   Wt Readings from Last 3 Encounters:  08/20/22 134 lb 9.6 oz (61.1 kg)  06/11/22 135 lb (61.2 kg)  05/29/22 135 lb (61.2 kg)    Glycemic control:   Lab Results  Component Value Date   HGBA1C 7.4 (H) 05/20/2022   HGBA1C 6.9 (H) 01/16/2022   HGBA1C 7.2 (H) 10/17/2021   Lab Results  Component Value Date   MICROALBUR 0.8 05/20/2022   LDLCALC 58 07/18/2021   CREATININE 0.84 08/17/2022   Lab Results  Component Value Date   MICRALBCREAT 1.9 05/20/2022    Lab Results  Component Value Date   FRUCTOSAMINE 246 08/17/2022   FRUCTOSAMINE 262 02/14/2021   FRUCTOSAMINE 260 10/25/2020    Lab on 08/17/2022  Component Date Value Ref Range Status   Fructosamine 08/17/2022 246  0 - 285 umol/L Final   Comment: Published reference interval for apparently healthy subjects between age 30 and 32 is 70 - 285 umol/L and in a  poorly controlled diabetic population is 228 - 563 umol/L with a mean of 396 umol/L.    Sodium 08/17/2022 141  135 - 145 mEq/L Final   Potassium 08/17/2022 4.8  3.5 - 5.1 mEq/L Final   Chloride 08/17/2022 102  96 - 112 mEq/L Final   CO2 08/17/2022 28  19 - 32 mEq/L Final   Glucose, Bld 08/17/2022 101 (H)  70 - 99 mg/dL Final   BUN 13/11/6576 13  6 - 23 mg/dL Final   Creatinine, Ser 08/17/2022 0.84  0.40 - 1.20 mg/dL Final   GFR 46/96/2952 71.50  >60.00 mL/min Final   Calculated using the CKD-EPI Creatinine Equation (2021)   Calcium 08/17/2022 9.4  8.4 - 10.5 mg/dL Final    Allergies as of 08/20/2022       Reactions   Penicillins Other (See Comments)   Driving couldn't see and head felt heavy  Has patient had a PCN reaction causing immediate rash, facial/tongue/throat swelling, SOB or lightheadedness with hypotension: No Has patient had a PCN reaction causing severe rash involving mucus membranes or skin necrosis: No Has patient had a PCN reaction that required hospitalization: No Has patient had a PCN reaction occurring within the last 10 years: No If all of the above answers are "NO", then may proceed with Cephalosporin use.        Medication List        Accurate as of Aug 20, 2022 10:19 AM. If you have any questions, ask your nurse or doctor.          ascorbic acid 500 MG tablet Commonly known as: VITAMIN C Take 500 mg by mouth daily.   atorvastatin 80 MG tablet Commonly known as: LIPITOR TAKE 1 TABLET EVERY DAY   dapagliflozin propanediol 10 MG Tabs tablet Commonly known as: Farxiga Take 1 tablet (10 mg total) by mouth daily.   escitalopram 10 MG tablet Commonly known as: LEXAPRO TAKE 1 TABLET(10 MG) BY MOUTH DAILY   FreeStyle Libre 3 Sensor Misc Place 1 sensor on the skin every 14 days. Use to check glucose continuously   metFORMIN 500 MG 24 hr tablet Commonly known as: GLUCOPHAGE-XR TAKE 1 TABLETS IN THE MORNING AND AT BEDTIME What changed:  how much  to take when to take this   multivitamin with minerals Tabs tablet Take 1 tablet by mouth daily.   repaglinide 2 MG tablet Commonly known as: PRANDIN TAKE 2 TABLETS BEFORE  BREAKFAST, TAKE 1 TABLET AT LUNCH, AND TAKE 1 TABLET BEFORE DINNER   traZODone 50 MG tablet Commonly known as: DESYREL Take 1 tablet (50 mg total) by mouth at bedtime.   valsartan 80 MG tablet Commonly known as: Diovan Take 1 tablet (80 mg total) by mouth daily.   Vitamin D3 75 MCG (3000 UT) Tabs Take 3,000 Units by mouth at bedtime.        Allergies:  Allergies  Allergen Reactions   Penicillins Other (See Comments)    Driving couldn't see and head felt heavy  Has patient had a PCN reaction causing immediate rash, facial/tongue/throat swelling, SOB or lightheadedness with hypotension: No Has patient had a PCN reaction causing severe rash involving mucus membranes or skin necrosis: No Has patient had a PCN reaction that required hospitalization: No Has patient had a PCN reaction occurring within the last 10 years: No If all of the above answers are "NO", then may proceed with Cephalosporin use.     Past Medical History:  Diagnosis Date   CVA (cerebral vascular accident) (HCC) 2015   Depression    Diabetes (HCC)    t2   Gallstones    Hyperlipemia    Hypertension    Migraines     Past Surgical History:  Procedure Laterality Date   BREAST BIOPSY Right 05/2019   CESAREAN SECTION     x2   CHOLECYSTECTOMY N/A 05/25/2016   Procedure: LAPAROSCOPIC CHOLECYSTECTOMY WITH INTRAOPERATIVE CHOLANGIOGRAM;  Surgeon: Luretha Murphy, MD;  Location: [facility] ORS;  Service: General;  Laterality: N/A;   IR GENERIC HISTORICAL  03/13/2016   IR PERC CHOLECYSTOSTOMY 03/13/2016 [name], MD [facility]INTER[facility] RAD   IR GENERIC HISTORICAL  04/28/2016   IR RADIOLOGIST EVAL & MGMT 04/28/2016 [name], MD [facility] INTER[facility] RAD   LOOP RECORDER IMPLANT N/A 11/29/2013   Procedure: LOOP RECORDER IMPLANT;  Surgeon: Marinus Maw, MD;   Location: [facility] CATH LAB;  Service: Cardiovascular;  Laterality: N/A;   TEE WITHOUT CARDIOVERSION N/A 08/24/2013   Procedure: TRANSESOPHAGEAL ECHOCARDIOGRAM (TEE);  Surgeon: [name], MD;  Location: [facility] ENDOSCOPY;  Service: Cardiovascular;  Laterality: N/A;    Family History  Problem Relation Age of Onset   Stomach cancer Mother    High blood pressure Mother    Diabetes Mother    Diabetes Maternal Grandfather    High blood pressure Maternal Grandfather    Heart disease Neg Hx     Social History:  reports that she has never smoked. She has never used smokeless tobacco. She reports that she does not drink alcohol and does not use drugs.   Review of Systems    Lipid history: LDL controlled with atorvastatin 80 mg. Followed by PCP   Lab Results  Component Value Date   CHOL 128 07/18/2021   HDL 48.30 07/18/2021   LDLCALC 58 07/18/2021   LDLDIRECT 136.0 09/24/2020   TRIG 105.0 07/18/2021   CHOLHDL 3 07/18/2021           Hypertension: Has been present and treated with lisinopril 40 mg  Followed by PCP  BP Readings from Last 3 Encounters:  08/20/22 128/72  06/11/22 (!) 151/79  05/29/22 134/81    Most recent eye exam was in 8/23 with Dr. Luciana Axe.  Has done proliferative retinopathy  Most recent foot exam: 3/22  Currently known complications of diabetes: Retinopathy with decreased vision on the right, regularly followed by retinal surgery  Normal microalbumin in 9/22  History of CVA with left facial numbness and speech  difficulty  LABS:  Lab on 08/17/2022  Component Date Value Ref Range Status   Fructosamine 08/17/2022 246  0 - 285 umol/L Final   Comment: Published reference interval for apparently healthy subjects between age 61 and 59 is 97 - 285 umol/L and in a poorly controlled diabetic population is 228 - 563 umol/L with a mean of 396 umol/L.    Sodium 08/17/2022 141  135 - 145 mEq/L Final   Potassium 08/17/2022 4.8  3.5 - 5.1 mEq/L Final   Chloride  08/17/2022 102  96 - 112 mEq/L Final   CO2 08/17/2022 28  19 - 32 mEq/L Final   Glucose, Bld 08/17/2022 101 (H)  70 - 99 mg/dL Final   BUN 16/01/9603 13  6 - 23 mg/dL Final   Creatinine, Ser 08/17/2022 0.84  0.40 - 1.20 mg/dL Final   GFR 54/12/8117 71.50  >60.00 mL/min Final   Calculated using the CKD-EPI Creatinine Equation (2021)   Calcium 08/17/2022 9.4  8.4 - 10.5 mg/dL Final    Physical Examination:  BP 128/72   Pulse 96   Resp 16   Ht 5\' 1"  (1.549 m)   Wt 134 lb 9.6 oz (61.1 kg)   SpO2 98%   BMI 25.43 kg/m    Diabetic Foot Exam - Simple   Simple Foot Form Diabetic Foot exam was performed with the following findings: Yes   Visual Inspection No deformities, no ulcerations, no other skin breakdown bilaterally: Yes See comments: Yes Sensation Testing Intact to touch and monofilament testing bilaterally: Yes Pulse Check Posterior Tibialis and Dorsalis pulse intact bilaterally: Yes Comments Calluses on Right foot        ASSESSMENT:  Diabetes type 2 nonobese  See history of present illness for detailed discussion of current diabetes management, blood sugar patterns and problems identified  A1c is 7 compared to 7.4 Also fructosamine fairly good at 246  Current treatment regimen is Metformin 1000 mg, Farxiga 5 and Prandin 2-4 mg, 3 times daily  She is fairly consistent with her diet Also doing well with exercise He is able to afford the [place] which is not covered by her insurance Her time in target on her CGM is 75% However some of her higher sugars may be related recently to not taking [place] As before highest readings are after breakfast  Microalbumin normal  Foot exam is normal  PLAN:    Start back on Farxiga 5 mg in the morning daily May consider increasing the dose if needed but this may not be affordable for her She will try taking both tablets of extended release metformin at breakfast time instead of 1 morning and 1 at night Continue regular  exercise with walking Try to add more protein such as peanut butter at breakfast If her A1c continues to be improved likely will not need adding mealtime insulin   There are no Patient Instructions on file for this visit.      Reather Littler 08/20/2022, 10:19 AM   Note: This office note was prepared with Dragon voice recognition system technology. Any transcriptional errors that result from this process are unintentional.

## 2022-08-21 ENCOUNTER — Encounter: Payer: Self-pay | Admitting: Endocrinology

## 2022-09-01 ENCOUNTER — Telehealth: Payer: Self-pay | Admitting: *Deleted

## 2022-09-01 ENCOUNTER — Encounter (HOSPITAL_BASED_OUTPATIENT_CLINIC_OR_DEPARTMENT_OTHER): Payer: Self-pay | Admitting: Cardiology

## 2022-09-01 ENCOUNTER — Ambulatory Visit: Payer: Self-pay

## 2022-09-01 ENCOUNTER — Ambulatory Visit (HOSPITAL_BASED_OUTPATIENT_CLINIC_OR_DEPARTMENT_OTHER): Payer: Medicare PPO | Admitting: Cardiology

## 2022-09-01 VITALS — BP 127/74 | HR 76 | Ht 61.0 in | Wt 134.2 lb

## 2022-09-01 DIAGNOSIS — E119 Type 2 diabetes mellitus without complications: Secondary | ICD-10-CM

## 2022-09-01 DIAGNOSIS — Z7189 Other specified counseling: Secondary | ICD-10-CM | POA: Diagnosis not present

## 2022-09-01 DIAGNOSIS — I10 Essential (primary) hypertension: Secondary | ICD-10-CM

## 2022-09-01 DIAGNOSIS — Z7984 Long term (current) use of oral hypoglycemic drugs: Secondary | ICD-10-CM | POA: Diagnosis not present

## 2022-09-01 DIAGNOSIS — Z8673 Personal history of transient ischemic attack (TIA), and cerebral infarction without residual deficits: Secondary | ICD-10-CM | POA: Diagnosis not present

## 2022-09-01 NOTE — Progress Notes (Signed)
Cardiology Office Note:    Date:  09/01/2022   ID:  Marie [name], DOB 1956/02/20, MRN [id number]  PCP:  Marie Sax, MD  Cardiologist:  Marie Red, MD  Referring MD: Marie [name],*   CC: Follow up  History of Present Illness:    Marie [name] is a 67 y.o. female with a hx of CVA, hypertension, type II diabetes, dyslipidemia, carotid stenosis who is seen for follow up today. I initially met her 05/01/19 as a new patient to me/prior patient of Marie [name] for the evaluation and management of CVA and hypertension.  CV history: history of CVA. Workup unrevealing, loop recorder has not shown atrial fibrillation during the entire life of the device (implanted 2015). Has had intermittent labile hypertension and dizziness.  Today, she is accompanied by her son. Generally, she is feeling much better. She denies any breathing issues or shortness of breath. Also she reports that her A1C has improved.   For exercise she is walking every day, at least 45 minutes to an hour. She denies any significant exertional symptoms. She recognizes the need to maintain her exercise and lifestyle changes to help keep her blood pressure controlled. She has been tolerating the change from lisinopril to valsartan since her last visit.  She denies any palpitations, chest pain, or peripheral edema. No lightheadedness, headaches, syncope, orthopnea, or PND.   Past Medical History:  Diagnosis Date   CVA (cerebral vascular accident) (HCC) 2015   Depression    Diabetes (HCC)    t2   Gallstones    Hyperlipemia    Hypertension    Migraines     Past Surgical History:  Procedure Laterality Date   BREAST BIOPSY Right 05/2019   CESAREAN SECTION     x2   CHOLECYSTECTOMY N/A 05/25/2016   Procedure: LAPAROSCOPIC CHOLECYSTECTOMY WITH INTRAOPERATIVE CHOLANGIOGRAM;  Surgeon: Marie Murphy, MD;  Location: [facility];  Service: General;  Laterality: N/A;   IR GENERIC HISTORICAL  03/13/2016   IR  PERC CHOLECYSTOSTOMY 03/13/2016 Marie Come, MD [facility]   IR GENERIC HISTORICAL  04/28/2016   IR RADIOLOGIST EVAL & MGMT 04/28/2016 Marie Moan, MD [facility]   LOOP RECORDER IMPLANT N/A 11/29/2013   Procedure: LOOP RECORDER IMPLANT;  Surgeon: Marie Maw, MD;  [facility];  Service: Cardiovascular;  Laterality: N/A;   TEE WITHOUT CARDIOVERSION N/A 08/24/2013   Procedure: TRANSESOPHAGEAL ECHOCARDIOGRAM (TEE);  Surgeon: Marie Fair, MD;  Location: [facility] ENDOSCOPY;  Service: Cardiovascular;  Laterality: N/A;    Current Medications: Current Outpatient Medications on File Prior to Visit  Medication Sig   ascorbic acid (VITAMIN C) 500 MG tablet Take 500 mg by mouth daily.   atorvastatin (LIPITOR) 80 MG tablet TAKE 1 TABLET EVERY DAY   Cholecalciferol (VITAMIN D3) 3000 units TABS Take 3,000 Units by mouth at bedtime.   Continuous Blood Gluc Sensor (FREESTYLE LIBRE 3 SENSOR) MISC Place 1 sensor on the skin every 14 days. Use to check glucose continuously   dapagliflozin propanediol (FARXIGA) 10 MG TABS tablet Take 1 tablet (10 mg total) by mouth daily.   escitalopram (LEXAPRO) 10 MG tablet TAKE 1 TABLET(10 MG) BY MOUTH DAILY   metFORMIN (GLUCOPHAGE-XR) 500 MG 24 hr tablet TAKE 1 TABLETS IN THE MORNING AND AT BEDTIME (Patient taking differently: 1,000 mg 2 (two) times daily with a meal. TAKE 1 TABLETS IN THE MORNING AND AT BEDTIME)   Multiple Vitamin (MULTIVITAMIN WITH MINERALS) TABS tablet Take 1 tablet by mouth daily.  repaglinide (PRANDIN) 2 MG tablet TAKE 2 TABLETS BEFORE BREAKFAST, TAKE 1 TABLET AT LUNCH, AND TAKE 1 TABLET BEFORE DINNER   traZODone (DESYREL) 50 MG tablet Take 1 tablet (50 mg total) by mouth at bedtime.   valsartan (DIOVAN) 80 MG tablet Take 1 tablet (80 mg total) by mouth daily.   No current facility-administered medications on file prior to visit.     Allergies:   Penicillins   Social History   Tobacco Use   Smoking status: Never   Smokeless  tobacco: Never  Vaping Use   Vaping Use: Never used  Substance Use Topics   Alcohol use: No    Alcohol/week: 1.0 standard drink of alcohol    Types: 1 Standard drinks or equivalent per week    Comment: Social   Drug use: No    Family History: family history includes Diabetes in her maternal grandfather and mother; High blood pressure in her maternal grandfather and mother; Stomach cancer in her mother. There is no history of Heart disease.  ROS:   Please see the history of present illness.   Additional pertinent ROS otherwise unremarkable.   EKGs/Labs/Other Studies Reviewed:    The following studies were reviewed today:  TEE 08/24/13 - Left ventricle: Systolic function was normal. Wall motion was    normal; there were no regional wall motion abnormalities.  - Left atrium: No evidence of thrombus in the atrial cavity or    appendage.  - Right atrium: No evidence of thrombus in the atrial cavity or    appendage.  - Atrial septum: No defect or patent foramen ovale was identified.    Echo contrast study showed no right-to-left atrial level shunt,    at baseline or with provocation. Echo contrast study showed no    right-to-left shunt, following an increase in RA pressure induced    by provocative maneuvers.    EKG:  EKG is personally reviewed.   09/01/2022:  EKG was not ordered. 05/29/2022:  EKG was not ordered. 02/06/2022: NSR at 88 bpm 02/12/2021: EKG was not ordered. 11/04/2020: NSR at 74 bpm. 11/20/2019: EKG was not ordered. 05/01/19: Sinus rhythm with sinus arrhythmia.  Recent Labs: 03/19/2022: Hemoglobin 13.2; Platelets 200.0 04/13/2022: TSH 2.87 08/17/2022: BUN 13; Creatinine, Ser 0.84; Potassium 4.8; Sodium 141   Recent Lipid Panel    Component Value Date/Time   CHOL 128 07/18/2021 0919   TRIG 105.0 07/18/2021 0919   HDL 48.30 07/18/2021 0919   CHOLHDL 3 07/18/2021 0919   VLDL 21.0 07/18/2021 0919   LDLCALC 58 07/18/2021 0919   LDLDIRECT 136.0 09/24/2020 0850     Physical Exam:    VS:  BP 127/74 (BP Location: Left Arm, Patient Position: Sitting, Cuff Size: Normal)   Pulse 76   Ht 5\' 1"  (1.549 m)   Wt 134 lb 3.2 oz (60.9 kg)   SpO2 97%   BMI 25.36 kg/m     Wt Readings from Last 3 Encounters:  09/01/22 134 lb 3.2 oz (60.9 kg)  08/20/22 134 lb 9.6 oz (61.1 kg)  06/11/22 135 lb (61.2 kg)    GEN: Well nourished, well developed in no acute distress HEENT: Normal, moist mucous membranes NECK: No JVD CARDIAC: regular rhythm, normal S1 and S2, no rubs or gallops. No murmur. VASCULAR: Radial and DP pulses 2+ bilaterally. No carotid bruits RESPIRATORY:  Clear to auscultation without rales, wheezing or rhonchi  ABDOMEN: Soft, non-tender, non-distended MUSCULOSKELETAL:  Ambulates independently SKIN: Warm and dry, no edema NEUROLOGIC:  Alert and  oriented x 3. No focal neuro deficits noted. PSYCHIATRIC:  Normal affect   ASSESSMENT:    1. Essential hypertension   2. History of CVA (cerebrovascular accident)   3. Type 2 diabetes mellitus without obesity (HCC)   4. Cardiac risk counseling     PLAN:    History of CVA:  -no further strokes -no detection of afib on loop -continue aspirin, atorvastatin  Hypertension: goal <130/80 -much improved with change from lisinopril to valsartan  Type II diabetes, without obesity, with history of ASCVD: -she is already on SGLT2i (dapagliflozin) -with history of stroke, would recommend GLP1RA, but this was stopped in the past. Defer to her diabetes team on this. Also on metformin and repaglinide currently, followed closely  Transportation is very difficult for her (she has to [place], and today she has her son with developmental delay with her). We have been able to get her transportation assistance for medical visits.  Cardiac risk counseling and secondary prevention recommendations: -recommend heart healthy/Mediterranean diet, with whole grains, fruits, vegetable, fish, lean meats, nuts, and olive oil.  Limit salt. -recommend moderate walking, 3-5 times/week for 30-50 minutes each session. Aim for at least 150 minutes.week. Goal should be pace of 3 miles/hours, or walking 1.5 miles in 30 minutes -recommend avoidance of tobacco products. Avoid excess alcohol.  Plan for follow up: 6 months or sooner as needed.  Marie Red, MD, PhD, [facility]   [facility]    Medication Adjustments/Labs and Tests Ordered: Current medicines are reviewed at length with the patient today.  Concerns regarding medicines are outlined above.   No orders of the defined types were placed in this encounter.  No orders of the defined types were placed in this encounter.  Patient Instructions  Medication Instructions:  No change *If you need a refill on your cardiac medications before your next appointment, please call your pharmacy*   Lab Work: None If you have labs (blood work) drawn today and your tests are completely normal, you will receive your results only by: MyChart Message (if you have MyChart) OR A paper copy in the mail If you have any lab test that is abnormal or we need to change your treatment, we will call you to review the results.   Testing/Procedures: none   Follow-Up: At [facility], you and your health needs are our priority.  As part of our continuing mission to provide you with exceptional heart care, we have created designated Provider Care Teams.  These Care Teams include your primary Cardiologist (physician) and Advanced Practice Providers (APPs -  Physician Assistants and Nurse Practitioners) who all work together to provide you with the care you need, when you need it.  We recommend signing up for the patient portal called "MyChart".  Sign up information is provided on this After Visit Summary.  MyChart is used to connect with patients for Virtual Visits (Telemedicine).  Patients are able to view lab/test results, encounter notes, upcoming  appointments, etc.  Non-urgent messages can be sent to your provider as well.   To learn more about what you can do with MyChart, go to [email or web address].    Your next appointment:   6 month(s)  Provider:   Jodelle Red, MD     [facility],acting as a scribe for Marie Red, MD.,have documented all relevant documentation on the behalf of Marie Red, MD,as directed by  Marie Red, MD while in the presence of Marie Red, MD.  I, Marie Red, MD, have reviewed all  documentation for this visit. The documentation on 09/01/22 for the exam, diagnosis, procedures, and orders are all accurate and complete.   Signed, Marie Red, MD PhD 09/01/2022  [facility]

## 2022-09-01 NOTE — Patient Instructions (Signed)
Medication Instructions:  No change *If you need a refill on your cardiac medications before your next appointment, please call your pharmacy*   Lab Work: None If you have labs (blood work) drawn today and your tests are completely normal, you will receive your results only by: MyChart Message (if you have MyChart) OR A paper copy in the mail If you have any lab test that is abnormal or we need to change your treatment, we will call you to review the results.   Testing/Procedures: none   Follow-Up: At [facility], you and your health needs are our priority.  As part of our continuing mission to provide you with exceptional heart care, we have created designated Provider Care Teams.  These Care Teams include your primary Cardiologist (physician) and Advanced Practice Providers (APPs -  Physician Assistants and Nurse Practitioners) who all work together to provide you with the care you need, when you need it.  We recommend signing up for the patient portal called "MyChart".  Sign up information is provided on this After Visit Summary.  MyChart is used to connect with patients for Virtual Visits (Telemedicine).  Patients are able to view lab/test results, encounter notes, upcoming appointments, etc.  Non-urgent messages can be sent to your provider as well.   To learn more about what you can do with MyChart, go to [email or web address].    Your next appointment:   6 month(s)  Provider:   Jodelle Red, MD

## 2022-09-01 NOTE — Patient Instructions (Signed)
Visit Information  Thank you for taking time to visit with me today. Please don't hesitate to contact me if I can be of assistance to you.   Following are the goals we discussed today:   Goals Addressed             This Visit's Progress    Diabetes Management       Patient Goals/Self Care Activities: -Patient/Caregiver will self-administer medications as prescribed as evidenced by self-report/primary caregiver report  -Patient/Caregiver will attend all scheduled provider appointments as evidenced by clinician review of documented attendance to scheduled appointments and patient/caregiver report -Patient/Caregiver will call provider office for new concerns or questions as evidenced by review of documented incoming telephone call notes and patient report  -check blood sugar at prescribed times -check blood sugar if I feel it is too high or too low -record values and write them down take them to all doctor visits  -limit carbohydrates and sweets  Patient blood sugars are  good with average of  147  Reiterated diabetes management.            Our next appointment is by telephone on 09/29/22 at 1000  Please call the care guide team at [phone] if you need to cancel or reschedule your appointment.   If you are experiencing a Mental Health or Behavioral Health Crisis or need someone to talk to, please call the Suicide and Crisis Lifeline: 988   Patient verbalizes understanding of instructions and care plan provided today and agrees to view in MyChart. Active MyChart status and patient understanding of how to access instructions and care plan via MyChart confirmed with patient.     The patient has been provided with contact information for the care management team and has been advised to call with any health related questions or concerns.   Bary Leriche, RN, MSN [facility] Care Management Care Management Coordinator Direct Line [phone]

## 2022-09-01 NOTE — Telephone Encounter (Signed)
   Telephone encounter was:  Successful.  09/01/2022 Name: ANNALISHA BEZDEK MRN: [id number] DOB: 1955-08-03  [name] is a 67 y.o. year old female who is a primary care patient of Mliss Sax, MD . The community resource team was consulted for assistance with Transportation Needs   Care guide performed the following interventions: Patient provided with information about care guide support team and interviewed to confirm resource needs.  Patient call to confirm ride today also got epic message from case manager ride was booked last week with patient  Follow Up Plan:  No further follow up planned at this time. The patient has been provided with needed resources.  Yehuda Mao Greenauer -[facility] [address], Population Health [phone] [address] [place] , [place] [address] Email : [email or web address]

## 2022-09-01 NOTE — Patient Outreach (Signed)
  Care Coordination   Follow Up Visit Note   09/01/2022 Name: Marie [name] MRN: [id number] DOB: 12/10/1955  Marie [name] is a 67 y.o. year old female who sees Mliss Sax, MD for primary care. I spoke with  Marie [name] by phone today.  What matters to the patients health and wellness today?  Diabetes management    Goals Addressed             This Visit's Progress    Diabetes Management       Patient Goals/Self Care Activities: -Patient/Caregiver will self-administer medications as prescribed as evidenced by self-report/primary caregiver report  -Patient/Caregiver will attend all scheduled provider appointments as evidenced by clinician review of documented attendance to scheduled appointments and patient/caregiver report -Patient/Caregiver will call provider office for new concerns or questions as evidenced by review of documented incoming telephone call notes and patient report  -check blood sugar at prescribed times -check blood sugar if I feel it is too high or too low -record values and write them down take them to all doctor visits  -limit carbohydrates and sweets  Patient blood sugars are  good with average of  147  Reiterated diabetes management.            SDOH assessments and interventions completed:  Yes     Care Coordination Interventions:  Yes, provided   Follow up plan: Follow up call scheduled for June    Encounter Outcome:  Pt. Visit Completed   Bary Leriche, RN, MSN [facility] Care Management Care Management Coordinator Direct Line [phone] '

## 2022-09-03 ENCOUNTER — Telehealth: Payer: Self-pay | Admitting: *Deleted

## 2022-09-03 NOTE — Telephone Encounter (Signed)
   Marie [name] DOB: 03/05/1956 MRN: [id number]   RIDER WAIVER AND RELEASE OF LIABILITY  For purposes of improving physical access to our facilities, [facility] is pleased to partner with third parties to provide [facility] patients or other authorized individuals the option of convenient, on-demand ground transportation services (the "Transport Services") through use of the technology service that enables users to request on-demand ground transportation from independent third-party providers.  By opting to use and accept these Transport Services, I, the undersigned, hereby agree on behalf of myself, and on behalf of any minor child using the Transport Services for whom I am the parent or legal guardian, as follows:  Transport Services provided to me are provided by independent third-party transportation providers who are not [facility] agents or employees and who are unaffiliated with [facility]. [facility] is neither a transportation carrier nor a common or public carrier. [facility] has no control over the quality or safety of the transportation that occurs as a result of the Transport Services. [facility] cannot guarantee that any third-party transportation provider will complete any arranged transportation service. [facility] makes no representation, warranty, or guarantee regarding the reliability, timeliness, quality, safety, suitability, or availability of any of the Transport Services or that they will be error free. I fully understand that traveling by vehicle involves risks and dangers of serious bodily injury, including permanent disability, paralysis, and death. I agree, on behalf of myself and on behalf of any minor child using the Transport Services for whom I am the parent or legal guardian, that the entire risk arising out of my use of the Transport Services remains solely with me, to the maximum extent permitted under applicable law. The Transport Services are provided "as  is" and "as available." [facility] disclaims all representations and warranties, express, implied or statutory, not expressly set out in these terms, including the implied warranties of merchantability and fitness for a particular purpose. I hereby waive and release [facility], its agents, employees, officers, directors, representatives, insurers, attorneys, assigns, successors, subsidiaries, and affiliates from any and all past, present, or future claims, demands, liabilities, actions, causes of action, or suits of any kind directly or indirectly arising from acceptance and use of the Transport Services. I further waive and release [facility] and its affiliates from all present and future liability and responsibility for any injury or death to persons or damages to property caused by or related to the use of the Transport Services. I have read this Waiver and Release of Liability, and I understand the terms used in it and their legal significance. This Waiver is freely and voluntarily given with the understanding that my right (as well as the right of any minor child for whom I am the parent or legal guardian using the Transport Services) to legal recourse against [facility] in connection with the Transport Services is knowingly surrendered in return for use of these services.   I attest that I read the consent document to Marie [name], gave Marie [name] the opportunity to ask questions and answered the questions asked (if any). I affirm that Marie [name] then provided consent for she's participation in this program.     Marie [name]                             

## 2022-09-07 DIAGNOSIS — H26491 Other secondary cataract, right eye: Secondary | ICD-10-CM | POA: Diagnosis not present

## 2022-09-07 DIAGNOSIS — E113412 Type 2 diabetes mellitus with severe nonproliferative diabetic retinopathy with macular edema, left eye: Secondary | ICD-10-CM | POA: Diagnosis not present

## 2022-09-07 DIAGNOSIS — H35371 Puckering of macula, right eye: Secondary | ICD-10-CM | POA: Diagnosis not present

## 2022-09-07 DIAGNOSIS — E113511 Type 2 diabetes mellitus with proliferative diabetic retinopathy with macular edema, right eye: Secondary | ICD-10-CM | POA: Diagnosis not present

## 2022-09-07 DIAGNOSIS — H2512 Age-related nuclear cataract, left eye: Secondary | ICD-10-CM | POA: Diagnosis not present

## 2022-09-07 DIAGNOSIS — H35021 Exudative retinopathy, right eye: Secondary | ICD-10-CM | POA: Diagnosis not present

## 2022-09-07 LAB — HM DIABETES EYE EXAM

## 2022-09-09 HISTORY — PX: DENTAL SURGERY: SHX609

## 2022-09-10 ENCOUNTER — Telehealth: Payer: Self-pay | Admitting: *Deleted

## 2022-09-10 NOTE — Telephone Encounter (Signed)
Telephone encounter was:  Successful.  09/10/2022 Name: CHESA MULDROW MRN: [id number] DOB: Jul 04, 1955  NORBERTA CROMARTIE is a 67 y.o. year old female who is a primary care patient of Mliss Sax, MD . The community resource team was consulted for assistance with Transportation Needs   Care guide performed the following interventions: Patient called to book ride on 6/21 to eye dr   Follow Up Plan:  No further follow up planned at this time. The patient has been provided with needed resources.  Yehuda Mao Greenauer -[facility] [place], Population Health [phone] [address] [place] , [place] [address] Email : [email or web address]       LEYANI VALVO DOB: 1956/02/02 MRN: [id number]   RIDER WAIVER AND RELEASE OF LIABILITY  For purposes of improving physical access to our facilities, [facility] is pleased to partner with third parties to provide [facility] patients or other authorized individuals the option of convenient, on-demand ground transportation services (the [employer]") through use of the technology service that enables users to request on-demand ground transportation from independent third-party providers.  By opting to use and accept these [employer], I, the undersigned, hereby agree on behalf of myself, and on behalf of any minor child using the Science writer for whom I am the parent or legal guardian, as follows:  Science writer provided to me are provided by independent third-party transportation providers who are not [employer] or employees and who are unaffiliated with [employer]. [facility] is neither a transportation carrier nor a common or public carrier. [facility] has no control over the quality or safety of the transportation that occurs as a result of the [employer]. [facility] cannot guarantee that any third-party transportation provider will complete any arranged transportation  service. [facility] makes no representation, warranty, or guarantee regarding the reliability, timeliness, quality, safety, suitability, or availability of any of the Transport Services or that they will be error free. I fully understand that traveling by vehicle involves risks and dangers of serious bodily injury, including permanent disability, paralysis, and death. I agree, on behalf of myself and on behalf of any minor child using the Transport Services for whom I am the parent or legal guardian, that the entire risk arising out of my use of the [employer] remains solely with me, to the maximum extent permitted under applicable law. The [employer] are provided "as is" and "as available." [facility] disclaims all representations and warranties, express, implied or statutory, not expressly set out in these terms, including the implied warranties of merchantability and fitness for a particular purpose. I hereby waive and release [facility], its agents, employees, officers, directors, representatives, insurers, attorneys, assigns, successors, subsidiaries, and affiliates from any and all past, present, or future claims, demands, liabilities, actions, causes of action, or suits of any kind directly or indirectly arising from acceptance and use of the [employer]. I further waive and release [facility] and its affiliates from all present and future liability and responsibility for any injury or death to persons or damages to property caused by or related to the use of the [employer]. I have read this Waiver and Release of Liability, and I understand the terms used in it and their legal significance. This Waiver is freely and voluntarily given with the understanding that my right (as well as the right of any minor child for whom I am the parent or legal guardian using the Transport  Services) to legal recourse against [facility] in connection with the Transport Services is  knowingly surrendered in return for use of these services.   I attest that I read the consent document to [name], gave Ms. Morefield the opportunity to ask questions and answered the questions asked (if any). I affirm that [name] then provided consent for she's participation in this program.     Dione Booze

## 2022-09-25 DIAGNOSIS — E113593 Type 2 diabetes mellitus with proliferative diabetic retinopathy without macular edema, bilateral: Secondary | ICD-10-CM | POA: Diagnosis not present

## 2022-09-25 LAB — HM DIABETES EYE EXAM

## 2022-09-29 ENCOUNTER — Ambulatory Visit: Payer: Self-pay

## 2022-09-29 NOTE — Patient Outreach (Signed)
  Care Coordination   09/29/2022 Name: Marie [name] MRN: [id number] DOB: 07-14-1955   Care Coordination Outreach Attempts:  An unsuccessful telephone outreach was attempted today to offer the patient information about available care coordination services.  Follow Up Plan:  Additional outreach attempts will be made to offer the patient care coordination information and services.   Encounter Outcome:  No Answer   Care Coordination Interventions:  No, not indicated    Bary Leriche, RN, MSN [facility] Care Management Care Management Coordinator Direct Line [phone]

## 2022-09-30 ENCOUNTER — Ambulatory Visit (INDEPENDENT_AMBULATORY_CARE_PROVIDER_SITE_OTHER): Payer: Medicare PPO | Admitting: Neurology

## 2022-09-30 ENCOUNTER — Ambulatory Visit: Payer: Medicare PPO | Admitting: Neurology

## 2022-09-30 DIAGNOSIS — R202 Paresthesia of skin: Secondary | ICD-10-CM

## 2022-09-30 MED ORDER — TRAZODONE HCL 50 MG PO TABS: 50.0000 mg | ORAL_TABLET | Freq: Every day | ORAL | 3 refills | Status: DC

## 2022-09-30 NOTE — Procedures (Signed)
Full Name: Marie [name] Gender: Female MRN #: [id number] Date of Birth: [birth year]    Visit Date: 09/30/2022 07:23 Age: 67 Years Examining Physician: Dr. Levert Feinstein Referring Physician: Dr. Levert Feinstein Height: 5 feet 1 inch History: 67 year old female, long history of diabetes, suboptimal control complains of bilateral feet paresthesia  Summary of the test:  Nerve conduction study: Bilateral sural, superficial peroneal sensory responses were normal.  Bilateral tibial, peroneal to EDB motor responses were normal.  Bilateral tibial H reflex was symmetric  Electromyography: Selected needle examinations of bilateral lower extremity muscles and lumbosacral paraspinal muscles were normal.  Conclusion: This is a normal study.  There is no electrodiagnostic evidence of large fiber neuropathy or bilateral lumbosacral radiculopathy    ------------------------------- Levert Feinstein, M.D.Ph.D.  [facility] [address] [place], [place] [address] Tel: [phone] Fax: [phone]  Verbal informed consent was obtained from the patient, patient was informed of potential risk of procedure, including bruising, bleeding, hematoma formation, infection, muscle weakness, muscle pain, numbness, among others.        MNC    Nerve / Sites Muscle Latency Ref. Amplitude Ref. Rel Amp Segments Distance Velocity Ref. Area    ms ms mV mV %  cm m/s m/s mVms  R Peroneal - EDB     Ankle EDB 4.5 ?6.5 5.2 ?2.0 100 Ankle - EDB 9   17.4     Fib head EDB 9.8  5.1  97.6 Fib head - Ankle 26 49 ?44 17.5     Pop fossa EDB 11.3  5.0  97.6 Pop fossa - Fib head 8.6 57 ?44 16.5         Pop fossa - Ankle      L Peroneal - EDB     Ankle EDB 4.5 ?6.5 5.5 ?2.0 100 Ankle - EDB 9   17.5     Fib head EDB 9.8  5.2  94.6 Fib head - Ankle 24 46 ?44 17.0     Pop fossa EDB 11.9  5.0  96.5 Pop fossa - Fib head 10 48 ?44 16.5         Pop fossa - Ankle      R Tibial - AH     Ankle AH 4.2 ?5.8  7.3 ?4.0 100 Ankle - AH 9   22.7     Pop fossa AH 11.6  6.5  88.1 Pop fossa - Ankle 32 43 ?41 22.0  L Tibial - AH     Ankle AH 4.3 ?5.8 8.9 ?4.0 100 Ankle - AH 9   29.9     Pop fossa AH 11.5  6.8  76.1 Pop fossa - Ankle 32 45 ?41 25.4             SNC    Nerve / Sites Rec. Site Peak Lat Ref.  Amp Ref. Segments Distance    ms ms V V  cm  R Sural - Ankle (Calf)     Calf Ankle 3.7 ?4.4 8 ?6 Calf - Ankle 14  L Sural - Ankle (Calf)     Calf Ankle 4.2 ?4.4 7 ?6 Calf - Ankle 14  R Superficial peroneal - Ankle     Lat leg Ankle 3.5 ?4.4 10 ?6 Lat leg - Ankle 14  L Superficial peroneal - Ankle     Lat leg Ankle 3.9 ?4.4 8 ?6 Lat leg - Ankle 14  F  Wave    Nerve F Lat Ref.   ms ms  R Tibial - AH 48.7 ?56.0  L Tibial - AH 48.8 ?56.0         H Reflex    Nerve H Lat Lat Hmax   ms ms   Left Right Ref. Left Right Ref.  Tibial - [place] 35.1 34.0 ?35.0 33.8 30.4 ?35.0         EMG Summary Table    Spontaneous MUAP Recruitment  Muscle IA Fib PSW Fasc Other Amp Dur. Poly Pattern  R. Tibialis posterior Normal None None None _______ Normal Normal Normal Normal  R. Tibialis anterior Normal None None None _______ Normal Normal Normal Normal  R. Peroneus longus Normal None None None _______ Normal Normal Normal Normal  R. Vastus lateralis Normal None None None _______ Normal Normal Normal Normal  R. Gastrocnemius (Medial head) Normal None None None _______ Normal Normal Normal Normal  L. Tibialis anterior Normal None None None _______ Normal Normal Normal Normal  L. Tibialis posterior Normal None None None _______ Normal Normal Normal Normal  L. Peroneus longus Normal None None None _______ Normal Normal Normal Normal  L. Vastus lateralis Normal None None None _______ Normal Normal Normal Normal  L. Gastrocnemius (Medial head) Normal None None None _______ Normal Normal Normal Normal  R. Lumbar paraspinals (low) Normal None None None _______ Normal Normal Normal Normal  R. Lumbar  paraspinals (mid) Normal None None None _______ Normal Normal Normal Normal  L. Lumbar paraspinals (low) Normal None None None _______ Normal Normal Normal Normal  L. Lumbar paraspinals (mid) Normal None None None _______ Normal Normal Normal Normal

## 2022-10-02 ENCOUNTER — Telehealth: Payer: Self-pay

## 2022-10-02 NOTE — Patient Outreach (Signed)
  Care Coordination   Follow Up Visit Note   10/02/2022 Name: Marie [name] MRN: [id number] DOB: 27-Nov-1955  Marie [name] is a 67 y.o. year old female who sees Marie Sax, MD for primary care. I spoke with  Marie [name] by phone today.  What matters to the patients health and wellness today?  diabetes    Goals Addressed             This Visit's Progress    Diabetes Management       Patient Goals/Self Care Activities: -Patient/Caregiver will self-administer medications as prescribed as evidenced by self-report/primary caregiver report  -Patient/Caregiver will attend all scheduled provider appointments as evidenced by clinician review of documented attendance to scheduled appointments and patient/caregiver report -Patient/Caregiver will call provider office for new concerns or questions as evidenced by review of documented incoming telephone call notes and patient report  -check blood sugar at prescribed times -check blood sugar if I feel it is too high or too low -record values and write them down take them to all doctor visits  -limit carbohydrates and sweets  Patient blood sugars are  good with average of 119-127  Discussed diabetes management.            SDOH assessments and interventions completed:  Yes     Care Coordination Interventions:  Yes, provided   Follow up plan: Follow up call scheduled for August    Encounter Outcome:  Pt. Visit Completed   Marie Leriche, RN, MSN [facility] Care Management Care Management Coordinator Direct Line [phone]

## 2022-10-02 NOTE — Patient Instructions (Signed)
Visit Information  Thank you for taking time to visit with me today. Please don't hesitate to contact me if I can be of assistance to you.   Following are the goals we discussed today:   Goals Addressed             This Visit's Progress    Diabetes Management       Patient Goals/Self Care Activities: -Patient/Caregiver will self-administer medications as prescribed as evidenced by self-report/primary caregiver report  -Patient/Caregiver will attend all scheduled provider appointments as evidenced by clinician review of documented attendance to scheduled appointments and patient/caregiver report -Patient/Caregiver will call provider office for new concerns or questions as evidenced by review of documented incoming telephone call notes and patient report  -check blood sugar at prescribed times -check blood sugar if I feel it is too high or too low -record values and write them down take them to all doctor visits  -limit carbohydrates and sweets  Patient blood sugars are  good with average of 119-127  Discussed diabetes management.            Our next appointment is by telephone on 11/11/22 at 1000 am  Please call the care guide team at [phone] if you need to cancel or reschedule your appointment.   If you are experiencing a Mental Health or Behavioral Health Crisis or need someone to talk to, please call the Suicide and Crisis Lifeline: 988   Patient verbalizes understanding of instructions and care plan provided today and agrees to view in MyChart. Active MyChart status and patient understanding of how to access instructions and care plan via MyChart confirmed with patient.     The patient has been provided with contact information for the care management team and has been advised to call with any health related questions or concerns.   Bary Leriche, RN, MSN [facility] Care Management Care Management Coordinator Direct Line [phone]

## 2022-10-09 ENCOUNTER — Ambulatory Visit (INDEPENDENT_AMBULATORY_CARE_PROVIDER_SITE_OTHER): Payer: Medicare PPO | Admitting: Family Medicine

## 2022-10-09 ENCOUNTER — Encounter: Payer: Self-pay | Admitting: Family Medicine

## 2022-10-09 VITALS — BP 136/76 | HR 60 | Temp 97.3°F | Ht 61.0 in | Wt 131.0 lb

## 2022-10-09 DIAGNOSIS — I10 Essential (primary) hypertension: Secondary | ICD-10-CM | POA: Diagnosis not present

## 2022-10-09 DIAGNOSIS — N3289 Other specified disorders of bladder: Secondary | ICD-10-CM | POA: Diagnosis not present

## 2022-10-09 DIAGNOSIS — E7800 Pure hypercholesterolemia, unspecified: Secondary | ICD-10-CM

## 2022-10-09 NOTE — Progress Notes (Signed)
Pends is a  Established Patient Office Visit   Subjective:  Patient ID: Marie [name], female    DOB: 10-01-1955  Age: 67 y.o. MRN: [id number]  Chief Complaint  Patient presents with   Annual Exam    CPE- Pt is not fasting. Pt had coffee and toast this morning. Pt has no concerns. Wanted Dr. Doreene Burke to know she has had dental surgery.     HPI Encounter Diagnoses  Name Primary?   Irritable bladder Yes   Essential hypertension    Elevated LDL cholesterol level    For follow-up of above.  She stopped taking her metformin because she read on [employer] that it was no longer FDA approved.  She tells of ongoing issues with urinary leakage.  Often feels urinary urgency and may not make it to the bathroom without leaking some urine.  There may be leakage of urine with cough and sneeze.  Difficult for her to watch a movie without having to urinate.  Denies dysuria.  She is not active sexually.  Continues high-dose atorvastatin and Diovan for elevated cholesterol and hypertension.  No issues taking these medications.   Review of Systems  Constitutional: Negative.   HENT: Negative.    Eyes:  Negative for blurred vision, discharge and redness.  Respiratory: Negative.    Cardiovascular: Negative.   Gastrointestinal:  Negative for abdominal pain.  Genitourinary: Negative.   Musculoskeletal: Negative.  Negative for myalgias.  Skin:  Negative for rash.  Neurological:  Negative for tingling, loss of consciousness and weakness.  Endo/Heme/Allergies:  Negative for polydipsia.     Current Outpatient Medications:    ascorbic acid (VITAMIN C) 500 MG tablet, Take 500 mg by mouth daily., Disp: , Rfl:    atorvastatin (LIPITOR) 80 MG tablet, TAKE 1 TABLET EVERY DAY, Disp: 90 tablet, Rfl: 3   Cholecalciferol (VITAMIN D3) 3000 units TABS, Take 3,000 Units by mouth at bedtime., Disp: , Rfl:    Continuous Blood Gluc Sensor (FREESTYLE LIBRE 3 SENSOR) MISC, Place 1 sensor on the skin every 14 days. Use to check  glucose continuously, Disp: 6 each, Rfl: 3   dapagliflozin propanediol (FARXIGA) 10 MG TABS tablet, Take 1 tablet (10 mg total) by mouth daily., Disp: 90 tablet, Rfl: 1   escitalopram (LEXAPRO) 10 MG tablet, TAKE 1 TABLET(10 MG) BY MOUTH DAILY, Disp: 90 tablet, Rfl: 1   Multiple Vitamin (MULTIVITAMIN WITH MINERALS) TABS tablet, Take 1 tablet by mouth daily., Disp: , Rfl:    repaglinide (PRANDIN) 2 MG tablet, TAKE 2 TABLETS BEFORE BREAKFAST, TAKE 1 TABLET AT LUNCH, AND TAKE 1 TABLET BEFORE DINNER, Disp: 360 tablet, Rfl: 3   traZODone (DESYREL) 50 MG tablet, Take 1 tablet (50 mg total) by mouth at bedtime., Disp: 90 tablet, Rfl: 3   valsartan (DIOVAN) 80 MG tablet, Take 1 tablet (80 mg total) by mouth daily., Disp: 90 tablet, Rfl: 3   metFORMIN (GLUCOPHAGE-XR) 500 MG 24 hr tablet, TAKE 1 TABLETS IN THE MORNING AND AT BEDTIME (Patient not taking: Reported on 10/09/2022), Disp: 180 tablet, Rfl: 3   Objective:     BP 136/76   Pulse 60   Temp (!) 97.3 F (36.3 C)   Ht 5\' 1"  (1.549 m)   Wt 131 lb (59.4 kg)   BMI 24.75 kg/m    Physical Exam Constitutional:      General: She is not in acute distress.    Appearance: Normal appearance. She is not ill-appearing, toxic-appearing or diaphoretic.  HENT:  Head: Normocephalic and atraumatic.     Right Ear: External ear normal.     Left Ear: External ear normal.     Mouth/Throat:     Mouth: Mucous membranes are moist.     Pharynx: Oropharynx is clear. No oropharyngeal exudate or posterior oropharyngeal erythema.  Eyes:     General: No scleral icterus.       Right eye: No discharge.        Left eye: No discharge.     Extraocular Movements: Extraocular movements intact.     Conjunctiva/sclera: Conjunctivae normal.     Pupils: Pupils are equal, round, and reactive to light.  Cardiovascular:     Rate and Rhythm: Normal rate and regular rhythm.  Pulmonary:     Effort: Pulmonary effort is normal. No respiratory distress.     Breath sounds:  Normal breath sounds. No wheezing, rhonchi or rales.  Abdominal:     General: Bowel sounds are normal.  Musculoskeletal:     Cervical back: No rigidity or tenderness.  Skin:    General: Skin is warm and dry.  Neurological:     Mental Status: She is alert and oriented to person, place, and time.  Psychiatric:        Mood and Affect: Mood normal.        Behavior: Behavior normal.      No results found for any visits on 10/09/22.    The ASCVD Risk score (Arnett DK, et al., 2019) failed to calculate for the following reasons:   The patient has a prior MI or stroke diagnosis    Assessment & Plan:   Irritable bladder -     Urinalysis, Routine w reflex microscopic; Future -     Urine Culture; Future  Essential hypertension -     CBC; Future -     Comprehensive metabolic panel; Future  Elevated LDL cholesterol level -     Comprehensive metabolic panel; Future -     Lipid panel; Future    Return in about 6 months (around 04/11/2023), or Return well-hydrated and fasting for ordered blood work..  Will restart metformin as directed by Dr. Lucianne Muss.  Return well-hydrated and fasting for above ordered lab work.  Will decrease coffee intake and see if this helps.  Could consider a medication if not.  She will return as needed.  Mliss Sax, MD

## 2022-10-12 ENCOUNTER — Other Ambulatory Visit: Payer: Medicare PPO

## 2022-10-12 ENCOUNTER — Telehealth: Payer: Self-pay | Admitting: Family Medicine

## 2022-10-12 NOTE — Telephone Encounter (Signed)
Pt want to know if she has to fast for her lab appt on 7.15.24

## 2022-10-13 DIAGNOSIS — H26491 Other secondary cataract, right eye: Secondary | ICD-10-CM | POA: Diagnosis not present

## 2022-10-13 DIAGNOSIS — H35021 Exudative retinopathy, right eye: Secondary | ICD-10-CM | POA: Diagnosis not present

## 2022-10-13 DIAGNOSIS — H2512 Age-related nuclear cataract, left eye: Secondary | ICD-10-CM | POA: Diagnosis not present

## 2022-10-13 DIAGNOSIS — H35371 Puckering of macula, right eye: Secondary | ICD-10-CM | POA: Diagnosis not present

## 2022-10-13 DIAGNOSIS — E113511 Type 2 diabetes mellitus with proliferative diabetic retinopathy with macular edema, right eye: Secondary | ICD-10-CM | POA: Diagnosis not present

## 2022-10-13 DIAGNOSIS — E113412 Type 2 diabetes mellitus with severe nonproliferative diabetic retinopathy with macular edema, left eye: Secondary | ICD-10-CM | POA: Diagnosis not present

## 2022-10-16 DIAGNOSIS — H40013 Open angle with borderline findings, low risk, bilateral: Secondary | ICD-10-CM | POA: Diagnosis not present

## 2022-10-19 ENCOUNTER — Other Ambulatory Visit (INDEPENDENT_AMBULATORY_CARE_PROVIDER_SITE_OTHER): Payer: Medicare PPO

## 2022-10-19 DIAGNOSIS — E7800 Pure hypercholesterolemia, unspecified: Secondary | ICD-10-CM

## 2022-10-19 DIAGNOSIS — I10 Essential (primary) hypertension: Secondary | ICD-10-CM | POA: Diagnosis not present

## 2022-10-19 DIAGNOSIS — N3289 Other specified disorders of bladder: Secondary | ICD-10-CM | POA: Diagnosis not present

## 2022-10-19 DIAGNOSIS — N3000 Acute cystitis without hematuria: Secondary | ICD-10-CM

## 2022-10-19 LAB — LIPID PANEL
Cholesterol: 112 mg/dL (ref 0–200)
HDL: 45.1 mg/dL (ref >39.00)
LDL Cholesterol: 52 mg/dL (ref 0–99)
NonHDL: 66.48
Total CHOL/HDL Ratio: 2
Triglycerides: 71 mg/dL (ref 0.0–149.0)
VLDL: 14.2 mg/dL (ref 0.0–40.0)

## 2022-10-19 LAB — COMPREHENSIVE METABOLIC PANEL
ALT: 24 U/L (ref 0–35)
AST: 19 U/L (ref 0–37)
Albumin: 4.2 g/dL (ref 3.5–5.2)
Alkaline Phosphatase: 63 U/L (ref 39–117)
BUN: 12 mg/dL (ref 6–23)
CO2: 30 mEq/L (ref 19–32)
Calcium: 9.4 mg/dL (ref 8.4–10.5)
Chloride: 107 mEq/L (ref 96–112)
Creatinine, Ser: 0.73 mg/dL (ref 0.40–1.20)
GFR: 84.52 mL/min (ref >60.00)
Glucose, Bld: 115 mg/dL — ABNORMAL HIGH (ref 70–99)
Potassium: 4.4 mEq/L (ref 3.5–5.1)
Sodium: 142 mEq/L (ref 135–145)
Total Bilirubin: 0.8 mg/dL (ref 0.2–1.2)
Total Protein: 6.7 g/dL (ref 6.0–8.3)

## 2022-10-19 LAB — CBC
HCT: 41.5 % (ref 36.0–46.0)
Hemoglobin: 13.3 g/dL (ref 12.0–15.0)
MCHC: 32.1 g/dL (ref 30.0–36.0)
MCV: 87.7 fl (ref 78.0–100.0)
Platelets: 165 10*3/uL (ref 150.0–400.0)
RBC: 4.73 Mil/uL (ref 3.87–5.11)
RDW: 14.8 % (ref 11.5–15.5)
WBC: 6.2 10*3/uL (ref 4.0–10.5)

## 2022-10-21 LAB — URINE CULTURE
MICRO NUMBER:: 15200429
SPECIMEN QUALITY:: ADEQUATE

## 2022-10-22 MED ORDER — SULFAMETHOXAZOLE-TRIMETHOPRIM 800-160 MG PO TABS
1.0000 | ORAL_TABLET | Freq: Two times a day (BID) | ORAL | 0 refills | Status: AC
Start: 2022-10-22 — End: 2022-10-29

## 2022-10-22 NOTE — Addendum Note (Signed)
Addended by: Nadene Rubins A on: 10/22/2022 01:06 PM   Modules accepted: Orders

## 2022-10-22 NOTE — Progress Notes (Signed)
Urine culture was positive for E. coli sensitive to Septra.  Will send Rx for Septra DS every 12 for 7 days to her pharmacy

## 2022-10-27 ENCOUNTER — Ambulatory Visit: Payer: Medicare PPO | Admitting: Dermatology

## 2022-10-30 ENCOUNTER — Telehealth: Payer: Self-pay

## 2022-10-30 NOTE — Telephone Encounter (Signed)
Pt here in the office today expressing concerns about 2 separate phone calls she received, one stating lab work was fine, the other stating she had medication called in to pharmacy.  Explained to pt that lab work resulted the same day it was collected and was normal, which MA called to notify her. However, urine culture takes a few days to come back. Urine culture was positive and script sent in, which was the reason for a second call reporting a positive result.  Pt expresses understanding. No further questions or concerns.

## 2022-11-02 ENCOUNTER — Ambulatory Visit (INDEPENDENT_AMBULATORY_CARE_PROVIDER_SITE_OTHER): Payer: Medicare PPO

## 2022-11-02 DIAGNOSIS — Z0000 Encounter for general adult medical examination without abnormal findings: Secondary | ICD-10-CM

## 2022-11-02 DIAGNOSIS — E2839 Other primary ovarian failure: Secondary | ICD-10-CM | POA: Diagnosis not present

## 2022-11-02 DIAGNOSIS — Z0279 Encounter for issue of other medical certificate: Secondary | ICD-10-CM

## 2022-11-02 NOTE — Progress Notes (Signed)
Subjective:   Marie [name] is a 67 y.o. female who presents for Medicare Annual (Subsequent) preventive examination.  Visit Complete: Virtual  I connected with  Marie [name] on 11/02/22 by a audio enabled telemedicine application and verified that I am speaking with the correct person using two identifiers.  Patient Location: Home  Provider Location: Office/Clinic  I discussed the limitations of evaluation and management by telemedicine. The patient expressed understanding and agreed to proceed.  Vital Signs: Patient was unable to self-report vital signs via telehealth due to a lack of equipment at home.  Review of Systems     Cardiac Risk Factors include: advanced age (>47men, >69 women);diabetes mellitus;dyslipidemia;hypertension     Objective:    Today's Vitals   There is no height or weight on file to calculate BMI.     11/02/2022    1:05 PM 05/25/2016    4:00 PM 05/21/2016   11:21 AM 03/13/2016    1:00 AM 03/08/2016    9:14 AM 01/23/2015    4:40 PM 11/29/2013    9:13 AM  Advanced Directives  Does Patient Have a Medical Advance Directive? No No No No No No No  Would patient like information on creating a medical advance directive?  No - Patient declined No - Patient declined No - Patient declined   Yes - Educational materials given    Current Medications (verified) Outpatient Encounter Medications as of 11/02/2022  Medication Sig   ascorbic acid (VITAMIN C) 500 MG tablet Take 500 mg by mouth daily.   atorvastatin (LIPITOR) 80 MG tablet TAKE 1 TABLET EVERY DAY   Cholecalciferol (VITAMIN D3) 3000 units TABS Take 3,000 Units by mouth at bedtime.   Continuous Blood Gluc Sensor (FREESTYLE LIBRE 3 SENSOR) MISC Place 1 sensor on the skin every 14 days. Use to check glucose continuously   dapagliflozin propanediol (FARXIGA) 10 MG TABS tablet Take 1 tablet (10 mg total) by mouth daily.   escitalopram (LEXAPRO) 10 MG tablet TAKE 1 TABLET(10 MG) BY MOUTH DAILY   metFORMIN  (GLUCOPHAGE-XR) 500 MG 24 hr tablet TAKE 1 TABLETS IN THE MORNING AND AT BEDTIME   Multiple Vitamin (MULTIVITAMIN WITH MINERALS) TABS tablet Take 1 tablet by mouth daily.   repaglinide (PRANDIN) 2 MG tablet TAKE 2 TABLETS BEFORE BREAKFAST, TAKE 1 TABLET AT LUNCH, AND TAKE 1 TABLET BEFORE DINNER   traZODone (DESYREL) 50 MG tablet Take 1 tablet (50 mg total) by mouth at bedtime.   valsartan (DIOVAN) 80 MG tablet Take 1 tablet (80 mg total) by mouth daily.   No facility-administered encounter medications on file as of 11/02/2022.    Allergies (verified) Penicillins   History: Past Medical History:  Diagnosis Date   CVA (cerebral vascular accident) (HCC) 2015   Depression    Diabetes (HCC)    t2   Gallstones    Hyperlipemia    Hypertension    Migraines    Past Surgical History:  Procedure Laterality Date   BREAST BIOPSY Right 05/2019   CESAREAN SECTION     x2   CHOLECYSTECTOMY N/A 05/25/2016   Procedure: LAPAROSCOPIC CHOLECYSTECTOMY WITH INTRAOPERATIVE CHOLANGIOGRAM;  Surgeon: Marie Murphy, MD;  Location: [facility];  Service: General;  Laterality: N/A;   DENTAL SURGERY Left 09/09/2022   IR GENERIC HISTORICAL  03/13/2016   IR PERC CHOLECYSTOSTOMY 03/13/2016 Marie Come, MD [facility] RAD   IR GENERIC HISTORICAL  04/28/2016   IR RADIOLOGIST EVAL & MGMT 04/28/2016 [name], MD [facility] INTER[facility] RAD  LOOP RECORDER IMPLANT N/A 11/29/2013   Procedure: LOOP RECORDER IMPLANT;  Surgeon: Marie Maw, MD;  Location: [facility] CATH LAB;  Service: Cardiovascular;  Laterality: N/A;   TEE WITHOUT CARDIOVERSION N/A 08/24/2013   Procedure: TRANSESOPHAGEAL ECHOCARDIOGRAM (TEE);  Surgeon: Marie Fair, MD;  Location: [facility] ENDOSCOPY;  Service: Cardiovascular;  Laterality: N/A;   Family History  Problem Relation Age of Onset   Stomach cancer Mother    High blood pressure Mother    Diabetes Mother    Diabetes Maternal Grandfather    High blood pressure Maternal Grandfather    Heart disease Neg Hx     Social History   Socioeconomic History   Marital status: Married    Spouse name: Not on file   Number of children: 3   Years of education: MBA   Highest education level: Not on file  Occupational History    Employer: [employer]    Comment: [employer]  Tobacco Use   Smoking status: Never   Smokeless tobacco: Never  Vaping Use   Vaping status: Never Used  Substance and Sexual Activity   Alcohol use: No    Alcohol/week: 1.0 standard drink of alcohol    Types: 1 Standard drinks or equivalent per week    Comment: Social   Drug use: No   Sexual activity: Not on file  Other Topics Concern   Not on file  Social History Narrative   Patient lives at home with her husband (Marie [name]).   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         Social Determinants of Health   Financial Resource Strain: Low Risk  (11/02/2022)   Overall Financial Resource Strain (CARDIA)    Difficulty of Paying Living Expenses: Not hard at all  Food Insecurity: No Food Insecurity (11/02/2022)   Hunger Vital Sign    Worried About Running Out of Food in the Last Year: Never true    Ran Out of Food in the Last Year: Never true  Transportation Needs: No Transportation Needs (11/02/2022)   PRAPARE - Administrator, Civil Service (Medical): No    Lack of Transportation (Non-Medical): No  Physical Activity: Sufficiently Active (11/02/2022)   Exercise Vital Sign    Days of Exercise per Week: 4 days    Minutes of Exercise per Session: 50 min  Stress: No Stress Concern Present (11/02/2022)   [employer] of Occupational Health - Occupational Stress Questionnaire    Feeling of Stress : Only a little  Social Connections: Moderately Isolated (11/02/2022)   Social Connection and Isolation Panel [NHANES]    Frequency of Communication with Friends and Family: More than three times a week    Frequency of Social Gatherings with  Friends and Family: Once a week    Attends Religious Services: Never    Database administrator or Organizations: No    Attends Engineer, structural: Never    Marital Status: Married    Tobacco Counseling Counseling given: Not Answered   Clinical Intake:  Pre-visit preparation completed: Yes  Pain : No/denies pain     Nutritional Risks: None Diabetes: Yes CBG done?: No Did pt. bring in CBG monitor from home?: No  How often do you need to have someone help you when you read instructions, pamphlets, or other written materials from your doctor or pharmacy?: 1 - Never  Interpreter Needed?: No  Information entered by :: NAllen LPN   Activities of Daily Living    11/02/2022   12:57 PM  In your present state of health, do you have any difficulty performing the following activities:  Hearing? 1  Comment feels hears less than before  Vision? 1  Comment due to diabetes  Difficulty concentrating or making decisions? 0  Walking or climbing stairs? 0  Dressing or bathing? 0  Doing errands, shopping? 0  Preparing Food and eating ? N  Using the Toilet? N  In the past six months, have you accidently leaked urine? Y  Comment has spoke to doctor  Do you have problems with loss of bowel control? N  Managing your Medications? N  Managing your Finances? N  Housekeeping or managing your Housekeeping? N    Patient Care Team: Mliss Sax, MD as PCP - General (Family Medicine) Jodelle Red, MD as PCP - Cardiology (Cardiology) Levert Feinstein, MD as Consulting Physician (Neurology) Fleeta Emmer, RN as [facility] Care Management  Indicate any recent Medical Services you may have received from other than [facility] providers in the past year (date may be approximate).     Assessment:   This is a routine wellness examination for [place].  Hearing/Vision screen Hearing Screening - Comments:: Feels hears less that before Vision Screening -  Comments:: Regular eye exams, Retina and Diabetic [facility], Dr. Luciana Axe  Dietary issues and exercise activities discussed:     Goals Addressed             This Visit's Progress    Patient Stated       11/02/2022, wants to lower A1C       Depression Screen    11/02/2022    1:10 PM 04/13/2022    1:27 PM 03/19/2022    3:36 PM 01/14/2022   11:45 AM 12/09/2021    3:07 PM 11/13/2021    2:17 PM 10/20/2021    4:07 PM  PHQ 2/9 Scores  PHQ - 2 Score 0 0 0 1 0 3 0  PHQ- 9 Score 1     8     Fall Risk    11/02/2022    1:08 PM 04/13/2022    1:27 PM 03/19/2022    3:36 PM 12/09/2021    3:08 PM 11/13/2021    1:40 PM  Fall Risk   Falls in the past year? 0 0 0 0 1  Number falls in past yr: 0 0 0 0 1  Injury with Fall? 0 0 0  1  Risk for fall due to : Medication side effect No Fall Risks No Fall Risks    Follow up Falls prevention discussed;Falls evaluation completed Falls evaluation completed Falls evaluation completed      MEDICARE RISK AT HOME:  Medicare Risk at Home - 11/02/22 1309     Any stairs in or around the home? Yes    If so, are there any without handrails? No    Home free of loose throw rugs in walkways, pet beds, electrical cords, etc? Yes    Adequate lighting in your home to reduce risk of falls? Yes    Life alert? No    Use of a cane, walker or w/c? No    Grab bars in the bathroom? No    Shower chair or bench in shower? Yes    Elevated toilet seat or a handicapped toilet? No             TIMED  UP AND GO:  Was the test performed?  No    Cognitive Function:        11/02/2022    1:11 PM  6CIT Screen  What Year? 0 points  What month? 0 points  What time? 0 points  Count back from 20 0 points  Months in reverse 0 points  Repeat phrase 0 points  Total Score 0 points    Immunizations Immunization History  Administered Date(s) Administered   Fluad Quad(high Dose 65+) 01/24/2021, 12/19/2021   Influenza,inj,Quad PF,6+ Mos 02/05/2018, 11/23/2018    Influenza-Unspecified 12/22/2019   PFIZER(Purple Top)SARS-COV-2 Vaccination 06/01/2019, 06/23/2019, 01/19/2020   Pneumococcal Polysaccharide-23 07/04/2020   Tdap 12/27/2019   Zoster Recombinant(Shingrix) 07/20/2017, 01/06/2018    TDAP status: Up to date  Flu Vaccine status: Up to date  Pneumococcal vaccine status: Due, Education has been provided regarding the importance of this vaccine. Advised may receive this vaccine at local pharmacy or Health Dept. Aware to provide a copy of the vaccination record if obtained from local pharmacy or Health Dept. Verbalized acceptance and understanding.  Covid-19 vaccine status: Completed vaccines  Qualifies for Shingles Vaccine? Yes   Zostavax completed Yes   Shingrix Completed?: Yes  Screening Tests Health Maintenance  Topic Date Due   DEXA SCAN  Never done   Pneumonia Vaccine 74+ Years old (2 of 2 - PCV) 07/04/2021   INFLUENZA VACCINE  11/05/2022   HEMOGLOBIN A1C  02/20/2023   Diabetic kidney evaluation - Urine ACR  05/21/2023   FOOT EXAM  08/20/2023   OPHTHALMOLOGY EXAM  09/25/2023   Diabetic kidney evaluation - eGFR measurement  10/19/2023   Medicare Annual Wellness (AWV)  11/02/2023   MAMMOGRAM  03/24/2024   Colonoscopy  01/14/2028   DTaP/Tdap/Td (2 - Td or Tdap) 12/26/2029   Hepatitis C Screening  Completed   Zoster Vaccines- Shingrix  Completed   HPV VACCINES  Aged Out   COVID-19 Vaccine  Discontinued    Health Maintenance  Health Maintenance Due  Topic Date Due   DEXA SCAN  Never done   Pneumonia Vaccine 41+ Years old (2 of 2 - PCV) 07/04/2021    Colorectal cancer screening: Type of screening: Colonoscopy. Completed 01/13/2018. Repeat every 10 years  Mammogram status: Completed 03/24/2022. Repeat every year  Bone Density status: Ordered today. Pt provided with contact info and advised to call to schedule appt.  Lung Cancer Screening: (Low Dose CT Chest recommended if Age 39-80 years, 20 pack-year currently smoking OR  have quit w/in 15years.) does not qualify.   Lung Cancer Screening Referral: no  Additional Screening:  Hepatitis C Screening: does qualify; Completed 12/12/2020  Vision Screening: Recommended annual ophthalmology exams for early detection of glaucoma and other disorders of the eye. Is the patient up to date with their annual eye exam?  Yes  Who is the provider or what is the name of the office in which the patient attends annual eye exams? Dr. Luciana Axe If pt is not established with a provider, would they like to be referred to a provider to establish care? No .   Dental Screening: Recommended annual dental exams for proper oral hygiene  Diabetic Foot Exam: Diabetic Foot Exam: Completed 08/20/2022  Community Resource Referral / Chronic Care Management: CRR required this visit?  No   CCM required this visit?  No     Plan:     I have personally reviewed and noted the following in the patient's chart:   Medical and social history Use of  alcohol, tobacco or illicit drugs  Current medications and supplements including opioid prescriptions. Patient is not currently taking opioid prescriptions. Functional ability and status Nutritional status Physical activity Advanced directives List of other physicians Hospitalizations, surgeries, and ER visits in previous 12 months Vitals Screenings to include cognitive, depression, and falls Referrals and appointments  In addition, I have reviewed and discussed with patient certain preventive protocols, quality metrics, and best practice recommendations. A written personalized care plan for preventive services as well as general preventive health recommendations were provided to patient.     Barb Merino, LPN   [id number]   After Visit Summary: (MyChart) Due to this being a telephonic visit, the after visit summary with patients personalized plan was offered to patient via MyChart   Nurse Notes: none

## 2022-11-02 NOTE — Patient Instructions (Signed)
Marie [name] , Thank you for taking time to come for your Medicare Wellness Visit. I appreciate your ongoing commitment to your health goals. Please review the following plan we discussed and let me know if I can assist you in the future.   Referrals/Orders/Follow-Ups/Clinician Recommendations: dexa  You have an order for:  []   2D Mammogram  []   3D Mammogram  [x]   Bone Density     Please call for appointment:  [facility] [address] [place], [place] [address] [phone]   Make sure to wear two-piece clothing.  No lotions, powders, or deodorants the day of the appointment. Make sure to bring picture ID and insurance card.  Bring list of medications you are currently taking including any supplements.   Schedule your [facility] screening mammogram through MyChart!   Log into your MyChart account.  Go to 'Visit' (or 'Appointments' if on mobile App) --> Schedule an Appointment  Under 'Select a Reason for Visit' choose the Mammogram Screening option.  Complete the pre-visit questions and select the time and place that best fits your schedule.    This is a list of the screening recommended for you and due dates:  Health Maintenance  Topic Date Due   DEXA scan (bone density measurement)  Never done   Pneumonia Vaccine (2 of 2 - PCV) 07/04/2021   Flu Shot  11/05/2022   Hemoglobin A1C  02/20/2023   Yearly kidney health urinalysis for diabetes  05/21/2023   Complete foot exam   08/20/2023   Eye exam for diabetics  09/25/2023   Yearly kidney function blood test for diabetes  10/19/2023   Medicare Annual Wellness Visit  11/02/2023   Mammogram  03/24/2024   Colon Cancer Screening  01/14/2028   DTaP/Tdap/Td vaccine (2 - Td or Tdap) 12/26/2029   Hepatitis C Screening  Completed   Zoster (Shingles) Vaccine  Completed   HPV Vaccine  Aged Out   COVID-19 Vaccine  Discontinued    Advanced directives: (ACP Link)Information on Advanced Care Planning can be  found at [facility] of [place] Advance Health Care Directives Advance Health Care Directives ([email or web address]   Next Medicare Annual Wellness Visit scheduled for next year: Yes  Preventive Care 65 Years and Older, Female Preventive care refers to lifestyle choices and visits with your health care provider that can promote health and wellness. What does preventive care include? A yearly physical exam. This is also called an annual well check. Dental exams once or twice a year. Routine eye exams. Ask your health care provider how often you should have your eyes checked. Personal lifestyle choices, including: Daily care of your teeth and gums. Regular physical activity. Eating a healthy diet. Avoiding tobacco and drug use. Limiting alcohol use. Practicing safe sex. Taking low-dose aspirin every day. Taking vitamin and mineral supplements as recommended by your health care provider. What happens during an annual well check? The services and screenings done by your health care provider during your annual well check will depend on your age, overall health, lifestyle risk factors, and family history of disease. Counseling  Your health care provider may ask you questions about your: Alcohol use. Tobacco use. Drug use. Emotional well-being. Home and relationship well-being. Sexual activity. Eating habits. History of falls. Memory and ability to understand (cognition). Work and work Astronomer. Reproductive health. Screening  You may have the following tests or measurements: Height, weight, and BMI. Blood pressure. Lipid and cholesterol levels. These may be checked every 5  years, or more frequently if you are over 20 years old. Skin check. Lung cancer screening. You may have this screening every year starting at age 32 if you have a 30-pack-year history of smoking and currently smoke or have quit within the past 15 years. Fecal occult blood test (FOBT) of the stool. You may  have this test every year starting at age 81. Flexible sigmoidoscopy or colonoscopy. You may have a sigmoidoscopy every 5 years or a colonoscopy every 10 years starting at age 30. Hepatitis C blood test. Hepatitis B blood test. Sexually transmitted disease (STD) testing. Diabetes screening. This is done by checking your blood sugar (glucose) after you have not eaten for a while (fasting). You may have this done every 1-3 years. Bone density scan. This is done to screen for osteoporosis. You may have this done starting at age 34. Mammogram. This may be done every 1-2 years. Talk to your health care provider about how often you should have regular mammograms. Talk with your health care provider about your test results, treatment options, and if necessary, the need for more tests. Vaccines  Your health care provider may recommend certain vaccines, such as: Influenza vaccine. This is recommended every year. Tetanus, diphtheria, and acellular pertussis (Tdap, Td) vaccine. You may need a Td booster every 10 years. Zoster vaccine. You may need this after age 60. Pneumococcal 13-valent conjugate (PCV13) vaccine. One dose is recommended after age 58. Pneumococcal polysaccharide (PPSV23) vaccine. One dose is recommended after age 57. Talk to your health care provider about which screenings and vaccines you need and how often you need them. This information is not intended to replace advice given to you by your health care provider. Make sure you discuss any questions you have with your health care provider. Document Released: 04/19/2015 Document Revised: 12/11/2015 Document Reviewed: 01/22/2015 Elsevier Interactive Patient Education  2017 [employer].  Fall Prevention in the Home Falls can cause injuries. They can happen to people of all ages. There are many things you can do to make your home safe and to help prevent falls. What can I do on the outside of my home? Regularly fix the edges of walkways  and driveways and fix any cracks. Remove anything that might make you trip as you walk through a door, such as a raised step or threshold. Trim any bushes or trees on the path to your home. Use bright outdoor lighting. Clear any walking paths of anything that might make someone trip, such as rocks or tools. Regularly check to see if handrails are loose or broken. Make sure that both sides of any steps have handrails. Any raised decks and porches should have guardrails on the edges. Have any leaves, snow, or ice cleared regularly. Use sand or salt on walking paths during winter. Clean up any spills in your garage right away. This includes oil or grease spills. What can I do in the bathroom? Use night lights. Install grab bars by the toilet and in the tub and shower. Do not use towel bars as grab bars. Use non-skid mats or decals in the tub or shower. If you need to sit down in the shower, use a plastic, non-slip stool. Keep the floor dry. Clean up any water that spills on the floor as soon as it happens. Remove soap buildup in the tub or shower regularly. Attach bath mats securely with double-sided non-slip rug tape. Do not have throw rugs and other things on the floor that can make you  trip. What can I do in the bedroom? Use night lights. Make sure that you have a light by your bed that is easy to reach. Do not use any sheets or blankets that are too big for your bed. They should not hang down onto the floor. Have a firm chair that has side arms. You can use this for support while you get dressed. Do not have throw rugs and other things on the floor that can make you trip. What can I do in the kitchen? Clean up any spills right away. Avoid walking on wet floors. Keep items that you use a lot in easy-to-reach places. If you need to reach something above you, use a strong step stool that has a grab bar. Keep electrical cords out of the way. Do not use floor polish or wax that makes  floors slippery. If you must use wax, use non-skid floor wax. Do not have throw rugs and other things on the floor that can make you trip. What can I do with my stairs? Do not leave any items on the stairs. Make sure that there are handrails on both sides of the stairs and use them. Fix handrails that are broken or loose. Make sure that handrails are as long as the stairways. Check any carpeting to make sure that it is firmly attached to the stairs. Fix any carpet that is loose or worn. Avoid having throw rugs at the top or bottom of the stairs. If you do have throw rugs, attach them to the floor with carpet tape. Make sure that you have a light switch at the top of the stairs and the bottom of the stairs. If you do not have them, ask someone to add them for you. What else can I do to help prevent falls? Wear shoes that: Do not have high heels. Have rubber bottoms. Are comfortable and fit you well. Are closed at the toe. Do not wear sandals. If you use a stepladder: Make sure that it is fully opened. Do not climb a closed stepladder. Make sure that both sides of the stepladder are locked into place. Ask someone to hold it for you, if possible. Clearly mark and make sure that you can see: Any grab bars or handrails. First and last steps. Where the edge of each step is. Use tools that help you move around (mobility aids) if they are needed. These include: Canes. Walkers. Scooters. Crutches. Turn on the lights when you go into a dark area. Replace any light bulbs as soon as they burn out. Set up your furniture so you have a clear path. Avoid moving your furniture around. If any of your floors are uneven, fix them. If there are any pets around you, be aware of where they are. Review your medicines with your doctor. Some medicines can make you feel dizzy. This can increase your chance of falling. Ask your doctor what other things that you can do to help prevent falls. This information is  not intended to replace advice given to you by your health care provider. Make sure you discuss any questions you have with your health care provider. Document Released: 01/17/2009 Document Revised: 08/29/2015 Document Reviewed: 04/27/2014 Elsevier Interactive Patient Education  2017 [employer].

## 2022-11-05 ENCOUNTER — Telehealth: Payer: Self-pay | Admitting: *Deleted

## 2022-11-05 NOTE — Telephone Encounter (Signed)
.    Telephone encounter was:  Successful.  11/05/2022 Name: Marie [name] MRN: [id number] DOB: 1955/06/19  Marie [name] is a 67 y.o. year old female who is a primary care patient of Mliss Sax, MD . The community resource team was consulted for assistance with Transportation Needs   Care guide performed the following interventions: Patient provided with information about care guide support team and interviewed to confirm resource needs.  Follow Up Plan:  no further follow up needed Marie [name] -[facility] [place], Population [facility] [phone] [address] [place] , [place] [address] Email : [name]

## 2022-11-11 ENCOUNTER — Telehealth: Payer: Self-pay | Admitting: Family Medicine

## 2022-11-11 ENCOUNTER — Ambulatory Visit: Payer: Self-pay

## 2022-11-11 NOTE — Telephone Encounter (Signed)
[place] and she want to know what vaccines to take before she go. Please call the pt

## 2022-11-11 NOTE — Patient Outreach (Signed)
  Care Coordination   11/11/2022 Name: MAKENZE BRAMANTE MRN: [id number] DOB: 1955-10-26   Care Coordination Outreach Attempts:  An unsuccessful telephone outreach was attempted today to offer the patient information about available care coordination services.  Follow Up Plan:  Additional outreach attempts will be made to offer the patient care coordination information and services.   Encounter Outcome:  No Answer   Care Coordination Interventions:  No, not indicated     Bary Leriche, RN, MSN [facility] Care Management Care Management Coordinator Direct Line [phone]

## 2022-11-16 ENCOUNTER — Encounter: Payer: Self-pay | Admitting: Family Medicine

## 2022-11-16 ENCOUNTER — Other Ambulatory Visit: Payer: Self-pay

## 2022-11-16 ENCOUNTER — Ambulatory Visit: Payer: Medicare PPO | Admitting: Family Medicine

## 2022-11-16 ENCOUNTER — Telehealth: Payer: Self-pay | Admitting: *Deleted

## 2022-11-16 ENCOUNTER — Telehealth: Payer: Self-pay | Admitting: Family Medicine

## 2022-11-16 VITALS — BP 158/84 | HR 78 | Temp 97.3°F | Ht 61.0 in | Wt 130.6 lb

## 2022-11-16 DIAGNOSIS — N3941 Urge incontinence: Secondary | ICD-10-CM | POA: Diagnosis not present

## 2022-11-16 DIAGNOSIS — R300 Dysuria: Secondary | ICD-10-CM

## 2022-11-16 DIAGNOSIS — I10 Essential (primary) hypertension: Secondary | ICD-10-CM

## 2022-11-16 DIAGNOSIS — T887XXA Unspecified adverse effect of drug or medicament, initial encounter: Secondary | ICD-10-CM

## 2022-11-16 LAB — POCT URINALYSIS DIPSTICK
Bilirubin, UA: NEGATIVE
Blood, UA: NEGATIVE
Glucose, UA: POSITIVE — AB
Ketones, UA: NEGATIVE
Leukocytes, UA: NEGATIVE
Nitrite, UA: NEGATIVE
Protein, UA: NEGATIVE
Spec Grav, UA: 1.02 (ref 1.010–1.025)
Urobilinogen, UA: 0.2 E.U./dL
pH, UA: 6 (ref 5.0–8.0)

## 2022-11-16 NOTE — Telephone Encounter (Signed)
Pt was seen today with Dr Doreene Burke. She is under the impression she should have gotten a script for today's visit. 11/16/22 dealing with holding her urine.  [facility] Address: [address], Phone: [phone]  Pt at [phone]

## 2022-11-16 NOTE — Progress Notes (Signed)
Established Patient Office Visit   Subjective:  Patient ID: Marie [name], female    DOB: [birth year]  Age: 67 y.o. MRN: [id number]  Chief Complaint  Patient presents with   Urinary Frequency    Pt states she is having trouble holding her urine x 5-6 months. Pt requested a referral.     Urinary Frequency  Associated symptoms include frequency and urgency. Pertinent negatives include no flank pain or hematuria.   Encounter Diagnoses  Name Primary?   Dysuria Yes   Urge incontinence    Essential hypertension    For follow-up of above.  Compliant with her blood pressure medication.  She does not smoke or drink alcohol.  Admits that she does add salt to her food.  Not currently checking blood pressure.  For follow-up of recent UTI.  Symptoms have cleared.  She is experiencing difficulty holding her urine after feeling the urge to urinate.   Review of Systems  Constitutional: Negative.   HENT: Negative.    Eyes:  Negative for blurred vision, discharge and redness.  Respiratory: Negative.    Cardiovascular: Negative.   Gastrointestinal:  Negative for abdominal pain.  Genitourinary:  Positive for frequency and urgency. Negative for dysuria, flank pain and hematuria.  Musculoskeletal: Negative.  Negative for myalgias.  Skin:  Negative for rash.  Neurological:  Negative for tingling, loss of consciousness and weakness.  Endo/Heme/Allergies:  Negative for polydipsia.     Current Outpatient Medications:    ascorbic acid (VITAMIN C) 500 MG tablet, Take 500 mg by mouth daily., Disp: , Rfl:    atorvastatin (LIPITOR) 80 MG tablet, TAKE 1 TABLET EVERY DAY, Disp: 90 tablet, Rfl: 3   Cholecalciferol (VITAMIN D3) 3000 units TABS, Take 3,000 Units by mouth at bedtime., Disp: , Rfl:    Continuous Blood Gluc Sensor (FREESTYLE LIBRE 3 SENSOR) MISC, Place 1 sensor on the skin every 14 days. Use to check glucose continuously, Disp: 6 each, Rfl: 3   dapagliflozin propanediol (FARXIGA) 10 MG TABS  tablet, Take 1 tablet (10 mg total) by mouth daily., Disp: 90 tablet, Rfl: 1   escitalopram (LEXAPRO) 10 MG tablet, TAKE 1 TABLET(10 MG) BY MOUTH DAILY, Disp: 90 tablet, Rfl: 1   metFORMIN (GLUCOPHAGE-XR) 500 MG 24 hr tablet, TAKE 1 TABLETS IN THE MORNING AND AT BEDTIME, Disp: 180 tablet, Rfl: 3   Multiple Vitamin (MULTIVITAMIN WITH MINERALS) TABS tablet, Take 1 tablet by mouth daily., Disp: , Rfl:    repaglinide (PRANDIN) 2 MG tablet, TAKE 2 TABLETS BEFORE BREAKFAST, TAKE 1 TABLET AT LUNCH, AND TAKE 1 TABLET BEFORE DINNER, Disp: 360 tablet, Rfl: 3   traZODone (DESYREL) 50 MG tablet, Take 1 tablet (50 mg total) by mouth at bedtime., Disp: 90 tablet, Rfl: 3   valsartan (DIOVAN) 80 MG tablet, Take 1 tablet (80 mg total) by mouth daily., Disp: 90 tablet, Rfl: 3   Objective:     BP (!) 158/84   Pulse 78   Temp (!) 97.3 F (36.3 C)   Ht 5\' 1"  (1.549 m)   Wt 130 lb 9.6 oz (59.2 kg)   SpO2 98%   BMI 24.68 kg/m  BP Readings from Last 3 Encounters:  11/16/22 (!) 158/84  10/09/22 136/76  09/30/22 (!) 157/69   Wt Readings from Last 3 Encounters:  11/16/22 130 lb 9.6 oz (59.2 kg)  10/09/22 131 lb (59.4 kg)  09/30/22 135 lb (61.2 kg)      Physical Exam Constitutional:      General:  She is not in acute distress.    Appearance: Normal appearance. She is not ill-appearing, toxic-appearing or diaphoretic.  HENT:     Head: Normocephalic and atraumatic.     Right Ear: External ear normal.     Left Ear: External ear normal.     Mouth/Throat:     Mouth: Mucous membranes are moist.     Pharynx: Oropharynx is clear. No oropharyngeal exudate or posterior oropharyngeal erythema.  Eyes:     General: No scleral icterus.       Right eye: No discharge.        Left eye: No discharge.     Extraocular Movements: Extraocular movements intact.     Conjunctiva/sclera: Conjunctivae normal.  Cardiovascular:     Rate and Rhythm: Normal rate and regular rhythm.  Pulmonary:     Effort: Pulmonary effort  is normal. No respiratory distress.     Breath sounds: No wheezing, rhonchi or rales.  Abdominal:     Tenderness: There is no right CVA tenderness or left CVA tenderness.  Musculoskeletal:     Cervical back: No rigidity or tenderness.  Lymphadenopathy:     Cervical: No cervical adenopathy.  Skin:    General: Skin is warm and dry.  Neurological:     Mental Status: She is alert and oriented to person, place, and time.  Psychiatric:        Mood and Affect: Mood normal.        Behavior: Behavior normal.      Results for orders placed or performed in visit on 11/16/22  POCT Urinalysis Dipstick  Result Value Ref Range   Color, UA yellow    Clarity, UA clear    Glucose, UA Positive (A) Negative   Bilirubin, UA negative    Ketones, UA negative    Spec Grav, UA 1.020 1.010 - 1.025   Blood, UA negative    pH, UA 6.0 5.0 - 8.0   Protein, UA Negative Negative   Urobilinogen, UA 0.2 0.2 or 1.0 E.U./dL   Nitrite, UA negative    Leukocytes, UA Negative Negative   Appearance     Odor none       The ASCVD Risk score (Arnett DK, et al., 2019) failed to calculate for the following reasons:   The patient has a prior MI or stroke diagnosis    Assessment & Plan:   Dysuria -     POCT urinalysis dipstick  Urge incontinence -     Ambulatory referral to Urology  Essential hypertension    Return in about 3 months (around 02/16/2023), or check and record blood pressures..  Information was given on managing hypertension.  Advised her to decrease the sodium in her diet.  Advised her to check and record her blood pressures at home and follow-up in 3 months.  No evidence for UTI today.  Urology referral for consultation for urge incontinence.  Mliss Sax, MD

## 2022-11-16 NOTE — Telephone Encounter (Signed)
Telephone encounter was:  Successful.  11/16/2022 Name: Marie [name] MRN: [id number] DOB: 05/01/1955  Marie [name] is a 67 y.o. year old female who is a primary care patient of Mliss Sax, MD . The community resource team was consulted for assistance with Transportation Needs   Care guide performed the following interventions: Patient provided with information about care guide support team and interviewed to confirm resource needs.scheduled 2 rides with [employer] transportation  Follow Up Plan:  No further follow up planned at this time. The patient has been provided with needed resources.  Yehuda Mao Greenauer -[facility], Population Health [phone] [address] [place] , [place] [address] Email : [email or web address]       Marie [name] DOB: 01-01-1956 MRN: [id number]   RIDER WAIVER AND RELEASE OF LIABILITY  For purposes of improving physical access to our facilities, [facility] is pleased to partner with third parties to provide [facility] patients or other authorized individuals the option of convenient, on-demand ground transportation services (the [employer]") through use of the technology service that enables users to request on-demand ground transportation from independent third-party providers.  By opting to use and accept these [employer], I, the undersigned, hereby agree on behalf of myself, and on behalf of any minor child using the Science writer for whom I am the parent or legal guardian, as follows:  Science writer provided to me are provided by independent third-party transportation providers who are not [employer] or employees and who are unaffiliated with [employer]. [place] is neither a transportation carrier nor a common or public carrier. [facility] has no control over the quality or safety of the transportation that occurs as a result of the [employer]. [facility]  cannot guarantee that any third-party transportation provider will complete any arranged transportation service. [facility] makes no representation, warranty, or guarantee regarding the reliability, timeliness, quality, safety, suitability, or availability of any of the Transport Services or that they will be error free. I fully understand that traveling by vehicle involves risks and dangers of serious bodily injury, including permanent disability, paralysis, and death. I agree, on behalf of myself and on behalf of any minor child using the Transport Services for whom I am the parent or legal guardian, that the entire risk arising out of my use of the [employer] remains solely with me, to the maximum extent permitted under applicable law. The [employer] are provided "as is" and "as available." [facility] disclaims all representations and warranties, express, implied or statutory, not expressly set out in these terms, including the implied warranties of merchantability and fitness for a particular purpose. I hereby waive and release [facility], its agents, employees, officers, directors, representatives, insurers, attorneys, assigns, successors, subsidiaries, and affiliates from any and all past, present, or future claims, demands, liabilities, actions, causes of action, or suits of any kind directly or indirectly arising from acceptance and use of the [employer]. I further waive and release [facility] and its affiliates from all present and future liability and responsibility for any injury or death to persons or damages to property caused by or related to the use of the [employer]. I have read this Waiver and Release of Liability, and I understand the terms used in it and their legal significance. This Waiver is freely and voluntarily given with the understanding that my right (as well as the right of any minor child for whom  I am the parent or legal guardian using the  [employer]) to legal recourse against [facility] in connection with the [employer] is knowingly surrendered in return for use of these services.   I attest that I read the consent document to Marie [name], gave Ms. Flagler the opportunity to ask questions and answered the questions asked (if any). I affirm that Marie [name] then provided consent for she's participation in this program.     Dione Booze

## 2022-11-16 NOTE — Telephone Encounter (Signed)
Left a voicemail for the patient to give our office a call back.

## 2022-11-16 NOTE — Telephone Encounter (Signed)
11/16/22 - Pt called stating she was seen today and was prescribed a med she was to pick up at the pharmacy. She said her meds hasn't been sent in yet and she doesn't know the name of the med, she needs her meds sent asap to the pharmacy. Also, she said she was referred to [facility] at [facility] point, she wants to be referred to one at [facility] instead.

## 2022-11-17 DIAGNOSIS — H35021 Exudative retinopathy, right eye: Secondary | ICD-10-CM | POA: Diagnosis not present

## 2022-11-17 DIAGNOSIS — H35371 Puckering of macula, right eye: Secondary | ICD-10-CM | POA: Diagnosis not present

## 2022-11-17 DIAGNOSIS — H2512 Age-related nuclear cataract, left eye: Secondary | ICD-10-CM | POA: Diagnosis not present

## 2022-11-17 DIAGNOSIS — E113511 Type 2 diabetes mellitus with proliferative diabetic retinopathy with macular edema, right eye: Secondary | ICD-10-CM | POA: Diagnosis not present

## 2022-11-17 DIAGNOSIS — H26491 Other secondary cataract, right eye: Secondary | ICD-10-CM | POA: Diagnosis not present

## 2022-11-17 DIAGNOSIS — T887XXA Unspecified adverse effect of drug or medicament, initial encounter: Secondary | ICD-10-CM | POA: Insufficient documentation

## 2022-11-17 DIAGNOSIS — E113412 Type 2 diabetes mellitus with severe nonproliferative diabetic retinopathy with macular edema, left eye: Secondary | ICD-10-CM | POA: Diagnosis not present

## 2022-11-18 ENCOUNTER — Other Ambulatory Visit (INDEPENDENT_AMBULATORY_CARE_PROVIDER_SITE_OTHER): Payer: Medicare PPO

## 2022-11-18 ENCOUNTER — Telehealth: Payer: Self-pay

## 2022-11-18 DIAGNOSIS — E1165 Type 2 diabetes mellitus with hyperglycemia: Secondary | ICD-10-CM | POA: Diagnosis not present

## 2022-11-18 DIAGNOSIS — E782 Mixed hyperlipidemia: Secondary | ICD-10-CM

## 2022-11-18 DIAGNOSIS — N3289 Other specified disorders of bladder: Secondary | ICD-10-CM | POA: Diagnosis not present

## 2022-11-18 LAB — URINALYSIS, ROUTINE W REFLEX MICROSCOPIC
Bilirubin Urine: NEGATIVE
Hgb urine dipstick: NEGATIVE
Ketones, ur: NEGATIVE
Leukocytes,Ua: NEGATIVE
Nitrite: NEGATIVE
Specific Gravity, Urine: >=1.03 — AB (ref 1.000–1.030)
Total Protein, Urine: NEGATIVE
Urine Glucose: >=1000 — AB
Urobilinogen, UA: 0.2 (ref 0.0–1.0)
pH: 6 (ref 5.0–8.0)

## 2022-11-18 LAB — BASIC METABOLIC PANEL
BUN: 17 mg/dL (ref 6–23)
CO2: 28 mEq/L (ref 19–32)
Calcium: 8.9 mg/dL (ref 8.4–10.5)
Chloride: 103 mEq/L (ref 96–112)
Creatinine, Ser: 0.87 mg/dL (ref 0.40–1.20)
GFR: 68.43 mL/min (ref >60.00)
Glucose, Bld: 117 mg/dL — ABNORMAL HIGH (ref 70–99)
Potassium: 4 mEq/L (ref 3.5–5.1)
Sodium: 138 mEq/L (ref 135–145)

## 2022-11-18 LAB — HEMOGLOBIN A1C: Hgb A1c MFr Bld: 7.1 % — ABNORMAL HIGH (ref 4.6–6.5)

## 2022-11-18 LAB — LDL CHOLESTEROL, DIRECT: Direct LDL: 92 mg/dL

## 2022-11-18 NOTE — Patient Instructions (Signed)
Visit Information  Thank you for taking time to visit with me today. Please don't hesitate to contact me if I can be of assistance to you.   Following are the goals we discussed today:   Goals Addressed             This Visit's Progress    Diabetes Management       Patient Goals/Self Care Activities: -Patient/Caregiver will self-administer medications as prescribed as evidenced by self-report/primary caregiver report  -Patient/Caregiver will attend all scheduled provider appointments as evidenced by clinician review of documented attendance to scheduled appointments and patient/caregiver report -Patient/Caregiver will call provider office for new concerns or questions as evidenced by review of documented incoming telephone call notes and patient report  -check blood sugar at prescribed times -check blood sugar if I feel it is too high or too low -record values and write them down take them to all doctor visits  -limit carbohydrates and sweets  Patient reports she is doing okay. Patient reports seeing physician recently. Lab work done today. Blood sugar last check was 119.  Encouraged to continue diabetes management.           Our next appointment is by telephone on 12/23/22 at 1000 am  Please call the care guide team at [phone] if you need to cancel or reschedule your appointment.   If you are experiencing a Mental Health or Behavioral Health Crisis or need someone to talk to, please call the Suicide and Crisis Lifeline: 988   Patient verbalizes understanding of instructions and care plan provided today and agrees to view in MyChart. Active MyChart status and patient understanding of how to access instructions and care plan via MyChart confirmed with patient.     The patient has been provided with contact information for the care management team and has been advised to call with any health related questions or concerns.   Bary Leriche, RN, MSN [facility] Care Management Care  Management Coordinator Direct Line [phone]

## 2022-11-18 NOTE — Patient Outreach (Signed)
  Care Coordination   Follow Up Visit Note   11/18/2022 Name: Marie [name] MRN: [id number] DOB: 08-29-1955  Marie [name] is a 67 y.o. year old female who sees Marie Sax, MD for primary care. I spoke with  Marie [name] by phone today.  What matters to the patients health and wellness today?  Diabetes management    Goals Addressed             This Visit's Progress    Diabetes Management       Patient Goals/Self Care Activities: -Patient/Caregiver will self-administer medications as prescribed as evidenced by self-report/primary caregiver report  -Patient/Caregiver will attend all scheduled provider appointments as evidenced by clinician review of documented attendance to scheduled appointments and patient/caregiver report -Patient/Caregiver will call provider office for new concerns or questions as evidenced by review of documented incoming telephone call notes and patient report  -check blood sugar at prescribed times -check blood sugar if I feel it is too high or too low -record values and write them down take them to all doctor visits  -limit carbohydrates and sweets  Patient reports she is doing okay. Patient reports seeing physician recently. Lab work done today. Blood sugar last check was 119.  Encouraged to continue diabetes management.           SDOH assessments and interventions completed:  Yes     Care Coordination Interventions:  Yes, provided   Follow up plan: Follow up call scheduled for September    Encounter Outcome:  Pt. Visit Completed   Marie Leriche, RN, MSN [facility] Care Management Care Management Coordinator Direct Line [phone]

## 2022-11-23 ENCOUNTER — Ambulatory Visit: Payer: Medicare PPO | Admitting: Endocrinology

## 2022-11-23 ENCOUNTER — Telehealth: Payer: Self-pay | Admitting: Family Medicine

## 2022-11-23 ENCOUNTER — Encounter: Payer: Self-pay | Admitting: Endocrinology

## 2022-11-23 VITALS — BP 138/88 | HR 72 | Ht 61.0 in | Wt 131.0 lb

## 2022-11-23 DIAGNOSIS — E1165 Type 2 diabetes mellitus with hyperglycemia: Secondary | ICD-10-CM | POA: Diagnosis not present

## 2022-11-23 DIAGNOSIS — I10 Essential (primary) hypertension: Secondary | ICD-10-CM

## 2022-11-23 DIAGNOSIS — Z7984 Long term (current) use of oral hypoglycemic drugs: Secondary | ICD-10-CM | POA: Diagnosis not present

## 2022-11-23 MED ORDER — DAPAGLIFLOZIN PROPANEDIOL 10 MG PO TABS: 10.0000 mg | ORAL_TABLET | Freq: Every day | ORAL | 1 refills | Status: DC

## 2022-11-23 NOTE — Patient Instructions (Signed)
Take 2 Metformin in am and 1 in pm at dinner

## 2022-11-23 NOTE — Progress Notes (Signed)
Patient ID: Marie [name], female   DOB: [birth year], 67 y.o.   MRN: [id number]           Reason for Appointment: Follow-up for Type 2 Diabetes   History of Present Illness:          Date of diagnosis of type 2 diabetes mellitus: 2010       Background history:   She was likely treated with Metformin alone for the first few years and subsequently given Januvia In 2018 she was also given [place] in addition Current records indicate her highest A1c was 9.6 in 03/2017.  Has been progressively improving since then She was also started on Rybelsus in 02/2019  Recent history:   A1c is 7.1 Fructosamine 246  Non-insulin hypoglycemic drugs the patient is taking are: Metformin 1.0 g daily, also Farxiga 5 mg at night, Prandin 4-2-2 mg before meals    Current management, blood sugar patterns and problems identified  She was told to take her Marcelline Deist in the morning after her last visit and with this her blood sugars during the day are looking better Also still has fairly good readings overnight Time in range has improved to 80 compared to 69 since her last visit She says she is trying to modify her diet and eating less carbohydrate, generally adding avoid take for protein in the morning Also trying to do some walking or other exercise regularly Weight is stable She thinks he can continue to afford [place] for now with taking half a tablet of the 10 mg        Side effects from medications have been: Decreased appetite from Rybelsus   Typical meal intake: Breakfast is egg, toast 1 slice.  Small amount of rice at dinnertime              Glucose monitoring:  Using freestyle libre 3 currently  Data for the last 2 weeks  Interpretation of the [facility] 3 download for the last 14 days as follows  She has more fluctuation of blood sugars during the daytime from day-to-day with periods of hyperglycemia sporadically after all the meals and somewhat inconsistent overnight readings OVERNIGHT blood  sugars are better compared to the last visit and averaging under 150 around midnight and then gradually decreasing until 7 AM without hypoglycemia Premeal blood sugars are mildly increased at lunch and somewhat higher at dinnertime Postprandial readings as follows After breakfast are averaging close to about 180 but with modest variability Blood sugars are sporadically higher after dinner but overall not rising consistently above the target Hypoglycemia present after dinner only once  CGM use % of time   2-week average/GV 147/29  Time in range        80%  % Time Above 180 18  % Time above 250 2  % Time Below 70 <1     PRE-MEAL Fasting Lunch Dinner Bedtime Overall  Glucose range:       Averages: 123       POST-MEAL PC Breakfast PC Lunch PC Dinner  Glucose range:     Averages: 173 142 166   Previously:  CGM use % of time   2-week average/GV 150  Time in range 69%  % Time Above 180 26  % Time above 250 5  % Time Below 70      PRE-MEAL Fasting Lunch Dinner Bedtime Overall  Glucose range:       Averages: 123  140     POST-MEAL PC Breakfast PC Lunch  PC Dinner  Glucose range:     Averages: 204 155 179     Dietician visit, most recent: 11/14  Weight history:   Wt Readings from Last 3 Encounters:  11/23/22 131 lb (59.4 kg)  11/16/22 130 lb 9.6 oz (59.2 kg)  10/09/22 131 lb (59.4 kg)    Glycemic control:   Lab Results  Component Value Date   HGBA1C 7.1 (H) 11/18/2022   HGBA1C 7.0 (A) 08/20/2022   HGBA1C 7.4 (H) 05/20/2022   Lab Results  Component Value Date   MICROALBUR 0.8 05/20/2022   LDLCALC 52 10/19/2022   CREATININE 0.87 11/18/2022   Lab Results  Component Value Date   MICRALBCREAT 1.9 05/20/2022    Lab Results  Component Value Date   FRUCTOSAMINE 246 08/17/2022   FRUCTOSAMINE 262 02/14/2021   FRUCTOSAMINE 260 10/25/2020    Lab on 11/18/2022  Component Date Value Ref Range Status   Direct LDL 11/18/2022 92.0  mg/dL Final   Optimal:  <130  mg/dLNear or Above Optimal:  100-129 mg/dLBorderline High:  130-159 mg/dLHigh:  160-189 mg/dLVery High:  >190 mg/dL   Sodium 86/57/8469 629  135 - 145 mEq/L Final   Potassium 11/18/2022 4.0  3.5 - 5.1 mEq/L Final   Chloride 11/18/2022 103  96 - 112 mEq/L Final   CO2 11/18/2022 28  19 - 32 mEq/L Final   Glucose, Bld 11/18/2022 117 (H)  70 - 99 mg/dL Final   BUN 52/84/1324 17  6 - 23 mg/dL Final   Creatinine, Ser 11/18/2022 0.87  0.40 - 1.20 mg/dL Final   GFR 40/01/2724 68.43  >60.00 mL/min Final   Calculated using the CKD-EPI Creatinine Equation (2021)   Calcium 11/18/2022 8.9  8.4 - 10.5 mg/dL Final   Hgb D6U MFr Bld 11/18/2022 7.1 (H)  4.6 - 6.5 % Final   Glycemic Control Guidelines for People with Diabetes:Non Diabetic:  <6%Goal of Therapy: <7%Additional Action Suggested:  >8%    Color, Urine 11/18/2022 YELLOW  Yellow;Lt. Yellow;Straw;Dark Yellow;Amber;Green;Red;Brown Final   APPearance 11/18/2022 CLEAR  Clear;Turbid;Slightly Cloudy;Cloudy Final   Specific Gravity, Urine 11/18/2022 >=1.030 (A)  1.000 - 1.030 Final   pH 11/18/2022 6.0  5.0 - 8.0 Final   Total Protein, Urine 11/18/2022 NEGATIVE  Negative Final   Urine Glucose 11/18/2022 >=1000 (A)  Negative Final   Ketones, ur 11/18/2022 NEGATIVE  Negative Final   Bilirubin Urine 11/18/2022 NEGATIVE  Negative Final   Hgb urine dipstick 11/18/2022 NEGATIVE  Negative Final   Urobilinogen, UA 11/18/2022 0.2  0.0 - 1.0 Final   Leukocytes,Ua 11/18/2022 NEGATIVE  Negative Final   Nitrite 11/18/2022 NEGATIVE  Negative Final   WBC, UA 11/18/2022 0-2/hpf  0-2/hpf Final   RBC / HPF 11/18/2022 0-2/hpf  0-2/hpf Final   Mucus, UA 11/18/2022 Presence of (A)  None Final   Squamous Epithelial / HPF 11/18/2022 Rare(0-4/hpf)  Rare(0-4/hpf) Final   Renal Epithel, UA 11/18/2022 Rare(0-4/hpf) (A)  None Final   Bacteria, UA 11/18/2022 Few(10-50/hpf) (A)  None Final    Allergies as of 11/23/2022       Reactions   Penicillins Other (See Comments)    Driving couldn't see and head felt heavy  Has patient had a PCN reaction causing immediate rash, facial/tongue/throat swelling, SOB or lightheadedness with hypotension: No Has patient had a PCN reaction causing severe rash involving mucus membranes or skin necrosis: No Has patient had a PCN reaction that required hospitalization: No Has patient had a PCN reaction occurring within the last 10 years:  No If all of the above answers are "NO", then may proceed with Cephalosporin use.        Medication List        Accurate as of November 23, 2022  1:22 PM. If you have any questions, ask your nurse or doctor.          ascorbic acid 500 MG tablet Commonly known as: VITAMIN C Take 500 mg by mouth daily.   atorvastatin 80 MG tablet Commonly known as: LIPITOR TAKE 1 TABLET EVERY DAY   dapagliflozin propanediol 10 MG Tabs tablet Commonly known as: Farxiga Take 1 tablet (10 mg total) by mouth daily.   escitalopram 10 MG tablet Commonly known as: LEXAPRO TAKE 1 TABLET(10 MG) BY MOUTH DAILY   FreeStyle Libre 3 Sensor Misc Place 1 sensor on the skin every 14 days. Use to check glucose continuously   metFORMIN 500 MG 24 hr tablet Commonly known as: GLUCOPHAGE-XR TAKE 1 TABLETS IN THE MORNING AND AT BEDTIME   multivitamin with minerals Tabs tablet Take 1 tablet by mouth daily.   repaglinide 2 MG tablet Commonly known as: PRANDIN TAKE 2 TABLETS BEFORE BREAKFAST, TAKE 1 TABLET AT LUNCH, AND TAKE 1 TABLET BEFORE DINNER   traZODone 50 MG tablet Commonly known as: DESYREL Take 1 tablet (50 mg total) by mouth at bedtime.   valsartan 80 MG tablet Commonly known as: Diovan Take 1 tablet (80 mg total) by mouth daily.   Vitamin D3 75 MCG (3000 UT) Tabs Take 3,000 Units by mouth at bedtime.        Allergies:  Allergies  Allergen Reactions   Penicillins Other (See Comments)    Driving couldn't see and head felt heavy  Has patient had a PCN reaction causing immediate rash,  facial/tongue/throat swelling, SOB or lightheadedness with hypotension: No Has patient had a PCN reaction causing severe rash involving mucus membranes or skin necrosis: No Has patient had a PCN reaction that required hospitalization: No Has patient had a PCN reaction occurring within the last 10 years: No If all of the above answers are "NO", then may proceed with Cephalosporin use.     Past Medical History:  Diagnosis Date   CVA (cerebral vascular accident) (HCC) 2015   Depression    Diabetes (HCC)    t2   Gallstones    Hyperlipemia    Hypertension    Migraines     Past Surgical History:  Procedure Laterality Date   BREAST BIOPSY Right 05/2019   CESAREAN SECTION     x2   CHOLECYSTECTOMY N/A 05/25/2016   Procedure: LAPAROSCOPIC CHOLECYSTECTOMY WITH INTRAOPERATIVE CHOLANGIOGRAM;  Surgeon: Luretha Murphy, MD;  Location: [facility] ORS;  Service: General;  Laterality: N/A;   DENTAL SURGERY Left 09/09/2022   IR GENERIC HISTORICAL  03/13/2016   IR PERC CHOLECYSTOSTOMY 03/13/2016 [name], MD [facility]INTER[facility] RAD   IR GENERIC HISTORICAL  04/28/2016   IR RADIOLOGIST EVAL & MGMT 04/28/2016 [name], MD [facility] INTER[facility] RAD   LOOP RECORDER IMPLANT N/A 11/29/2013   Procedure: LOOP RECORDER IMPLANT;  Surgeon: Marinus Maw, MD;  Location: [facility] CATH LAB;  Service: Cardiovascular;  Laterality: N/A;   TEE WITHOUT CARDIOVERSION N/A 08/24/2013   Procedure: TRANSESOPHAGEAL ECHOCARDIOGRAM (TEE);  Surgeon: [name], MD;  Location: [facility] ENDOSCOPY;  Service: Cardiovascular;  Laterality: N/A;    Family History  Problem Relation Age of Onset   Stomach cancer Mother    High blood pressure Mother    Diabetes Mother    Diabetes Maternal  Grandfather    High blood pressure Maternal Grandfather    Heart disease Neg Hx     Social History:  reports that she has never smoked. She has never used smokeless tobacco. She reports that she does not drink alcohol and does not use drugs.   Review of Systems     Lipid history: LDL controlled with atorvastatin 80 mg. Followed by PCP   Lab Results  Component Value Date   CHOL 112 10/19/2022   HDL 45.10 10/19/2022   LDLCALC 52 10/19/2022   LDLDIRECT 92.0 11/18/2022   TRIG 71.0 10/19/2022   CHOLHDL 2 10/19/2022           Hypertension: Has been present and treated with valsartan 80 mg, previously on lisinopril Blood pressure readings appear to be higher than before  Followed by PCP  BP Readings from Last 3 Encounters:  11/23/22 138/88  11/16/22 (!) 158/84  10/09/22 136/76    Most recent eye exam was in 8/23 with Dr. Luciana Axe.  Has done proliferative retinopathy  Most recent foot exam: 5/24  Currently known complications of diabetes: Retinopathy with decreased vision on the right, regularly followed by retinal surgery  Normal microalbumin in 2024  History of CVA with left facial numbness and speech difficulty  LABS:  Lab on 11/18/2022  Component Date Value Ref Range Status   Direct LDL 11/18/2022 92.0  mg/dL Final   Optimal:  <629 mg/dLNear or Above Optimal:  100-129 mg/dLBorderline High:  130-159 mg/dLHigh:  160-189 mg/dLVery High:  >190 mg/dL   Sodium 52/84/1324 401  135 - 145 mEq/L Final   Potassium 11/18/2022 4.0  3.5 - 5.1 mEq/L Final   Chloride 11/18/2022 103  96 - 112 mEq/L Final   CO2 11/18/2022 28  19 - 32 mEq/L Final   Glucose, Bld 11/18/2022 117 (H)  70 - 99 mg/dL Final   BUN 02/72/5366 17  6 - 23 mg/dL Final   Creatinine, Ser 11/18/2022 0.87  0.40 - 1.20 mg/dL Final   GFR 44/06/4740 68.43  >60.00 mL/min Final   Calculated using the CKD-EPI Creatinine Equation (2021)   Calcium 11/18/2022 8.9  8.4 - 10.5 mg/dL Final   Hgb V9D MFr Bld 11/18/2022 7.1 (H)  4.6 - 6.5 % Final   Glycemic Control Guidelines for People with Diabetes:Non Diabetic:  <6%Goal of Therapy: <7%Additional Action Suggested:  >8%    Color, Urine 11/18/2022 YELLOW  Yellow;Lt. Yellow;Straw;Dark Yellow;Amber;Green;Red;Brown Final   APPearance 11/18/2022  CLEAR  Clear;Turbid;Slightly Cloudy;Cloudy Final   Specific Gravity, Urine 11/18/2022 >=1.030 (A)  1.000 - 1.030 Final   pH 11/18/2022 6.0  5.0 - 8.0 Final   Total Protein, Urine 11/18/2022 NEGATIVE  Negative Final   Urine Glucose 11/18/2022 >=1000 (A)  Negative Final   Ketones, ur 11/18/2022 NEGATIVE  Negative Final   Bilirubin Urine 11/18/2022 NEGATIVE  Negative Final   Hgb urine dipstick 11/18/2022 NEGATIVE  Negative Final   Urobilinogen, UA 11/18/2022 0.2  0.0 - 1.0 Final   Leukocytes,Ua 11/18/2022 NEGATIVE  Negative Final   Nitrite 11/18/2022 NEGATIVE  Negative Final   WBC, UA 11/18/2022 0-2/hpf  0-2/hpf Final   RBC / HPF 11/18/2022 0-2/hpf  0-2/hpf Final   Mucus, UA 11/18/2022 Presence of (A)  None Final   Squamous Epithelial / HPF 11/18/2022 Rare(0-4/hpf)  Rare(0-4/hpf) Final   Renal Epithel, UA 11/18/2022 Rare(0-4/hpf) (A)  None Final   Bacteria, UA 11/18/2022 Few(10-50/hpf) (A)  None Final    Physical Examination:  BP 138/88 (BP Location: Left Arm, Patient  Position: Sitting, Cuff Size: Small)   Pulse 72   Ht 5\' 1"  (1.549 m)   Wt 131 lb (59.4 kg)   SpO2 96%   BMI 24.75 kg/m   No ankle edema     ASSESSMENT:  Diabetes type 2 nonobese  See history of present illness for detailed discussion of current diabetes management, blood sugar patterns and problems identified  A1c is stable at 7.1  Previously fructosamine fairly good at 246  Current treatment regimen is Metformin 1500 mg, Farxiga 5 and Prandin 2-4 mg, 3 times daily  She is doing fairly well with balanced meals Also consistently doing some exercise As before her difficulties with getting postprandial readings on target even with eating small meals  Hypertension: Blood pressure is not well-controlled and likely from taking only a small dose of valsartan  PLAN:    No change in diabetes medications May consider increasing the dose of Farxiga to improve postprandial readings but this may not be affordable  for her She will try taking both tablets of extended release metformin at breakfast time instead of 1 morning and 1 at night Continue regular exercise May need to add more protein at all meals  Follow-up with PCP regarding blood pressure management but also start checking it regularly at home   There are no Patient Instructions on file for this visit.      Reather Littler 11/23/2022, 1:22 PM   Note: This office note was prepared with Dragon voice recognition system technology. Any transcriptional errors that result from this process are unintentional.

## 2022-11-23 NOTE — Telephone Encounter (Signed)
LVMTCB if any further questions concerning her charges of $39 from our office.

## 2022-11-23 NOTE — Telephone Encounter (Signed)
error 

## 2022-11-24 ENCOUNTER — Encounter: Payer: Self-pay | Admitting: Endocrinology

## 2022-11-27 ENCOUNTER — Encounter: Payer: Self-pay | Admitting: Family Medicine

## 2022-11-27 ENCOUNTER — Ambulatory Visit: Payer: Medicare PPO | Admitting: Family Medicine

## 2022-11-27 ENCOUNTER — Telehealth: Payer: Self-pay

## 2022-11-27 VITALS — BP 138/88 | HR 81 | Temp 98.2°F | Ht 61.0 in | Wt 129.6 lb

## 2022-11-27 DIAGNOSIS — I10 Essential (primary) hypertension: Secondary | ICD-10-CM

## 2022-11-27 MED ORDER — VALSARTAN 80 MG PO TABS
80.0000 mg | ORAL_TABLET | Freq: Two times a day (BID) | ORAL | 1 refills | Status: DC
Start: 2022-11-27 — End: 2023-03-09

## 2022-11-27 NOTE — Patient Instructions (Signed)
Visit Information  Thank you for taking time to visit with me today. Please don't hesitate to contact me if I can be of assistance to you.   Following are the goals we discussed today:   Goals Addressed             This Visit's Progress    Diabetes Management       Patient Goals/Self Care Activities: -Patient/Caregiver will self-administer medications as prescribed as evidenced by self-report/primary caregiver report  -Patient/Caregiver will attend all scheduled provider appointments as evidenced by clinician review of documented attendance to scheduled appointments and patient/caregiver report -Patient/Caregiver will call provider office for new concerns or questions as evidenced by review of documented incoming telephone call notes and patient report  -check blood sugar at prescribed times -check blood sugar if I feel it is too high or too low -record values and write them down take them to all doctor visits  -limit carbohydrates and sweets  Patient seen in person at provider office.  Patient doing okay. Managing health and disabled son's health as well. Diabetes management continues.         Our next appointment is by telephone on 12/23/22 at 1000 am  Please call the care guide team at [phone] if you need to cancel or reschedule your appointment.   If you are experiencing a Mental Health or Behavioral Health Crisis or need someone to talk to, please call the Suicide and Crisis Lifeline: 988   Patient verbalizes understanding of instructions and care plan provided today and agrees to view in MyChart. Active MyChart status and patient understanding of how to access instructions and care plan via MyChart confirmed with patient.     The patient has been provided with contact information for the care management team and has been advised to call with any health related questions or concerns.   Bary Leriche, RN, MSN [facility], [facility] Management Community Coordinator Direct Dial: [phone]  Fax: [phone] Website: [email or web address]

## 2022-11-27 NOTE — Patient Outreach (Signed)
  Care Coordination   In Person Provider Office Visit Note   11/27/2022 Name: Marie [name] MRN: [id number] DOB: [birth year]  Marie [name] is a 67 y.o. year old female who sees Mliss Sax, MD for primary care. I engaged with Marie [name] in the providers office today.  What matters to the patients health and wellness today?  Managing health    Goals Addressed             This Visit's Progress    Diabetes Management       Patient Goals/Self Care Activities: -Patient/Caregiver will self-administer medications as prescribed as evidenced by self-report/primary caregiver report  -Patient/Caregiver will attend all scheduled provider appointments as evidenced by clinician review of documented attendance to scheduled appointments and patient/caregiver report -Patient/Caregiver will call provider office for new concerns or questions as evidenced by review of documented incoming telephone call notes and patient report  -check blood sugar at prescribed times -check blood sugar if I feel it is too high or too low -record values and write them down take them to all doctor visits  -limit carbohydrates and sweets  Patient seen in person at provider office.  Patient doing okay. Managing health and disabled son's health as well. Diabetes management continues.         SDOH assessments and interventions completed:  Yes     Care Coordination Interventions:  Yes, provided   Follow up plan: Follow up call scheduled for September    Encounter Outcome:  Pt. Visit Completed   Bary Leriche, RN, MSN [facility]  [facility], [facility] Management Community Coordinator Direct Dial: [phone]  Fax: [phone] Website: [email or web address]

## 2022-11-27 NOTE — Progress Notes (Signed)
Established Patient Office Visit   Subjective:  Patient ID: Marie [name], female    DOB: 15-Jul-1955  Age: 67 y.o. MRN: [id number]  Chief Complaint  Patient presents with   Hypertension    Pt in office with complaints of elevated BP. Pt states her endocrinologist sent her here to discuss blood pressure meds.     Hypertension Pertinent negatives include no blurred vision or headaches.   Encounter Diagnoses  Name Primary?   Essential hypertension Yes   For follow-up of hypertension.  Blood pressure has been elevated into the upper 130s over upper 80s range for several visits.  Patient is compliant with her medication which is valsartan 80 mg daily.  She is taking nothing else for her blood pressure.  She rarely consumes packaged foods.  She does her own cooking and is mindful of sodium intake.  She does not drink alcohol or smoke.  She sometimes feels lightheaded though when she stands up.  She has no chest pain shortness of breath or headaches.  She is visually impaired.   Review of Systems  Constitutional: Negative.   HENT: Negative.    Eyes:  Negative for blurred vision, discharge and redness.  Respiratory: Negative.    Cardiovascular: Negative.   Gastrointestinal:  Negative for abdominal pain.  Genitourinary: Negative.   Musculoskeletal: Negative.  Negative for myalgias.  Skin:  Negative for rash.  Neurological:  Negative for dizziness, tingling, loss of consciousness, weakness and headaches.  Endo/Heme/Allergies:  Negative for polydipsia.     Current Outpatient Medications:    ascorbic acid (VITAMIN C) 500 MG tablet, Take 500 mg by mouth daily., Disp: , Rfl:    atorvastatin (LIPITOR) 80 MG tablet, TAKE 1 TABLET EVERY DAY, Disp: 90 tablet, Rfl: 3   Cholecalciferol (VITAMIN D3) 3000 units TABS, Take 3,000 Units by mouth at bedtime., Disp: , Rfl:    Continuous Blood Gluc Sensor (FREESTYLE LIBRE 3 SENSOR) MISC, Place 1 sensor on the skin every 14 days. Use to check glucose  continuously, Disp: 6 each, Rfl: 3   dapagliflozin propanediol (FARXIGA) 10 MG TABS tablet, Take 1 tablet (10 mg total) by mouth daily., Disp: 90 tablet, Rfl: 1   escitalopram (LEXAPRO) 10 MG tablet, TAKE 1 TABLET(10 MG) BY MOUTH DAILY, Disp: 90 tablet, Rfl: 1   metFORMIN (GLUCOPHAGE-XR) 500 MG 24 hr tablet, TAKE 1 TABLETS IN THE MORNING AND AT BEDTIME, Disp: 180 tablet, Rfl: 3   Multiple Vitamin (MULTIVITAMIN WITH MINERALS) TABS tablet, Take 1 tablet by mouth daily., Disp: , Rfl:    repaglinide (PRANDIN) 2 MG tablet, TAKE 2 TABLETS BEFORE BREAKFAST, TAKE 1 TABLET AT LUNCH, AND TAKE 1 TABLET BEFORE DINNER, Disp: 360 tablet, Rfl: 3   traZODone (DESYREL) 50 MG tablet, Take 1 tablet (50 mg total) by mouth at bedtime., Disp: 90 tablet, Rfl: 3   valsartan (DIOVAN) 80 MG tablet, Take 1 tablet (80 mg total) by mouth 2 (two) times daily., Disp: 180 tablet, Rfl: 1   Objective:     BP 138/88   Pulse 81   Temp 98.2 F (36.8 C)   Ht 5\' 1"  (1.549 m)   Wt 129 lb 9.6 oz (58.8 kg)   SpO2 99%   BMI 24.49 kg/m  BP Readings from Last 3 Encounters:  11/27/22 138/88  11/23/22 138/88  11/16/22 (!) 158/84   Wt Readings from Last 3 Encounters:  11/27/22 129 lb 9.6 oz (58.8 kg)  11/23/22 131 lb (59.4 kg)  11/16/22 130 lb 9.6 oz (  59.2 kg)      Physical Exam Constitutional:      General: She is not in acute distress.    Appearance: Normal appearance. She is not ill-appearing, toxic-appearing or diaphoretic.  HENT:     Head: Normocephalic and atraumatic.     Right Ear: External ear normal.     Left Ear: External ear normal.     Mouth/Throat:     Mouth: Mucous membranes are moist.     Pharynx: Oropharynx is clear. No oropharyngeal exudate or posterior oropharyngeal erythema.  Eyes:     General: No scleral icterus.       Right eye: No discharge.        Left eye: No discharge.     Extraocular Movements: Extraocular movements intact.     Conjunctiva/sclera: Conjunctivae normal.  Cardiovascular:      Rate and Rhythm: Normal rate and regular rhythm.  Pulmonary:     Effort: Pulmonary effort is normal. No respiratory distress.     Breath sounds: Normal breath sounds.  Abdominal:     General: Bowel sounds are normal.  Musculoskeletal:     Cervical back: No rigidity or tenderness.     Right lower leg: No edema.     Left lower leg: No edema.  Lymphadenopathy:     Cervical: No cervical adenopathy.  Skin:    General: Skin is warm and dry.  Neurological:     Mental Status: She is alert and oriented to person, place, and time.  Psychiatric:        Mood and Affect: Mood normal.        Behavior: Behavior normal.      No results found for any visits on 11/27/22.    The ASCVD Risk score (Arnett DK, et al., 2019) failed to calculate for the following reasons:   The patient has a prior MI or stroke diagnosis    Assessment & Plan:   Essential hypertension -     Valsartan; Take 1 tablet (80 mg total) by mouth 2 (two) times daily.  Dispense: 180 tablet; Refill: 1    Return in about 8 weeks (around 01/22/2023), or Have RSV through your pharmacy.  Have increased valsartan to twice daily.  Will be mindful about sodium intake.  Advised patient to have the RSV vaccine through her pharmacy.  Information was given.  Like to see her LDL a little lower and we will address that at next visit.   Mliss Sax, MD

## 2022-12-22 ENCOUNTER — Telehealth: Payer: Self-pay | Admitting: *Deleted

## 2022-12-22 NOTE — Telephone Encounter (Signed)
Telephone encounter was:  Successful.  12/22/2022 Name: Marie [name] MRN: [id number] DOB: [birth year]  Marie [name] is a 67 y.o. year old female who is a primary care patient of Mliss Sax, MD . The community resource team was consulted for assistance with Transportation Needs  Scheduled ride for 12/29/2022 , with [employer] ,has a will call pick up to [facility] .  Care guide performed the following interventions: Patient provided with information about care guide support team and interviewed to confirm resource needs.  Follow Up Plan:  NO FURTHER FOLLOW UP  [facility]  Population Health Careguide  Direct Dial: [phone] Website: [email or web address]        Marie [name] DOB: [birth year] MRN: [id number]   RIDER WAIVER AND RELEASE OF LIABILITY  For purposes of improving physical access to our facilities, [facility] is pleased to partner with third parties to provide [facility] patients or other authorized individuals the option of convenient, on-demand ground transportation services (the [employer]") through use of the technology service that enables users to request on-demand ground transportation from independent third-party providers.  By opting to use and accept these [employer], I, the undersigned, hereby agree on behalf of myself, and on behalf of any minor child using the Science writer for whom I am the parent or legal guardian, as follows:  Science writer provided to me are provided by independent third-party transportation providers who are not [employer] or employees and who are unaffiliated with [employer]. [facility] is neither a transportation carrier nor a common or public carrier. [facility] has no control over the quality or safety of the transportation that occurs as a result of the [employer]. [facility] cannot guarantee that any third-party transportation provider will complete any arranged  transportation service. [facility] makes no representation, warranty, or guarantee regarding the reliability, timeliness, quality, safety, suitability, or availability of any of the Transport Services or that they will be error free. I fully understand that traveling by vehicle involves risks and dangers of serious bodily injury, including permanent disability, paralysis, and death. I agree, on behalf of myself and on behalf of any minor child using the Transport Services for whom I am the parent or legal guardian, that the entire risk arising out of my use of the [employer] remains solely with me, to the maximum extent permitted under applicable law. The [employer] are provided "as is" and "as available." [facility] disclaims all representations and warranties, express, implied or statutory, not expressly set out in these terms, including the implied warranties of merchantability and fitness for a particular purpose. I hereby waive and release [place], its agents, employees, officers, directors, representatives, insurers, attorneys, assigns, successors, subsidiaries, and affiliates from any and all past, present, or future claims, demands, liabilities, actions, causes of action, or suits of any kind directly or indirectly arising from acceptance and use of the [employer]. I further waive and release  and its affiliates from all present and future liability and responsibility for any injury or death to persons or damages to property caused by or related to the use of the [employer]. I have read this Waiver and Release of Liability, and I understand the terms used in it and their legal significance. This Waiver is freely and voluntarily given with the understanding that my right (as well as the right of any minor child for whom I am the parent or legal guardian using  the [employer]) to legal recourse against [facility] in connection with the [employer] is knowingly surrendered in return for use of these services.   I attest that I read the consent document to Marie [name], gave Ms. Kallin the opportunity to ask questions and answered the questions asked (if any). I affirm that Marie [name] then provided consent for she's participation in this program.     Marie [name]

## 2022-12-23 ENCOUNTER — Telehealth: Payer: Self-pay | Admitting: *Deleted

## 2022-12-23 NOTE — Progress Notes (Signed)
Care Coordination Note  12/23/2022 Name: TASHIKA LOSI MRN: [id number] DOB: [birth year]  NIMSI FILLEY is a 67 y.o. year old female who is a primary care patient of Mliss Sax, MD and is actively engaged with the care management team. I reached out to [name] by phone today to assist with re-scheduling a follow up visit with the RN Case Manager  Follow up plan: Unsuccessful telephone outreach attempt made.   [facility]  Care Coordination Care Guide  Direct Dial: [phone]

## 2022-12-24 ENCOUNTER — Telehealth: Payer: Self-pay

## 2022-12-24 NOTE — Patient Outreach (Signed)
Care Coordination   12/24/2022 Name: Marie [name] MRN: [id number] DOB: 1956-04-04   Care Coordination Outreach Attempts:  An unsuccessful telephone outreach was attempted today to offer the patient information about available care coordination services.  Follow Up Plan:  Additional outreach attempts will be made to offer the patient care coordination information and services.   Encounter Outcome:  No Answer   Care Coordination Interventions:  No, not indicated    Bary Leriche, RN, MSN [facility]  [facility], [facility] Management Community Coordinator Direct Dial: [phone]  Fax: [phone] Website: [email or web address]

## 2022-12-25 ENCOUNTER — Telehealth: Payer: Self-pay

## 2022-12-25 NOTE — Patient Instructions (Signed)
Visit Information  Thank you for taking time to visit with me today. Please don't hesitate to contact me if I can be of assistance to you.   Following are the goals we discussed today:   Goals Addressed             This Visit's Progress    Diabetes Management       Patient Goals/Self Care Activities: -Patient/Caregiver will self-administer medications as prescribed as evidenced by self-report/primary caregiver report  -Patient/Caregiver will attend all scheduled provider appointments as evidenced by clinician review of documented attendance to scheduled appointments and patient/caregiver report -Patient/Caregiver will call provider office for new concerns or questions as evidenced by review of documented incoming telephone call notes and patient report  -check blood sugar at prescribed times -check blood sugar if I feel it is too high or too low -record values and write them down take them to all doctor visits  -limit carbohydrates and sweets   Spoke with patient. She reports she is doing okay.  She reports that [facility] does not have her [name] supplies as they are in a shortage. She has one more that will last for 14 days.  She will call back to check status. Advised patient to cal endocrinologist to see if they has samples. She states she would.  Also advised she could check her sugar with her old machine by sticking her finger. She verbalized understanding. Last sugar check was 116. Discussed diabetes management.          Our next appointment is by telephone on 01/20/23 at 1030 am  Please call the care guide team at [phone] if you need to cancel or reschedule your appointment.   If you are experiencing a Mental Health or Behavioral Health Crisis or need someone to talk to, please call the Suicide and Crisis Lifeline: 988   Patient verbalizes understanding of instructions and care plan provided today and agrees to view in MyChart. Active MyChart status and patient  understanding of how to access instructions and care plan via MyChart confirmed with patient.     The patient has been provided with contact information for the care management team and has been advised to call with any health related questions or concerns.   Bary Leriche, RN, MSN [facility], [facility] Management Community Coordinator Direct Dial: [phone]  Fax: [phone] Website: [email or web address]

## 2022-12-25 NOTE — Patient Outreach (Signed)
Care Coordination   Follow Up Visit Note   12/25/2022 Name: Marie [name] MRN: [id number] DOB: [birth year]  Marie [name] is a 67 y.o. year old female who sees Mliss Sax, MD for primary care. I spoke with  Marie [name] by phone today.  What matters to the patients health and wellness today?  Maintain diabetes    Goals Addressed             This Visit's Progress    Diabetes Management       Patient Goals/Self Care Activities: -Patient/Caregiver will self-administer medications as prescribed as evidenced by self-report/primary caregiver report  -Patient/Caregiver will attend all scheduled provider appointments as evidenced by clinician review of documented attendance to scheduled appointments and patient/caregiver report -Patient/Caregiver will call provider office for new concerns or questions as evidenced by review of documented incoming telephone call notes and patient report  -check blood sugar at prescribed times -check blood sugar if I feel it is too high or too low -record values and write them down take them to all doctor visits  -limit carbohydrates and sweets   Spoke with patient. She reports she is doing okay.  She reports that Marie [name] does not have her Marie [name] supplies as they are in a shortage. She has one more that will last for 14 days.  She will call back to check status. Advised patient to cal endocrinologist to see if they has samples. She states she would.  Also advised she could check her sugar with her old machine by sticking her finger. She verbalized understanding. Last sugar check was 116. Discussed diabetes management.          SDOH assessments and interventions completed:  Yes     Care Coordination Interventions:  Yes, provided   Follow up plan: Follow up call scheduled for October    Encounter Outcome:  Patient Visit Completed   Bary Leriche, RN, MSN [facility]  [facility], [facility] Management  Community Coordinator Direct Dial: [phone]  Fax: [phone] Website: [email or web address]

## 2022-12-29 ENCOUNTER — Ambulatory Visit: Payer: Medicare PPO | Admitting: Dermatology

## 2022-12-29 ENCOUNTER — Encounter: Payer: Self-pay | Admitting: Dermatology

## 2022-12-29 VITALS — BP 148/96 | HR 73

## 2022-12-29 DIAGNOSIS — L650 Telogen effluvium: Secondary | ICD-10-CM | POA: Diagnosis not present

## 2022-12-29 DIAGNOSIS — Z79899 Other long term (current) drug therapy: Secondary | ICD-10-CM

## 2022-12-29 DIAGNOSIS — L649 Androgenic alopecia, unspecified: Secondary | ICD-10-CM | POA: Diagnosis not present

## 2022-12-29 MED ORDER — SAFETY SEAL MISCELLANEOUS MISC: 1.0000 | Freq: Every day | 4 refills | Status: AC

## 2022-12-29 NOTE — Progress Notes (Unsigned)
New Patient Visit   Subjective  Marie [name] is a 67 y.o. female who presents for the following: Hair loss  Patient states she has hair thinning located at the scalp that she would like to have examined. Patient reports the areas have been there for 7 months. She reports the areas are not bothersome.Patient rates irritation 0 out of 10. She states that the areas have not spread. Patient reports she has not previously been treated for these areas. Patient denies Hx of bx. Patient denies family history of skin cancer(s).  The following portions of the chart were reviewed this encounter and updated as appropriate: medications, allergies, medical history  Review of Systems:  No other skin or systemic complaints except as noted in HPI or Assessment and Plan.  Objective  Well appearing patient in no apparent distress; mood and affect are within normal limits.  A focused examination was performed of the following areas: Scalp  Relevant exam findings are noted in the Assessment and Plan.            Assessment & Plan   TELOGEN EFFLUVIUM Exam: Diffuse thinning of hair, positive hair pull test.  Telogen effluvium is a benign, self-limited condition causing increased hair shedding usually for several months. It does not progress to baldness, and the hair eventually grows back on its own. It can be triggered by recent illness, recent surgery, thyroid disease, low iron stores, vitamin D deficiency, fad diets or rapid weight loss, hormonal changes such as pregnancy or birth control pills, and some medication. Usually the hair loss starts 2-3 months after the illness or health change. Rarely, it can continue for longer than a year. Treatments options may include oral or topical Minoxidil; Red Light scalp treatments; Biotin 2.5 mg daily and other options.  Treatment Plan: - We will recommend Vivascal Hair supplement to take 2 tablets daily - We also recommend Vital Protein Powder to drink  daily - We will send in AA gel to [facility] to apply in the morning  ANDROGENETIC ALOPECIA (FEMALE PATTERN HAIR LOSS) Exam: Diffuse thinning of the crown and widening of the midline part with retention of the frontal hairline  Flared  Female Androgenic Alopecia is a chronic condition related to genetics and/or hormonal changes.  In women androgenetic alopecia is commonly associated with menopause but may occur any time after puberty.  It causes hair thinning primarily on the crown with widening of the part and temporal hairline recession.  Can use OTC Rogaine (minoxidil) 5% solution/foam as directed.  Oral treatments in female patients who have no contraindication may include: - Low dose oral minoxidil 1.25 - 5mg  daily - Spironolactone 50 - 100mg  bid - Finasteride 2.5 - 5 mg daily Adjunctive therapies include: - Low Level Laser Light Therapy (LLLT) - Platelet-rich plasma injections (PRP) - Hair Transplants or scalp reduction   Treatment Plan: - We will recommend Vivascal Hair supplement to take 2 tablets daily - We also recommend Vital Protein Powder to drink daily - We will send in AA gel to [facility] to apply in the morning  Long term medication management.  Patient is using long term (months to years) prescription medication  to control their dermatologic condition.  These medications require periodic monitoring to evaluate for efficacy and side effects and may require periodic laboratory monitoring.     No follow-ups on file.   Documentation: I have reviewed the above documentation for accuracy and completeness, and I agree with the above.  I,  Nell Range, am acting as scribe for Langston Reusing, DO.   Langston Reusing, DO

## 2022-12-29 NOTE — Patient Instructions (Addendum)
Marie, Marie [name],  Thank you for visiting [place] today. We appreciate your commitment to addressing your hair thinning concerns. Here is a summary of the key instructions and recommendations from today's consultation:  - Medication: Start using Minoxidil 7% with clobetasol, to be compounded at [facility]. You will be contacted by them to arrange payment and delivery.  - Supplements:   - Viviscal Vitamins: Take two tablets daily.   - Vital Proteins Collagen Powder: Add one to two scoops daily to a smoothie, coffee, or orange juice.  - Follow-Up Appointment: We have scheduled a follow-up for four months from now to assess hair regrowth and reduction in shedding.  - Expected Outcomes: It may take three to four months to see significant hair regrowth. At your next visit, we anticipate seeing new baby hairs and a reduction in shedding.  Please adhere to the treatment plan as discussed, and do not hesitate to contact our office if you have any questions or concerns before your next visit.  Warm regards,  Dr. Langston Reusing, Dermatology            Important Information   Due to recent changes in healthcare laws, you may see results of your pathology and/or laboratory studies on MyChart before the doctors have had a chance to review them. We understand that in some cases there may be results that are confusing or concerning to you. Please understand that not all results are received at the same time and often the doctors may need to interpret multiple results in order to provide you with the best plan of care or course of treatment. Therefore, we ask that you please give [place] 2 business days to thoroughly review all your results before contacting the office for clarification. Should we see a critical lab result, you will be contacted sooner.     If You Need Anything After Your Visit   If you have any questions or concerns for your doctor, please call our main line at [phone]. If no  one answers, please leave a voicemail as directed and we will return your call as soon as possible. Messages left after 4 pm will be answered the following business day.    You may also send [place] a message via MyChart. We typically respond to MyChart messages within 1-2 business days.  For prescription refills, please ask your pharmacy to contact our office. Our fax number is [phone].  If you have an urgent issue when the clinic is closed that cannot wait until the next business day, you can page your doctor at the number below.     Please note that while we do our best to be available for urgent issues outside of office hours, we are not available 24/7.    If you have an urgent issue and are unable to reach [place], you may choose to seek medical care at your doctor's office, retail clinic, urgent care center, or emergency room.   If you have a medical emergency, please immediately call 911 or go to the emergency department. In the event of inclement weather, please call our main line at [phone] for an update on the status of any delays or closures.  Dermatology Medication Tips: Please keep the boxes that topical medications come in in order to help keep track of the instructions about where and how to use these. Pharmacies typically print the medication instructions only on the boxes and not directly on the medication tubes.   If your medication is too  expensive, please contact our office at [phone] or send [place] a message through MyChart.    We are unable to tell what your co-pay for medications will be in advance as this is different depending on your insurance coverage. However, we may be able to find a substitute medication at lower cost or fill out paperwork to get insurance to cover a needed medication.    If a prior authorization is required to get your medication covered by your insurance company, please allow [place] 1-2 business days to complete this process.   Drug prices often  vary depending on where the prescription is filled and some pharmacies may offer cheaper prices.   The website www.goodrx.com contains coupons for medications through different pharmacies. The prices here do not account for what the cost may be with help from insurance (it may be cheaper with your insurance), but the website can give you the price if you did not use any insurance.  - You can print the associated coupon and take it with your prescription to the pharmacy.  - You may also stop by our office during regular business hours and pick up a GoodRx coupon card.  - If you need your prescription sent electronically to a different pharmacy, notify our office through [facility] or by phone at [phone]

## 2022-12-30 NOTE — Progress Notes (Signed)
Care Coordination Note  12/30/2022 Name: Marie [name] MRN: [id number] DOB: 10-18-1955  Marie [name] is a 68 y.o. year old female who is a primary care patient of Mliss Sax, MD and is actively engaged with the care management team. I reached out to [name] by phone today to assist with re-scheduling a follow up visit with the RN Case Manager  Follow up plan: Telephone appointment with care management team member scheduled for:01/20/23  [facility] Coordination Care Guide  Direct Dial: [phone]

## 2023-01-06 ENCOUNTER — Telehealth: Payer: Self-pay | Admitting: Family Medicine

## 2023-01-06 DIAGNOSIS — E11319 Type 2 diabetes mellitus with unspecified diabetic retinopathy without macular edema: Secondary | ICD-10-CM

## 2023-01-06 MED ORDER — METFORMIN HCL ER 500 MG PO TB24
ORAL_TABLET | ORAL | 1 refills | Status: DC
Start: 2023-01-06 — End: 2023-01-08

## 2023-01-06 NOTE — Telephone Encounter (Signed)
Rx sent to the pharmacy and patient notified VIA. Dm/cma

## 2023-01-06 NOTE — Telephone Encounter (Signed)
Prescription Request  01/06/2023  LOV: 11/27/2022  What is the name of the medication or equipment? metFORMIN (GLUCOPHAGE-XR) 500 MG 24 hr tablet [[id number]]   Have you contacted your pharmacy to request a refill? Yes, Dr Lucianne Muss has retired and she hasn't seen new provider. She is wondering if Dr Doreene Burke can full this. She is on her last day of her pills, she also takes 4 a day.   Which pharmacy would you like this sent to  [id number] - [place], [place] - [address] [place] [address] Phone: [phone] Fax: [phone]     Patient notified that their request is being sent to the clinical staff for review and that they should receive a response within 2 business days.   Please advise at [facility] [phone]

## 2023-01-08 ENCOUNTER — Other Ambulatory Visit: Payer: Self-pay

## 2023-01-08 DIAGNOSIS — E11319 Type 2 diabetes mellitus with unspecified diabetic retinopathy without macular edema: Secondary | ICD-10-CM

## 2023-01-08 MED ORDER — METFORMIN HCL ER 500 MG PO TB24
ORAL_TABLET | ORAL | 1 refills | Status: DC
Start: 2023-01-08 — End: 2023-05-03

## 2023-01-20 ENCOUNTER — Ambulatory Visit: Payer: Self-pay

## 2023-01-20 NOTE — Patient Outreach (Signed)
  Care Coordination   01/20/2023 Name: Marie [name] MRN: [id number] DOB: 07-Oct-1955   Care Coordination Outreach Attempts:  An unsuccessful telephone outreach was attempted for a scheduled appointment today.  Follow Up Plan:  Additional outreach attempts will be made to offer the patient care coordination information and services.   Encounter Outcome:  No Answer   Care Coordination Interventions:  No, not indicated     Bary Leriche, RN, MSN [facility]  [facility], [facility] Management Community Coordinator Direct Dial: [phone]  Fax: [phone] Website: [email or web address]

## 2023-01-22 ENCOUNTER — Telehealth: Payer: Self-pay

## 2023-01-22 ENCOUNTER — Telehealth: Payer: Self-pay | Admitting: Family Medicine

## 2023-01-22 ENCOUNTER — Ambulatory Visit: Payer: Medicare PPO | Admitting: Family Medicine

## 2023-01-22 NOTE — Patient Outreach (Signed)
  Care Coordination   01/22/2023 Name: Marie [name] MRN: [id number] DOB: Oct 03, 1955   Care Coordination Outreach Attempts:  A second unsuccessful outreach was attempted today to offer the patient with information about available care coordination services.  Follow Up Plan:  Additional outreach attempts will be made to offer the patient care coordination information and services.   Encounter Outcome:  No Answer   Care Coordination Interventions:  No, not indicated     Bary Leriche, RN, MSN [facility]  [facility], [facility] Management Community Coordinator Direct Dial: [phone]  Fax: [phone] Website: [email or web address]

## 2023-01-22 NOTE — Telephone Encounter (Signed)
1st missed visit

## 2023-01-22 NOTE — Telephone Encounter (Signed)
Pt was a no show for an OV with Dr Doreene Burke on 01/22/23, I did send a letter.

## 2023-01-26 ENCOUNTER — Ambulatory Visit: Payer: Medicare PPO | Admitting: Family Medicine

## 2023-01-26 ENCOUNTER — Encounter: Payer: Self-pay | Admitting: Family Medicine

## 2023-01-26 VITALS — BP 122/66 | HR 86 | Temp 98.1°F | Ht 61.0 in | Wt 126.8 lb

## 2023-01-26 DIAGNOSIS — E11319 Type 2 diabetes mellitus with unspecified diabetic retinopathy without macular edema: Secondary | ICD-10-CM

## 2023-01-26 DIAGNOSIS — R351 Nocturia: Secondary | ICD-10-CM

## 2023-01-26 DIAGNOSIS — N3941 Urge incontinence: Secondary | ICD-10-CM | POA: Diagnosis not present

## 2023-01-26 DIAGNOSIS — Z7984 Long term (current) use of oral hypoglycemic drugs: Secondary | ICD-10-CM | POA: Diagnosis not present

## 2023-01-26 DIAGNOSIS — Z9189 Other specified personal risk factors, not elsewhere classified: Secondary | ICD-10-CM | POA: Diagnosis not present

## 2023-01-26 DIAGNOSIS — L84 Corns and callosities: Secondary | ICD-10-CM

## 2023-01-26 NOTE — Progress Notes (Signed)
Established Patient Office Visit   Subjective:  Patient ID: Marie [name], female    DOB: [birth year]  Age: 67 y.o. MRN: [id number]  Chief Complaint  Patient presents with   Foot Pain    Right foot pain Ongoing pain that has gotten worse   Urinary Incontinence    Started 4-5 months ago    Travel Consult    Traveling 03/13/23 [place]     Foot Pain Pertinent negatives include no abdominal pain, chills, fever, myalgias, rash or weakness.   Encounter Diagnoses  Name Primary?   Callus of foot Yes   Urge incontinence    Type 2 diabetes mellitus with retinopathy, without long-term current use of insulin, macular edema presence unspecified, unspecified laterality, unspecified retinopathy severity (HCC)    At risk for infectious disease due to recent foreign travel    Nocturia    For follow-up of pain in the right foot, urinary incontinence and upcoming travel.  Reports pain in the balls of her feet on the right.  She tells of the loss of urine that occurs when she is on her way to the bathroom.  She does not lose urine with cough or sneeze.  She drinks a lot of water right up until bedtime.  She experiences nocturia x 2.   Review of Systems  Constitutional: Negative.  Negative for chills and fever.  HENT: Negative.    Eyes:  Negative for blurred vision, discharge and redness.  Respiratory: Negative.    Cardiovascular: Negative.   Gastrointestinal:  Negative for abdominal pain.  Genitourinary:  Positive for urgency. Negative for dysuria, flank pain, frequency and hematuria.  Musculoskeletal: Negative.  Negative for myalgias.  Skin:  Negative for rash.  Neurological:  Negative for tingling, loss of consciousness and weakness.  Endo/Heme/Allergies:  Negative for polydipsia.     Current Outpatient Medications:    ascorbic acid (VITAMIN C) 500 MG tablet, Take 500 mg by mouth daily., Disp: , Rfl:    atorvastatin (LIPITOR) 80 MG tablet, TAKE 1 TABLET EVERY DAY, Disp: 90 tablet,  Rfl: 3   Cholecalciferol (VITAMIN D3) 3000 units TABS, Take 3,000 Units by mouth at bedtime., Disp: , Rfl:    Continuous Blood Gluc Sensor (FREESTYLE LIBRE 3 SENSOR) MISC, Place 1 sensor on the skin every 14 days. Use to check glucose continuously, Disp: 6 each, Rfl: 3   dapagliflozin propanediol (FARXIGA) 10 MG TABS tablet, Take 1 tablet (10 mg total) by mouth daily., Disp: 90 tablet, Rfl: 1   escitalopram (LEXAPRO) 10 MG tablet, TAKE 1 TABLET(10 MG) BY MOUTH DAILY, Disp: 90 tablet, Rfl: 1   metFORMIN (GLUCOPHAGE-XR) 500 MG 24 hr tablet, TAKE 1 TABLETS IN THE MORNING AND 2 tabs T BEDTIME, Disp: 270 tablet, Rfl: 1   Multiple Vitamin (MULTIVITAMIN WITH MINERALS) TABS tablet, Take 1 tablet by mouth daily., Disp: , Rfl:    repaglinide (PRANDIN) 2 MG tablet, TAKE 2 TABLETS BEFORE BREAKFAST, TAKE 1 TABLET AT LUNCH, AND TAKE 1 TABLET BEFORE DINNER, Disp: 360 tablet, Rfl: 3   Safety Seal Miscellaneous MISC, Apply 1 Application topically daily. Medication Name: AA Gel, Disp: 45 g, Rfl: 4   traZODone (DESYREL) 50 MG tablet, Take 1 tablet (50 mg total) by mouth at bedtime., Disp: 90 tablet, Rfl: 3   valsartan (DIOVAN) 80 MG tablet, Take 1 tablet (80 mg total) by mouth 2 (two) times daily., Disp: 180 tablet, Rfl: 1   Objective:     BP 122/66   Pulse 86  Temp 98.1 F (36.7 C) (Temporal)   Ht 5\' 1"  (1.549 m)   Wt 126 lb 12.8 oz (57.5 kg)   SpO2 98%   BMI 23.96 kg/m    Physical Exam Constitutional:      General: She is not in acute distress.    Appearance: Normal appearance. She is not ill-appearing, toxic-appearing or diaphoretic.  HENT:     Head: Normocephalic and atraumatic.     Right Ear: External ear normal.     Left Ear: External ear normal.  Eyes:     General: No scleral icterus.       Right eye: No discharge.        Left eye: No discharge.     Extraocular Movements: Extraocular movements intact.     Conjunctiva/sclera: Conjunctivae normal.  Pulmonary:     Effort: Pulmonary effort  is normal. No respiratory distress.  Musculoskeletal:     Comments: Feet are cavus.  There is callus formation on the sole of her right foot at the third MTP and in between the fourth and fifth MTP  Skin:    General: Skin is warm and dry.  Neurological:     Mental Status: She is alert and oriented to person, place, and time.  Psychiatric:        Mood and Affect: Mood normal.        Behavior: Behavior normal.      No results found for any visits on 01/26/23.    The ASCVD Risk score (Arnett DK, et al., 2019) failed to calculate for the following reasons:   The patient has a prior MI or stroke diagnosis    Assessment & Plan:   Callus of foot -     Ambulatory referral to Podiatry  Urge incontinence  Type 2 diabetes mellitus with retinopathy, without long-term current use of insulin, macular edema presence unspecified, unspecified laterality, unspecified retinopathy severity (HCC) -     Urinalysis, Routine w reflex microscopic; Future  At risk for infectious disease due to recent foreign travel -     Ambulatory referral to Travel Clinic  Nocturia    No follow-ups on file.  Discussed Kegel exercises and how to perform them.  Information was given on Kegel exercises.  Information was given on calluses.  Referral to podiatry for trimming.  Referred to travel clinic.  Information was also given on passport health.  Agreed to collect her urine before her endocrinology appointment in December.  She said that we have been doing this for her all along.  I was totally unaware of this and do not understand the logic behind it.  Mliss Sax, MD

## 2023-01-28 ENCOUNTER — Telehealth: Payer: Self-pay

## 2023-01-28 ENCOUNTER — Other Ambulatory Visit: Payer: Self-pay

## 2023-01-28 DIAGNOSIS — E1165 Type 2 diabetes mellitus with hyperglycemia: Secondary | ICD-10-CM

## 2023-01-28 MED ORDER — DAPAGLIFLOZIN PROPANEDIOL 10 MG PO TABS: 10.0000 mg | ORAL_TABLET | Freq: Every day | ORAL | 1 refills | Status: DC

## 2023-01-28 MED ORDER — REPAGLINIDE 2 MG PO TABS: ORAL_TABLET | ORAL | 3 refills | Status: DC

## 2023-01-28 MED ORDER — FREESTYLE LIBRE 3 SENSOR MISC: 3 refills | Status: AC

## 2023-01-28 NOTE — Telephone Encounter (Signed)
Refills requested for meds previously prescribed by MD who has now retired be sent to pharmacy. Sent 3 RX over as requested, patient aware

## 2023-01-29 ENCOUNTER — Telehealth: Payer: Self-pay

## 2023-01-29 NOTE — Patient Instructions (Signed)
Visit Information  Thank you for taking time to visit with me today. Please don't hesitate to contact me if I can be of assistance to you.   Following are the goals we discussed today:   Goals Addressed             This Visit's Progress    Diabetes Management       Patient Goals/Self Care Activities: -Patient/Caregiver will self-administer medications as prescribed as evidenced by self-report/primary caregiver report  -Patient/Caregiver will attend all scheduled provider appointments as evidenced by clinician review of documented attendance to scheduled appointments and patient/caregiver report -Patient/Caregiver will call provider office for new concerns or questions as evidenced by review of documented incoming telephone call notes and patient report  -check blood sugar at prescribed times -check blood sugar if I feel it is too high or too low -record values and write them down take them to all doctor visits  -limit carbohydrates and sweets   Spoke with patient. She reports she is doing okay.  She is planning to go to [place] in December. Working on travel recommendations. Bloods sugar running 111-124 via her libre.  Discussed diabetes management.   She verbalized understanding. No concerns.          Our next appointment is by telephone on 02/26/23 at 930 am  Please call the care guide team at [phone] if you need to cancel or reschedule your appointment.   If you are experiencing a Mental Health or Behavioral Health Crisis or need someone to talk to, please call the Suicide and Crisis Lifeline: 988   Patient verbalizes understanding of instructions and care plan provided today and agrees to view in MyChart. Active MyChart status and patient understanding of how to access instructions and care plan via MyChart confirmed with patient.     The patient has been provided with contact information for the care management team and has been advised to call with any health  related questions or concerns.   Bary Leriche, RN, MSN [facility], [facility] Management Community Coordinator Direct Dial: [phone]  Fax: [phone] Website: [email or web address]

## 2023-01-29 NOTE — Patient Outreach (Signed)
  Care Coordination   Follow Up Visit Note   01/29/2023 Name: Marie [name] MRN: [id number] DOB: Aug 21, 1955  Marie [name] is a 67 y.o. year old female who sees Mliss Sax, MD for primary care. I spoke with  [name] by phone today.  What matters to the patients health and wellness today?  Maintain health    Goals Addressed             This Visit's Progress    Diabetes Management       Patient Goals/Self Care Activities: -Patient/Caregiver will self-administer medications as prescribed as evidenced by self-report/primary caregiver report  -Patient/Caregiver will attend all scheduled provider appointments as evidenced by clinician review of documented attendance to scheduled appointments and patient/caregiver report -Patient/Caregiver will call provider office for new concerns or questions as evidenced by review of documented incoming telephone call notes and patient report  -check blood sugar at prescribed times -check blood sugar if I feel it is too high or too low -record values and write them down take them to all doctor visits  -limit carbohydrates and sweets   Spoke with patient. She reports she is doing okay.  She is planning to go to [place] in December. Working on travel recommendations. Bloods sugar running 111-124 via her libre.  Discussed diabetes management.   She verbalized understanding. No concerns.          SDOH assessments and interventions completed:  Yes     Care Coordination Interventions:  Yes, provided   Follow up plan: Follow up call scheduled for November    Encounter Outcome:  Patient Visit Completed   Bary Leriche, RN, MSN [place]  [facility], [facility] Management Community Coordinator Direct Dial: [phone]  Fax: [phone] Website: [email or web address]

## 2023-02-03 ENCOUNTER — Other Ambulatory Visit: Payer: Self-pay

## 2023-02-03 DIAGNOSIS — E1165 Type 2 diabetes mellitus with hyperglycemia: Secondary | ICD-10-CM

## 2023-02-03 MED ORDER — REPAGLINIDE 2 MG PO TABS: ORAL_TABLET | ORAL | 3 refills | Status: DC

## 2023-02-12 ENCOUNTER — Ambulatory Visit: Payer: Medicare PPO | Admitting: Podiatry

## 2023-02-16 ENCOUNTER — Ambulatory Visit: Payer: Medicare PPO | Admitting: Family Medicine

## 2023-02-23 ENCOUNTER — Encounter (HOSPITAL_BASED_OUTPATIENT_CLINIC_OR_DEPARTMENT_OTHER): Payer: Self-pay | Admitting: Family

## 2023-02-23 ENCOUNTER — Ambulatory Visit: Payer: Medicare PPO | Admitting: Endocrinology

## 2023-02-23 ENCOUNTER — Telehealth: Payer: Self-pay | Admitting: Family Medicine

## 2023-02-23 ENCOUNTER — Ambulatory Visit (HOSPITAL_BASED_OUTPATIENT_CLINIC_OR_DEPARTMENT_OTHER): Payer: Medicare PPO | Admitting: Family

## 2023-02-23 VITALS — BP 138/72 | HR 95 | Ht 61.0 in | Wt 127.6 lb

## 2023-02-23 DIAGNOSIS — I10 Essential (primary) hypertension: Secondary | ICD-10-CM

## 2023-02-23 DIAGNOSIS — Z8673 Personal history of transient ischemic attack (TIA), and cerebral infarction without residual deficits: Secondary | ICD-10-CM

## 2023-02-23 NOTE — Telephone Encounter (Signed)
Pt has stated that [facility] on [address]  has told her she has had the first MENINGOCOCCAL VACCINE and now she needs the 2nd part. She really needs this soon so she can get her Visa.

## 2023-02-23 NOTE — Patient Instructions (Signed)
Medication Instructions:  Your physician recommends that you continue on your current medications as directed. Please refer to the Current Medication list given to you today.  *If you need a refill on your cardiac medications before your next appointment, please call your pharmacy*  Lab Work: NONE  Testing/Procedures: NONE  Follow-Up: At [facility], you and your health needs are our priority.  As part of our continuing mission to provide you with exceptional heart care, we have created designated Provider Care Teams.  These Care Teams include your primary Cardiologist (physician) and Advanced Practice Providers (APPs -  Physician Assistants and Nurse Practitioners) who all work together to provide you with the care you need, when you need it.  We recommend signing up for the patient portal called "MyChart".  Sign up information is provided on this After Visit Summary.  MyChart is used to connect with patients for Virtual Visits (Telemedicine).  Patients are able to view lab/test results, encounter notes, upcoming appointments, etc.  Non-urgent messages can be sent to your provider as well.   To learn more about what you can do with MyChart, go to [email or web address].    Your next appointment:   6 month(s)  The format for your next appointment:   In Person  Provider:   Gillian Shields, NP

## 2023-02-23 NOTE — Progress Notes (Unsigned)
Cardiology Office Note:  .   Date:  02/24/2023  ID:  Marie [name], DOB 07/14/1955, MRN [id number] PCP: Mliss Sax, MD  [facility] Providers Cardiologist:  Jodelle Red, MD    History of Present Illness: .   Marie [name] is a 67 y.o. female with history of CVA, hypertension, DM2, hyperlipidemia, carotid stenosis.  Prior CVAs with workup unrevealing.  Loop recorder implanted 2015 with no evidence of arrhythmia.  Last in by Dr. Cristal Deer 09/01/2022 doing overall well, exercising daily.  Discussed the use of AI scribe software for clinical note transcription with the patient, who gave verbal consent to proceed.     Presents today for follow up. Wanted to be seen prior to an upcoming 15 day trip to [place]. She expresses understandable concern about leaving her adult son with Down syndrome behind and about the cultural differences she will encounter, particularly in medical settings  if needed. She is also worried about her visa application, which was delayed due to a requirement for a meningococcal vaccine. Discussed she could get at [facility].   In addition to these travel-related concerns, the patient discusses her diabetes management. She uses a Libre device to monitor her blood sugar levels, but has had issues with the device falling off. She has contacted the manufacturer, who suggested a specific type of bandage to secure the device, and is seeking help in obtaining this. We provided examples of bandage from online resources and also what to ask her pharmacy for.  The patient also mentions occasional lightheadedness when getting up suddenly, which she manages by moving more slowly. no near syncope, syncope. She has not been monitoring her blood pressure at home, but recent readings in various medical settings have been satisfactory. No chest pain, dyspnea, edema, orthopnea, PND.       ROS: Please see the history of present illness.    All  other systems reviewed and are negative.   Studies Reviewed: [name]   EKG Interpretation Date/Time:  Tuesday February 23 2023 15:05:41 EST Ventricular Rate:  99 PR Interval:  138 QRS Duration:  76 QT Interval:  338 QTC Calculation: 433 R Axis:   43  Text Interpretation: Normal sinus rhythm No acute changes Confirmed by Gillian Shields ([id number]) on 02/24/2023 11:22:56 AM        Risk Assessment/Calculations:             Physical Exam:   VS:  BP 138/72   Pulse 95   Ht 5\' 1"  (1.549 m)   Wt 127 lb 9.6 oz (57.9 kg)   SpO2 97%   BMI 24.11 kg/m    Wt Readings from Last 3 Encounters:  02/23/23 127 lb 9.6 oz (57.9 kg)  01/26/23 126 lb 12.8 oz (57.5 kg)  11/27/22 129 lb 9.6 oz (58.8 kg)    Vitals:   02/23/23 1502 02/23/23 1538  BP: (!) 152/78 138/72  Pulse: 95   Height: 5\' 1"  (1.549 m)   Weight: 127 lb 9.6 oz (57.9 kg)   SpO2: 97%   BMI (Calculated): 24.12     GEN: Well nourished, well developed in no acute distress NECK: No JVD; No carotid bruits CARDIAC: RRR, no murmurs, rubs, gallops RESPIRATORY:  Clear to auscultation without rales, wheezing or rhonchi  ABDOMEN: Soft, non-tender, non-distended EXTREMITIES:  No edema; No deformity   ASSESSMENT AND PLAN: .        Blood Pressure Management Initial blood pressure was slightly elevated, but decreased upon  recheck. Still mildly above goal <130/80. Patient reports good blood pressure control at recent specialist visits. -Continue current blood pressure management with valsartan. -Monitor BP at home and report if consistently >130/80   Hx of CVA / HLD, LDL goal < 70 -Continue present dose Atorvastatin  General Health Maintenance Patient reports Libre sensor occasionally falls off. -Recommend patient to purchase [employer] or at pharmacy for better adhesion. Provided her with print outs of example stickers.  Follow-up in 6 months or as needed before then.              Signed, Alver Sorrow, NP

## 2023-02-23 NOTE — Telephone Encounter (Signed)
Pt called to say she already had the 1st part of her meningococcal vaccine. She needs the 2nd one to be called in to pharmacy. She needs this asap as she is travelling out of the country.

## 2023-02-23 NOTE — Telephone Encounter (Signed)
   02/23/2023  LOV: 01/26/2023  Pt request prescription for MENINGOCOCCAL VACCINE be sent to pharmacy  Which pharmacy would you like this sent to?    [id number] [place], [place] - [address] AT [address] [place] [address] Phone: [phone] Fax: [phone]   Patient notified that their request is being sent to the clinical staff for review and that they should receive a response within 2 business days.   Please advise at Mobile [phone] (mobile)

## 2023-02-24 ENCOUNTER — Other Ambulatory Visit (HOSPITAL_BASED_OUTPATIENT_CLINIC_OR_DEPARTMENT_OTHER): Payer: Self-pay

## 2023-02-24 ENCOUNTER — Ambulatory Visit: Payer: Medicare PPO | Admitting: Podiatry

## 2023-02-24 MED ORDER — BEXSERO 0.5 ML IM SUSY
0.5000 mL | PREFILLED_SYRINGE | Freq: Once | INTRAMUSCULAR | 0 refills | Status: AC
  Filled 2023-02-24: qty 0.5, 1d supply, fill #0

## 2023-02-26 ENCOUNTER — Ambulatory Visit: Payer: Self-pay

## 2023-02-26 NOTE — Patient Outreach (Signed)
  Care Coordination   Follow Up Visit Note   02/26/2023 Name: Marie [name] MRN: [id number] DOB: [birth year]  Marie [name] is a 67 y.o. year old female who sees Mliss Sax, MD for primary care. I spoke with  [name] by phone today.  What matters to the patients health and wellness today?  Maintain health    Goals Addressed             This Visit's Progress    Diabetes Management       Patient Goals/Self Care Activities: -Patient/Caregiver will self-administer medications as prescribed as evidenced by self-report/primary caregiver report  -Patient/Caregiver will attend all scheduled provider appointments as evidenced by clinician review of documented attendance to scheduled appointments and patient/caregiver report -Patient/Caregiver will call provider office for new concerns or questions as evidenced by review of documented incoming telephone call notes and patient report  -check blood sugar at prescribed times -check blood sugar if I feel it is too high or too low -record values and write them down take them to all doctor visits  -limit carbohydrates and sweets   Spoke with patient. She reports she is doing okay.  She is set to go to [place] in December. She reports blood sugars under better control with this morning sugar 108.  She credits this to using the [place].  Reiterated diabetes management.   She verbalized understanding. No concerns.          SDOH assessments and interventions completed:  Yes  SDOH Interventions Today    Flowsheet Row Most Recent Value  SDOH Interventions   Food Insecurity Interventions Intervention Not Indicated  Housing Interventions Intervention Not Indicated  Transportation Interventions Intervention Not Indicated  Utilities Interventions Intervention Not Indicated        Care Coordination Interventions:  Yes, provided   Follow up plan: Follow up call scheduled for January    Encounter Outcome:  Patient Visit  Completed   Bary Leriche, RN, MSN [place]  [facility], [facility] Management Community Coordinator Direct Dial: [phone]  Fax: [phone] Website: [email or web address]

## 2023-02-26 NOTE — Patient Instructions (Signed)
Visit Information  Thank you for taking time to visit with me today. Please don't hesitate to contact me if I can be of assistance to you.   Following are the goals we discussed today:   Goals Addressed             This Visit's Progress    Diabetes Management       Patient Goals/Self Care Activities: -Patient/Caregiver will self-administer medications as prescribed as evidenced by self-report/primary caregiver report  -Patient/Caregiver will attend all scheduled provider appointments as evidenced by clinician review of documented attendance to scheduled appointments and patient/caregiver report -Patient/Caregiver will call provider office for new concerns or questions as evidenced by review of documented incoming telephone call notes and patient report  -check blood sugar at prescribed times -check blood sugar if I feel it is too high or too low -record values and write them down take them to all doctor visits  -limit carbohydrates and sweets   Spoke with patient. She reports she is doing okay.  She is set to go to [place] in December. She reports blood sugars under better control with this morning sugar 108.  She credits this to using the [place].  Reiterated diabetes management.   She verbalized understanding. No concerns.          Our next appointment is by telephone on 04/09/23 at 930 am  Please call the care guide team at [phone] if you need to cancel or reschedule your appointment.   If you are experiencing a Mental Health or Behavioral Health Crisis or need someone to talk to, please call the Suicide and Crisis Lifeline: 988   Patient verbalizes understanding of instructions and care plan provided today and agrees to view in MyChart. Active MyChart status and patient understanding of how to access instructions and care plan via MyChart confirmed with patient.     The patient has been provided with contact information for the care management team and has been advised  to call with any health related questions or concerns.   Bary Leriche, RN, MSN [facility], [facility] Management Community Coordinator Direct Dial: [phone]  Fax: [phone] Website: [email or web address]

## 2023-03-01 ENCOUNTER — Other Ambulatory Visit: Payer: Medicare PPO

## 2023-03-08 ENCOUNTER — Ambulatory Visit: Payer: Medicare PPO | Admitting: Endocrinology

## 2023-03-09 ENCOUNTER — Other Ambulatory Visit: Payer: Self-pay | Admitting: Family Medicine

## 2023-03-09 DIAGNOSIS — I10 Essential (primary) hypertension: Secondary | ICD-10-CM

## 2023-03-17 ENCOUNTER — Ambulatory Visit (HOSPITAL_BASED_OUTPATIENT_CLINIC_OR_DEPARTMENT_OTHER): Payer: Medicare PPO | Admitting: Cardiology

## 2023-04-09 ENCOUNTER — Ambulatory Visit: Payer: Self-pay

## 2023-04-09 NOTE — Patient Outreach (Signed)
  Care Coordination   04/09/2023 Name: Marie [name] MRN: [id number] DOB: 1955/09/17   Care Coordination Outreach Attempts:  An unsuccessful outreach was attempted for an appointment today.  Follow Up Plan:  Additional outreach attempts will be made to offer the patient complex care management information and services.   Encounter Outcome:  No Answer   Care Coordination Interventions:  No, not indicated    Wyn Nettle J Landy Dunnavant, RN, MSN RN Care Manager [facility], Population Health Direct Dial: [phone]  Fax: [phone] Website: [email or web address]

## 2023-04-21 ENCOUNTER — Other Ambulatory Visit: Payer: Self-pay

## 2023-04-21 DIAGNOSIS — E1165 Type 2 diabetes mellitus with hyperglycemia: Secondary | ICD-10-CM

## 2023-04-23 ENCOUNTER — Telehealth: Payer: Self-pay

## 2023-04-23 NOTE — Patient Outreach (Signed)
  Care Coordination   04/23/2023 Name: Marie [name] MRN: [id number] DOB: 04-15-1955   Care Coordination Outreach Attempts:  A second unsuccessful outreach was attempted today to offer the patient with information about available complex care management services.  Follow Up Plan:  Additional outreach attempts will be made to offer the patient complex care management information and services.   Encounter Outcome:  No Answer   Care Coordination Interventions:  No, not indicated     Constance Hackenberg Idelle Jo, RN, MSN [facility], [facility] Health RN Care Manager Direct Dial: [phone]  Fax: [phone] Website: [email or web address]

## 2023-04-29 ENCOUNTER — Other Ambulatory Visit: Payer: Medicare PPO

## 2023-04-30 ENCOUNTER — Telehealth: Payer: Self-pay

## 2023-04-30 NOTE — Patient Outreach (Signed)
  Care Coordination   Follow Up Visit Note   04/30/2023 Name: Marie [name] MRN: [id number] DOB: October 24, 1955  Marie [name] is a 68 y.o. year old female who sees Mliss Sax, MD for primary care. I spoke with  [name] by phone today.  What matters to the patients health and wellness today?  Maintain diabetes    Goals Addressed             This Visit's Progress    Diabetes Management       Patient Goals/Self Care Activities: -Patient/Caregiver will self-administer medications as prescribed as evidenced by self-report/primary caregiver report  -Patient/Caregiver will attend all scheduled provider appointments as evidenced by clinician review of documented attendance to scheduled appointments and patient/caregiver report -Patient/Caregiver will call provider office for new concerns or questions as evidenced by review of documented incoming telephone call notes and patient report  -check blood sugar at prescribed times -check blood sugar if I feel it is too high or too low -record values and write them down take them to all doctor visits  -limit carbohydrates and sweets   Spoke with patient. She reports she is doing good.  She went out of the country and has returned.  She reports that her average blood sugars are around 156.  Congratulated her on better control of sugars and advised to keep up the great work.  She verbalized understanding. No concerns.          SDOH assessments and interventions completed:  Yes     Care Coordination Interventions:  Yes, provided   Follow up plan: Follow up call scheduled for March    Encounter Outcome:  Patient Visit Completed   Bary Leriche RN, MSN [facility], [facility] Health RN Care Manager Direct Dial: [phone]  Fax: [phone] Website: [email or web address]

## 2023-04-30 NOTE — Patient Instructions (Signed)
Visit Information  Thank you for taking time to visit with me today. Please don't hesitate to contact me if I can be of assistance to you.   Following are the goals we discussed today:   Goals Addressed             This Visit's Progress    Diabetes Management       Patient Goals/Self Care Activities: -Patient/Caregiver will self-administer medications as prescribed as evidenced by self-report/primary caregiver report  -Patient/Caregiver will attend all scheduled provider appointments as evidenced by clinician review of documented attendance to scheduled appointments and patient/caregiver report -Patient/Caregiver will call provider office for new concerns or questions as evidenced by review of documented incoming telephone call notes and patient report  -check blood sugar at prescribed times -check blood sugar if I feel it is too high or too low -record values and write them down take them to all doctor visits  -limit carbohydrates and sweets   Spoke with patient. She reports she is doing good.  She went out of the country and has returned.  She reports that her average blood sugars are around 156.  Congratulated her on better control of sugars and advised to keep up the great work.  She verbalized understanding. No concerns.          Our next appointment is by telephone on 06/25/23 at 1030 am  Please call the care guide team at [phone] if you need to cancel or reschedule your appointment.   If you are experiencing a Mental Health or Behavioral Health Crisis or need someone to talk to, please call the Suicide and Crisis Lifeline: 988   Patient verbalizes understanding of instructions and care plan provided today and agrees to view in MyChart. Active MyChart status and patient understanding of how to access instructions and care plan via MyChart confirmed with patient.     The patient has been provided with contact information for the care management team and has been advised  to call with any health related questions or concerns.   Bary Leriche RN, MSN [facility], [facility] RN Care Manager Direct Dial: [phone]  Fax: [phone] Website: [email or web address]

## 2023-05-03 ENCOUNTER — Ambulatory Visit: Payer: Medicare PPO | Admitting: Dermatology

## 2023-05-03 ENCOUNTER — Encounter: Payer: Self-pay | Admitting: Endocrinology

## 2023-05-03 ENCOUNTER — Ambulatory Visit: Payer: Medicare PPO | Admitting: Endocrinology

## 2023-05-03 VITALS — BP 134/70 | HR 81 | Resp 20 | Ht 61.0 in | Wt 132.8 lb

## 2023-05-03 DIAGNOSIS — E1165 Type 2 diabetes mellitus with hyperglycemia: Secondary | ICD-10-CM

## 2023-05-03 DIAGNOSIS — E11319 Type 2 diabetes mellitus with unspecified diabetic retinopathy without macular edema: Secondary | ICD-10-CM

## 2023-05-03 DIAGNOSIS — Z7984 Long term (current) use of oral hypoglycemic drugs: Secondary | ICD-10-CM

## 2023-05-03 DIAGNOSIS — E118 Type 2 diabetes mellitus with unspecified complications: Secondary | ICD-10-CM

## 2023-05-03 LAB — POCT GLYCOSYLATED HEMOGLOBIN (HGB A1C): Hemoglobin A1C: 6.6 % — AB (ref 4.0–5.6)

## 2023-05-03 MED ORDER — REPAGLINIDE 2 MG PO TABS: ORAL_TABLET | ORAL | 3 refills | Status: DC

## 2023-05-03 MED ORDER — METFORMIN HCL ER 500 MG PO TB24: ORAL_TABLET | ORAL | 3 refills | Status: AC

## 2023-05-03 NOTE — Patient Instructions (Addendum)
Latest Reference Range & Units 08/20/22 10:23 11/18/22 09:43 05/03/23 11:26  Hemoglobin A1C 4.0 - 5.6 % 7.0 ! 7.1 (H) 6.6 !  !: Data is abnormal (H): Data is abnormally high  Take metformin 1 tab  with breakfast and 1 tab with supper.  Repaglinide 1 tab with breakfast, 1 tab lunch and 1 tab dinner.

## 2023-05-03 NOTE — Progress Notes (Unsigned)
Outpatient Endocrinology Note [place] Colbi Schiltz, MD   Patient's Name: Marie [name]    DOB: Feb 17, 1956    MRN: [id number]                                                    REASON OF VISIT: New consult / Follow up for type *** diabetes mellitus  REFERRING PROVIDER:   PCP: Mliss Sax, MD  HISTORY OF PRESENT ILLNESS:   Marie [name] is a 68 y.o. old female with past medical history listed below, is here for new consult / follow up for type *** diabetes mellitus.   Pertinent Diabetes History: _Diagnosed as Diabetes Mellitus type ***  in ***, at the age of *** years.  Prior therapy: Initially managed with *** and insulin started in ***  History of DKA or diabetes related hospitalizations: ***none  Previous diabetes education: Yes ***  Family h/o diabetes mellitus: ***   No personal history of pancreatitis and / or family history of medullary thyroid carcinoma or MEN 2B syndrome. ***  Chronic Diabetes Complications : Retinopathy: ***. Last ophthalmology exam was done on ***, following with ophthalmology regularly.  Nephropathy: ***, on ACE/ARB *** Peripheral neuropathy: ***, on *** Coronary artery disease: *** Stroke: ***  Relevant comorbidities and cardiovascular risk factors: Obesity: *** Body mass index is 25.09 kg/m.  Hypertension: Yes *** Hyperlipidemia : Yes, on statin ***  Current / Home Diabetic regimen includes:  Non-insulin hypoglycemic drugs the patient is taking are: Metformin 1.0 g daily, also Farxiga 5 mg at night, Prandin 4-2-2 mg before meals     Prior diabetic medications:  Glycemic data:    CONTINUOUS GLUCOSE MONITORING SYSTEM (CGMS) INTERPRETATION: At today's visit, we reviewed CGM downloads. The full report is scanned in the media. Reviewing the CGM trends, blood glucose are as follows:  Dexcom G7 CGM-  Sensor Download (Sensor download was reviewed and summarized below.) Dates: ***  Glucose Management Indicator: ***% Sensor Average: ***  SD *** Sensor usage : *** %  Glycemic Trends:  <54: ***% 54-70: ***% 71-180: ***% 181-250: ***% 251-400: ***%  Interpretation: - ***  FreeStyle Libre 3+ CGM-  Sensor Download (Sensor download was reviewed and summarized below.) Dates: January 14 to May 03, 2023, 14 days   Glucose Management Indicator: 6.9%  % data captured: 97%    Interpretation: -Mostly acceptable blood sugar with occasional hyperglycemia up to blood sugar to 250s and random hypoglycemia with blood sugar in 60s at different times in the afternoon and overnight.  No significant hypo or hyperglycemia.  GMI 6.9%.  Hypoglycemia: Patient has *** hypoglycemic episodes. Patient has hypoglycemia awareness, has a glucagon ER kit and diabetic alert device ***.   Factors modifying glucose control: 1.  Diabetic diet assessment: ***  2.  Staying active or exercising: ***  3.  Medication compliance: compliant *** of the time.  Interval history  ***  REVIEW OF SYSTEMS As per history of present illness.   PAST MEDICAL HISTORY: Past Medical History:  Diagnosis Date   CVA (cerebral vascular accident) (HCC) 2015   Depression    Diabetes (HCC)    t2   Gallstones    Hyperlipemia    Hypertension    Migraines     PAST SURGICAL HISTORY: Past Surgical History:  Procedure Laterality Date   BREAST BIOPSY Right 05/2019  CESAREAN SECTION     x2   CHOLECYSTECTOMY N/A 05/25/2016   Procedure: LAPAROSCOPIC CHOLECYSTECTOMY WITH INTRAOPERATIVE CHOLANGIOGRAM;  Surgeon: Luretha Murphy, MD;  Location: [facility] ORS;  Service: General;  Laterality: N/A;   DENTAL SURGERY Left 09/09/2022   IR GENERIC HISTORICAL  03/13/2016   IR PERC CHOLECYSTOSTOMY 03/13/2016 [name], MD [facility]INTERV RAD   IR GENERIC HISTORICAL  04/28/2016   IR RADIOLOGIST EVAL & MGMT 04/28/2016 [name], MD [facility] INTER[facility] RAD   LOOP RECORDER IMPLANT N/A 11/29/2013   Procedure: LOOP RECORDER IMPLANT;  Surgeon: Marinus Maw, MD;  Location: [facility] CATH  LAB;  Service: Cardiovascular;  Laterality: N/A;   TEE WITHOUT CARDIOVERSION N/A 08/24/2013   Procedure: TRANSESOPHAGEAL ECHOCARDIOGRAM (TEE);  Surgeon: [name], MD;  Location: [facility] ENDOSCOPY;  Service: Cardiovascular;  Laterality: N/A;    ALLERGIES: Allergies  Allergen Reactions   Penicillins Other (See Comments)    Driving couldn't see and head felt heavy  Has patient had a PCN reaction causing immediate rash, facial/tongue/throat swelling, SOB or lightheadedness with hypotension: No Has patient had a PCN reaction causing severe rash involving mucus membranes or skin necrosis: No Has patient had a PCN reaction that required hospitalization: No Has patient had a PCN reaction occurring within the last 10 years: No If all of the above answers are "NO", then may proceed with Cephalosporin use.     FAMILY HISTORY:  Family History  Problem Relation Age of Onset   Stomach cancer Mother    High blood pressure Mother    Diabetes Mother    Diabetes Maternal Grandfather    High blood pressure Maternal Grandfather    Heart disease Neg Hx     SOCIAL HISTORY: Social History   Socioeconomic History   Marital status: Married    Spouse name: Not on file   Number of children: 3   Years of education: MBA   Highest education level: Not on file  Occupational History    Employer: [employer]    Comment: [employer]  Tobacco Use   Smoking status: Never    Passive exposure: Never   Smokeless tobacco: Never  Vaping Use   Vaping status: Never Used  Substance and Sexual Activity   Alcohol use: No    Alcohol/week: 1.0 standard drink of alcohol    Types: 1 Standard drinks or equivalent per week    Comment: Social   Drug use: No   Sexual activity: Not on file  Other Topics Concern   Not on file  Social History Narrative   Patient lives at home with [name].   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         Social Drivers of Corporate investment banker Strain: Low Risk  (11/02/2022)   Overall Financial Resource Strain (CARDIA)    Difficulty of Paying Living Expenses: Not hard at all  Food Insecurity: No Food Insecurity (02/26/2023)   Hunger Vital Sign    Worried About Running Out of Food in the Last Year: Never true    Ran Out of Food in the Last Year: Never true  Transportation Needs: No Transportation Needs (02/26/2023)   PRAPARE - Administrator, Civil Service (Medical): No    Lack of Transportation (Non-Medical): No  Physical Activity: Sufficiently Active (11/02/2022)   Exercise Vital Sign    Days of Exercise per Week: 4  days    Minutes of Exercise per Session: 50 min  Stress: No Stress Concern Present (11/02/2022)   [employer] of Occupational Health - Occupational Stress Questionnaire    Feeling of Stress : Only a little  Social Connections: Moderately Isolated (11/02/2022)   Social Connection and Isolation Panel [NHANES]    Frequency of Communication with Friends and Family: More than three times a week    Frequency of Social Gatherings with Friends and Family: Once a week    Attends Religious Services: Never    Database administrator or Organizations: No    Attends Engineer, structural: Never    Marital Status: Married    MEDICATIONS:  Current Outpatient Medications  Medication Sig Dispense Refill   ascorbic acid (VITAMIN C) 500 MG tablet Take 500 mg by mouth daily.     atorvastatin (LIPITOR) 80 MG tablet TAKE 1 TABLET EVERY DAY 90 tablet 3   Cholecalciferol (VITAMIN D3) 3000 units TABS Take 3,000 Units by mouth at bedtime.     Continuous Glucose Sensor (FREESTYLE LIBRE 3 SENSOR) MISC Place 1 sensor on the skin every 14 days. Use to check glucose continuously 6 each 3   dapagliflozin propanediol (FARXIGA) 10 MG TABS tablet Take 1 tablet (10 mg total) by mouth daily. 90 tablet 1   escitalopram (LEXAPRO) 10 MG tablet TAKE 1 TABLET(10  MG) BY MOUTH DAILY 90 tablet 1   metFORMIN (GLUCOPHAGE-XR) 500 MG 24 hr tablet TAKE 1 TABLETS IN THE MORNING AND 2 tabs T BEDTIME 270 tablet 1   Multiple Vitamin (MULTIVITAMIN WITH MINERALS) TABS tablet Take 1 tablet by mouth daily.     repaglinide (PRANDIN) 2 MG tablet TAKE 2 TABLETS BEFORE BREAKFAST, TAKE 1 TABLET AT LUNCH, AND TAKE 1 TABLET BEFORE DINNER 360 tablet 3   Safety Seal Miscellaneous MISC Apply 1 Application topically daily. Medication Name: AA Gel 45 g 4   traZODone (DESYREL) 50 MG tablet Take 1 tablet (50 mg total) by mouth at bedtime. 90 tablet 3   valsartan (DIOVAN) 80 MG tablet TAKE 1 TABLET BY MOUTH TWICE DAILY 180 tablet 1   No current facility-administered medications for this visit.    PHYSICAL EXAM: Vitals:   05/03/23 1118  BP: 134/70  Pulse: 81  Resp: 20  SpO2: 99%  Weight: 132 lb 12.8 oz (60.2 kg)  Height: 5\' 1"  (1.549 m)   Body mass index is 25.09 kg/m.  Wt Readings from Last 3 Encounters:  05/03/23 132 lb 12.8 oz (60.2 kg)  02/23/23 127 lb 9.6 oz (57.9 kg)  01/26/23 126 lb 12.8 oz (57.5 kg)    General: Well developed, well nourished female in no apparent distress.  HEENT: AT/[place], no external lesions.  Eyes: Conjunctiva clear and no icterus. Neck: Neck supple  Lungs: Respirations not labored Neurologic: Alert, oriented, normal speech Extremities / Skin: Dry. No sores or rashes noted. No acanthosis nigricans Psychiatric: Does not appear depressed or anxious  Diabetic Foot Exam: Monofilament sensory exam intact / decreased b/l, no callus, no ulceration, Dorsalis Pedis 2+ b/l  Diabetic Foot Exam - Simple   No data filed     Diabetic Foot Examination {AMB DIABETIC FOOT WUJW:11914}  LABS Reviewed Lab Results  Component Value Date   HGBA1C 6.6 (A) 05/03/2023   HGBA1C 7.1 (H) 11/18/2022   HGBA1C 7.0 (A) 08/20/2022   Lab Results  Component Value Date   FRUCTOSAMINE 246 08/17/2022   FRUCTOSAMINE 262 02/14/2021   FRUCTOSAMINE 260 10/25/2020  Lab Results  Component Value Date   CHOL 112 10/19/2022   HDL 45.10 10/19/2022   LDLCALC 52 10/19/2022   LDLDIRECT 92.0 11/18/2022   TRIG 71.0 10/19/2022   CHOLHDL 2 10/19/2022   Lab Results  Component Value Date   MICRALBCREAT 1.9 05/20/2022   MICRALBCREAT 1.2 12/13/2020   Lab Results  Component Value Date   CREATININE 0.87 11/18/2022   Lab Results  Component Value Date   GFR 68.43 11/18/2022    ASSESSMENT / PLAN  1. Uncontrolled type 2 diabetes mellitus with hyperglycemia, without long-term current use of insulin (HCC)     Diabetes Mellitus type ***, complicated by *** - Diabetic status / severity: ***  Lab Results  Component Value Date   HGBA1C 6.6 (A) 05/03/2023    - Hemoglobin A1c goal : <***%  - Medications: ***  I) II) III) IV)  - Home glucose testing: *** - Discussed/ Gave Hypoglycemia treatment plan.  # Consult : not required at this time. ***  # Annual urine for microalbuminuria/ creatinine ratio, no microalbuminuria currently, continue ACE/ARB *** Last  Lab Results  Component Value Date   MICRALBCREAT 1.9 05/20/2022    # Foot check nightly / neuropathy, continue ***  # Annual dilated diabetic eye exams.   - Diet: {dmclinicdiabeticdiettype:42746::"Make healthy diabetic food choices","Eat reasonable portion sizes to promote a healthy weight"} - Life style / activity / exercise: {dmclinicactivity:42747}  2. Blood pressure  -  BP Readings from Last 1 Encounters:  05/03/23 134/70    - Control {IS/IS NOT:9024::"is"} in target.  - {endo rb hypertension recommendations:35631::"No change in current plans."}  3. Lipid status / Hyperlipidemia - Last  Lab Results  Component Value Date   LDLCALC 52 10/19/2022   - Continue ***   Diagnoses and all orders for this visit:  Uncontrolled type 2 diabetes mellitus with hyperglycemia, without long-term current use of insulin (HCC) -     POCT glycosylated hemoglobin (Hb  A1C)    DISPOSITION Follow up in clinic in   months suggested.   All questions answered and patient verbalized understanding of the plan.  [place]ra[place] Jahira Swiss, MD [facility] Endocrinology [facility] [address] [place], [address] [place], [place] [address] Phone # [phone]  At least part of this note was generated using voice recognition software. Inadvertent word errors may have occurred, which were not recognized during the proofreading process.

## 2023-05-04 ENCOUNTER — Encounter: Payer: Self-pay | Admitting: Endocrinology

## 2023-05-04 LAB — BASIC METABOLIC PANEL WITH GFR
BUN: 18 mg/dL (ref 7–25)
CO2: 30 mmol/L (ref 20–32)
Calcium: 9.6 mg/dL (ref 8.6–10.4)
Chloride: 102 mmol/L (ref 98–110)
Creat: 0.67 mg/dL (ref 0.50–1.05)
Glucose, Bld: 64 mg/dL — ABNORMAL LOW (ref 65–99)
Potassium: 4.4 mmol/L (ref 3.5–5.3)
Sodium: 141 mmol/L (ref 135–146)
eGFR: 95 mL/min/{1.73_m2} (ref >=60)

## 2023-05-04 LAB — MICROALBUMIN / CREATININE URINE RATIO
Creatinine, Urine: 44 mg/dL (ref 20–275)
Microalb Creat Ratio: 7 mg/g{creat} (ref <30)
Microalb, Ur: 0.3 mg/dL

## 2023-06-25 ENCOUNTER — Ambulatory Visit: Payer: Self-pay

## 2023-06-25 NOTE — Patient Outreach (Signed)
 Care Coordination   06/25/2023 Name: Marie [name] MRN: [id number] DOB: [birth year]   Care Coordination Outreach Attempts:  An unsuccessful outreach was attempted for an appointment today.  Follow Up Plan:  Additional outreach attempts will be made to offer the patient complex care management information and services.   Encounter Outcome:  No Answer   Care Coordination Interventions:  No, not indicated    Bary Leriche RN, MSN [facility]  [facility], [facility] RN Care Manager Direct Dial: [phone]  Fax: [phone] Website: [email or web address]

## 2023-06-29 ENCOUNTER — Telehealth: Payer: Self-pay

## 2023-06-29 NOTE — Patient Outreach (Signed)
 Care Coordination   06/29/2023 Name: Marie [name] MRN: [id number] DOB: Oct 15, 1955   Care Coordination Outreach Attempts:  A second unsuccessful outreach was attempted today to offer the patient with information about available complex care management services.  Follow Up Plan:  Additional outreach attempts will be made to offer the patient complex care management information and services.   Encounter Outcome:  No Answer   Care Coordination Interventions:  No, not indicated    Bary Leriche RN, MSN [facility]  [facility], [facility] RN Care Manager Direct Dial: [phone]  Fax: [phone] Website: [email or web address]

## 2023-06-30 ENCOUNTER — Telehealth: Payer: Self-pay | Admitting: Endocrinology

## 2023-06-30 DIAGNOSIS — E1165 Type 2 diabetes mellitus with hyperglycemia: Secondary | ICD-10-CM

## 2023-06-30 MED ORDER — FREESTYLE LIBRE 3 PLUS SENSOR MISC: 3 refills | Status: AC

## 2023-06-30 NOTE — Telephone Encounter (Signed)
 Done

## 2023-06-30 NOTE — Telephone Encounter (Signed)
 MEDICATION: FrfeeStyle Libre 3 Plus Sensor  PHARMACY:    [id number] - [place], [place] - [address] AT [facility] [address] (Ph: [phone])    HAS THE PATIENT CONTACTED THEIR PHARMACY?  Yes  IS THIS A 90 DAY SUPPLY : Yes  IS PATIENT OUT OF MEDICATION: No  IF NOT; HOW MUCH IS LEFT: 2  LAST APPOINTMENT DATE: @1 /27/2025  NEXT APPOINTMENT DATE:@5 /05/2023  DO WE HAVE YOUR PERMISSION TO LEAVE A DETAILED MESSAGE?: Patient states that the prescription that the pharmacy has now is for the FreeStyle Libre 3, but needs the 3 Plus  OTHER COMMENTS:    **Let patient know to contact pharmacy at the end of the day to make sure medication is ready. **  ** Please notify patient to allow 48-72 hours to process**  **Encourage patient to contact the pharmacy for refills or they can request refills through [facility]**

## 2023-07-01 ENCOUNTER — Telehealth: Payer: Self-pay

## 2023-07-01 NOTE — Patient Outreach (Signed)
 Care Coordination   07/01/2023 Name: Marie [name] MRN: [id number] DOB: 1955/08/27   Care Coordination Outreach Attempts:  A third unsuccessful outreach was attempted today to offer the patient with information about available complex care management services.  Follow Up Plan:  No further outreach attempts will be made at this time. We have been unable to contact the patient to offer or enroll patient in complex care management services.  Encounter Outcome:  No Answer   Care Coordination Interventions:  No, not indicated    Bary Leriche RN, MSN [facility]  [facility], [facility] RN Care Manager Direct Dial: [phone]  Fax: [phone] Website: [email or web address]

## 2023-07-02 ENCOUNTER — Encounter: Payer: Self-pay | Admitting: Podiatry

## 2023-07-02 ENCOUNTER — Telehealth: Payer: Self-pay | Admitting: *Deleted

## 2023-07-02 ENCOUNTER — Other Ambulatory Visit: Payer: Medicare PPO

## 2023-07-02 ENCOUNTER — Ambulatory Visit: Admitting: Podiatry

## 2023-07-02 DIAGNOSIS — L84 Corns and callosities: Secondary | ICD-10-CM

## 2023-07-02 DIAGNOSIS — M216X1 Other acquired deformities of right foot: Secondary | ICD-10-CM

## 2023-07-02 NOTE — Patient Instructions (Addendum)
 Choose a moisturizer from  the list below:  For normal skin: Moisturize feet once daily; do not apply between toes A.  CeraVe Daily Moisturizing Lotion B.  Lubriderm Advanced Therapy Lotion or Lubriderm Intense Skin Repair Lotion C.  Aquaphor Intensive Repair Lotion D.  Gold Bond Ultimate Diabetic Foot Lotion E.  Eucerin Intensive Repair Moisturizing Lotion  For extremely dry, cracked feet: moisturize feet once daily; do not apply between toes A. CeraVe Healing Ointment B. Eucerin Aquaphor Repairing Ointment (may be labeled Aquaphor Healing Ointment) C. Vaseline Petroleum Healing Jelly   If you have problems reaching your feet: apply to feet once daily; do not apply between toes A.  Eucerin Aquaphor Ointment Body Spray  B.  Vaseline Intensive Care Spray Moisturizer (Unscented,  Cocoa Radiant Spray or Aloe Smooth Spray)   ----  Diabetes Mellitus and Foot Care Diabetes, also called diabetes mellitus, may cause problems with your feet and legs because of poor blood flow (circulation). Poor circulation may make your skin: Become thinner and drier. Break more easily. Heal more slowly. Peel and crack. You may also have nerve damage (neuropathy). This can cause decreased feeling in your legs and feet. This means that you may not notice minor injuries to your feet that could lead to more serious problems. Finding and treating problems early is the best way to prevent future foot problems. How to care for your feet Foot hygiene  Wash your feet daily with warm water and mild soap. Do not use hot water. Then, pat your feet and the areas between your toes until they are fully dry. Do not soak your feet. This can dry your skin. Trim your toenails straight across. Do not dig under them or around the cuticle. File the edges of your nails with an emery board or nail file. Apply a moisturizing lotion or petroleum jelly to the skin on your feet and to dry, brittle toenails. Use lotion that does not  contain alcohol and is unscented. Do not apply lotion between your toes. Shoes and socks Wear clean socks or stockings every day. Make sure they are not too tight. Do not wear knee-high stockings. These may decrease blood flow to your legs. Wear shoes that fit well and have enough cushioning. Always look in your shoes before you put them on to be sure there are no objects inside. To break in new shoes, wear them for just a few hours a day. This prevents injuries on your feet. Wounds, scrapes, corns, and calluses  Check your feet daily for blisters, cuts, bruises, sores, and redness. If you cannot see the bottom of your feet, use a mirror or ask someone for help. Do not cut off corns or calluses or try to remove them with medicine. If you find a minor scrape, cut, or break in the skin on your feet, keep it and the skin around it clean and dry. You may clean these areas with mild soap and water. Do not clean the area with peroxide, alcohol, or iodine. If you have a wound, scrape, corn, or callus on your foot, look at it several times a day to make sure it is healing and not infected. Check for: Redness, swelling, or pain. Fluid or blood. Warmth. Pus or a bad smell. General tips Do not cross your legs. This may decrease blood flow to your feet. Do not use heating pads or hot water bottles on your feet. They may burn your skin. If you have lost feeling in your feet  or legs, you may not know this is happening until it is too late. Protect your feet from hot and cold by wearing shoes, such as at the beach or on hot pavement. Schedule a complete foot exam at least once a year or more often if you have foot problems. Report any cuts, sores, or bruises to your health care provider right away. Where to find more information American Diabetes Association: diabetes.org Association of Diabetes Care & Education Specialists: diabeteseducator.org Contact a health care provider if: You have a condition that  increases your risk of infection, and you have any cuts, sores, or bruises on your feet. You have an injury that is not healing. You have redness on your legs or feet. You feel burning or tingling in your legs or feet. You have pain or cramps in your legs and feet. Your legs or feet are numb. Your feet always feel cold. You have pain around any toenails. Get help right away if: You have a wound, scrape, corn, or callus on your foot and: You have signs of infection. You have a fever. You have a red line going up your leg. This information is not intended to replace advice given to you by your health care provider. Make sure you discuss any questions you have with your health care provider. Document Revised: 09/24/2021 Document Reviewed: 09/24/2021 Elsevier Patient Education  2024 [employer].

## 2023-07-02 NOTE — Progress Notes (Signed)
 Complex Care Management Note Care Guide Note  07/02/2023 Name: Marie [name] MRN: [id number] DOB: 13-Apr-1955  Marie [name] is a 68 y.o. year old female who is a primary care patient of Mliss Sax, MD . The community resource team was consulted for assistance with Transportation Needs   SDOH screenings and interventions completed:  Yes  Social Drivers of Health From This Encounter   Transportation Needs: No Transportation Needs (07/02/2023)   PRAPARE - Transportation    Lack of Transportation (Medical): No    Lack of Transportation (Non-Medical): No    SDOH Interventions Today    Flowsheet Row Most Recent Value  SDOH Interventions   Transportation Interventions Other (Comment)  [patient called this am as she has had some issues with [place] access transportation , called to help resolve the caregiver sitautiona nd to help her understand fees]        Care guide performed the following interventions:  patient had previously had transportation through [facility] so she reached out to see if I could help her get listed as a caregiver to ride access with her son for no charge we called together and left message for ADA coordinator Ms Luanna Salk to call back patient  .  Follow Up Plan:  No further follow up planned at this time. The patient has been provided with needed resources.  Encounter Outcome:  Patient Visit Completed Dione Booze  [facility] HealthPopulation Health Care Guide  Direct Dial:[phone] [phone] Website: [email or web address]

## 2023-07-05 NOTE — Progress Notes (Signed)
  Subjective:  Patient ID: Marie [name], female    DOB: Nov 08, 1955,  MRN: [id number]  Chief Complaint  Patient presents with   Callouses    RM#11 Right foot callus causing pain present 6 months.    Discussed the use of AI scribe software for clinical note transcription with the patient, who gave verbal consent to proceed.  History of Present Illness The patient presents with a painful callus on the right foot that has made walking difficult. She denies any known injury to the foot and this is her first treatment for the issue. She has been careful to always wear socks to avoid cuts or injuries. She has a known allergy to penicillin but reports no other known allergies. She has a history of food allergies, but believes these have resolved with age. She has not had any previous wounds on her feet that did not heal. Her A1c was checked on January 27th and was reported as good at 6.6. She was referred to the podiatrist by her primary care physician due to the painful callus.      Objective:    Physical Exam General: AAO x3, NAD  Dermatological: Hyperkeratotic lesion noted plantar aspect of the right foot without any underlying ulceration, drainage or signs of infection.  No open lesion identified bilaterally.  Vascular: Dorsalis Pedis artery and Posterior Tibial artery pedal pulses are palpable bilateral with immedate capillary fill time. . There is no pain with calf compression, swelling, warmth, erythema.   Neruologic: Grossly intact via light touch bilateral.   Musculoskeletal: Prominent metatarsal heads plantarly with atrophy of the fat pad.  Tenderness hyperkeratotic lesion.  No other areas of discomfort. Gait: Unassisted, Nonantalgic.     No images are attached to the encounter.    Results  LABS A1c: 6.6 (05/03/2023)   Assessment:   1. Callus      Plan:  Patient was evaluated and treated and all questions answered.  Assessment and Plan Assessment & Plan Painful  calluses on the right foot Painful calluses under the third and fourth metatarsal heads due to loss of fat padding. -Sharply debrided the hyperkeratotic lesion without any complications or bleeding.  Discussed moisturizer, offloading. - Provide gel pads to alleviate pressure. - Recommend moisturizers for skin health.  Diabetes mellitus with controlled A1c Diabetes well-controlled with A1c of 6.6%. - Monitor A1c levels regularly. - Daily foot inspection.   Return if symptoms worsen or fail to improve.   Vivi Barrack DPM

## 2023-07-06 ENCOUNTER — Other Ambulatory Visit: Payer: Self-pay | Admitting: Family Medicine

## 2023-07-06 DIAGNOSIS — H35371 Puckering of macula, right eye: Secondary | ICD-10-CM | POA: Diagnosis not present

## 2023-07-06 DIAGNOSIS — H26491 Other secondary cataract, right eye: Secondary | ICD-10-CM | POA: Diagnosis not present

## 2023-07-06 DIAGNOSIS — H35021 Exudative retinopathy, right eye: Secondary | ICD-10-CM | POA: Diagnosis not present

## 2023-07-06 DIAGNOSIS — E113511 Type 2 diabetes mellitus with proliferative diabetic retinopathy with macular edema, right eye: Secondary | ICD-10-CM | POA: Diagnosis not present

## 2023-07-06 DIAGNOSIS — E113412 Type 2 diabetes mellitus with severe nonproliferative diabetic retinopathy with macular edema, left eye: Secondary | ICD-10-CM | POA: Diagnosis not present

## 2023-07-06 DIAGNOSIS — E7800 Pure hypercholesterolemia, unspecified: Secondary | ICD-10-CM

## 2023-07-06 DIAGNOSIS — H2512 Age-related nuclear cataract, left eye: Secondary | ICD-10-CM | POA: Diagnosis not present

## 2023-07-06 LAB — HM DIABETES EYE EXAM

## 2023-07-30 ENCOUNTER — Telehealth: Payer: Self-pay | Admitting: Family Medicine

## 2023-07-30 NOTE — Telephone Encounter (Signed)
 Please advise pt at [phone], she is wondering when she should do her cologuard test for this year. She would also like to know if it's time for her pna shot.

## 2023-08-06 ENCOUNTER — Ambulatory Visit: Payer: Medicare PPO | Admitting: Endocrinology

## 2023-08-12 DIAGNOSIS — H35372 Puckering of macula, left eye: Secondary | ICD-10-CM | POA: Diagnosis not present

## 2023-08-12 DIAGNOSIS — E113413 Type 2 diabetes mellitus with severe nonproliferative diabetic retinopathy with macular edema, bilateral: Secondary | ICD-10-CM | POA: Diagnosis not present

## 2023-08-12 DIAGNOSIS — H26491 Other secondary cataract, right eye: Secondary | ICD-10-CM | POA: Diagnosis not present

## 2023-08-12 DIAGNOSIS — Z961 Presence of intraocular lens: Secondary | ICD-10-CM | POA: Diagnosis not present

## 2023-08-12 DIAGNOSIS — H25812 Combined forms of age-related cataract, left eye: Secondary | ICD-10-CM | POA: Diagnosis not present

## 2023-08-22 ENCOUNTER — Other Ambulatory Visit: Payer: Self-pay | Admitting: Family Medicine

## 2023-08-22 DIAGNOSIS — F341 Dysthymic disorder: Secondary | ICD-10-CM

## 2023-08-23 NOTE — Telephone Encounter (Signed)
 Copied from CRM [id number]. Topic: Clinical - Medication Refill >> Aug 23, 2023  8:44 AM Dimple Francis wrote: Medication: escitalopram  (LEXAPRO ) 10 MG tablet  Has the patient contacted their pharmacy? Yes (Agent: If no, request that the patient contact the pharmacy for the refill. If patient does not wish to contact the pharmacy document the reason why and proceed with request.) (Agent: If yes, when and what did the pharmacy advise?)  This is the patient's preferred pharmacy:  [id number] - [place], [place] - [address] [place] [address] Phone: [phone] Fax: [phone]  Is this the correct pharmacy for this prescription? Yes If no, delete pharmacy and type the correct one.   Has the prescription been filled recently? Yes  Is the patient out of the medication? Yes  Has the patient been seen for an appointment in the last year OR does the patient have an upcoming appointment? Yes  Can we respond through MyChart? Yes  Agent: Please be advised that Rx refills may take up to 3 business days. We ask that you follow-up with your pharmacy.

## 2023-08-23 NOTE — Telephone Encounter (Signed)
 Copied from CRM [id number]. Topic: Clinical - Medical Advice >> Aug 23, 2023  8:42 AM Dimple Francis wrote: Reason for CRM: patient wanting to know if she needs to have another Cologuard test also wanting to schedule for a bone density test. Please reach out to patient as soon as possible.

## 2023-08-28 ENCOUNTER — Other Ambulatory Visit: Payer: Self-pay | Admitting: Neurology

## 2023-09-01 ENCOUNTER — Other Ambulatory Visit: Payer: Self-pay

## 2023-09-01 ENCOUNTER — Other Ambulatory Visit (HOSPITAL_BASED_OUTPATIENT_CLINIC_OR_DEPARTMENT_OTHER): Payer: Self-pay

## 2023-09-01 MED ORDER — TRAZODONE HCL 50 MG PO TABS: 50.0000 mg | ORAL_TABLET | Freq: Every day | ORAL | 3 refills | Status: DC

## 2023-09-03 DIAGNOSIS — H25812 Combined forms of age-related cataract, left eye: Secondary | ICD-10-CM | POA: Diagnosis not present

## 2023-09-06 ENCOUNTER — Ambulatory Visit: Admitting: Dermatology

## 2023-09-13 ENCOUNTER — Ambulatory Visit: Admitting: Podiatry

## 2023-09-27 DIAGNOSIS — H40053 Ocular hypertension, bilateral: Secondary | ICD-10-CM | POA: Diagnosis not present

## 2023-09-27 DIAGNOSIS — H40013 Open angle with borderline findings, low risk, bilateral: Secondary | ICD-10-CM | POA: Diagnosis not present

## 2023-10-01 ENCOUNTER — Ambulatory Visit: Admitting: Podiatry

## 2023-10-11 ENCOUNTER — Encounter: Payer: Self-pay | Admitting: Family Medicine

## 2023-10-11 ENCOUNTER — Ambulatory Visit: Admitting: Family Medicine

## 2023-10-11 ENCOUNTER — Ambulatory Visit: Payer: Self-pay | Admitting: Family Medicine

## 2023-10-11 ENCOUNTER — Telehealth: Payer: Self-pay

## 2023-10-11 VITALS — BP 136/76 | HR 95 | Temp 96.6°F | Ht 61.0 in | Wt 136.2 lb

## 2023-10-11 DIAGNOSIS — Z23 Encounter for immunization: Secondary | ICD-10-CM | POA: Diagnosis not present

## 2023-10-11 DIAGNOSIS — E559 Vitamin D deficiency, unspecified: Secondary | ICD-10-CM | POA: Diagnosis not present

## 2023-10-11 DIAGNOSIS — E538 Deficiency of other specified B group vitamins: Secondary | ICD-10-CM | POA: Diagnosis not present

## 2023-10-11 DIAGNOSIS — E7800 Pure hypercholesterolemia, unspecified: Secondary | ICD-10-CM | POA: Diagnosis not present

## 2023-10-11 DIAGNOSIS — F341 Dysthymic disorder: Secondary | ICD-10-CM | POA: Diagnosis not present

## 2023-10-11 DIAGNOSIS — I10 Essential (primary) hypertension: Secondary | ICD-10-CM

## 2023-10-11 DIAGNOSIS — F419 Anxiety disorder, unspecified: Secondary | ICD-10-CM | POA: Diagnosis not present

## 2023-10-11 DIAGNOSIS — Z780 Asymptomatic menopausal state: Secondary | ICD-10-CM | POA: Diagnosis not present

## 2023-10-11 LAB — URINALYSIS, ROUTINE W REFLEX MICROSCOPIC
Bilirubin Urine: NEGATIVE
Hgb urine dipstick: NEGATIVE
Ketones, ur: NEGATIVE
Leukocytes,Ua: NEGATIVE
Nitrite: NEGATIVE
Specific Gravity, Urine: 1.025 (ref 1.000–1.030)
Total Protein, Urine: NEGATIVE
Urine Glucose: NEGATIVE
Urobilinogen, UA: 0.2 (ref 0.0–1.0)
pH: 6 (ref 5.0–8.0)

## 2023-10-11 LAB — LIPID PANEL
Cholesterol: 131 mg/dL (ref 0–200)
HDL: 51.8 mg/dL (ref >39.00)
LDL Cholesterol: 59 mg/dL (ref 0–99)
NonHDL: 78.9
Total CHOL/HDL Ratio: 3
Triglycerides: 101 mg/dL (ref 0.0–149.0)
VLDL: 20.2 mg/dL (ref 0.0–40.0)

## 2023-10-11 LAB — COMPREHENSIVE METABOLIC PANEL WITH GFR
ALT: 23 U/L (ref 0–35)
AST: 20 U/L (ref 0–37)
Albumin: 4.3 g/dL (ref 3.5–5.2)
Alkaline Phosphatase: 54 U/L (ref 39–117)
BUN: 11 mg/dL (ref 6–23)
CO2: 32 meq/L (ref 19–32)
Calcium: 9.1 mg/dL (ref 8.4–10.5)
Chloride: 103 meq/L (ref 96–112)
Creatinine, Ser: 0.74 mg/dL (ref 0.40–1.20)
GFR: 82.58 mL/min (ref >60.00)
Glucose, Bld: 118 mg/dL — ABNORMAL HIGH (ref 70–99)
Potassium: 3.8 meq/L (ref 3.5–5.1)
Sodium: 142 meq/L (ref 135–145)
Total Bilirubin: 0.6 mg/dL (ref 0.2–1.2)
Total Protein: 7.1 g/dL (ref 6.0–8.3)

## 2023-10-11 LAB — CBC WITH DIFFERENTIAL/PLATELET
Basophils Absolute: 0.1 K/uL (ref 0.0–0.1)
Basophils Relative: 0.8 % (ref 0.0–3.0)
Eosinophils Absolute: 0.1 K/uL (ref 0.0–0.7)
Eosinophils Relative: 1.6 % (ref 0.0–5.0)
HCT: 39.3 % (ref 36.0–46.0)
Hemoglobin: 12.9 g/dL (ref 12.0–15.0)
Lymphocytes Relative: 30.9 % (ref 12.0–46.0)
Lymphs Abs: 2.3 K/uL (ref 0.7–4.0)
MCHC: 32.9 g/dL (ref 30.0–36.0)
MCV: 87.5 fl (ref 78.0–100.0)
Monocytes Absolute: 0.5 K/uL (ref 0.1–1.0)
Monocytes Relative: 6.5 % (ref 3.0–12.0)
Neutro Abs: 4.5 K/uL (ref 1.4–7.7)
Neutrophils Relative %: 60.2 % (ref 43.0–77.0)
Platelets: 185 K/uL (ref 150.0–400.0)
RBC: 4.49 Mil/uL (ref 3.87–5.11)
RDW: 14.1 % (ref 11.5–15.5)
WBC: 7.5 K/uL (ref 4.0–10.5)

## 2023-10-11 LAB — VITAMIN B12: Vitamin B-12: 267 pg/mL (ref 211–911)

## 2023-10-11 LAB — VITAMIN D 25 HYDROXY (VIT D DEFICIENCY, FRACTURES): VITD: 30.64 ng/mL (ref 30.00–100.00)

## 2023-10-11 MED ORDER — VALSARTAN 80 MG PO TABS: 80.0000 mg | ORAL_TABLET | Freq: Two times a day (BID) | ORAL | 1 refills | Status: AC

## 2023-10-11 MED ORDER — ESCITALOPRAM OXALATE 10 MG PO TABS: 10.0000 mg | ORAL_TABLET | Freq: Every day | ORAL | 3 refills | Status: AC

## 2023-10-11 NOTE — Telephone Encounter (Unsigned)
 Copied from CRM [id number]. Topic: General - Other >> Oct 11, 2023  2:53 PM Mesmerise C wrote: Reason for CRM: Patient inquiring about getting a shingles vaccine has had shingles in the past and looking to know if she needs to get more vaccines or is she up to date on her shot can be reached at [phone]

## 2023-10-11 NOTE — Progress Notes (Signed)
 Established Patient Office Visit   Subjective:  Patient ID: Marie [name], female    DOB: 12/10/1955  Age: 68 y.o. MRN: [id number]  Chief Complaint  Patient presents with   Annual Exam    CPE. Pt is not fasting. Pt reports cataract surgery on her left eye with Dr. Octavia.     HPI Encounter Diagnoses  Name Primary?   Essential hypertension Yes   Immunization due    Postmenopausal estrogen deficiency    Elevated LDL cholesterol level    B12 deficiency    Vitamin D  deficiency    Dysthymia    Anxiety    For follow-up of above.  Continues medications as listed below.  She has been out of her escitalopram .  She is most worried about coverage for herself and her disabled son with the current political climate.  Diabetes care is through endocrinology.   Review of Systems  Constitutional: Negative.   HENT: Negative.    Eyes:  Negative for blurred vision, discharge and redness.  Respiratory: Negative.    Cardiovascular: Negative.   Gastrointestinal:  Negative for abdominal pain.  Genitourinary: Negative.   Musculoskeletal: Negative.  Negative for myalgias.  Skin:  Negative for rash.  Neurological:  Negative for tingling, loss of consciousness and weakness.  Endo/Heme/Allergies:  Negative for polydipsia.     Current Outpatient Medications:    ascorbic acid (VITAMIN C) 500 MG tablet, Take 500 mg by mouth daily., Disp: , Rfl:    atorvastatin  (LIPITOR) 80 MG tablet, TAKE 1 TABLET BY MOUTH ONCE DAILY, Disp: 90 tablet, Rfl: 3   Cholecalciferol (VITAMIN D3) 3000 units TABS, Take 3,000 Units by mouth at bedtime., Disp: , Rfl:    Continuous Glucose Sensor (FREESTYLE LIBRE 3 PLUS SENSOR) MISC, Change sensor every 15 days., Disp: 6 each, Rfl: 3   Continuous Glucose Sensor (FREESTYLE LIBRE 3 SENSOR) MISC, Place 1 sensor on the skin every 14 days. Use to check glucose continuously, Disp: 6 each, Rfl: 3   cyanocobalamin  2000 MCG tablet, Take 2,000 mcg by mouth daily., Disp: , Rfl:     escitalopram  (LEXAPRO ) 10 MG tablet, TAKE 1 TABLET(10 MG) BY MOUTH DAILY, Disp: 90 tablet, Rfl: 1   escitalopram  (LEXAPRO ) 10 MG tablet, Take 1 tablet (10 mg total) by mouth daily., Disp: 90 tablet, Rfl: 3   metFORMIN  (GLUCOPHAGE -XR) 500 MG 24 hr tablet, TAKE 1 TABLET IN THE MORNING AND 1 tabs  with supper, Disp: 180 tablet, Rfl: 3   Multiple Vitamin (MULTIVITAMIN WITH MINERALS) TABS tablet, Take 1 tablet by mouth daily., Disp: , Rfl:    repaglinide  (PRANDIN ) 2 MG tablet, TAKE 1 TABLET BEFORE BREAKFAST, TAKE 1 TABLET AT LUNCH, AND TAKE 1 TABLET BEFORE DINNER, Disp: 270 tablet, Rfl: 3   Safety Seal Miscellaneous MISC, Apply 1 Application topically daily. Medication Name: AA Gel, Disp: 45 g, Rfl: 4   traZODone  (DESYREL ) 50 MG tablet, Take 1 tablet (50 mg total) by mouth at bedtime., Disp: 90 tablet, Rfl: 3   valsartan  (DIOVAN ) 80 MG tablet, Take 1 tablet (80 mg total) by mouth 2 (two) times daily., Disp: 180 tablet, Rfl: 1   Objective:     BP 136/76 (Cuff Size: Normal)   Pulse 95   Temp (!) 96.6 F (35.9 C) (Temporal)   Ht 5' 1 (1.549 m)   Wt 136 lb 3.2 oz (61.8 kg)   SpO2 96%   BMI 25.73 kg/m  BP Readings from Last 3 Encounters:  10/11/23 136/76  05/03/23 134/70  02/23/23 138/72   Wt Readings from Last 3 Encounters:  10/11/23 136 lb 3.2 oz (61.8 kg)  05/03/23 132 lb 12.8 oz (60.2 kg)  02/23/23 127 lb 9.6 oz (57.9 kg)      Physical Exam Constitutional:      General: She is not in acute distress.    Appearance: Normal appearance. She is not ill-appearing, toxic-appearing or diaphoretic.  HENT:     Head: Normocephalic and atraumatic.     Right Ear: External ear normal.     Left Ear: External ear normal.     Mouth/Throat:     Mouth: Mucous membranes are moist.     Pharynx: Oropharynx is clear. No oropharyngeal exudate or posterior oropharyngeal erythema.  Eyes:     General: No scleral icterus.       Right eye: No discharge.        Left eye: No discharge.     Extraocular  Movements: Extraocular movements intact.     Conjunctiva/sclera: Conjunctivae normal.     Pupils: Pupils are equal, round, and reactive to light.  Cardiovascular:     Rate and Rhythm: Normal rate and regular rhythm.     Pulses:          Dorsalis pedis pulses are 1+ on the right side and 1+ on the left side.       Posterior tibial pulses are 1+ on the right side and 1+ on the left side.  Pulmonary:     Effort: Pulmonary effort is normal. No respiratory distress.     Breath sounds: Normal breath sounds. No wheezing or rales.  Musculoskeletal:     Cervical back: No rigidity or tenderness.  Skin:    General: Skin is warm and dry.  Neurological:     Mental Status: She is alert and oriented to person, place, and time.  Psychiatric:        Mood and Affect: Mood normal.        Behavior: Behavior normal.    Diabetic Foot Exam - Simple   Simple Foot Form Diabetic Foot exam was performed with the following findings: Yes 10/11/2023  9:44 AM  Visual Inspection See comments: Yes Sensation Testing Intact to touch and monofilament testing bilaterally: Yes Pulse Check Posterior Tibialis and Dorsalis pulse intact bilaterally: Yes Comments Feet are cavus bilaterally.  There are no lesions or rashes.       No results found for any visits on 10/11/23.    The ASCVD Risk score (Arnett DK, et al., 2019) failed to calculate for the following reasons:   Risk score cannot be calculated because patient has a medical history suggesting prior/existing ASCVD    Assessment & Plan:   Essential hypertension -     CBC with Differential/Platelet -     Comprehensive metabolic panel with GFR -     Urinalysis, Routine w reflex microscopic -     Valsartan ; Take 1 tablet (80 mg total) by mouth 2 (two) times daily.  Dispense: 180 tablet; Refill: 1  Immunization due -     Pneumococcal conjugate vaccine 20-valent  Postmenopausal estrogen deficiency -     DG Bone Density; Future  Elevated LDL cholesterol  level -     Comprehensive metabolic panel with GFR -     Lipid panel  B12 deficiency -     Vitamin B12  Vitamin D  deficiency -     VITAMIN D  25 Hydroxy (Vit-D Deficiency, Fractures)  Dysthymia -     Escitalopram  Oxalate;  Take 1 tablet (10 mg total) by mouth daily.  Dispense: 90 tablet; Refill: 3  Anxiety -     Escitalopram  Oxalate; Take 1 tablet (10 mg total) by mouth daily.  Dispense: 90 tablet; Refill: 3    Return in about 3 months (around 01/11/2024).    Elsie Sim Lent, MD

## 2023-10-11 NOTE — Telephone Encounter (Signed)
 Copied from CRM [id number]. Topic: General - Other >> Oct 11, 2023  2:53 PM Mesmerise C wrote: Reason for CRM: Patient inquiring about getting a shingles vaccine has had shingles in the past and looking to know if she needs to get more vaccines or is she up to date on her shot can be reached at [phone]

## 2023-10-13 ENCOUNTER — Ambulatory Visit: Admitting: Obstetrics & Gynecology

## 2023-10-14 ENCOUNTER — Telehealth: Payer: Self-pay | Admitting: Family Medicine

## 2023-10-14 NOTE — Telephone Encounter (Signed)
 An IT ticket has been placed to be able to see the form from 2023 she is wanting a copy of.   She is also wanting a cologard order placed. She call her insu and was told Dr Berneta ordered it the last time she had it. She is needing clarification on how to get a cologard screening done.   I did scheduled her a bone density exam.

## 2023-10-14 NOTE — Telephone Encounter (Signed)
 Copied from CRM [id number]. Topic: Complaint (DO NOT CONVERT) - Provider (sensitive) >> Oct 14, 2023  2:51 PM Chasity T wrote: Date of Incident: 10/11/2023 Details of complaint: patient is stating when going to her recent visit for a physical she was told that she could not get a colon screening done because it was not last done there and they have no records. She states that she had to call her insurance to see when it was last done and who did it. She also states that every time she comes to the office there is confusion and she is not happy with the service provided all together. She feels as though as a paid patient she needs to get any services as requested. How would the patient like to see it resolved? She wants to speak with a supervisor for complaint of office  On a scale of 1-10, how was your experience? 5 What would it take to bring it to a 10?  Better service for patient  Route to Research officer, political party.

## 2023-10-15 NOTE — Telephone Encounter (Signed)
 Copied from CRM [id number]. Topic: General - Other >> Oct 14, 2023  1:09 PM Mesmerise C wrote: Reason for CRM: Ozell from [facility] stated that patient said doctor told her she would have to check with insurance about a colon cancer screening [employer] stated the doctor would have that knowledgeof that since Medicare covers the annual screening if patient can get call back [phone] to be advised

## 2023-10-15 NOTE — Telephone Encounter (Unsigned)
 Copied from CRM [id number]. Topic: Referral - Status >> Oct 14, 2023 11:40 AM Revonda D wrote: Reason for CRM: Pt is calling to check the status of her referral for Bone Density that was supposed to be created on 7/7. Pt stated that this is her third time calling and was told that someone would call her back and hasn't received a callback yet. Pt is wanting someone fro the office to give her a callback to confirm that status of her referral today.

## 2023-10-18 ENCOUNTER — Telehealth: Payer: Self-pay

## 2023-10-18 DIAGNOSIS — H35372 Puckering of macula, left eye: Secondary | ICD-10-CM | POA: Diagnosis not present

## 2023-10-18 DIAGNOSIS — E113413 Type 2 diabetes mellitus with severe nonproliferative diabetic retinopathy with macular edema, bilateral: Secondary | ICD-10-CM | POA: Diagnosis not present

## 2023-10-18 DIAGNOSIS — Z961 Presence of intraocular lens: Secondary | ICD-10-CM | POA: Diagnosis not present

## 2023-10-18 DIAGNOSIS — H26491 Other secondary cataract, right eye: Secondary | ICD-10-CM | POA: Diagnosis not present

## 2023-10-18 NOTE — Telephone Encounter (Signed)
 complete

## 2023-10-18 NOTE — Telephone Encounter (Signed)
LMOV  

## 2023-10-18 NOTE — Telephone Encounter (Signed)
 Copied from CRM [id number]. Topic: Clinical - Request for Lab/Test Order >> Oct 18, 2023 11:57 AM Armenia J wrote: Reason for CRM: Patient would like to know if she could have a cologuard test ordered. >> Oct 18, 2023  1:03 PM CMA Lila C wrote: Wrong office

## 2023-10-19 ENCOUNTER — Telehealth: Payer: Self-pay | Admitting: Family Medicine

## 2023-10-19 ENCOUNTER — Inpatient Hospital Stay: Admission: RE | Admit: 2023-10-19 | Source: Ambulatory Visit

## 2023-10-19 NOTE — Telephone Encounter (Signed)
 Pt came into office approx 8:45a  1- Pt asked when she was due for cologuard and said at last appointment she asked and was told to call cologuard. Pt called and was told last cologuard 12/26/2020 ordered by Dr. Berneta.  2- Pt asked if she is able to get lab work drawn at [employer] when ordered by [facility]-Dr. Mercie.  ---------------------------------------  Reviewed chart & called pt approx 9:45a  1- Advised pt we have the past order and negative result in our system from 12/2020 cologuard testing. Pt due every 3 years. Future appt scheduled for 01/10/2024 with Dr. Berneta. Pt will ask for cologuard to be ordered at that time.  2- Advised pt that as long as Dr. Mercie has entered future lab orders we can release & draw at [facility] and results will go to Dr. Mercie.

## 2023-10-20 ENCOUNTER — Ambulatory Visit: Admitting: Physician Assistant

## 2023-10-27 ENCOUNTER — Ambulatory Visit: Admitting: Obstetrics and Gynecology

## 2023-10-29 ENCOUNTER — Inpatient Hospital Stay: Admission: RE | Admit: 2023-10-29 | Source: Ambulatory Visit

## 2023-11-01 ENCOUNTER — Ambulatory Visit: Admitting: Podiatry

## 2023-11-01 ENCOUNTER — Ambulatory Visit: Admitting: Obstetrics & Gynecology

## 2023-11-01 DIAGNOSIS — D2371 Other benign neoplasm of skin of right lower limb, including hip: Secondary | ICD-10-CM | POA: Diagnosis not present

## 2023-11-01 NOTE — Patient Instructions (Signed)
 Keep the bandage on for 24 hours. At that time, remove and clean with soap and water. If it hurts or burns before 24 hours go ahead and remove the bandage and wash with soap and water. Keep the area clean. If there is any blistering cover with antibiotic ointment and a bandage. Monitor for any redness, drainage, or other signs of infection. Call the office if any are to occur. If you have any questions, please call the office at [phone].

## 2023-11-02 ENCOUNTER — Ambulatory Visit: Admitting: Obstetrics & Gynecology

## 2023-11-03 NOTE — Progress Notes (Signed)
  Subjective:  Patient ID: Marie [name], female    DOB: 1955/11/01,  MRN: [id number]  Chief Complaint  Patient presents with   Callouses    Painful corn on right foot     History of Present Illness The patient presents with a painful recurrent callus to the bottom of her right foot.  She said it felt better after I trimmed the last appointment but the symptoms have returned.  She does not report any swelling, redness or any drainage.  No open lesions.  No other treatment.  No other concerns.       Objective:    Physical Exam General: AAO x3, NAD  Dermatological: Hyperkeratotic lesion noted plantar aspect of the right foot without any underlying ulceration, drainage or signs of infection.  No open lesion identified bilaterally.  Vascular: Dorsalis Pedis artery and Posterior Tibial artery pedal pulses are palpable bilateral with immedate capillary fill time. . There is no pain with calf compression, swelling, warmth, erythema.   Neruologic: Grossly intact via light touch bilateral.   Musculoskeletal: Prominent metatarsal heads plantarly with atrophy of the fat pad.    LABS A1c: 6.6 (05/03/2023)   Assessment:   Skin lesion right foot    Plan:  Patient was evaluated and treated and all questions answered.  Assessment and Plan Assessment & Plan -Sharply debrided the skin lesion daily any complications or bleeding.  Cleansed skin with alcohol.  Salicylic acid was placed followed by dressing.  Postprocedure instructions discussed.  Monitor for any signs or symptoms of infection. -Moisturizer, offloading.  Return if symptoms worsen or fail to improve.  Donnice JONELLE Fees DPM

## 2023-11-05 ENCOUNTER — Ambulatory Visit (INDEPENDENT_AMBULATORY_CARE_PROVIDER_SITE_OTHER)
Admission: RE | Admit: 2023-11-05 | Discharge: 2023-11-05 | Disposition: A | Discharge status: Home / Self Care | Source: Ambulatory Visit | Attending: Family Medicine | Admitting: Family Medicine

## 2023-11-05 DIAGNOSIS — Z780 Asymptomatic menopausal state: Secondary | ICD-10-CM | POA: Diagnosis not present

## 2023-11-11 NOTE — Telephone Encounter (Unsigned)
 Copied from CRM [id number]. Topic: Clinical - Lab/Test Results >> Nov 11, 2023  3:49 PM Carlyon D wrote: Reason for CRM: Pt is calling in regards to her bone density test. Pt would like some one to reach out to her and go over the results with her and let her next steps.

## 2023-11-12 ENCOUNTER — Telehealth: Payer: Self-pay

## 2023-11-12 NOTE — Telephone Encounter (Signed)
 Copied from CRM [id number]. Topic: Clinical - Lab/Test Results >> Nov 11, 2023  3:49 PM Carlyon D wrote: Reason for CRM: Pt is calling in regards to her bone density test. Pt would like some one to reach out to her and go over the results with her and let her next steps. >> Nov 12, 2023  1:24 PM Franky GRADE wrote: Patient is calling to follow up on the request to have a call for her bone density results.

## 2023-11-12 NOTE — Telephone Encounter (Unsigned)
 Copied from CRM [id number]. Topic: Clinical - Lab/Test Results >> Nov 12, 2023  4:06 PM Melissa C wrote: Reason for CRM: patient returning call regarding bone density scan/results. Apologizes for missing call, she was gardening. Please advise patient with return call.

## 2023-11-15 ENCOUNTER — Ambulatory Visit: Admitting: Endocrinology

## 2023-11-15 ENCOUNTER — Ambulatory Visit: Payer: Self-pay | Admitting: Endocrinology

## 2023-11-15 ENCOUNTER — Encounter: Payer: Self-pay | Admitting: Endocrinology

## 2023-11-15 VITALS — BP 120/80 | HR 90 | Resp 16 | Ht 61.0 in | Wt 135.6 lb

## 2023-11-15 DIAGNOSIS — Z7984 Long term (current) use of oral hypoglycemic drugs: Secondary | ICD-10-CM | POA: Diagnosis not present

## 2023-11-15 DIAGNOSIS — E1165 Type 2 diabetes mellitus with hyperglycemia: Secondary | ICD-10-CM | POA: Diagnosis not present

## 2023-11-15 LAB — POCT GLYCOSYLATED HEMOGLOBIN (HGB A1C): Hemoglobin A1C: 6.9 % — AB (ref 4.0–5.6)

## 2023-11-15 NOTE — Progress Notes (Signed)
 Outpatient Endocrinology Note Marie Leanza Shepperson, MD   Patient's Name: Marie [name]    DOB: 1955/06/11    MRN: [id number]                                                    REASON OF VISIT: Follow up for type 2 diabetes mellitus  PCP: Marie Elsie Sayre, MD  HISTORY OF PRESENT ILLNESS:   Marie [name] is a 68 y.o. old female with past medical history listed below, is here for follow up for type 2 diabetes mellitus.   Pertinent Diabetes History: Patient was previously seen by Dr. Von and was last time seen in August 2024.  Patient was diagnosed with type 2 diabetes mellitus in 2010.  Patient has relatively controlled type 2 diabetes mellitus.  Chronic Diabetes Complications : Retinopathy: yes, proliferative retinopathy. Last ophthalmology exam was done on annually, following with ophthalmology / retinal surgery regularly.  Nephropathy: no, on ACE/ARB /valsartan . Peripheral neuropathy: ?,  Callus present, following with podiatry. Coronary artery disease: no Stroke: yes  Relevant comorbidities and cardiovascular risk factors: Obesity: on Body mass index is 25.62 kg/m.  Hypertension: Yes  Hyperlipidemia : Yes, on statin   Current / Home Diabetic regimen includes:  Non-insulin  hypoglycemic drugs the patient is taking are: Metformin  extended release 500 mg 2 times a day, Prandin  4-0-2 mg before meals.    Prior diabetic medications: Farxiga  stopped due to cost.  She had taken Januvia , Rybelsus  in the past.  Glycemic data:    CONTINUOUS GLUCOSE MONITORING SYSTEM (CGMS) INTERPRETATION: At today's visit, we reviewed CGM downloads. The full report is scanned in the media. Reviewing the CGM trends, blood glucose are as follows:  FreeStyle Libre 3+ CGM-  Sensor Download (Sensor download was reviewed and summarized below.) Dates: July 29 to November 15, 2023, 14 days   Glucose Management Indicator: 6.6%  % data captured: 96%    Interpretation: Mostly acceptable blood sugars.   Occasional mild hypoglycemia with blood sugar in 60s especially after breakfast around early afternoon and rarely around bedtime.  Reviewed mild hyperglycemia with blood sugar 250s.  Hypoglycemia: Patient has minor hypoglycemic episodes. Patient has hypoglycemia awareness.  Factors modifying glucose control: 1.  Diabetic diet assessment: 3 meals a day.  2.  Staying active or exercising:   3.  Medication compliance: compliant all of the time.  Interval history  Diabetes regimen as reviewed above.  Hemoglobin A1c 6.9%.  CGM data as reviewed above.  She had seen podiatry and had removed callus from the bottom of the right foot.  Has healed  no redness, discharge or tenderness.  REVIEW OF SYSTEMS As per history of present illness.   PAST MEDICAL HISTORY: Past Medical History:  Diagnosis Date   CVA (cerebral vascular accident) (HCC) 2015   Depression    Diabetes (HCC)    t2   Gallstones    Hyperlipemia    Hypertension    Migraines     PAST SURGICAL HISTORY: Past Surgical History:  Procedure Laterality Date   BREAST BIOPSY Right 05/2019   CESAREAN SECTION     x2   CHOLECYSTECTOMY N/A 05/25/2016   Procedure: LAPAROSCOPIC CHOLECYSTECTOMY WITH INTRAOPERATIVE CHOLANGIOGRAM;  Surgeon: Marie Lunger, MD;  Location: [facility];  Service: General;  Laterality: N/A;   DENTAL SURGERY Left 09/09/2022   IR GENERIC HISTORICAL  03/13/2016   IR PERC CHOLECYSTOSTOMY 03/13/2016 Marie Roulette, MD [facility]INTER[facility] RAD   IR GENERIC HISTORICAL  04/28/2016   IR RADIOLOGIST EVAL & MGMT 04/28/2016 Marie Lent, MD [facility] INTER[facility]   LOOP RECORDER IMPLANT N/A 11/29/2013   Procedure: LOOP RECORDER IMPLANT;  Surgeon: Marie LELON Birmingham, MD;  Location: [facility] CATH LAB;  Service: Cardiovascular;  Laterality: N/A;   TEE WITHOUT CARDIOVERSION N/A 08/24/2013   Procedure: TRANSESOPHAGEAL ECHOCARDIOGRAM (TEE);  Surgeon: Marie Balding, MD;  Location[facility] ENDOSCOPY;  Service: Cardiovascular;  Laterality: N/A;     ALLERGIES: Allergies  Allergen Reactions   Penicillins Other (See Comments)    Driving couldn't see and head felt heavy  Has patient had a PCN reaction causing immediate rash, facial/tongue/throat swelling, SOB or lightheadedness with hypotension: No Has patient had a PCN reaction causing severe rash involving mucus membranes or skin necrosis: No Has patient had a PCN reaction that required hospitalization: No Has patient had a PCN reaction occurring within the last 10 years: No If all of the above answers are NO, then may proceed with Cephalosporin use.     FAMILY HISTORY:  Family History  Problem Relation Age of Onset   Stomach cancer Mother    High blood pressure Mother    Diabetes Mother    Diabetes Maternal Grandfather    High blood pressure Maternal Grandfather    Heart disease Neg Hx     SOCIAL HISTORY: Social History   Socioeconomic History   Marital status: Married    Spouse name: Not on file   Number of children: 3   Years of education: MBA   Highest education level: Not on file  Occupational History    Employer: [name]    Comment: [employer]  Tobacco Use   Smoking status: Never    Passive exposure: Never   Smokeless tobacco: Never  Vaping Use   Vaping status: Never Used  Substance and Sexual Activity   Alcohol use: No    Alcohol/week: 1.0 standard drink of alcohol    Types: 1 Standard drinks or equivalent per week    Comment: Social   Drug use: No   Sexual activity: Not on file  Other Topics Concern   Not on file  Social History Narrative   Patient lives at home with her husband (Marie [name]).   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         Social Drivers of Corporate investment banker Strain: Low Risk  (11/02/2022)   Overall Financial Resource Strain (CARDIA)    Difficulty of Paying Living Expenses: Not hard at all  Food Insecurity: No Food Insecurity  (02/26/2023)   Hunger Vital Sign    Worried About Running Out of Food in the Last Year: Never true    Ran Out of Food in the Last Year: Never true  Transportation Needs: No Transportation Needs (07/02/2023)   PRAPARE - Administrator, Civil Service (Medical): No    Lack of Transportation (Non-Medical): No  Physical Activity: Sufficiently Active (11/02/2022)   Exercise Vital Sign    Days of Exercise per Week: 4 days    Minutes of Exercise per Session: 50 min  Stress: No Stress Concern Present (11/02/2022)   [employer] of Occupational Health - Occupational Stress Questionnaire    Feeling of Stress : Only a little  Social Connections: Moderately Isolated (11/02/2022)  Social Connection and Isolation Panel    Frequency of Communication with Friends and Family: More than three times a week    Frequency of Social Gatherings with Friends and Family: Once a week    Attends Religious Services: Never    Database administrator or Organizations: No    Attends Engineer, structural: Never    Marital Status: Married    MEDICATIONS:  Current Outpatient Medications  Medication Sig Dispense Refill   ascorbic acid (VITAMIN C) 500 MG tablet Take 500 mg by mouth daily.     atorvastatin  (LIPITOR) 80 MG tablet TAKE 1 TABLET BY MOUTH ONCE DAILY 90 tablet 3   Cholecalciferol (VITAMIN D3) 3000 units TABS Take 3,000 Units by mouth at bedtime.     Continuous Glucose Sensor (FREESTYLE LIBRE 3 PLUS SENSOR) MISC Change sensor every 15 days. 6 each 3   Continuous Glucose Sensor (FREESTYLE LIBRE 3 SENSOR) MISC Place 1 sensor on the skin every 14 days. Use to check glucose continuously 6 each 3   cyanocobalamin  2000 MCG tablet Take 2,000 mcg by mouth daily.     escitalopram  (LEXAPRO ) 10 MG tablet TAKE 1 TABLET(10 MG) BY MOUTH DAILY 90 tablet 1   escitalopram  (LEXAPRO ) 10 MG tablet Take 1 tablet (10 mg total) by mouth daily. 90 tablet 3   metFORMIN  (GLUCOPHAGE -XR) 500 MG 24 hr tablet TAKE 1  TABLET IN THE MORNING AND 1 tabs  with supper 180 tablet 3   Multiple Vitamin (MULTIVITAMIN WITH MINERALS) TABS tablet Take 1 tablet by mouth daily.     repaglinide  (PRANDIN ) 2 MG tablet TAKE 1 TABLET BEFORE BREAKFAST, TAKE 1 TABLET AT LUNCH, AND TAKE 1 TABLET BEFORE DINNER 270 tablet 3   Safety Seal Miscellaneous MISC Apply 1 Application topically daily. Medication Name: AA Gel 45 g 4   traZODone  (DESYREL ) 50 MG tablet Take 1 tablet (50 mg total) by mouth at bedtime. 90 tablet 3   valsartan  (DIOVAN ) 80 MG tablet Take 1 tablet (80 mg total) by mouth 2 (two) times daily. 180 tablet 1   No current facility-administered medications for this visit.    PHYSICAL EXAM: Vitals:   11/15/23 1328  BP: 120/80  Pulse: 90  Resp: 16  SpO2: 98%  Weight: 135 lb 9.6 oz (61.5 kg)  Height: 5' 1 (1.549 m)   Body mass index is 25.62 kg/m.  Wt Readings from Last 3 Encounters:  11/15/23 135 lb 9.6 oz (61.5 kg)  10/11/23 136 lb 3.2 oz (61.8 kg)  05/03/23 132 lb 12.8 oz (60.2 kg)    General: Well developed, well nourished female in no apparent distress.  HEENT: AT/[place], no external lesions.  Eyes: Conjunctiva clear and no icterus. Neck: Neck supple  Lungs: Respirations not labored Neurologic: Alert, oriented, normal speech Extremities / Skin: Dry. No sores or rashes noted. No acanthosis nigricans Psychiatric: Does not appear depressed or anxious  Diabetic Foot Exam - Simple   No data filed     LABS Reviewed Lab Results  Component Value Date   HGBA1C 6.9 (A) 11/15/2023   HGBA1C 6.6 (A) 05/03/2023   HGBA1C 7.1 (H) 11/18/2022   Lab Results  Component Value Date   FRUCTOSAMINE 246 08/17/2022   FRUCTOSAMINE 262 02/14/2021   FRUCTOSAMINE 260 10/25/2020   Lab Results  Component Value Date   CHOL 131 10/11/2023   HDL 51.80 10/11/2023   LDLCALC 59 10/11/2023   LDLDIRECT 92.0 11/18/2022   TRIG 101.0 10/11/2023   CHOLHDL 3 10/11/2023  Lab Results  Component Value Date   MICRALBCREAT 7  05/03/2023   Lab Results  Component Value Date   CREATININE 0.74 10/11/2023   Lab Results  Component Value Date   GFR 82.58 10/11/2023    ASSESSMENT / PLAN  1. Uncontrolled type 2 diabetes mellitus with hyperglycemia, without long-term current use of insulin  (HCC)     Diabetes Mellitus type 2, complicated by diabetic retinopathy. - Diabetic status / severity: Fair control.  Lab Results  Component Value Date   HGBA1C 6.9 (A) 11/15/2023    - Hemoglobin A1c goal : <6.5%  - Medications: Decreasing repaglinide  to prevent hypoglycemia as follows.  I) metformin  extended release 500 mg 1 tablet with breakfast and 1 tablet with supper. II) repaglinide  2 mg decrease from 2 tablet to 1 tablet with breakfast and restart 1 tablet with lunch when eating and 1 tablet with dinner.  - Home glucose testing: CGM and check as needed.  - Discussed/ Gave Hypoglycemia treatment plan.  # Consult : not required at this time.   # Annual urine for microalbuminuria/ creatinine ratio, no microalbuminuria currently, continue ACE/ARB /losartan. Last  Lab Results  Component Value Date   MICRALBCREAT 7 05/03/2023    # Foot check nightly.  # Annual dilated diabetic eye exams.   - Diet: Make healthy diabetic food choices - Life style / activity / exercise: Discussed.  2. Blood pressure  -  BP Readings from Last 1 Encounters:  11/15/23 120/80    - Control is in target.  - No change in current plans.  3. Lipid status / Hyperlipidemia - Last  Lab Results  Component Value Date   LDLCALC 59 10/11/2023   - Continue atorvastatin  80 mg daily, managed by primary care provider.  Diagnoses and all orders for this visit:  Uncontrolled type 2 diabetes mellitus with hyperglycemia, without long-term current use of insulin  (HCC) -     POCT glycosylated hemoglobin (Hb A1C)    DISPOSITION Follow up in clinic in 3  months suggested.   All questions answered and patient verbalized understanding  of the plan.  Marie Modupe Shampine, MD [facility] Endocrinology [facility] [address] [place], [address] [place], [place] [address] Phone # [phone]  At least part of this note was generated using voice recognition software. Inadvertent word errors may have occurred, which were not recognized during the proofreading process.

## 2023-11-18 ENCOUNTER — Ambulatory Visit: Admitting: Obstetrics and Gynecology

## 2023-11-23 ENCOUNTER — Ambulatory Visit (HOSPITAL_BASED_OUTPATIENT_CLINIC_OR_DEPARTMENT_OTHER): Admitting: Family

## 2023-11-23 ENCOUNTER — Encounter (HOSPITAL_BASED_OUTPATIENT_CLINIC_OR_DEPARTMENT_OTHER): Payer: Self-pay | Admitting: Family

## 2023-11-23 VITALS — BP 132/76 | HR 81 | Resp 16 | Ht 61.0 in | Wt 135.7 lb

## 2023-11-23 DIAGNOSIS — I10 Essential (primary) hypertension: Secondary | ICD-10-CM

## 2023-11-23 DIAGNOSIS — Z8673 Personal history of transient ischemic attack (TIA), and cerebral infarction without residual deficits: Secondary | ICD-10-CM | POA: Diagnosis not present

## 2023-11-23 DIAGNOSIS — R0600 Dyspnea, unspecified: Secondary | ICD-10-CM

## 2023-11-23 DIAGNOSIS — E785 Hyperlipidemia, unspecified: Secondary | ICD-10-CM

## 2023-11-23 NOTE — Progress Notes (Signed)
  Cardiology Office Note:  .   Date:  11/29/2023  ID:  Welby DELENA Roa, DOB 1955/09/17, MRN [id number] PCP: Berneta Elsie Sayre, MD  [facility] Providers Cardiologist:  Shelda Bruckner, MD    History of Present Illness: .   Marie [name] is a 68 y.o. female with history of CVA, hypertension, DM2, hyperlipidemia, carotid stenosis.  Prior CVAs with workup unrevealing.  Loop recorder implanted 2015 with no evidence of arrhythmia.  Last seen 02/23/23 doing well from cardiac perspective and planning a trip to her home country, [place].   Presents today for follow up independently. Pleasant lady who lives with her adult son with Down syndrome.  Discussed the use of AI scribe software for clinical note transcription with the patient, who gave verbal consent to proceed. Reports BP well controlled by home readings. No chest pain. She does note concerns regarding dyspnea and her heart. Reports on occasion feels she has to go outside to get a good, deep breath which occurs at rest or with exertion. No orthponea, PND.   ROS: Please see the history of present illness.    All other systems reviewed and are negative.   Studies Reviewed: .            Risk Assessment/Calculations:            Physical Exam:   VS:  BP 132/76   Pulse 81   Resp 16   Ht 5' 1 (1.549 m)   Wt 135 lb 11.2 oz (61.6 kg)   SpO2 98%   BMI 25.64 kg/m    Wt Readings from Last 3 Encounters:  11/23/23 135 lb 11.2 oz (61.6 kg)  11/15/23 135 lb 9.6 oz (61.5 kg)  10/11/23 136 lb 3.2 oz (61.8 kg)    Vitals:   11/23/23 1525 11/23/23 1543  BP: (!) 142/80 132/76  Pulse: 81   Resp: 16   Height: 5' 1 (1.549 m)   Weight: 135 lb 11.2 oz (61.6 kg)   SpO2: 98%   BMI (Calculated): 25.65     GEN: Well nourished, well developed in no acute distress NECK: No JVD; No carotid bruits CARDIAC: RRR, no murmurs, rubs, gallops RESPIRATORY:  Clear to auscultation without rales, wheezing or rhonchi  ABDOMEN: Soft,  non-tender, non-distended EXTREMITIES:  No edema; No deformity   ASSESSMENT AND PLAN: .       Dyspnea  Reports sensation of needing to go outside for deep breath on occasion at rest or with activity. This is understandably concerning to her. No adventitious lung sounds on exam, no pleural rub. Plan for echocardiogram  Blood Pressure Management Initial blood pressure was slightly elevated, but decreased upon recheck. Still mildly above goal <130/80.  -Continue current blood pressure management with valsartan  80mg  daily -Discussed to monitor BP at home at least 2 hours after medications and sitting for 5-10 minutes.  -Monitor BP at home and report if consistently >130/80     Hx of CVA / HLD, LDL goal < 70 -10/09/23 LDL 59 -Continue present dose Atorvastatin  80mg  daily                   Signed, Reche GORMAN Finder, NP

## 2023-11-23 NOTE — Patient Instructions (Signed)
 Medication Instructions:  Your physician recommends that you continue on your current medications as directed. Please refer to the Current Medication list given to you today.  *If you need a refill on your cardiac medications before your next appointment, please call your pharmacy*  Lab Work: No labs ordered today  If you have labs (blood work) drawn today and your tests are completely normal, you will receive your results only by: MyChart Message (if you have MyChart) OR A paper copy in the mail If you have any lab test that is abnormal or we need to change your treatment, we will call you to review the results.  Testing/Procedures:  Your physician has requested that you have an echocardiogram. Echocardiography is a painless test that uses sound waves to create images of your heart. It provides your doctor with information about the size and shape of your heart and how well your heart's chambers and valves are working. This procedure takes approximately one hour. There are no restrictions for this procedure. Please do NOT wear cologne, perfume, aftershave, or lotions (deodorant is allowed). Please arrive 15 minutes prior to your appointment time.  Please note: We ask at that you not bring children with you during ultrasound (echo/ vascular) testing. Due to room size and safety concerns, children are not allowed in the ultrasound rooms during exams. Our front office staff cannot provide observation of children in our lobby area while testing is being conducted. An adult accompanying a patient to their appointment will only be allowed in the ultrasound room at the discretion of the ultrasound technician under special circumstances. We apologize for any inconvenience.   Follow-Up: At [facility], you and your health needs are our priority.  As part of our continuing mission to provide you with exceptional heart care, our providers are all part of one team.  This team includes your primary  Cardiologist (physician) and Advanced Practice Providers or APPs (Physician Assistants and Nurse Practitioners) who all work together to provide you with the care you need, when you need it.  Your next appointment:   6 month(s)  Provider:   Shelda Bruckner, MD , Edmonia Finder NP    We recommend signing up for the patient portal called MyChart.  Sign up information is provided on this After Visit Summary.  MyChart is used to connect with patients for Virtual Visits (Telemedicine).  Patients are able to view lab/test results, encounter notes, upcoming appointments, etc.  Non-urgent messages can be sent to your provider as well.   To learn more about what you can do with MyChart, go to [email or web address].

## 2023-11-26 ENCOUNTER — Ambulatory Visit: Admitting: Obstetrics and Gynecology

## 2023-12-14 NOTE — Telephone Encounter (Signed)
 Copied from CRM #[id number]. Topic: Clinical - Request for Lab/Test Order >> Dec 14, 2023 11:41 AM Marie [name] wrote: Reason for CRM: Patient would like COVID vaccine prescription

## 2023-12-16 ENCOUNTER — Ambulatory Visit (INDEPENDENT_AMBULATORY_CARE_PROVIDER_SITE_OTHER): Admitting: Obstetrics and Gynecology

## 2023-12-16 VITALS — BP 152/86 | HR 82 | Ht 62.5 in | Wt 139.0 lb

## 2023-12-16 DIAGNOSIS — Z01419 Encounter for gynecological examination (general) (routine) without abnormal findings: Secondary | ICD-10-CM

## 2023-12-16 DIAGNOSIS — Z1231 Encounter for screening mammogram for malignant neoplasm of breast: Secondary | ICD-10-CM | POA: Diagnosis not present

## 2023-12-16 NOTE — Progress Notes (Signed)
 ANNUAL GYNECOLOGY VISIT Chief Complaint  Patient presents with   Annual Exam     Subjective:  Marie [name] is a 68 y.o. [phone] who presents for annual exam.  No concerns  Last pap:  Lab Results  Component Value Date   DIAGPAP  02/15/2019    - Negative for intraepithelial lesion or malignancy (NILM)   HPVHIGH Negative 02/15/2019  History of abnormal pap: No Last mammogram: 2023 Last colonoscopy: cologuard planned in October Last DEXA: 2025   The pregnancy intention screening data noted above was reviewed. Potential methods of contraception were discussed. The patient elected to proceed with No data recorded.       12/16/2023    1:26 PM 10/11/2023   10:06 AM 02/26/2023    9:35 AM 01/26/2023    2:38 PM 11/02/2022    1:10 PM  Depression screen PHQ 2/9  Decreased Interest 0 3 0 0 0  Down, Depressed, Hopeless 0 2 0 0 0  PHQ - 2 Score 0 5 0 0 0  Altered sleeping 2 3   1   Tired, decreased energy 0 2   0  Change in appetite 2 3   0  Feeling bad or failure about yourself  0 2   0  Trouble concentrating 0 2   0  Moving slowly or fidgety/restless 0 3   0  Suicidal thoughts 0 3   0  PHQ-9 Score 4 23   1   Difficult doing work/chores  Very difficult   Not difficult at all        12/16/2023    1:25 PM 10/11/2023   10:06 AM 11/13/2021    2:17 PM 10/24/2020   10:35 AM  GAD 7 : Generalized Anxiety Score  Nervous, Anxious, on Edge 1 1 3 3   Control/stop worrying 0 2 3 3   Worry too much - different things 0 2 3 3   Trouble relaxing 0 3 3 3   Restless 0 2 0 2  Easily annoyed or irritable 0 2 1 3   Afraid - awful might happen 1 3 3 3   Total GAD 7 Score 2 15 16 20   Anxiety Difficulty  Very difficult Somewhat difficult Very difficult      OB History     Gravida  4   Para  4   Term  4   Preterm      AB  0   Living  4      SAB  0   IAB  0   Ectopic  0   Multiple  0   Live Births  4           Past Medical History:  Diagnosis Date   CVA (cerebral  vascular accident) (HCC) 2015   Depression    Diabetes (HCC)    t2   Gallstones    Hyperlipemia    Hypertension    Migraines     Past Surgical History:  Procedure Laterality Date   BREAST BIOPSY Right 05/2019   CESAREAN SECTION     x2   CHOLECYSTECTOMY N/A 05/25/2016   Procedure: LAPAROSCOPIC CHOLECYSTECTOMY WITH INTRAOPERATIVE CHOLANGIOGRAM;  Surgeon: Donnice Lunger, MD;  Location: [facility];  Service: General;  Laterality: N/A;   DENTAL SURGERY Left 09/09/2022   IR GENERIC HISTORICAL  03/13/2016   IR PERC CHOLECYSTOSTOMY 03/13/2016 Marie Roulette, MD [facility]   IR GENERIC HISTORICAL  04/28/2016   IR RADIOLOGIST EVAL & MGMT 04/28/2016 [name], MD [facility]  LOOP RECORDER IMPLANT N/A 11/29/2013   Procedure: LOOP RECORDER IMPLANT;  Surgeon: Danelle LELON Birmingham, MD;  Location: [facility] CATH LAB;  Service: Cardiovascular;  Laterality: N/A;   TEE WITHOUT CARDIOVERSION N/A 08/24/2013   Procedure: TRANSESOPHAGEAL ECHOCARDIOGRAM (TEE);  Surgeon: Jerel Balding, MD;  Location: [facility] ENDOSCOPY;  Service: Cardiovascular;  Laterality: N/A;    Social History   Socioeconomic History   Marital status: Married    Spouse name: Not on file   Number of children: 3   Years of education: MBA   Highest education level: Not on file  Occupational History    Employer: [employer]    Comment: [employer]  Tobacco Use   Smoking status: Never    Passive exposure: Never   Smokeless tobacco: Never  Vaping Use   Vaping status: Never Used  Substance and Sexual Activity   Alcohol use: No    Alcohol/week: 1.0 standard drink of alcohol    Types: 1 Standard drinks or equivalent per week    Comment: Social   Drug use: No   Sexual activity: Not on file  Other Topics Concern   Not on file  Social History Narrative   Patient lives at home with [name].   Patient works full time [employer] education MBA   Right handed   Caffeine two cups daily.         Social  Drivers of Corporate investment banker Strain: Low Risk  (11/02/2022)   Overall Financial Resource Strain (CARDIA)    Difficulty of Paying Living Expenses: Not hard at all  Food Insecurity: No Food Insecurity (02/26/2023)   Hunger Vital Sign    Worried About Running Out of Food in the Last Year: Never true    Ran Out of Food in the Last Year: Never true  Transportation Needs: No Transportation Needs (07/02/2023)   PRAPARE - Administrator, Civil Service (Medical): No    Lack of Transportation (Non-Medical): No  Physical Activity: Sufficiently Active (11/02/2022)   Exercise Vital Sign    Days of Exercise per Week: 4 days    Minutes of Exercise per Session: 50 min  Stress: No Stress Concern Present (11/02/2022)   [employer] of Occupational Health - Occupational Stress Questionnaire    Feeling of Stress : Only a little  Social Connections: Moderately Isolated (11/02/2022)   Social Connection and Isolation Panel    Frequency of Communication with Friends and Family: More than three times a week    Frequency of Social Gatherings with Friends and Family: Once a week    Attends Religious Services: Never    Database administrator or Organizations: No    Attends Engineer, structural: Never    Marital Status: Married    Family History  Problem Relation Age of Onset   Stomach cancer Mother    High blood pressure Mother    Diabetes Mother    Diabetes Maternal Grandfather    High blood pressure Maternal Grandfather    Heart disease Neg Hx     Current Outpatient Medications on File Prior to Visit  Medication Sig Dispense Refill   ascorbic acid (VITAMIN C) 500 MG tablet Take 500 mg by mouth daily.     aspirin  EC 81 MG tablet Take 81 mg by mouth daily. Swallow whole.     atorvastatin  (LIPITOR) 80 MG tablet TAKE 1 TABLET BY MOUTH ONCE DAILY 90 tablet 3   Cholecalciferol (  VITAMIN D3) 3000 units TABS Take 3,000 Units by mouth at bedtime.     Continuous Glucose Sensor  (FREESTYLE LIBRE 3 PLUS SENSOR) MISC Change sensor every 15 days. 6 each 3   Continuous Glucose Sensor (FREESTYLE LIBRE 3 SENSOR) MISC Place 1 sensor on the skin every 14 days. Use to check glucose continuously 6 each 3   cyanocobalamin  2000 MCG tablet Take 2,000 mcg by mouth daily.     escitalopram  (LEXAPRO ) 10 MG tablet Take 1 tablet (10 mg total) by mouth daily. 90 tablet 3   MAGNESIUM GLYCINATE PO Take 200 mg by mouth daily.     metFORMIN  (GLUCOPHAGE -XR) 500 MG 24 hr tablet TAKE 1 TABLET IN THE MORNING AND 1 tabs  with supper 180 tablet 3   Multiple Vitamin (MULTIVITAMIN WITH MINERALS) TABS tablet Take 1 tablet by mouth daily.     repaglinide  (PRANDIN ) 2 MG tablet TAKE 1 TABLET BEFORE BREAKFAST, TAKE 1 TABLET AT LUNCH, AND TAKE 1 TABLET BEFORE DINNER 270 tablet 3   Safety Seal Miscellaneous MISC Apply 1 Application topically daily. Medication Name: AA Gel 45 g 4   valsartan  (DIOVAN ) 80 MG tablet Take 1 tablet (80 mg total) by mouth 2 (two) times daily. 180 tablet 1   No current facility-administered medications on file prior to visit.    Allergies  Allergen Reactions   Penicillins Other (See Comments)    Driving couldn't see and head felt heavy  Has patient had a PCN reaction causing immediate rash, facial/tongue/throat swelling, SOB or lightheadedness with hypotension: No Has patient had a PCN reaction causing severe rash involving mucus membranes or skin necrosis: No Has patient had a PCN reaction that required hospitalization: No Has patient had a PCN reaction occurring within the last 10 years: No If all of the above answers are NO, then may proceed with Cephalosporin use.      Objective:   Vitals:   12/16/23 1314 12/16/23 1327  BP: (!) 158/94 (!) 152/86  Pulse: 88 82  Weight: 139 lb (63 kg)   Height: 5' 2.5 (1.588 m)    Physical Examination:   General appearance - well appearing, and in no distress  Mental status - alert, oriented to person, place, and time  Psych:   normal mood and affect  Skin - warm and dry, normal color, no suspicious lesions noted  Breasts - breasts appear normal, no suspicious masses, no skin or nipple changes or  axillary nodes  Abdomen - soft, nontender, nondistended, no masses or organomegaly  Pelvic -  VULVA: normal appearing vulva with no masses, tenderness or lesions   VAGINA: normal appearing vagina with normal color and discharge, no lesions   CERVIX: normal appearing cervix without discharge or lesions, no CMT  UTERUS: uterus is felt to be normal size, shape, consistency and nontender   ADNEXA: No adnexal masses or tenderness noted.  Extremities:  No swelling or varicosities noted  Chaperone present for exam  Assessment and Plan:  1. Well woman exam with routine gynecological exam (Primary) No pap indicated given age and normal prior screening Mammo Cologuard with primary DEXA up to date   2. Encounter for screening mammogram for malignant neoplasm of breast - MM 3D SCREENING MAMMOGRAM BILATERAL BREAST; Future    Future Appointments  Date Time Provider Department Center  12/29/2023  2:30 PM [facility] [address]  01/10/2024  9:20 AM Berneta Elsie Sayre, MD [facility] [place]  01/10/2024 12:45 PM Alm Delon SAILOR, DO [facility] None  02/18/2024 11:00 AM Thapa, [place], MD [facility] None  03/10/2024  3:40 PM [facility]-ANNUAL WELLNESS VISIT [facility] [place] [facility]  03/13/2024 10:45 AM Alm Delon SAILOR, DO [facility] None    Rollo ONEIDA Bring, MD, FACOG Obstetrician & Gynecologist, [facility] for [facility], [facility]

## 2023-12-16 NOTE — Progress Notes (Signed)
 Pt is due for colorguard, last pap 5 years ago- normal, needs mammogram Pt has had bone density and sees PCP regularly. Pt

## 2023-12-17 ENCOUNTER — Other Ambulatory Visit (HOSPITAL_BASED_OUTPATIENT_CLINIC_OR_DEPARTMENT_OTHER)

## 2023-12-24 ENCOUNTER — Ambulatory Visit
Admission: RE | Admit: 2023-12-24 | Discharge: 2023-12-24 | Disposition: A | Discharge status: Home / Self Care | Source: Ambulatory Visit | Attending: Obstetrics and Gynecology | Admitting: Obstetrics and Gynecology

## 2023-12-24 DIAGNOSIS — Z1231 Encounter for screening mammogram for malignant neoplasm of breast: Secondary | ICD-10-CM

## 2023-12-28 ENCOUNTER — Encounter (HOSPITAL_BASED_OUTPATIENT_CLINIC_OR_DEPARTMENT_OTHER): Payer: Self-pay

## 2023-12-29 ENCOUNTER — Ambulatory Visit (HOSPITAL_BASED_OUTPATIENT_CLINIC_OR_DEPARTMENT_OTHER)

## 2023-12-29 DIAGNOSIS — R0600 Dyspnea, unspecified: Secondary | ICD-10-CM | POA: Diagnosis not present

## 2024-01-02 LAB — ECHOCARDIOGRAM COMPLETE
Area-P 1/2: 3.91 cm2
S' Lateral: 2.15 cm

## 2024-01-03 ENCOUNTER — Ambulatory Visit (HOSPITAL_BASED_OUTPATIENT_CLINIC_OR_DEPARTMENT_OTHER): Payer: Self-pay | Admitting: Family

## 2024-01-03 NOTE — Telephone Encounter (Signed)
 Pt returning call, she said she can't get into her mychart currently

## 2024-01-10 ENCOUNTER — Ambulatory Visit: Admitting: Dermatology

## 2024-01-10 ENCOUNTER — Ambulatory Visit: Admitting: Family Medicine

## 2024-01-10 ENCOUNTER — Encounter: Payer: Self-pay | Admitting: Family Medicine

## 2024-01-10 VITALS — BP 120/72 | HR 71 | Temp 97.6°F | Ht 62.0 in | Wt 136.6 lb

## 2024-01-10 DIAGNOSIS — F341 Dysthymic disorder: Secondary | ICD-10-CM

## 2024-01-10 DIAGNOSIS — E559 Vitamin D deficiency, unspecified: Secondary | ICD-10-CM

## 2024-01-10 DIAGNOSIS — E538 Deficiency of other specified B group vitamins: Secondary | ICD-10-CM

## 2024-01-10 DIAGNOSIS — F419 Anxiety disorder, unspecified: Secondary | ICD-10-CM | POA: Diagnosis not present

## 2024-01-10 MED ORDER — VITAMIN D3 125 MCG (5000 UT) PO CAPS: 5000.0000 [IU] | ORAL_CAPSULE | Freq: Every day | ORAL | 2 refills | Status: DC

## 2024-01-10 MED ORDER — VITAMIN D3 125 MCG (5000 UT) PO CAPS: 5000.0000 [IU] | ORAL_CAPSULE | Freq: Every day | ORAL | 2 refills | Status: AC

## 2024-01-10 MED ORDER — VITAMIN B-12 1000 MCG PO TABS: 1000.0000 ug | ORAL_TABLET | Freq: Every day | ORAL | 2 refills | Status: AC

## 2024-01-10 NOTE — Progress Notes (Signed)
 Established Patient Office Visit   Subjective:  Patient ID: Marie [name], female    DOB: 09/04/1955  Age: 68 y.o. MRN: [id number]  Chief Complaint  Patient presents with   Medical Management of Chronic Issues    3 month follow up. Pt is fasting.     HPI Encounter Diagnoses  Name Primary?   B12 deficiency Yes   Vitamin D  deficiency    Anxiety    Dysthymia    For follow-up of above.  Has not been taking her cyanocobalamin  mean.  She has been taking the 3000 IUs of D3.  Lexapro  continues to work well for her anxiety and dysthymia.  Status post flu vaccine through her pharmacy.   Review of Systems  Constitutional: Negative.   HENT: Negative.    Eyes:  Negative for blurred vision, discharge and redness.  Respiratory: Negative.    Cardiovascular: Negative.   Gastrointestinal:  Negative for abdominal pain.  Genitourinary: Negative.   Musculoskeletal: Negative.  Negative for myalgias.  Skin:  Negative for rash.  Neurological:  Negative for tingling, loss of consciousness and weakness.  Endo/Heme/Allergies:  Negative for polydipsia.  Psychiatric/Behavioral:  Negative for depression. The patient is not nervous/anxious.      Current Outpatient Medications:    ascorbic acid (VITAMIN C) 500 MG tablet, Take 500 mg by mouth daily., Disp: , Rfl:    aspirin  EC 81 MG tablet, Take 81 mg by mouth daily. Swallow whole., Disp: , Rfl:    atorvastatin  (LIPITOR) 80 MG tablet, TAKE 1 TABLET BY MOUTH ONCE DAILY, Disp: 90 tablet, Rfl: 3   Cholecalciferol (VITAMIN D3) 125 MCG (5000 UT) CAPS, Take 1 capsule (5,000 Units total) by mouth daily., Disp: 90 capsule, Rfl: 2   Continuous Glucose Sensor (FREESTYLE LIBRE 3 PLUS SENSOR) MISC, Change sensor every 15 days., Disp: 6 each, Rfl: 3   Continuous Glucose Sensor (FREESTYLE LIBRE 3 SENSOR) MISC, Place 1 sensor on the skin every 14 days. Use to check glucose continuously, Disp: 6 each, Rfl: 3   cyanocobalamin  (VITAMIN B12) 1000 MCG tablet, Take 1  tablet (1,000 mcg total) by mouth daily., Disp: 90 tablet, Rfl: 2   escitalopram  (LEXAPRO ) 10 MG tablet, Take 1 tablet (10 mg total) by mouth daily., Disp: 90 tablet, Rfl: 3   MAGNESIUM GLYCINATE PO, Take 200 mg by mouth daily., Disp: , Rfl:    metFORMIN  (GLUCOPHAGE -XR) 500 MG 24 hr tablet, TAKE 1 TABLET IN THE MORNING AND 1 tabs  with supper, Disp: 180 tablet, Rfl: 3   Multiple Vitamin (MULTIVITAMIN WITH MINERALS) TABS tablet, Take 1 tablet by mouth daily., Disp: , Rfl:    repaglinide  (PRANDIN ) 2 MG tablet, TAKE 1 TABLET BEFORE BREAKFAST, TAKE 1 TABLET AT LUNCH, AND TAKE 1 TABLET BEFORE DINNER, Disp: 270 tablet, Rfl: 3   Safety Seal Miscellaneous MISC, Apply 1 Application topically daily. Medication Name: AA Gel, Disp: 45 g, Rfl: 4   valsartan  (DIOVAN ) 80 MG tablet, Take 1 tablet (80 mg total) by mouth 2 (two) times daily., Disp: 180 tablet, Rfl: 1   Objective:     BP 120/72 (BP Location: Right Arm, Patient Position: Sitting, Cuff Size: Normal)   Pulse 71   Temp 97.6 F (36.4 C) (Temporal)   Ht 5' 2 (1.575 m)   Wt 136 lb 9.6 oz (62 kg)   SpO2 98%   BMI 24.98 kg/m    Physical Exam Constitutional:      General: She is not in acute distress.  Appearance: Normal appearance. She is not ill-appearing, toxic-appearing or diaphoretic.  HENT:     Head: Normocephalic and atraumatic.     Right Ear: External ear normal.     Left Ear: External ear normal.     Mouth/Throat:     Mouth: Mucous membranes are moist.     Pharynx: Oropharynx is clear. No oropharyngeal exudate or posterior oropharyngeal erythema.  Eyes:     General: No scleral icterus.       Right eye: No discharge.        Left eye: No discharge.     Extraocular Movements: Extraocular movements intact.     Conjunctiva/sclera: Conjunctivae normal.     Pupils: Pupils are equal, round, and reactive to light.  Cardiovascular:     Rate and Rhythm: Normal rate and regular rhythm.  Pulmonary:     Effort: Pulmonary effort is  normal. No respiratory distress.     Breath sounds: Normal breath sounds. No wheezing, rhonchi or rales.  Musculoskeletal:     Cervical back: No rigidity or tenderness.  Skin:    General: Skin is warm and dry.  Neurological:     Mental Status: She is alert and oriented to person, place, and time.  Psychiatric:        Mood and Affect: Mood normal.        Behavior: Behavior normal.      No results found for any visits on 01/10/24.    The ASCVD Risk score (Arnett DK, et al., 2019) failed to calculate for the following reasons:   Risk score cannot be calculated because patient has a medical history suggesting prior/existing ASCVD    Assessment & Plan:   B12 deficiency -     Vitamin B-12; Take 1 tablet (1,000 mcg total) by mouth daily.  Dispense: 90 tablet; Refill: 2  Vitamin D  deficiency -     Vitamin D3; Take 1 capsule (5,000 Units total) by mouth daily.  Dispense: 90 capsule; Refill: 2  Anxiety  Dysthymia    Return in about 6 months (around 07/10/2024), or 5000 IUs of vitamin D3 will come from your online pharmacy..  Increased D3-5 1000 units daily.  Rewrote for cyanocobalamine.  Continue Lexapro  for anxiety and dysthymia  Elsie Sim Lent, MD

## 2024-01-12 ENCOUNTER — Telehealth: Payer: Self-pay | Admitting: Family Medicine

## 2024-01-12 NOTE — Telephone Encounter (Unsigned)
 Copied from CRM [id number]. Topic: Clinical - Prescription Issue >> Jan 12, 2024  4:24 PM Alfonso ORN wrote: Reason for CRM: Pt called because pharmacy stated that the D3 vitamin would not be a prescription (they said 3000 is for rx) and is over the counter(5000). Please advise. She also wants to remind preferred pharm is [facility] as she recevied notification from [place] regarding prescription originally

## 2024-01-17 DIAGNOSIS — H2512 Age-related nuclear cataract, left eye: Secondary | ICD-10-CM | POA: Diagnosis not present

## 2024-01-17 DIAGNOSIS — H35371 Puckering of macula, right eye: Secondary | ICD-10-CM | POA: Diagnosis not present

## 2024-01-17 DIAGNOSIS — E113511 Type 2 diabetes mellitus with proliferative diabetic retinopathy with macular edema, right eye: Secondary | ICD-10-CM | POA: Diagnosis not present

## 2024-01-17 DIAGNOSIS — H26491 Other secondary cataract, right eye: Secondary | ICD-10-CM | POA: Diagnosis not present

## 2024-01-17 DIAGNOSIS — H35021 Exudative retinopathy, right eye: Secondary | ICD-10-CM | POA: Diagnosis not present

## 2024-01-17 DIAGNOSIS — E113412 Type 2 diabetes mellitus with severe nonproliferative diabetic retinopathy with macular edema, left eye: Secondary | ICD-10-CM | POA: Diagnosis not present

## 2024-02-04 ENCOUNTER — Other Ambulatory Visit: Payer: Self-pay | Admitting: Endocrinology

## 2024-02-04 DIAGNOSIS — E118 Type 2 diabetes mellitus with unspecified complications: Secondary | ICD-10-CM

## 2024-02-07 NOTE — Telephone Encounter (Signed)
 Copied from CRM [id number]. Topic: General - Other >> Feb 07, 2024 11:23 AM Revonda D wrote: Reason for CRM: Pt stated that she had an appt scheduled for her son through [place] and they stated it was through [facility]. Pt stated that she asked them if they would be able to come to the office for the appt and they informed her that they are unable to do that. Pt stated that she canceled the appt with them and wants to know if Dr.Kremer's nurse is able to give her a callback so she can speak with her regarding this concern.

## 2024-02-16 ENCOUNTER — Telehealth: Payer: Self-pay

## 2024-02-16 NOTE — Telephone Encounter (Signed)
Patient called to verify appt time.

## 2024-02-18 ENCOUNTER — Encounter: Payer: Self-pay | Admitting: Endocrinology

## 2024-02-18 ENCOUNTER — Telehealth: Payer: Self-pay

## 2024-02-18 ENCOUNTER — Ambulatory Visit: Payer: Self-pay | Admitting: Endocrinology

## 2024-02-18 ENCOUNTER — Ambulatory Visit: Admitting: Endocrinology

## 2024-02-18 VITALS — BP 118/80 | HR 74 | Resp 16 | Ht 62.0 in | Wt 137.4 lb

## 2024-02-18 DIAGNOSIS — E1165 Type 2 diabetes mellitus with hyperglycemia: Secondary | ICD-10-CM | POA: Diagnosis not present

## 2024-02-18 DIAGNOSIS — Z7984 Long term (current) use of oral hypoglycemic drugs: Secondary | ICD-10-CM

## 2024-02-18 LAB — POCT GLYCOSYLATED HEMOGLOBIN (HGB A1C): Hemoglobin A1C: 7.2 % — AB (ref 4.0–5.6)

## 2024-02-18 NOTE — Telephone Encounter (Signed)
 Patient called with f/u question r/g today's office visit medication changes. VM left to give patient clarification

## 2024-02-18 NOTE — Progress Notes (Signed)
 Outpatient Endocrinology Note Brysen Shankman, MD   Patient's Name: Marie [name]    DOB: 07-15-1955    MRN: [id number]                                                    REASON OF VISIT: Follow up for type 2 diabetes mellitus  PCP: Berneta Elsie Sayre, MD  HISTORY OF PRESENT ILLNESS:   Marie [name] is a 68 y.o. old female with past medical history listed below, is here for follow up for type 2 diabetes mellitus.   Pertinent Diabetes History: Patient was previously seen by Dr. Von and was last time seen in August 2024.  Patient was diagnosed with type 2 diabetes mellitus in 2010.  Patient has relatively controlled type 2 diabetes mellitus.  Chronic Diabetes Complications : Retinopathy: yes, proliferative retinopathy. Last ophthalmology exam was done on annually, following with ophthalmology / retinal surgery regularly.  Nephropathy: no, on ACE/ARB /valsartan . Peripheral neuropathy: ?,  Callus present, following with podiatry. Coronary artery disease: no Stroke: yes  Relevant comorbidities and cardiovascular risk factors: Obesity: on Body mass index is 25.13 kg/m.  Hypertension: Yes  Hyperlipidemia : Yes, on statin   Current / Home Diabetic regimen includes:  Non-insulin  hypoglycemic drugs the patient is taking are: Metformin  extended release 500 mg 2 times a day, Prandin  4-2-4 mg before meals.   Prior diabetic medications: Farxiga  stopped due to cost.  She had taken Januvia , Rybelsus  in the past.  Glycemic data:    CONTINUOUS GLUCOSE MONITORING SYSTEM (CGMS) INTERPRETATION: At today's visit, we reviewed CGM downloads. The full report is scanned in the media. Reviewing the CGM trends, blood glucose are as follows:  FreeStyle Libre 3+ CGM-  Sensor Download (Sensor download was reviewed and summarized below.) Dates: November 1 to November 14 , 2025, 14 days     Interpretation: Occasional hypoglycemia with blood sugar in 60s and sometimes up to 50s in between the meals  following hyperglycemia.  Most of the other times acceptable blood sugar.  Rare hyperglycemia with blood sugar up to 250 related to high carb meal.  Hypoglycemia: Patient has minor hypoglycemic episodes. Patient has hypoglycemia awareness.  Factors modifying glucose control: 1.  Diabetic diet assessment: 3 meals a day.  2.  Staying active or exercising:   3.  Medication compliance: compliant all of the time.  Interval history  CGM data as reviewed above.  Hemoglobin A1c 7.2%.  She has occasional hypoglycemia seems to be related to using repaglinide .  She takes 2 tablets of repaglinide  for breakfast and supper and 1 tablet for lunch when she eats lunch.  She has no other complaints today.  REVIEW OF SYSTEMS As per history of present illness.   PAST MEDICAL HISTORY: Past Medical History:  Diagnosis Date   CVA (cerebral vascular accident) (HCC) 2015   Depression    Diabetes (HCC)    t2   Gallstones    Hyperlipemia    Hypertension    Migraines     PAST SURGICAL HISTORY: Past Surgical History:  Procedure Laterality Date   BREAST BIOPSY Right 05/2019   CESAREAN SECTION     x2   CHOLECYSTECTOMY N/A 05/25/2016   Procedure: LAPAROSCOPIC CHOLECYSTECTOMY WITH INTRAOPERATIVE CHOLANGIOGRAM;  Surgeon: Donnice Lunger, MD;  Location: [facility];  Service: General;  Laterality: N/A;   DENTAL  SURGERY Left 09/09/2022   IR GENERIC HISTORICAL  03/13/2016   IR PERC CHOLECYSTOSTOMY 03/13/2016 [name], MD [facility]   IR GENERIC HISTORICAL  04/28/2016   IR RADIOLOGIST EVAL & MGMT 04/28/2016 [name], MD [facility] INTER[facility] RAD   LOOP RECORDER IMPLANT N/A 11/29/2013   Procedure: LOOP RECORDER IMPLANT;  Surgeon: Danelle LELON Birmingham, MD;  Location: [facility] CATH LAB;  Service: Cardiovascular;  Laterality: N/A;   TEE WITHOUT CARDIOVERSION N/A 08/24/2013   Procedure: TRANSESOPHAGEAL ECHOCARDIOGRAM (TEE);  Surgeon: Jerel Balding, MD;  Location[facility] ENDOSCOPY;  Service: Cardiovascular;  Laterality: N/A;     ALLERGIES: Allergies  Allergen Reactions   Penicillins Other (See Comments)    Driving couldn't see and head felt heavy  Has patient had a PCN reaction causing immediate rash, facial/tongue/throat swelling, SOB or lightheadedness with hypotension: No Has patient had a PCN reaction causing severe rash involving mucus membranes or skin necrosis: No Has patient had a PCN reaction that required hospitalization: No Has patient had a PCN reaction occurring within the last 10 years: No If all of the above answers are NO, then may proceed with Cephalosporin use.     FAMILY HISTORY:  Family History  Problem Relation Age of Onset   Stomach cancer Mother    High blood pressure Mother    Diabetes Mother    Diabetes Maternal Grandfather    High blood pressure Maternal Grandfather    Heart disease Neg Hx     SOCIAL HISTORY: Social History   Socioeconomic History   Marital status: Married    Spouse name: Not on file   Number of children: 3   Years of education: MBA   Highest education level: Not on file  Occupational History    Employer: [employer]    Comment: [employer]  Tobacco Use   Smoking status: Never    Passive exposure: Never   Smokeless tobacco: Never  Vaping Use   Vaping status: Never Used  Substance and Sexual Activity   Alcohol use: No    Alcohol/week: 1.0 standard drink of alcohol    Types: 1 Standard drinks or equivalent per week    Comment: Social   Drug use: No   Sexual activity: Not on file  Other Topics Concern   Not on file  Social History Narrative   Patient lives at home with [name].   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         Social Drivers of Corporate Investment Banker Strain: Low Risk  (11/02/2022)   Overall Financial Resource Strain (CARDIA)    Difficulty of Paying Living Expenses: Not hard at all  Food Insecurity: No Food Insecurity  (02/26/2023)   Hunger Vital Sign    Worried About Running Out of Food in the Last Year: Never true    Ran Out of Food in the Last Year: Never true  Transportation Needs: No Transportation Needs (07/02/2023)   PRAPARE - Administrator, Civil Service (Medical): No    Lack of Transportation (Non-Medical): No  Physical Activity: Sufficiently Active (11/02/2022)   Exercise Vital Sign    Days of Exercise per Week: 4 days    Minutes of Exercise per Session: 50 min  Stress: No Stress Concern Present (11/02/2022)   [employer] of Occupational Health - Occupational Stress Questionnaire    Feeling of Stress : Only a  little  Social Connections: Moderately Isolated (11/02/2022)   Social Connection and Isolation Panel    Frequency of Communication with Friends and Family: More than three times a week    Frequency of Social Gatherings with Friends and Family: Once a week    Attends Religious Services: Never    Database Administrator or Organizations: No    Attends Engineer, Structural: Never    Marital Status: Married    MEDICATIONS:  Current Outpatient Medications  Medication Sig Dispense Refill   ascorbic acid (VITAMIN C) 500 MG tablet Take 500 mg by mouth daily.     aspirin  EC 81 MG tablet Take 81 mg by mouth daily. Swallow whole.     atorvastatin  (LIPITOR) 80 MG tablet TAKE 1 TABLET BY MOUTH ONCE DAILY 90 tablet 3   Cholecalciferol (VITAMIN D3) 125 MCG (5000 UT) CAPS Take 1 capsule (5,000 Units total) by mouth daily. 90 capsule 2   Continuous Glucose Sensor (FREESTYLE LIBRE 3 PLUS SENSOR) MISC Change sensor every 15 days. 6 each 3   Continuous Glucose Sensor (FREESTYLE LIBRE 3 SENSOR) MISC Place 1 sensor on the skin every 14 days. Use to check glucose continuously 6 each 3   cyanocobalamin  (VITAMIN B12) 1000 MCG tablet Take 1 tablet (1,000 mcg total) by mouth daily. 90 tablet 2   escitalopram  (LEXAPRO ) 10 MG tablet Take 1 tablet (10 mg total) by mouth daily. 90 tablet 3    MAGNESIUM GLYCINATE PO Take 200 mg by mouth daily.     metFORMIN  (GLUCOPHAGE -XR) 500 MG 24 hr tablet TAKE 1 TABLET IN THE MORNING AND 1 tabs  with supper 180 tablet 3   Multiple Vitamin (MULTIVITAMIN WITH MINERALS) TABS tablet Take 1 tablet by mouth daily.     repaglinide  (PRANDIN ) 2 MG tablet TAKE 2 TABLETS BY MOUTH BEFORE BREAKFAST, 1 TABLET AT LUNCH, AND 1 TABLET BEFORE DINNER 360 tablet 0   Safety Seal Miscellaneous MISC Apply 1 Application topically daily. Medication Name: AA Gel 45 g 4   valsartan  (DIOVAN ) 80 MG tablet Take 1 tablet (80 mg total) by mouth 2 (two) times daily. 180 tablet 1   No current facility-administered medications for this visit.    PHYSICAL EXAM: Vitals:   02/18/24 1119  BP: 118/80  Pulse: 74  Resp: 16  SpO2: 96%  Weight: 137 lb 6.4 oz (62.3 kg)  Height: 5' 2 (1.575 m)    Body mass index is 25.13 kg/m.  Wt Readings from Last 3 Encounters:  02/18/24 137 lb 6.4 oz (62.3 kg)  01/10/24 136 lb 9.6 oz (62 kg)  12/16/23 139 lb (63 kg)    General: Well developed, well nourished female in no apparent distress.  HEENT: AT/[place], no external lesions.  Eyes: Conjunctiva clear and no icterus. Neck: Neck supple  Lungs: Respirations not labored Neurologic: Alert, oriented, normal speech Extremities / Skin: Dry.  Psychiatric: Does not appear depressed or anxious  Diabetic Foot Exam - Simple   No data filed     LABS Reviewed Lab Results  Component Value Date   HGBA1C 7.2 (A) 02/18/2024   HGBA1C 6.9 (A) 11/15/2023   HGBA1C 6.6 (A) 05/03/2023   Lab Results  Component Value Date   FRUCTOSAMINE 246 08/17/2022   FRUCTOSAMINE 262 02/14/2021   FRUCTOSAMINE 260 10/25/2020   Lab Results  Component Value Date   CHOL 131 10/11/2023   HDL 51.80 10/11/2023   LDLCALC 59 10/11/2023   LDLDIRECT 92.0 11/18/2022   TRIG 101.0 10/11/2023  CHOLHDL 3 10/11/2023   Lab Results  Component Value Date   MICRALBCREAT 7 05/03/2023   Lab Results  Component Value  Date   CREATININE 0.74 10/11/2023   Lab Results  Component Value Date   GFR 82.58 10/11/2023    ASSESSMENT / PLAN  1. Uncontrolled type 2 diabetes mellitus with hyperglycemia, without long-term current use of insulin  (HCC)     Diabetes Mellitus type 2, complicated by diabetic retinopathy. - Diabetic status / severity: Fair control.  Lab Results  Component Value Date   HGBA1C 7.2 (A) 02/18/2024    - Hemoglobin A1c goal : <6.5%  Occasional hypoglycemia in between the meals likely related to using repaglinide .  - Medications: Decreasing repaglinide  to prevent hypoglycemia as follows.  I) metformin  extended release 500 mg 1 tablet with breakfast and 1 tablet with supper. II) Repaglinide  2 mg tablet decrease from 2 tablet to 1 tablet with breakfast and restart 1 tablet with lunch when eating and decrease from 2 to 1 tablet with dinner.  Advised patient to take repaglinide  2 mg 1 tablet with meals up to 3 times a day.  - Home glucose testing: CGM and check as needed.  - Discussed/ Gave Hypoglycemia treatment plan.  # Consult : not required at this time.   # Annual urine for microalbuminuria/ creatinine ratio, no microalbuminuria currently, continue ACE/ARB /losartan. Last  Lab Results  Component Value Date   MICRALBCREAT 7 05/03/2023    # Foot check nightly.  # Annual dilated diabetic eye exams.   - Diet: Make healthy diabetic food choices - Life style / activity / exercise: Discussed.  2. Blood pressure  -  BP Readings from Last 1 Encounters:  02/18/24 118/80    - Control is in target.  - No change in current plans.  3. Lipid status / Hyperlipidemia - Last  Lab Results  Component Value Date   LDLCALC 59 10/11/2023   - Continue atorvastatin  80 mg daily, managed by primary care provider.  Diagnoses and all orders for this visit:  Uncontrolled type 2 diabetes mellitus with hyperglycemia, without long-term current use of insulin  (HCC) -     POCT  glycosylated hemoglobin (Hb A1C)     DISPOSITION Follow up in clinic in 3  months suggested.  Labs on the same day of the visit.   All questions answered and patient verbalized understanding of the plan.  Jakorian Marengo, MD [facility] Endocrinology [facility] [address] [place], [address] [place], [place] [address] Phone # [phone]  At least part of this note was generated using voice recognition software. Inadvertent word errors may have occurred, which were not recognized during the proofreading process.

## 2024-02-18 NOTE — Patient Instructions (Signed)
   Take metformin  1 tab  with breakfast and 1 tab with supper.  Repaglinide  1 tab with breakfast, 1 tab lunch and 1 tab dinner.

## 2024-02-22 ENCOUNTER — Telehealth: Payer: Self-pay

## 2024-02-22 NOTE — Progress Notes (Signed)
   02/22/2024  Patient ID: Marie [name], female   DOB: Jul 13, 1955, 68 y.o.   MRN: [id number]  This patient is appearing on a report for being at risk of failing the adherence measure for cholesterol (statin) medications this calendar year.   Medication: atorvastatin  80mg  daily Last fill date: 10/01/23 for 90 day supply  Left voicemail for patient to return my call at their convenience.  Marie [name], PharmD, DPLA

## 2024-03-10 ENCOUNTER — Ambulatory Visit

## 2024-03-10 DIAGNOSIS — Z0000 Encounter for general adult medical examination without abnormal findings: Secondary | ICD-10-CM | POA: Diagnosis not present

## 2024-03-10 NOTE — Progress Notes (Signed)
 Chief Complaint  Patient presents with   Medicare Wellness     Subjective:   Marie [name] is a 68 y.o. female who presents for a Medicare Annual Wellness Visit.  Visit info / Clinical Intake: Medicare Wellness Visit Type:: Subsequent Annual Wellness Visit Persons participating in visit and providing information:: patient Medicare Wellness Visit Mode:: Telephone If telephone:: video declined Since this visit was completed virtually, some vitals may be partially provided or unavailable. Missing vitals are due to the limitations of the virtual format.: Unable to obtain vitals - no equipment If Telephone or Video please confirm:: I connected with patient using audio/video enable telemedicine. I verified patient identity with two identifiers, discussed telehealth limitations, and patient agreed to proceed. Patient Location:: home Provider Location:: office Interpreter Needed?: No Pre-visit prep was completed: yes AWV questionnaire completed by patient prior to visit?: no Living arrangements:: with family/others Patient's Overall Health Status Rating: very good Typical amount of pain: some Does pain affect daily life?: no Are you currently prescribed opioids?: no  Dietary Habits and Nutritional Risks How many meals a day?: 3 Eats fruit and vegetables daily?: yes Most meals are obtained by: preparing own meals In the last 2 weeks, have you had any of the following?: none Diabetic:: (!) yes Any non-healing wounds?: no How often do you check your BS?: continuous glucose monitor Would you like to be referred to a Nutritionist or for Diabetic Management? : no  Functional Status Activities of Daily Living (to include ambulation/medication): Independent Ambulation: Independent Medication Administration: Independent Home Management (perform basic housework or laundry): Independent Manage your own finances?: yes Primary transportation is: facility / other Concerns about vision?: no  *vision screening is required for WTM* Concerns about hearing?: no  Fall Screening Falls in the past year?: 0 Number of falls in past year: 0 Was there an injury with Fall?: 0 Fall Risk Category Calculator: 0 Patient Fall Risk Level: Low Fall Risk  Fall Risk Patient at Risk for Falls Due to: Medication side effect Fall risk Follow up: Falls prevention discussed; Falls evaluation completed  Home and Transportation Safety: All rugs have non-skid backing?: yes All stairs or steps have railings?: yes Grab bars in the bathtub or shower?: yes Have non-skid surface in bathtub or shower?: yes Good home lighting?: yes Regular seat belt use?: yes Hospital stays in the last year:: no  Cognitive Assessment Difficulty concentrating, remembering, or making decisions? : no Will 6CIT or Mini Cog be Completed: yes What year is it?: 0 points What month is it?: 0 points Give patient an address phrase to remember (5 components): [address] About what time is it?: 0 points Count backwards from 20 to 1: 0 points Say the months of the year in reverse: 0 points Repeat the address phrase from earlier: 0 points 6 CIT Score: 0 points  Advance Directives (For Healthcare) Does Patient Have a Medical Advance Directive?: No Would patient like information on creating a medical advance directive?: Yes (MAU/Ambulatory/Procedural Areas - Information given)  Reviewed/Updated  Reviewed/Updated: Reviewed All (Medical, Surgical, Family, Medications, Allergies, Care Teams, Patient Goals)    Allergies (verified) Penicillins   Current Medications (verified) Outpatient Encounter Medications as of 03/10/2024  Medication Sig   ascorbic acid (VITAMIN C) 500 MG tablet Take 500 mg by mouth daily.   aspirin  EC 81 MG tablet Take 81 mg by mouth daily. Swallow whole.   atorvastatin  (LIPITOR) 80 MG tablet TAKE 1 TABLET BY MOUTH ONCE DAILY   Cholecalciferol (  VITAMIN D3) 125 MCG (5000 UT) CAPS Take 1 capsule  (5,000 Units total) by mouth daily.   Continuous Glucose Sensor (FREESTYLE LIBRE 3 PLUS SENSOR) MISC Change sensor every 15 days.   Continuous Glucose Sensor (FREESTYLE LIBRE 3 SENSOR) MISC Place 1 sensor on the skin every 14 days. Use to check glucose continuously   cyanocobalamin  (VITAMIN B12) 1000 MCG tablet Take 1 tablet (1,000 mcg total) by mouth daily.   escitalopram  (LEXAPRO ) 10 MG tablet Take 1 tablet (10 mg total) by mouth daily.   MAGNESIUM GLYCINATE PO Take 200 mg by mouth daily.   metFORMIN  (GLUCOPHAGE -XR) 500 MG 24 hr tablet TAKE 1 TABLET IN THE MORNING AND 1 tabs  with supper   Multiple Vitamin (MULTIVITAMIN WITH MINERALS) TABS tablet Take 1 tablet by mouth daily.   repaglinide  (PRANDIN ) 2 MG tablet TAKE 2 TABLETS BY MOUTH BEFORE BREAKFAST, 1 TABLET AT LUNCH, AND 1 TABLET BEFORE DINNER   Safety Seal Miscellaneous MISC Apply 1 Application topically daily. Medication Name: AA Gel   valsartan  (DIOVAN ) 80 MG tablet Take 1 tablet (80 mg total) by mouth 2 (two) times daily.   No facility-administered encounter medications on file as of 03/10/2024.    History: Past Medical History:  Diagnosis Date   CVA (cerebral vascular accident) (HCC) 2015   Depression    Diabetes (HCC)    t2   Gallstones    Hyperlipemia    Hypertension    Migraines    Past Surgical History:  Procedure Laterality Date   BREAST BIOPSY Right 05/2019   CESAREAN SECTION     x2   CHOLECYSTECTOMY N/A 05/25/2016   Procedure: LAPAROSCOPIC CHOLECYSTECTOMY WITH INTRAOPERATIVE CHOLANGIOGRAM;  Surgeon: Donnice Lunger, MD;  Location: [facility];  Service: General;  Laterality: N/A;   DENTAL SURGERY Left 09/09/2022   IR GENERIC HISTORICAL  03/13/2016   IR PERC CHOLECYSTOSTOMY 03/13/2016 [name], MD [facility]   IR GENERIC HISTORICAL  04/28/2016   IR RADIOLOGIST EVAL & MGMT 04/28/2016 [name], MD [facility]   LOOP RECORDER IMPLANT N/A 11/29/2013   Procedure: LOOP RECORDER IMPLANT;  Surgeon: Danelle LELON Birmingham, MD;  Location[facility] CATH LAB;  Service: Cardiovascular;  Laterality: N/A;   TEE WITHOUT CARDIOVERSION N/A 08/24/2013   Procedure: TRANSESOPHAGEAL ECHOCARDIOGRAM (TEE);  Surgeon: Jerel Balding, MD;  Location: [facility] ENDOSCOPY;  Service: Cardiovascular;  Laterality: N/A;   Family History  Problem Relation Age of Onset   Stomach cancer Mother    High blood pressure Mother    Diabetes Mother    Diabetes Maternal Grandfather    High blood pressure Maternal Grandfather    Heart disease Neg Hx    Social History   Occupational History    Employer: [employer]    Comment: [employer]  Tobacco Use   Smoking status: Never    Passive exposure: Never   Smokeless tobacco: Never  Vaping Use   Vaping status: Never Used  Substance and Sexual Activity   Alcohol use: No    Alcohol/week: 1.0 standard drink of alcohol    Types: 1 Standard drinks or equivalent per week    Comment: Social   Drug use: No   Sexual activity: Not on file   Tobacco Counseling Counseling given: Not Answered  SDOH Screenings   Food Insecurity: No Food Insecurity (03/10/2024)  Housing: Unknown (03/10/2024)  Transportation Needs: No Transportation Needs (03/10/2024)  Utilities: Not At Risk (03/10/2024)  Alcohol Screen: Low Risk  (03/10/2024)  Depression (PHQ2-9): Low Risk  (  03/10/2024)  Financial Resource Strain: Low Risk  (03/10/2024)  Physical Activity: Insufficiently Active (03/10/2024)  Social Connections: Moderately Integrated (03/10/2024)  Stress: No Stress Concern Present (03/10/2024)  Tobacco Use: Low Risk  (03/10/2024)  Health Literacy: Adequate Health Literacy (03/10/2024)   See flowsheets for full screening details  Depression Screen PHQ 2 & 9 Depression Scale- Over the past 2 weeks, how often have you been bothered by any of the following problems? Little interest or pleasure in doing things: 0 Feeling down, depressed, or hopeless (PHQ Adolescent also includes...irritable): 0 PHQ-2 Total Score: 0 Trouble  falling or staying asleep, or sleeping too much: 1 Feeling tired or having little energy: 0 Poor appetite or overeating (PHQ Adolescent also includes...weight loss): 0 Feeling bad about yourself - or that you are a failure or have let yourself or your family down: 0 Trouble concentrating on things, such as reading the newspaper or watching television (PHQ Adolescent also includes...like school work): 0 Moving or speaking so slowly that other people could have noticed. Or the opposite - being so fidgety or restless that you have been moving around a lot more than usual: 0 Thoughts that you would be better off dead, or of hurting yourself in some way: 0 PHQ-9 Total Score: 1 If you checked off any problems, how difficult have these problems made it for you to do your work, take care of things at home, or get along with other people?: Not difficult at all  Depression Treatment Depression Interventions/Treatment : EYV7-0 Score <4 Follow-up Not Indicated     Goals Addressed             This Visit's Progress    Patient Stated       03/10/2024, wants to lower A1C             Objective:    Today's Vitals   There is no height or weight on file to calculate BMI.  Hearing/Vision screen Hearing Screening - Comments:: Denies hearing issues Vision Screening - Comments:: Regular eye exams, Dr. Abigail Immunizations and Health Maintenance Health Maintenance  Topic Date Due   Diabetic kidney evaluation - Urine ACR  05/02/2024   HEMOGLOBIN A1C  08/17/2024   OPHTHALMOLOGY EXAM  09/26/2024   Diabetic kidney evaluation - eGFR measurement  10/10/2024   FOOT EXAM  10/10/2024   Medicare Annual Wellness (AWV)  03/10/2025   Mammogram  12/23/2025   Colonoscopy  01/14/2028   DTaP/Tdap/Td (2 - Td or Tdap) 12/26/2029   Pneumococcal Vaccine: 50+ Years  Completed   Influenza Vaccine  Completed   Bone Density Scan  Completed   Hepatitis C Screening  Completed   Zoster Vaccines- Shingrix  Completed    Meningococcal B Vaccine  Aged Out   COVID-19 Vaccine  Discontinued        Assessment/Plan:  This is a routine wellness examination for Marie [name].  Patient Care Team: Berneta Elsie Sayre, MD as PCP - General (Family Medicine) Lonni Slain, MD as PCP - Cardiology (Cardiology) Onita Duos, MD as Consulting Physician (Neurology) Abigail, Maude POUR [facility])  I have personally reviewed and noted the following in the patient's chart:   Medical and social history Use of alcohol, tobacco or illicit drugs  Current medications and supplements including opioid prescriptions. Functional ability and status Nutritional status Physical activity Advanced directives List of other physicians Hospitalizations, surgeries, and ER visits in previous 12 months Vitals Screenings to include cognitive, depression, and falls Referrals and appointments  No orders of the defined  types were placed in this encounter.  In addition, I have reviewed and discussed with patient certain preventive protocols, quality metrics, and best practice recommendations. A written personalized care plan for preventive services as well as general preventive health recommendations were provided to patient.   Marie [name] Dawn, LPN   [id number]/[id number]   Return in 1 year (on 03/10/2025).  After Visit Summary: (MyChart) Due to this being a telephonic visit, the after visit summary with patients personalized plan was offered to patient via MyChart   Nurse Notes: none

## 2024-03-13 ENCOUNTER — Ambulatory Visit: Admitting: Dermatology

## 2024-03-13 NOTE — Patient Instructions (Signed)
 Marie [name],  Thank you for taking the time for your Medicare Wellness Visit. I appreciate your continued commitment to your health goals. Please review the care plan we discussed, and feel free to reach out if I can assist you further.  Please note that Annual Wellness Visits do not include a physical exam. Some assessments may be limited, especially if the visit was conducted virtually. If needed, we may recommend an in-person follow-up with your provider.  Ongoing Care Seeing your primary care provider every 3 to 6 months helps us  monitor your health and provide consistent, personalized care.   Referrals If a referral was made during today's visit and you haven't received any updates within two weeks, please contact the referred provider directly to check on the status.  Recommended Screenings:  Health Maintenance  Topic Date Due   Yearly kidney health urinalysis for diabetes  05/02/2024   Hemoglobin A1C  08/17/2024   Eye exam for diabetics  09/26/2024   Yearly kidney function blood test for diabetes  10/10/2024   Complete foot exam   10/10/2024   Medicare Annual Wellness Visit  03/10/2025   Breast Cancer Screening  12/23/2025   Colon Cancer Screening  01/14/2028   DTaP/Tdap/Td vaccine (2 - Td or Tdap) 12/26/2029   Pneumococcal Vaccine for age over 36  Completed   Flu Shot  Completed   Osteoporosis screening with Bone Density Scan  Completed   Hepatitis C Screening  Completed   Zoster (Shingles) Vaccine  Completed   Meningitis B Vaccine  Aged Out   COVID-19 Vaccine  Discontinued       03/10/2024    3:42 PM  Advanced Directives  Does Patient Have a Medical Advance Directive? No  Would patient like information on creating a medical advance directive? Yes (MAU/Ambulatory/Procedural Areas - Information given)    Vision: Annual vision screenings are recommended for early detection of glaucoma, cataracts, and diabetic retinopathy. These exams can also reveal signs of chronic  conditions such as diabetes and high blood pressure.  Dental: Annual dental screenings help detect early signs of oral cancer, gum disease, and other conditions linked to overall health, including heart disease and diabetes.  Please see the attached documents for additional preventive care recommendations.

## 2024-03-22 NOTE — Progress Notes (Signed)
 Pharmacy Quality Measure Review  This patient is appearing on a report for being at risk of failing the adherence measure for diabetes medications this calendar year.   Medication: metformin  500 mg Last fill date: 03/09/24 for 90 day supply  Insurance report was not up to date. No action needed at this time.   Jenkins Graces, PharmD PGY1 Pharmacy Resident

## 2024-05-08 ENCOUNTER — Telehealth: Payer: Self-pay

## 2024-05-22 ENCOUNTER — Other Ambulatory Visit

## 2024-05-22 ENCOUNTER — Ambulatory Visit: Admitting: Endocrinology

## 2024-06-19 ENCOUNTER — Ambulatory Visit (HOSPITAL_BASED_OUTPATIENT_CLINIC_OR_DEPARTMENT_OTHER): Admitting: Cardiology

## 2024-07-10 ENCOUNTER — Ambulatory Visit: Admitting: Family Medicine

## 2025-03-13 ENCOUNTER — Ambulatory Visit
